# Patient Record
Sex: Female | Born: 1945
Health system: Southern US, Community
[De-identification: ages and names within clinical notes are randomized; demographics above are authoritative.]

## PROBLEM LIST (undated history)

## (undated) DIAGNOSIS — E785 Hyperlipidemia, unspecified: Secondary | ICD-10-CM

## (undated) DIAGNOSIS — J302 Other seasonal allergic rhinitis: Secondary | ICD-10-CM

## (undated) DIAGNOSIS — K219 Gastro-esophageal reflux disease without esophagitis: Secondary | ICD-10-CM

## (undated) DIAGNOSIS — R5383 Other fatigue: Secondary | ICD-10-CM

## (undated) DIAGNOSIS — R519 Headache, unspecified: Secondary | ICD-10-CM

## (undated) DIAGNOSIS — Z87898 Personal history of other specified conditions: Secondary | ICD-10-CM

## (undated) DIAGNOSIS — R51 Headache: Secondary | ICD-10-CM

## (undated) DIAGNOSIS — IMO0001 Reserved for inherently not codable concepts without codable children: Secondary | ICD-10-CM

## (undated) DIAGNOSIS — R197 Diarrhea, unspecified: Secondary | ICD-10-CM

## (undated) DIAGNOSIS — T753XXA Motion sickness, initial encounter: Secondary | ICD-10-CM

## (undated) DIAGNOSIS — I10 Essential (primary) hypertension: Secondary | ICD-10-CM

## (undated) DIAGNOSIS — M1990 Unspecified osteoarthritis, unspecified site: Secondary | ICD-10-CM

## (undated) DIAGNOSIS — C801 Malignant (primary) neoplasm, unspecified: Secondary | ICD-10-CM

## (undated) HISTORY — DX: Personal history of other specified conditions: Z87.898

## (undated) HISTORY — PX: EYE SURGERY: SHX253

## (undated) HISTORY — PX: ABDOMINAL HYSTERECTOMY: SHX81

## (undated) HISTORY — PX: VESICOVAGINAL FISTULA CLOSURE W/ TAH: SUR271

## (undated) HISTORY — PX: BREAST BIOPSY: SHX20

## (undated) HISTORY — PX: CARPAL TUNNEL RELEASE: SHX101

## (undated) HISTORY — DX: Hyperlipidemia, unspecified: E78.5

## (undated) HISTORY — PX: CHOLECYSTECTOMY: SHX55

## (undated) HISTORY — DX: Gastro-esophageal reflux disease without esophagitis: K21.9

## (undated) HISTORY — PX: BREAST EXCISIONAL BIOPSY: SUR124

## (undated) HISTORY — DX: Essential (primary) hypertension: I10

## (undated) HISTORY — PX: SKIN CANCER EXCISION: SHX779

---

## 2004-08-10 ENCOUNTER — Ambulatory Visit: Payer: Self-pay | Admitting: General Surgery

## 2004-09-14 ENCOUNTER — Ambulatory Visit: Payer: Self-pay | Admitting: General Surgery

## 2005-04-01 ENCOUNTER — Ambulatory Visit: Payer: Self-pay | Admitting: General Surgery

## 2006-04-10 ENCOUNTER — Ambulatory Visit: Payer: Self-pay | Admitting: General Surgery

## 2007-04-16 ENCOUNTER — Ambulatory Visit: Payer: Self-pay | Admitting: General Surgery

## 2007-10-12 ENCOUNTER — Ambulatory Visit: Payer: Self-pay | Admitting: General Surgery

## 2008-04-18 ENCOUNTER — Ambulatory Visit: Payer: Self-pay | Admitting: General Surgery

## 2009-04-20 ENCOUNTER — Ambulatory Visit: Payer: Self-pay | Admitting: General Surgery

## 2010-04-23 ENCOUNTER — Ambulatory Visit: Payer: Self-pay | Admitting: General Surgery

## 2010-06-19 ENCOUNTER — Ambulatory Visit: Payer: Self-pay | Admitting: Internal Medicine

## 2010-06-19 ENCOUNTER — Encounter: Payer: Self-pay | Admitting: Cardiovascular Disease

## 2010-08-07 ENCOUNTER — Encounter: Payer: Self-pay | Admitting: Internal Medicine

## 2010-10-02 ENCOUNTER — Encounter: Payer: Self-pay | Admitting: Cardiovascular Disease

## 2010-10-04 ENCOUNTER — Ambulatory Visit: Admission: RE | Admit: 2010-10-04 | Discharge: 2010-10-04 | Payer: Self-pay | Source: Home / Self Care

## 2010-10-04 ENCOUNTER — Ambulatory Visit
Admission: RE | Admit: 2010-10-04 | Discharge: 2010-10-04 | Payer: Self-pay | Source: Home / Self Care | Attending: Cardiovascular Disease | Admitting: Cardiovascular Disease

## 2010-10-04 ENCOUNTER — Encounter: Payer: Self-pay | Admitting: Cardiovascular Disease

## 2010-10-04 DIAGNOSIS — R0600 Dyspnea, unspecified: Secondary | ICD-10-CM | POA: Insufficient documentation

## 2010-10-04 DIAGNOSIS — R42 Dizziness and giddiness: Secondary | ICD-10-CM | POA: Insufficient documentation

## 2010-10-08 ENCOUNTER — Encounter: Payer: Self-pay | Admitting: Cardiovascular Disease

## 2010-10-11 NOTE — Assessment & Plan Note (Signed)
Summary: NP6/AMD   Visit Type:  Initial Consult Primary Provider:  [name].  CC:  c/o seeing spots, shortness of breath, and fluttering in chest and feeling lightheaded.  Denies chest pain.Monica Larson  History of Present Illness: 65 yo WF with history of borderline hyperlipidemia, HTN who is here today for a new patient visit and for planned treadmill stress test. She describes feeling lightheaded. She had two episodes while shopping and walking where she felt dizzy and saw spots. Her BP and blood sugars were ok (friends glucometer). No prior cardiac history. No chest pain or SOB. She denies any orthopnea, PND or lower extremity edema. She tells me that she had a prior echo ten years ago by Dr. Clarene Duke in [place] that was normal. She has had recent normal carotid dopplers on 06/19/10. Labs including CBC, CMET and TSH were recently checked in Dr. Sonny Dandy office.   Preventive Screening-Counseling & Management  Alcohol-Tobacco     Smoking Status: current  Caffeine-Diet-Exercise     Does Patient Exercise: no      Drug Use:  no.    Current Medications (verified): 1)  Detrol La 4 Mg Xr24h-Cap (Tolterodine Tartrate) .... One Tablet Once Daily 2)  Aspir-Low 81 Mg Tbec (Aspirin) .... One Tablet Once Daily 3)  Lisinopril 20 Mg Tabs (Lisinopril) .Monica Larson.. 1 Tablet Once Daily 4)  Venlafaxine Hcl 75 Mg Tabs (Venlafaxine Hcl) .Monica Larson.. 1 Tablet Once Daily 5)  Etodolac 400 Mg Tabs (Etodolac) .Monica Larson.. 1 Tablet Two Times A Day 6)  Nexium 40 Mg Cpdr (Esomeprazole Magnesium) .Monica Larson.. 1 Tablet Once Daily 7)  Benadryl Allergy .Monica Larson.. 1 Tablet Once Daily As Needed  Allergies (verified): 1)  ! Sulfa  Past History:  Family History: Last updated: 10/20/2010 Father: Deceased; DM, CAD/prostate cancer at age 86 Mother: Deceased; ovarian cancer at age 53 Brother : Deceased MI age 77  Social History: Last updated: 10/20/10 Married, 3 children  Part Time-caregiver Tobacco Use - Yes. Occasional (1 cigarette every 3  months) Alcohol Use - yes. No abuse Regular Exercise - no Drug Use - no  Risk Factors: Exercise: no (10/20/10)  Risk Factors: Smoking Status: current (20-Oct-2010)  Past Medical History: Hypertension Asthma Borderlilne hyperlipidemia GERD  Past Surgical History: Eye surgery-muscular as child Cholecystectomy Hysterectomy Carpal tunnel release right wrist  Family History: Father: Deceased; DM, CAD/prostate cancer at age 80 Mother: Deceased; ovarian cancer at age 106 Brother : Deceased MI age 21  Social History: Reviewed history and no changes required. Married, 3 children  Part Time-caregiver Tobacco Use - Yes. Occasional (1 cigarette every 3 months) Alcohol Use - yes. No abuse Regular Exercise - no Drug Use - no Smoking Status:  current Does Patient Exercise:  no Drug Use:  no  Review of Systems       The patient complains of dizziness.  The patient denies fatigue, malaise, fever, weight gain/loss, vision loss, decreased hearing, hoarseness, chest pain, palpitations, shortness of breath, prolonged cough, wheezing, sleep apnea, coughing up blood, abdominal pain, blood in stool, nausea, vomiting, diarrhea, heartburn, incontinence, blood in urine, muscle weakness, joint pain, leg swelling, rash, skin lesions, headache, fainting, depression, anxiety, enlarged lymph nodes, easy bruising or bleeding, and environmental allergies.    Vital Signs:  Patient profile:   65 year old female Height:      67 inches Weight:      183 pounds BMI:     28.77 Pulse rate:   76 / minute BP sitting:   130 / 72  (left arm)  Cuff size:   regular  Vitals Entered By: Bishop Dublin, CMA (October 04, 2010 2:31 PM)  Physical Exam  General:  General: Well developed, well nourished, NAD HEENT: OP clear, mucus membranes moist SKIN: warm, dry Neuro: No focal deficits Musculoskeletal: Muscle strength 5/5 all ext Psychiatric: Mood and affect normal Neck: No JVD, no carotid bruits, no  thyromegaly, no lymphadenopathy. Lungs:Clear bilaterally, no wheezes, rhonci, crackles CV: RRR no murmurs, gallops rubs Abdomen: soft, NT, ND, BS present Extremities: No edema, pulses 2+.    EKG  Procedure date:  10/04/2010  Findings:      NSR, rate 80 bpm.   Exercise Stress Test  Procedure date:  10/04/2010  Findings:      Exercised for 7 minutes achieving 10.1 METS and stopping because of fatigue and dyspnea. No chest pain or ischemic EKG changes. No dizziness.   Impression & Recommendations:  Problem # 1:  DIZZINESS (ICD-780.4) ? etiology. Recent normal carotid doppers. Exercise stress test today without ischemia. Will order echocardiogram to exclude structural heart disease. Will call pt with results. No further ischemic workup at this time. Labs have been recently drawn in Dr. Sonny Dandy office.   Orders: Echocardiogram (Echo)  Patient Instructions: 1)  Your physician has requested that you have an echocardiogram.  Echocardiography is a painless test that uses sound waves to create images of your heart. It provides your doctor with information about the size and shape of your heart and how well your heart's chambers and valves are working.  This procedure takes approximately one hour. There are no restrictions for this procedure. 2)  Your physician recommends that you schedule a follow-up appointment in: as needed 3)  Your physician recommends that you continue on your current medications as directed. Please refer to the Current Medication list given to you today.

## 2010-10-17 NOTE — Letter (Signed)
Summary: [facility]   Imported By: Harlon Flor 10/08/2010 09:00:47  _____________________________________________________________________  External Attachment:    Type:   Image     Comment:   External Document

## 2010-10-18 ENCOUNTER — Other Ambulatory Visit: Payer: Self-pay

## 2011-04-08 ENCOUNTER — Encounter: Payer: Self-pay | Admitting: Cardiovascular Disease

## 2011-04-25 ENCOUNTER — Ambulatory Visit: Payer: Self-pay | Admitting: General Surgery

## 2011-05-14 ENCOUNTER — Encounter: Payer: Self-pay | Admitting: Internal Medicine

## 2011-05-14 ENCOUNTER — Ambulatory Visit (INDEPENDENT_AMBULATORY_CARE_PROVIDER_SITE_OTHER): Payer: Medicare Other | Admitting: Internal Medicine

## 2011-05-14 VITALS — BP 152/94 | HR 75 | Temp 98.5°F | Resp 16 | Ht 67.0 in | Wt 190.0 lb

## 2011-05-14 DIAGNOSIS — G4700 Insomnia, unspecified: Secondary | ICD-10-CM

## 2011-05-14 DIAGNOSIS — I10 Essential (primary) hypertension: Secondary | ICD-10-CM

## 2011-05-14 DIAGNOSIS — R110 Nausea: Secondary | ICD-10-CM

## 2011-05-14 DIAGNOSIS — F32A Depression, unspecified: Secondary | ICD-10-CM

## 2011-05-14 DIAGNOSIS — K219 Gastro-esophageal reflux disease without esophagitis: Secondary | ICD-10-CM

## 2011-05-14 DIAGNOSIS — F329 Major depressive disorder, single episode, unspecified: Secondary | ICD-10-CM

## 2011-05-14 MED ORDER — CITALOPRAM HYDROBROMIDE 20 MG PO TABS: 20.0000 mg | ORAL_TABLET | Freq: Every day | ORAL | Status: DC

## 2011-05-14 MED ORDER — ESOMEPRAZOLE MAGNESIUM 40 MG PO CPDR: 40.0000 mg | DELAYED_RELEASE_CAPSULE | Freq: Every day | ORAL | Status: DC

## 2011-05-14 MED ORDER — ONDANSETRON HCL 4 MG PO TABS: 4.0000 mg | ORAL_TABLET | Freq: Every day | ORAL | Status: AC | PRN

## 2011-05-14 MED ORDER — TRAZODONE HCL 50 MG PO TABS: 50.0000 mg | ORAL_TABLET | Freq: Every day | ORAL | Status: DC

## 2011-05-14 NOTE — Patient Instructions (Signed)
Stop Effexor. Start Celexa 20mg  daily. Use zofran as needed for nausea. Use Trazodone for sleep. RTC 2 weeks. Call sooner if symptoms not improving.

## 2011-05-14 NOTE — Progress Notes (Signed)
Subjective:    Patient ID: Monica Larson, female    DOB: [birth year], 65 y.o.   MRN: [id number]  HPI Ms. Smedley is a 65 year old female with a history of hypertension and depression who presents for an acute visit complaining of intermittent nausea. She reports that the last several months have been extremely stressful for her. Her brother-in-law was killed suddenly in an accident. Since that time she has developed some intermittent episodes of nausea and insomnia. These episodes do not occur with exertion. She denies other symptoms associated with the episodes such as shortness of breath, chest pain, palpitations, diarrhea. She does not associate these episodes with any specific food. She tried stopping her Effexor because she thought this medication might be contributing. She reports no improvement with stopping this medication. She has not had any fever, chills, weight loss. She has no known sick exposures. Recent laboratory reports from July of 2012 show normal blood counts normal kidney and liver function and normal thyroid function.  Outpatient Encounter Prescriptions as of 05/14/2011  Medication Sig Dispense Refill  . aspirin (ASPIR-LOW) 81 MG EC tablet Take 81 mg by mouth daily.        . Calcium Carbonate-Vitamin D (CALCIUM + D PO) Take 1 tablet by mouth daily.        . DiphenhydrAMINE HCl (BENADRYL ALLERGY PO) Take 1 tablet by mouth daily as needed.        Marland Kitchen esomeprazole (NEXIUM) 40 MG capsule Take 1 capsule (40 mg total) by mouth daily.  90 capsule  4  . etodolac (LODINE) 400 MG tablet Take 400 mg by mouth 2 (two) times daily.        Marland Kitchen lisinopril (PRINIVIL,ZESTRIL) 20 MG tablet Take 20 mg by mouth daily.        . VESICARE 5 MG tablet       . citalopram (CELEXA) 20 MG tablet Take 1 tablet (20 mg total) by mouth daily.  30 tablet  2  . ondansetron (ZOFRAN) 4 MG tablet Take 1 tablet (4 mg total) by mouth daily as needed for nausea.  30 tablet  1  . traZODone (DESYREL) 50 MG tablet Take 1 tablet (50  mg total) by mouth at bedtime.  30 tablet  11    Review of Systems  Constitutional: Positive for fatigue. Negative for fever, chills, appetite change and unexpected weight change.  HENT: Negative for ear pain, congestion, sore throat, trouble swallowing, neck pain, voice change and sinus pressure.   Eyes: Negative for visual disturbance.  Respiratory: Negative for cough, shortness of breath, wheezing and stridor.   Cardiovascular: Negative for chest pain, palpitations and leg swelling.  Gastrointestinal: Positive for nausea. Negative for vomiting, abdominal pain, diarrhea, constipation, blood in stool, abdominal distention and anal bleeding.  Genitourinary: Negative for dysuria and flank pain.  Musculoskeletal: Negative for myalgias, arthralgias and gait problem.  Skin: Negative for color change and rash.  Neurological: Positive for light-headedness. Negative for dizziness and headaches.  Hematological: Negative for adenopathy. Does not bruise/bleed easily.  Psychiatric/Behavioral: Positive for dysphoric mood. Negative for suicidal ideas and sleep disturbance. The patient is nervous/anxious.    BP 152/94  Pulse 75  Temp(Src) 98.5 F (36.9 C) (Oral)  Resp 16  Ht 5\' 7"  (1.702 m)  Wt 190 lb (86.183 kg)  BMI 29.76 kg/m2  SpO2 96%     Objective:   Physical Exam  Constitutional: She is oriented to person, place, and time. She appears well-developed and well-nourished. No distress.  HENT:  Head: Normocephalic and atraumatic.  Right Ear: External ear normal.  Left Ear: External ear normal.  Nose: Nose normal.  Mouth/Throat: Oropharynx is clear and moist. No oropharyngeal exudate.  Eyes: Conjunctivae are normal. Pupils are equal, round, and reactive to light. Right eye exhibits no discharge. Left eye exhibits no discharge. No scleral icterus.  Neck: Normal range of motion. Neck supple. No tracheal deviation present. No thyromegaly present.  Cardiovascular: Normal rate, regular rhythm,  normal heart sounds and intact distal pulses.  Exam reveals no gallop and no friction rub.   No murmur heard. Pulmonary/Chest: Effort normal and breath sounds normal. No respiratory distress. She has no wheezes. She has no rales. She exhibits no tenderness.  Abdominal: Soft. Bowel sounds are normal. She exhibits no distension and no mass. There is no tenderness. There is no rebound and no guarding.  Musculoskeletal: Normal range of motion. She exhibits no edema and no tenderness.  Lymphadenopathy:    She has no cervical adenopathy.  Neurological: She is alert and oriented to person, place, and time. No cranial nerve deficit. She exhibits normal muscle tone. Coordination normal.  Skin: Skin is warm and dry. No rash noted. She is not diaphoretic. No erythema. No pallor.  Psychiatric: Her speech is normal and behavior is normal. Judgment and thought content normal. Cognition and memory are normal. She exhibits a depressed mood.          Assessment & Plan:  1. Nausea - suspect nausea is related to increased anxiety. As below, we will try changing her Effexor to citalopram. We discussed other potential etiologies including cardiac ischemia. This seems unlikely given the absence of other symptoms and the absence of symptoms on exertion. However, I recommended a baseline EKG. I also recommended getting repeat lab work including CBC, CMP, TSH, and screen for H. pylori. She would prefer to defer her lab work for one week. Should her symptoms not improve with change in medication she will return to clinic and have EKG and labs performed. She will also stop her etodolac for the next week to see if there is any improvement in her symptoms.  2. Depression - patient with depression and anxiety which is worsened recently with the sudden death of her brother-in-law. We will try changing her Effexor to Celexa. She will call if any problems with this medication. Otherwise she will return to clinic in one  month.  3. Insomnia - Patient reports recent insomnia after sudden death of her brother-in-law. She has used Ambien in the past and had nightmares with this. We'll try using trazodone 50 mg at bedtime. She will call if any problems with his medication. Otherwise, she'll return to clinic in one month.  4. Hypertension - Patient with hypertension currently on lisinopril. Blood pressure is elevated today, however patient tearful on examination. We will repeat evaluation blood pressure in one month. She will call if BP consistently >140/90 at home.

## 2011-05-22 ENCOUNTER — Encounter: Payer: Self-pay | Admitting: Internal Medicine

## 2011-05-28 ENCOUNTER — Ambulatory Visit: Payer: Medicare Other | Admitting: Internal Medicine

## 2011-06-03 ENCOUNTER — Ambulatory Visit (INDEPENDENT_AMBULATORY_CARE_PROVIDER_SITE_OTHER): Payer: Medicare Other | Admitting: Internal Medicine

## 2011-06-03 ENCOUNTER — Encounter: Payer: Self-pay | Admitting: Internal Medicine

## 2011-06-03 VITALS — BP 133/77 | HR 69 | Temp 97.3°F | Resp 16 | Ht 67.0 in | Wt 190.0 lb

## 2011-06-03 DIAGNOSIS — R110 Nausea: Secondary | ICD-10-CM

## 2011-06-03 DIAGNOSIS — Z23 Encounter for immunization: Secondary | ICD-10-CM

## 2011-06-03 DIAGNOSIS — K219 Gastro-esophageal reflux disease without esophagitis: Secondary | ICD-10-CM

## 2011-06-03 DIAGNOSIS — F329 Major depressive disorder, single episode, unspecified: Secondary | ICD-10-CM

## 2011-06-03 MED ORDER — CITALOPRAM HYDROBROMIDE 20 MG PO TABS: 20.0000 mg | ORAL_TABLET | Freq: Every day | ORAL | Status: DC

## 2011-06-03 MED ORDER — ESOMEPRAZOLE MAGNESIUM 40 MG PO CPDR: 40.0000 mg | DELAYED_RELEASE_CAPSULE | Freq: Every day | ORAL | Status: DC

## 2011-06-03 NOTE — Progress Notes (Signed)
Subjective:    Patient ID: Monica Larson, female    DOB: 11-29-1945, 65 y.o.   MRN: [id number]  HPI Ms. Espiritu is a 65 year old female with a history of depression and acid reflux who follows up after a recent episode of intermittent nausea. At her last visit we changed her Effexor to Celexa to see if there would be any improvement in both her depression symptoms and nausea. We also started Nexium to help with acid reflux. She reports significant improvement in her symptoms and reports no further symptoms of abdominal pain or nausea. She reports that her depressive symptoms have been well-controlled. She denies any complaints today. She reports a normal activity level and by mouth intake. She denies any fever or chills. She denies any chest pain.  At her last visit she also complained of insomnia. We started trazodone and she reports significant improvement on this medication. She continues to take only 50 mg nightly. She denies any morning drowsiness. She denies any side effects from the medication.  meds as of 06/03/2011  Medication Sig Dispense Refill  . aspirin (ASPIR-LOW) 81 MG EC tablet Take 81 mg by mouth daily.        . Calcium Carbonate-Vitamin D (CALCIUM + D PO) Take 1 tablet by mouth daily.        . citalopram (CELEXA) 20 MG tablet Take 1 tablet (20 mg total) by mouth daily.  90 tablet  4  . DiphenhydrAMINE HCl (BENADRYL ALLERGY PO) Take 1 tablet by mouth daily as needed.        Marland Kitchen esomeprazole (NEXIUM) 40 MG capsule Take 1 capsule (40 mg total) by mouth daily.  90 capsule  4  . etodolac (LODINE) 400 MG tablet Take 400 mg by mouth 2 (two) times daily.        Marland Kitchen lisinopril (PRINIVIL,ZESTRIL) 20 MG tablet Take 20 mg by mouth daily.        . ondansetron (ZOFRAN) 4 MG tablet Take 1 tablet (4 mg total) by mouth daily as needed for nausea.  30 tablet  1  . traZODone (DESYREL) 50 MG tablet Take 1 tablet (50 mg total) by mouth at bedtime.  30 tablet  11  . VESICARE 5 MG tablet         Review of  Systems  Constitutional: Negative for fever, chills, appetite change, fatigue and unexpected weight change.  HENT: Negative for ear pain, congestion, sore throat, trouble swallowing, neck pain, voice change and sinus pressure.   Eyes: Negative for visual disturbance.  Respiratory: Negative for cough, shortness of breath, wheezing and stridor.   Cardiovascular: Negative for chest pain, palpitations and leg swelling.  Gastrointestinal: Negative for nausea, vomiting, abdominal pain, diarrhea, constipation, blood in stool, abdominal distention and anal bleeding.  Genitourinary: Negative for dysuria and flank pain.  Musculoskeletal: Negative for myalgias, arthralgias and gait problem.  Skin: Negative for color change and rash.  Neurological: Negative for dizziness and headaches.  Hematological: Negative for adenopathy. Does not bruise/bleed easily.  Psychiatric/Behavioral: Negative for suicidal ideas, sleep disturbance and dysphoric mood. The patient is not nervous/anxious.    BP 133/77  Pulse 69  Temp(Src) 97.3 F (36.3 C) (Oral)  Resp 16  Ht 5\' 7"  (1.702 m)  Wt 190 lb (86.183 kg)  BMI 29.76 kg/m2  SpO2 98%     Objective:   Physical Exam  Constitutional: She is oriented to person, place, and time. She appears well-developed and well-nourished. No distress.  HENT:  Head: Normocephalic and  atraumatic.  Right Ear: External ear normal.  Left Ear: External ear normal.  Nose: Nose normal.  Mouth/Throat: Oropharynx is clear and moist. No oropharyngeal exudate.  Eyes: Conjunctivae are normal. Pupils are equal, round, and reactive to light. Right eye exhibits no discharge. Left eye exhibits no discharge. No scleral icterus.  Neck: Normal range of motion. Neck supple. No tracheal deviation present. No thyromegaly present.  Cardiovascular: Normal rate, regular rhythm, normal heart sounds and intact distal pulses.  Exam reveals no gallop and no friction rub.   No murmur heard. Pulmonary/Chest:  Effort normal and breath sounds normal. No respiratory distress. She has no wheezes. She has no rales. She exhibits no tenderness.  Abdominal: Soft. She exhibits no distension and no mass. There is no tenderness. There is no guarding.  Musculoskeletal: Normal range of motion. She exhibits no edema and no tenderness.  Lymphadenopathy:    She has no cervical adenopathy.  Neurological: She is alert and oriented to person, place, and time. No cranial nerve deficit. She exhibits normal muscle tone. Coordination normal.  Skin: Skin is warm and dry. No rash noted. She is not diaphoretic. No erythema. No pallor.  Psychiatric: She has a normal mood and affect. Her behavior is normal. Judgment and thought content normal.          Assessment & Plan:  1. Nausea - pt with a recent episode of intermittent nausea which is now resolved. We'll continue Nexium as needed. She will followup in 6 months.  2. Depression - patient with depression. Was recently changed from Effexor to Celexa. Symptoms are well controlled without side effects at this time. She will continue this medication and followup in 6 months or earlier if needed.  3. Insomnia - patient reports that insomnia is well controlled on trazodone. We'll plan to continue this medication. We again discussed that the dose may be increased should her symptoms recur. She will followup in 6 months or as needed.

## 2011-08-15 ENCOUNTER — Ambulatory Visit (INDEPENDENT_AMBULATORY_CARE_PROVIDER_SITE_OTHER): Payer: Medicare Other | Admitting: Internal Medicine

## 2011-08-15 ENCOUNTER — Encounter: Payer: Self-pay | Admitting: Internal Medicine

## 2011-08-15 VITALS — BP 169/75 | HR 71 | Temp 97.7°F | Ht 67.5 in | Wt 197.0 lb

## 2011-08-15 DIAGNOSIS — R32 Unspecified urinary incontinence: Secondary | ICD-10-CM

## 2011-08-15 DIAGNOSIS — Z0000 Encounter for general adult medical examination without abnormal findings: Secondary | ICD-10-CM

## 2011-08-15 DIAGNOSIS — J40 Bronchitis, not specified as acute or chronic: Secondary | ICD-10-CM

## 2011-08-15 MED ORDER — PREDNISONE (PAK) 10 MG PO TABS: ORAL_TABLET | ORAL | Status: AC

## 2011-08-15 MED ORDER — DOXYCYCLINE HYCLATE 100 MG PO CAPS: 100.0000 mg | ORAL_CAPSULE | Freq: Two times a day (BID) | ORAL | Status: AC

## 2011-08-15 MED ORDER — GUAIFENESIN-CODEINE 100-10 MG/5ML PO SYRP: 5.0000 mL | ORAL_SOLUTION | Freq: Two times a day (BID) | ORAL | Status: DC | PRN

## 2011-08-15 MED ORDER — SOLIFENACIN SUCCINATE 5 MG PO TABS: 10.0000 mg | ORAL_TABLET | Freq: Every day | ORAL | Status: DC

## 2011-08-15 NOTE — Progress Notes (Signed)
Subjective:    Patient ID: Monica Larson, female    DOB: 1946/08/26, 65 y.o.   MRN: [id number]  HPI 65 year old female with a history of hypertension and urinary incontinence presents for an acute visit. She has several concerns today. First, she reports a two-week history of cough which is productive of thick green sputum. She also describes some chest tightness with her cough. She reports some shortness of breath particularly at night. She also notes some nasal congestion and bilateral ear pain. She denies any recent fever or chills. She notes that her husband was sick with similar symptoms. She's been using over-the-counter cough and cold medicines with no improvement.  She is also concerned today about her potential exposure to sexual transmitted diseases. She notes that she recently separated from her husband. She is interested in testing for STDs. She denies any vaginal discharge or pain. She reports that her husband was her own sexual partner.  She is also concerned today about persistent urinary incontinence. This has been made worse by recent coughing. She has been taking VESIcare with no improvement. She describes the incontinence as urge incontinence. She reports that she has difficulty making it to the bathroom when she has the urge to urinate. She denies any fever, chills, flank pain, dysuria.  Outpatient Encounter Prescriptions as of 08/15/2011  Medication Sig Dispense Refill  . aspirin (ASPIR-LOW) 81 MG EC tablet Take 81 mg by mouth daily.        . Calcium Carbonate-Vitamin D (CALCIUM + D PO) Take 1 tablet by mouth daily.        . citalopram (CELEXA) 20 MG tablet Take 1 tablet (20 mg total) by mouth daily.  90 tablet  4  . DiphenhydrAMINE HCl (BENADRYL ALLERGY PO) Take 1 tablet by mouth daily as needed.        Marland Kitchen esomeprazole (NEXIUM) 40 MG capsule Take 1 capsule (40 mg total) by mouth daily.  90 capsule  4  . etodolac (LODINE) 400 MG tablet Take 400 mg by mouth 2 (two) times daily.          Marland Kitchen lisinopril (PRINIVIL,ZESTRIL) 20 MG tablet Take 20 mg by mouth daily.        . ondansetron (ZOFRAN) 4 MG tablet Take 1 tablet (4 mg total) by mouth daily as needed for nausea.  30 tablet  1    Review of Systems  Constitutional: Negative for fever, chills and unexpected weight change.  HENT: Positive for ear pain, congestion, sore throat, rhinorrhea, postnasal drip and sinus pressure. Negative for hearing loss, nosebleeds, facial swelling, sneezing, mouth sores, trouble swallowing, neck pain, neck stiffness, voice change, tinnitus and ear discharge.   Eyes: Negative for pain, discharge, redness and visual disturbance.  Respiratory: Positive for cough and shortness of breath. Negative for chest tightness, wheezing and stridor.   Cardiovascular: Negative for chest pain, palpitations and leg swelling.  Gastrointestinal: Negative for abdominal pain.  Genitourinary: Negative for vaginal bleeding, vaginal discharge and vaginal pain.  Musculoskeletal: Negative for myalgias and arthralgias.  Skin: Negative for color change and rash.  Neurological: Positive for headaches. Negative for dizziness, weakness and light-headedness.  Hematological: Negative for adenopathy.   BP 169/75  Pulse 71  Temp 97.7 F (36.5 C)  Ht 5' 7.5" (1.715 m)  Wt 197 lb (89.359 kg)  BMI 30.40 kg/m2  SpO2 97%    Objective:   Physical Exam  Constitutional: She is oriented to person, place, and time. She appears well-developed and well-nourished. No distress.  HENT:  Head: Normocephalic and atraumatic.  Right Ear: External ear normal. A middle ear effusion is present.  Left Ear: External ear normal. A middle ear effusion is present.  Nose: Nose normal.  Mouth/Throat: Oropharynx is clear and moist. No oropharyngeal exudate.  Eyes: Conjunctivae are normal. Pupils are equal, round, and reactive to light. Right eye exhibits no discharge. Left eye exhibits no discharge. No scleral icterus.  Neck: Normal range of motion.  Neck supple. No tracheal deviation present. No thyromegaly present.  Cardiovascular: Normal rate, regular rhythm, normal heart sounds and intact distal pulses.  Exam reveals no gallop and no friction rub.   No murmur heard. Pulmonary/Chest: Effort normal. No accessory muscle usage. Not tachypneic. No respiratory distress. She has decreased breath sounds (prolonged expiratory phase). She has no wheezes. She has rhonchi in the right lower field. She has no rales. She exhibits no tenderness.  Musculoskeletal: Normal range of motion. She exhibits no edema and no tenderness.  Lymphadenopathy:    She has no cervical adenopathy.  Neurological: She is alert and oriented to person, place, and time. No cranial nerve deficit. She exhibits normal muscle tone. Coordination normal.  Skin: Skin is warm and dry. No rash noted. She is not diaphoretic. No erythema. No pallor.  Psychiatric: She has a normal mood and affect. Her behavior is normal. Judgment and thought content normal.          Assessment & Plan:  1. Bronchitis -symptoms and exam are consistent with bronchitis. Will treat with doxycycline and a prednisone taper pack. Will use guaifenesin and codeine for cough. She will call or return to clinic if symptoms are not improving.  2. Sexually transmitted disease testing - will send blood work for HIV, syphilis, and hepatitis C today. Will send urine for GC and Chlamydia.  3. Urinary incontinence - will increase dose of VESIcare to 10 mg daily. Patient will return to clinic for followup in one month.

## 2011-08-15 NOTE — Patient Instructions (Signed)
Please check blood pressure 1-2 times per week. Call if consistently >140/90.

## 2011-08-16 LAB — RPR

## 2011-08-16 LAB — HIV ANTIBODY (ROUTINE TESTING W REFLEX): HIV: NONREACTIVE

## 2011-08-16 LAB — HEPATITIS C ANTIBODY: HCV Ab: NEGATIVE

## 2011-08-16 NOTE — Progress Notes (Signed)
Addended by: Jobie Quaker on: 08/16/2011 08:24 AM   Modules accepted: Orders

## 2011-09-17 ENCOUNTER — Telehealth: Payer: Self-pay | Admitting: Internal Medicine

## 2011-09-17 NOTE — Telephone Encounter (Signed)
patient needs her medication called into her new pharmacy which is [facility], [phone] is the phone number, and their fax number is [phone].  She also had a question about her Vesicare she thought Dr. Dan Humphreys changed the dosage.  She doesn't need all of her medications at this time she only needs the vesicare and please put hold on the other medication

## 2011-09-18 MED ORDER — SOLIFENACIN SUCCINATE 10 MG PO TABS: 10.0000 mg | ORAL_TABLET | Freq: Every day | ORAL | Status: DC

## 2011-09-18 NOTE — Telephone Encounter (Signed)
Spoke with patient, she must use [facility] for RX's. Vesicare was increased at last OV to 10 mg, RF sent in

## 2011-11-13 ENCOUNTER — Ambulatory Visit: Payer: Medicare Other | Admitting: Internal Medicine

## 2011-11-13 DIAGNOSIS — Z0289 Encounter for other administrative examinations: Secondary | ICD-10-CM

## 2011-11-15 ENCOUNTER — Telehealth: Payer: Self-pay | Admitting: *Deleted

## 2011-11-15 DIAGNOSIS — K219 Gastro-esophageal reflux disease without esophagitis: Secondary | ICD-10-CM

## 2011-11-15 DIAGNOSIS — F329 Major depressive disorder, single episode, unspecified: Secondary | ICD-10-CM

## 2011-11-15 MED ORDER — LISINOPRIL 20 MG PO TABS: 20.0000 mg | ORAL_TABLET | Freq: Every day | ORAL | Status: DC

## 2011-11-15 MED ORDER — ESOMEPRAZOLE MAGNESIUM 40 MG PO CPDR: 40.0000 mg | DELAYED_RELEASE_CAPSULE | Freq: Every day | ORAL | Status: DC

## 2011-11-15 MED ORDER — SOLIFENACIN SUCCINATE 10 MG PO TABS: 10.0000 mg | ORAL_TABLET | Freq: Every day | ORAL | Status: DC

## 2011-11-15 MED ORDER — CITALOPRAM HYDROBROMIDE 20 MG PO TABS: 20.0000 mg | ORAL_TABLET | Freq: Every day | ORAL | Status: DC

## 2011-11-15 MED ORDER — ETODOLAC 400 MG PO TABS: 400.0000 mg | ORAL_TABLET | Freq: Two times a day (BID) | ORAL | Status: DC

## 2011-11-15 NOTE — Telephone Encounter (Signed)
Pt left VM - Mail order company did not get RFs per pt, copied and pasted from phone note (original request) has fax info. Will need to print RX's, have MD sign and fax into pharm. Pt needs a call Monday to confirm this was done in January when she first called AND that we did it again.   Lamar Sprinkles, CMA 09/18/2011 11:50 AM Signed  Spoke with patient, she must use [facility] for [facility]'s. Vesicare was increased at last OV to 10 mg, RF sent in Lysbeth Galas 09/17/2011 1:30 PM Signed  patient needs her medication called into her new pharmacy which is [facility], [phone] is the phone number, and their fax number is [phone]. She also had a question about her Vesicare she thought Dr. Dan Humphreys changed the dosage. She doesn't need all of her medications at this time she only needs the vesicare and please put hold on the other medication

## 2011-11-18 ENCOUNTER — Encounter: Payer: Self-pay | Admitting: Internal Medicine

## 2011-11-18 ENCOUNTER — Ambulatory Visit (INDEPENDENT_AMBULATORY_CARE_PROVIDER_SITE_OTHER): Payer: Self-pay | Admitting: Internal Medicine

## 2011-11-18 ENCOUNTER — Telehealth: Payer: Self-pay | Admitting: *Deleted

## 2011-11-18 VITALS — BP 118/58 | HR 87 | Temp 97.5°F | Ht 67.5 in | Wt 193.0 lb

## 2011-11-18 DIAGNOSIS — N390 Urinary tract infection, site not specified: Secondary | ICD-10-CM | POA: Insufficient documentation

## 2011-11-18 LAB — POCT URINALYSIS DIPSTICK
Glucose, UA: NEGATIVE
Spec Grav, UA: <=1.005
Urobilinogen, UA: 0.2

## 2011-11-18 MED ORDER — CIPROFLOXACIN HCL 500 MG PO TABS: 500.0000 mg | ORAL_TABLET | Freq: Two times a day (BID) | ORAL | Status: AC

## 2011-11-18 NOTE — Progress Notes (Signed)
  Subjective:    Patient ID: Monica Larson, female    DOB: [birth year], 66 y.o.   MRN: [id number]  HPI 66 year old female presents for acute visit complaining of several days of increased urinary frequency. Patient denies fever, chills, flank pain.  Outpatient Encounter Prescriptions as of 11/18/2011  Medication Sig Dispense Refill  . aspirin (ASPIR-LOW) 81 MG EC tablet Take 81 mg by mouth daily.        . Calcium Carbonate-Vitamin D (CALCIUM + D PO) Take 1 tablet by mouth daily.        . citalopram (CELEXA) 20 MG tablet Take 1 tablet (20 mg total) by mouth daily.  90 tablet  3  . DiphenhydrAMINE HCl (BENADRYL ALLERGY PO) Take 1 tablet by mouth daily as needed.        Marland Kitchen esomeprazole (NEXIUM) 40 MG capsule Take 1 capsule (40 mg total) by mouth daily.  90 capsule  3  . etodolac (LODINE) 400 MG tablet Take 1 tablet (400 mg total) by mouth 2 (two) times daily.  180 tablet  1  . guaiFENesin-codeine (ROBITUSSIN AC) 100-10 MG/5ML syrup Take 5 mLs by mouth 2 (two) times daily as needed for cough.  240 mL  0  . lisinopril (PRINIVIL,ZESTRIL) 20 MG tablet Take 1 tablet (20 mg total) by mouth daily.  90 tablet  2  . ondansetron (ZOFRAN) 4 MG tablet Take 1 tablet (4 mg total) by mouth daily as needed for nausea.  30 tablet  1  . solifenacin (VESICARE) 10 MG tablet Take 1 tablet (10 mg total) by mouth daily.  90 tablet  3  . traZODone (DESYREL) 50 MG tablet       . ciprofloxacin (CIPRO) 500 MG tablet Take 1 tablet (500 mg total) by mouth 2 (two) times daily.  14 tablet  0    Review of Systems  Constitutional: Negative for fever and chills.  Genitourinary: Positive for dysuria, urgency and frequency. Negative for flank pain.   BP 118/58  Pulse 87  Temp(Src) 97.5 F (36.4 C) (Oral)  Ht 5' 7.5" (1.715 m)  Wt 193 lb (87.544 kg)  BMI 29.78 kg/m2  SpO2 94%     Objective:   Physical Exam  Constitutional: She is oriented to person, place, and time. She appears well-developed and well-nourished. No distress.    HENT:  Head: Normocephalic and atraumatic.  Eyes: EOM are normal. Pupils are equal, round, and reactive to light.  Neck: Normal range of motion. Neck supple.  Pulmonary/Chest: Effort normal.  Neurological: She is alert and oriented to person, place, and time. She has normal reflexes. No cranial nerve deficit.  Skin: Skin is warm and dry. She is not diaphoretic.  Psychiatric: She has a normal mood and affect. Her behavior is normal. Judgment normal.          Assessment & Plan:

## 2011-11-18 NOTE — Assessment & Plan Note (Signed)
Will send urine for culture. Will treat with Cipro twice daily x7 days. Followup if symptoms are not improving.

## 2011-11-18 NOTE — Telephone Encounter (Signed)
[phone] To: [facility] (Daytime Triage) Fax: [phone] From: [facility] Date/ Time: 11/18/2011 1:02 PM Taken By: Neita Goodnight, RN Caller: Clotilde Dieter Facility: Not Collected Patient: Monica Larson DOB: 12-Mar-1946 Phone: [phone] Reason for Call: Caller was unable to be reached on callback - Left Message Regarding Appointment: No Appt Date: Appt Time: Unknown Provider: Reason: Details: Outcome:

## 2011-11-18 NOTE — Progress Notes (Signed)
Addended by: Vernie Murders on: 11/18/2011 02:59 PM   Modules accepted: Orders

## 2011-11-20 LAB — CULTURE, URINE COMPREHENSIVE: Colony Count: >=100000

## 2011-11-21 NOTE — Telephone Encounter (Signed)
Spoke w/pt, RFs were faxed in, she is aware

## 2012-06-02 ENCOUNTER — Other Ambulatory Visit: Payer: Self-pay | Admitting: *Deleted

## 2012-06-02 MED ORDER — SCOPOLAMINE 1 MG/3DAYS TD PT72: 1.0000 | MEDICATED_PATCH | TRANSDERMAL | Status: DC

## 2012-06-02 NOTE — Telephone Encounter (Signed)
Patient is requesting a Rx for the patches that go behind the ear for motion sickness, she will be leaving for vacation soon.  Uses [facility].

## 2012-06-02 NOTE — Telephone Encounter (Signed)
There are over the counter patches or we can call in Scopolamine 1.5mg  patch applied behind the ear and changed every 72hours. Disp 10 no refill

## 2012-06-22 ENCOUNTER — Encounter: Payer: Self-pay | Admitting: Internal Medicine

## 2012-06-22 ENCOUNTER — Ambulatory Visit (INDEPENDENT_AMBULATORY_CARE_PROVIDER_SITE_OTHER): Payer: Medicare Other | Admitting: Internal Medicine

## 2012-06-22 VITALS — BP 130/80 | HR 76 | Temp 97.5°F | Ht 67.5 in | Wt 195.2 lb

## 2012-06-22 DIAGNOSIS — Z1239 Encounter for other screening for malignant neoplasm of breast: Secondary | ICD-10-CM | POA: Insufficient documentation

## 2012-06-22 DIAGNOSIS — G4700 Insomnia, unspecified: Secondary | ICD-10-CM

## 2012-06-22 DIAGNOSIS — H6690 Otitis media, unspecified, unspecified ear: Secondary | ICD-10-CM

## 2012-06-22 DIAGNOSIS — I10 Essential (primary) hypertension: Secondary | ICD-10-CM

## 2012-06-22 DIAGNOSIS — H6691 Otitis media, unspecified, right ear: Secondary | ICD-10-CM

## 2012-06-22 DIAGNOSIS — J40 Bronchitis, not specified as acute or chronic: Secondary | ICD-10-CM | POA: Insufficient documentation

## 2012-06-22 MED ORDER — TRAZODONE HCL 50 MG PO TABS: 50.0000 mg | ORAL_TABLET | Freq: Every day | ORAL | Status: DC

## 2012-06-22 MED ORDER — LEVOFLOXACIN 500 MG PO TABS: 500.0000 mg | ORAL_TABLET | Freq: Every day | ORAL | Status: DC

## 2012-06-22 MED ORDER — CYANOCOBALAMIN 1000 MCG/ML IJ SOLN: 1000.0000 ug | INTRAMUSCULAR | Status: DC

## 2012-06-22 MED ORDER — HYDROCOD POLST-CHLORPHEN POLST 10-8 MG/5ML PO LQCR: 5.0000 mL | Freq: Two times a day (BID) | ORAL | Status: DC | PRN

## 2012-06-22 MED ORDER — PREDNISONE (PAK) 10 MG PO TABS: ORAL_TABLET | ORAL | Status: DC

## 2012-06-22 NOTE — Assessment & Plan Note (Addendum)
Symptoms are consistent with bronchitis. Will treat with Levaquin and prednisone taper. Patient will call if symptoms are not improving over the next 48 hours. If no improvement, would favor getting chest x-ray for further evaluation. Encourage smoking cessation. Followup in 4 weeks.

## 2012-06-22 NOTE — Progress Notes (Signed)
Subjective:    Patient ID: Monica Larson, female    DOB: 05/23/1946, 66 y.o.   MRN: [id number]  HPI 66 year old female with history of tobacco abuse presents for acute visit complaining of 2 week history of sinus congestion, nonproductive cough, and shortness of breath. She denies any fever or chills. She denies chest pain. She has not been taking any medication for symptoms.  Outpatient Encounter Prescriptions as of 06/22/2012  Medication Sig Dispense Refill  . aspirin (ASPIR-LOW) 81 MG EC tablet Take 81 mg by mouth daily.        . citalopram (CELEXA) 20 MG tablet Take 1 tablet (20 mg total) by mouth daily.  90 tablet  3  . cyanocobalamin (,VITAMIN B-12,) 1000 MCG/ML injection Inject 1 mL (1,000 mcg total) into the muscle every 30 (thirty) days.  10 mL  6  . DiphenhydrAMINE HCl (BENADRYL ALLERGY PO) Take 1 tablet by mouth daily as needed.        . etodolac (LODINE) 400 MG tablet Take 1 tablet (400 mg total) by mouth 2 (two) times daily.  180 tablet  1  . lisinopril (PRINIVIL,ZESTRIL) 20 MG tablet Take 1 tablet (20 mg total) by mouth daily.  90 tablet  2  . solifenacin (VESICARE) 10 MG tablet Take 1 tablet (10 mg total) by mouth daily.  90 tablet  3  . traZODone (DESYREL) 50 MG tablet Take 1 tablet (50 mg total) by mouth at bedtime.  30 tablet  6  . Calcium Carbonate-Vitamin D (CALCIUM + D PO) Take 1 tablet by mouth daily.        . chlorpheniramine-HYDROcodone (TUSSIONEX) 10-8 MG/5ML LQCR Take 5 mLs by mouth every 12 (twelve) hours as needed.  140 mL  0  . esomeprazole (NEXIUM) 40 MG capsule Take 1 capsule (40 mg total) by mouth daily.  90 capsule  3   BP 130/80  Pulse 76  Temp 97.5 F (36.4 C) (Oral)  Ht 5' 7.5" (1.715 m)  Wt 195 lb 4 oz (88.565 kg)  BMI 30.13 kg/m2  SpO2 98%  Review of Systems  Constitutional: Positive for fatigue. Negative for fever, chills and unexpected weight change.  HENT: Positive for ear pain, congestion and sinus pressure. Negative for hearing loss, nosebleeds,  sore throat, facial swelling, rhinorrhea, sneezing, mouth sores, trouble swallowing, neck pain, neck stiffness, voice change, postnasal drip, tinnitus and ear discharge.   Eyes: Negative for pain, discharge, redness and visual disturbance.  Respiratory: Positive for cough and shortness of breath. Negative for chest tightness, wheezing and stridor.   Cardiovascular: Negative for chest pain, palpitations and leg swelling.  Musculoskeletal: Negative for myalgias and arthralgias.  Skin: Negative for color change and rash.  Neurological: Negative for dizziness, weakness, light-headedness and headaches.  Hematological: Negative for adenopathy.       Objective:   Physical Exam  Constitutional: She is oriented to person, place, and time. She appears well-developed and well-nourished. No distress.  HENT:  Head: Normocephalic and atraumatic.  Right Ear: External ear normal. Tympanic membrane is erythematous and bulging. A middle ear effusion is present.  Left Ear: External ear normal. Tympanic membrane is not erythematous.  No middle ear effusion.  Nose: Nose normal.  Mouth/Throat: Oropharynx is clear and moist. No oropharyngeal exudate.  Eyes: Conjunctivae normal are normal. Pupils are equal, round, and reactive to light. Right eye exhibits no discharge. Left eye exhibits no discharge. No scleral icterus.  Neck: Normal range of motion. Neck supple. No tracheal deviation present. No thyromegaly  present.  Cardiovascular: Normal rate, regular rhythm, normal heart sounds and intact distal pulses.  Exam reveals no gallop and no friction rub.   No murmur heard. Pulmonary/Chest: Effort normal. No accessory muscle usage. Not tachypneic. No respiratory distress. She has decreased breath sounds. She has wheezes. She has no rhonchi. She has no rales. She exhibits no tenderness.  Musculoskeletal: Normal range of motion. She exhibits no edema and no tenderness.  Lymphadenopathy:    She has no cervical  adenopathy.  Neurological: She is alert and oriented to person, place, and time. No cranial nerve deficit. She exhibits normal muscle tone. Coordination normal.  Skin: Skin is warm and dry. No rash noted. She is not diaphoretic. No erythema. No pallor.  Psychiatric: She has a normal mood and affect. Her behavior is normal. Judgment and thought content normal.          Assessment & Plan:

## 2012-06-22 NOTE — Assessment & Plan Note (Signed)
Exam is also remarkable for a right otitis media. As above, will treat with Levaquin and prednisone taper. Patient will call if symptoms are not improving.

## 2012-06-22 NOTE — Assessment & Plan Note (Signed)
BP slightly elevated today. Will continue to monitor. Pt will follow up in 4 weeks for recheck.

## 2012-06-22 NOTE — Assessment & Plan Note (Signed)
Screening mammogram to be scheduled today.

## 2012-06-23 ENCOUNTER — Ambulatory Visit: Payer: Self-pay | Admitting: Internal Medicine

## 2012-06-24 ENCOUNTER — Telehealth: Payer: Self-pay | Admitting: *Deleted

## 2012-06-24 MED ORDER — HYDROCODONE-ACETAMINOPHEN 5-500 MG PO TABS: 1.0000 | ORAL_TABLET | Freq: Two times a day (BID) | ORAL | Status: DC | PRN

## 2012-06-24 NOTE — Telephone Encounter (Signed)
We can call in Vicodin 5-500mg  1 tab po bid prn coughing, disp #30. No refill.

## 2012-06-24 NOTE — Telephone Encounter (Signed)
Rx phoned into [facility] for Vicodin.

## 2012-06-24 NOTE — Telephone Encounter (Signed)
Received a call from pharmacy stating that Tussionex is to expensive and the patient is requesting something that wouldn't cost as much.  Please advise.

## 2012-06-30 ENCOUNTER — Encounter: Payer: Self-pay | Admitting: Internal Medicine

## 2012-06-30 ENCOUNTER — Ambulatory Visit (INDEPENDENT_AMBULATORY_CARE_PROVIDER_SITE_OTHER): Payer: Medicare Other | Admitting: Internal Medicine

## 2012-06-30 ENCOUNTER — Telehealth: Payer: Self-pay | Admitting: Internal Medicine

## 2012-06-30 VITALS — BP 140/80 | HR 72 | Temp 98.1°F | Ht 67.5 in | Wt 195.2 lb

## 2012-06-30 DIAGNOSIS — H6690 Otitis media, unspecified, unspecified ear: Secondary | ICD-10-CM

## 2012-06-30 DIAGNOSIS — H6691 Otitis media, unspecified, right ear: Secondary | ICD-10-CM

## 2012-06-30 DIAGNOSIS — J40 Bronchitis, not specified as acute or chronic: Secondary | ICD-10-CM

## 2012-06-30 MED ORDER — LEVOFLOXACIN 500 MG PO TABS: 500.0000 mg | ORAL_TABLET | Freq: Every day | ORAL | Status: DC

## 2012-06-30 NOTE — Patient Instructions (Signed)
Please call if symptoms not improving over the next week. If no improvement, I would favor setting up evaluation with ENT.

## 2012-06-30 NOTE — Progress Notes (Signed)
Subjective:    Patient ID: Monica Larson, female    DOB: 22-Jun-1946, 66 y.o.   MRN: [id number]  HPI 66 year old female presents for acute visit complaining of persistent nasal congestion, sinus pressure, postnasal drip, and right ear pain after recent treatment for right otitis media. She completed course of prednisone and Levaquin and reports that symptoms have improved but not completely resolved. She denies any shortness of breath, cough, fever, chills. She is traveling out of town tomorrow and was concerned about persistent symptoms.  Outpatient Encounter Prescriptions as of 06/30/2012  Medication Sig Dispense Refill  . aspirin (ASPIR-LOW) 81 MG EC tablet Take 81 mg by mouth daily.        . Calcium Carbonate-Vitamin D (CALCIUM + D PO) Take 1 tablet by mouth daily.        . citalopram (CELEXA) 20 MG tablet Take 1 tablet (20 mg total) by mouth daily.  90 tablet  3  . cyanocobalamin (,VITAMIN B-12,) 1000 MCG/ML injection Inject 1 mL (1,000 mcg total) into the muscle every 30 (thirty) days.  10 mL  6  . DiphenhydrAMINE HCl (BENADRYL ALLERGY PO) Take 1 tablet by mouth daily as needed.        Marland Kitchen esomeprazole (NEXIUM) 40 MG capsule Take 1 capsule (40 mg total) by mouth daily.  90 capsule  3  . etodolac (LODINE) 400 MG tablet Take 1 tablet (400 mg total) by mouth 2 (two) times daily.  180 tablet  1  . HYDROcodone-acetaminophen (VICODIN) 5-500 MG per tablet Take 1 tablet by mouth 2 (two) times daily as needed (for coughing).  30 tablet  0  . lisinopril (PRINIVIL,ZESTRIL) 20 MG tablet Take 1 tablet (20 mg total) by mouth daily.  90 tablet  2  . scopolamine (TRANSDERM-SCOP) 1.5 MG Place 1 patch (1.5 mg total) onto the skin every 3 (three) days.  10 patch  0  . solifenacin (VESICARE) 10 MG tablet Take 1 tablet (10 mg total) by mouth daily.  90 tablet  3  . traZODone (DESYREL) 50 MG tablet Take 1 tablet (50 mg total) by mouth at bedtime.  30 tablet  6  . levofloxacin (LEVAQUIN) 500 MG tablet Take 1 tablet  (500 mg total) by mouth daily.  7 tablet  0   BP 140/80  Pulse 72  Temp 98.1 F (36.7 C) (Oral)  Ht 5' 7.5" (1.715 m)  Wt 195 lb 4 oz (88.565 kg)  BMI 30.13 kg/m2  SpO2 98%  Review of Systems  Constitutional: Negative for fever, chills and unexpected weight change.  HENT: Positive for hearing loss, ear pain, congestion, rhinorrhea and postnasal drip. Negative for nosebleeds, sore throat, facial swelling, sneezing, mouth sores, trouble swallowing, neck pain, neck stiffness, voice change, sinus pressure, tinnitus and ear discharge.   Eyes: Negative for pain, discharge, redness and visual disturbance.  Respiratory: Negative for cough, chest tightness, shortness of breath, wheezing and stridor.   Cardiovascular: Negative for chest pain, palpitations and leg swelling.  Musculoskeletal: Negative for myalgias and arthralgias.  Skin: Negative for color change and rash.  Neurological: Negative for dizziness, weakness, light-headedness and headaches.  Hematological: Negative for adenopathy.       Objective:   Physical Exam  Constitutional: She is oriented to person, place, and time. She appears well-developed and well-nourished. No distress.  HENT:  Head: Normocephalic and atraumatic.  Right Ear: External ear normal. Tympanic membrane is bulging. Tympanic membrane is not erythematous. A middle ear effusion is present.  Left Ear: Tympanic membrane  and external ear normal.  Nose: Nose normal.  Mouth/Throat: Oropharynx is clear and moist. No oropharyngeal exudate.  Eyes: Conjunctivae normal are normal. Pupils are equal, round, and reactive to light. Right eye exhibits no discharge. Left eye exhibits no discharge. No scleral icterus.  Neck: Normal range of motion. Neck supple. No tracheal deviation present. No thyromegaly present.  Cardiovascular: Normal rate, regular rhythm, normal heart sounds and intact distal pulses.  Exam reveals no gallop and no friction rub.   No murmur  heard. Pulmonary/Chest: Effort normal and breath sounds normal. No accessory muscle usage. Not tachypneic. No respiratory distress. She has no decreased breath sounds. She has no wheezes. She has no rhonchi. She has no rales. She exhibits no tenderness.  Musculoskeletal: Normal range of motion. She exhibits no edema and no tenderness.  Lymphadenopathy:    She has no cervical adenopathy.  Neurological: She is alert and oriented to person, place, and time. No cranial nerve deficit. She exhibits normal muscle tone. Coordination normal.  Skin: Skin is warm and dry. No rash noted. She is not diaphoretic. No erythema. No pallor.  Psychiatric: She has a normal mood and affect. Her behavior is normal. Judgment and thought content normal.          Assessment & Plan:

## 2012-06-30 NOTE — Assessment & Plan Note (Signed)
Right otitis media appears to be improved on exam today. Erythema has resolved however there continues to be purulent middle ear effusion. Will continue course of Levaquin for another 7 days. Patient will call or e-mail with update. If symptoms are not improving over the next 7 days, would favor referral to ENT for further evaluation.

## 2012-06-30 NOTE — Telephone Encounter (Signed)
Caller: [name]/Patient; Patient Name: Monica Larson; PCP: Ronna Polio (Adults only); Best Callback Phone Number: [phone]. Patient was seen in office and diagnosed with sinus infection on 06/22/12. Has completed the antibiotics and Prednisone that were prescribed. Reports that she does have some improvement, but there is still some congestion and drainage at night. She will be going out of town on 07/01/12 in the evening. Also reports that the right ear is still painful. Afebrile. Reports that there are still tender/swollen lymph nodes behind her ear. Triaged per Upper Respiratory Infection guideline, disposition: see provider within 72 hours for "Localized tender, swollen glands that persist or are unimproved after 14 days." Appointment scheduled 06/30/12 at 11:30am with Dr. Dan Humphreys. Advised patient to call back before appointment if any symptoms worsen.

## 2012-07-20 LAB — HM MAMMOGRAPHY

## 2012-07-22 ENCOUNTER — Ambulatory Visit: Payer: Medicare Other | Admitting: Internal Medicine

## 2012-08-26 ENCOUNTER — Encounter: Payer: Self-pay | Admitting: Internal Medicine

## 2012-08-26 ENCOUNTER — Ambulatory Visit (INDEPENDENT_AMBULATORY_CARE_PROVIDER_SITE_OTHER): Payer: Medicare Other | Admitting: Internal Medicine

## 2012-08-26 VITALS — BP 122/80 | HR 77 | Temp 98.0°F | Resp 16 | Wt 193.0 lb

## 2012-08-26 DIAGNOSIS — R300 Dysuria: Secondary | ICD-10-CM

## 2012-08-26 DIAGNOSIS — N390 Urinary tract infection, site not specified: Secondary | ICD-10-CM | POA: Insufficient documentation

## 2012-08-26 LAB — POCT URINALYSIS DIPSTICK
Glucose, UA: NEGATIVE
Nitrite, UA: POSITIVE
Protein, UA: NEGATIVE
Spec Grav, UA: <=1.005
Urobilinogen, UA: 0.2

## 2012-08-26 MED ORDER — CIPROFLOXACIN HCL 500 MG PO TABS: 500.0000 mg | ORAL_TABLET | Freq: Two times a day (BID) | ORAL | Status: DC

## 2012-08-26 NOTE — Progress Notes (Signed)
Subjective:    Patient ID: Monica Larson, female    DOB: [birth year], 66 y.o.   MRN: [id number]  HPI 66 year old female with history of hypertension presents for acute visit complaining of two-day history of increased urinary frequency, burning, and urgency. She also reports some chills. She denies any fever or flank pain. She took over-the-counter azo with some improvement in her symptoms however then symptoms recurred. She has a history of urinary tract infections in the past and symptoms are similar to prior occasions.  Outpatient Encounter Prescriptions as of 08/26/2012  Medication Sig Dispense Refill  . aspirin (ASPIR-LOW) 81 MG EC tablet Take 81 mg by mouth daily.        . Calcium Carbonate-Vitamin D (CALCIUM + D PO) Take 1 tablet by mouth daily.        . citalopram (CELEXA) 20 MG tablet Take 1 tablet (20 mg total) by mouth daily.  90 tablet  3  . cyanocobalamin (,VITAMIN B-12,) 1000 MCG/ML injection Inject 1 mL (1,000 mcg total) into the muscle every 30 (thirty) days.  10 mL  6  . DiphenhydrAMINE HCl (BENADRYL ALLERGY PO) Take 1 tablet by mouth daily as needed.        Marland Kitchen esomeprazole (NEXIUM) 40 MG capsule Take 1 capsule (40 mg total) by mouth daily.  90 capsule  3  . etodolac (LODINE) 400 MG tablet Take 1 tablet (400 mg total) by mouth 2 (two) times daily.  180 tablet  1  . HYDROcodone-acetaminophen (VICODIN) 5-500 MG per tablet Take 1 tablet by mouth 2 (two) times daily as needed (for coughing).  30 tablet  0  . levofloxacin (LEVAQUIN) 500 MG tablet Take 1 tablet (500 mg total) by mouth daily.  7 tablet  0  . lisinopril (PRINIVIL,ZESTRIL) 20 MG tablet Take 1 tablet (20 mg total) by mouth daily.  90 tablet  2  . scopolamine (TRANSDERM-SCOP) 1.5 MG Place 1 patch (1.5 mg total) onto the skin every 3 (three) days.  10 patch  0  . solifenacin (VESICARE) 10 MG tablet Take 1 tablet (10 mg total) by mouth daily.  90 tablet  3  . traZODone (DESYREL) 50 MG tablet Take 1 tablet (50 mg total) by mouth at  bedtime.  30 tablet  6  . ciprofloxacin (CIPRO) 500 MG tablet Take 1 tablet (500 mg total) by mouth 2 (two) times daily.  14 tablet  0   BP 122/80  Pulse 77  Temp 98 F (36.7 C) (Oral)  Resp 16  Wt 193 lb (87.544 kg)  Review of Systems  Constitutional: Positive for chills. Negative for fever and fatigue.  Gastrointestinal: Negative for nausea, vomiting, abdominal pain, diarrhea, constipation and rectal pain.  Genitourinary: Positive for dysuria and urgency. Negative for hematuria, flank pain, decreased urine volume, vaginal bleeding, vaginal discharge, difficulty urinating, vaginal pain and pelvic pain.       Objective:   Physical Exam  Constitutional: She is oriented to person, place, and time. She appears well-developed and well-nourished. No distress.  HENT:  Head: Normocephalic and atraumatic.  Right Ear: External ear normal.  Left Ear: External ear normal.  Nose: Nose normal.  Mouth/Throat: Oropharynx is clear and moist. No oropharyngeal exudate.  Eyes: Conjunctivae normal are normal. Pupils are equal, round, and reactive to light. Right eye exhibits no discharge. Left eye exhibits no discharge. No scleral icterus.  Neck: Normal range of motion. Neck supple. No tracheal deviation present. No thyromegaly present.  Cardiovascular: Normal rate, regular rhythm, normal heart  sounds and intact distal pulses.  Exam reveals no gallop and no friction rub.   No murmur heard. Pulmonary/Chest: Effort normal and breath sounds normal. No respiratory distress. She has no wheezes. She has no rales. She exhibits no tenderness.  Abdominal: There is no tenderness (no CVA tenderness).  Musculoskeletal: Normal range of motion. She exhibits no edema and no tenderness.  Lymphadenopathy:    She has no cervical adenopathy.  Neurological: She is alert and oriented to person, place, and time. No cranial nerve deficit. She exhibits normal muscle tone. Coordination normal.  Skin: Skin is warm and dry. No  rash noted. She is not diaphoretic. No erythema. No pallor.  Psychiatric: She has a normal mood and affect. Her behavior is normal. Judgment and thought content normal.          Assessment & Plan:

## 2012-08-26 NOTE — Assessment & Plan Note (Signed)
Symptoms and urinalysis consistent with UTI. Will send urine for culture. Will treat with Cipro. Pt will call if symptoms are not improving.

## 2012-08-27 ENCOUNTER — Ambulatory Visit: Payer: Medicare Other | Admitting: Internal Medicine

## 2012-08-29 LAB — URINE CULTURE: Colony Count: 25000

## 2012-09-23 ENCOUNTER — Other Ambulatory Visit: Payer: Self-pay | Admitting: Internal Medicine

## 2012-09-23 MED ORDER — LISINOPRIL 20 MG PO TABS: 20.0000 mg | ORAL_TABLET | Freq: Every day | ORAL | Status: DC

## 2012-09-23 NOTE — Telephone Encounter (Signed)
lisinopril (PRINIVIL,ZESTRIL) 20 MG tablet  #90

## 2012-10-24 ENCOUNTER — Other Ambulatory Visit: Payer: Self-pay

## 2013-01-13 ENCOUNTER — Telehealth: Payer: Self-pay | Admitting: *Deleted

## 2013-01-13 DIAGNOSIS — F329 Major depressive disorder, single episode, unspecified: Secondary | ICD-10-CM

## 2013-01-13 NOTE — Telephone Encounter (Signed)
Patient left message stating she need refills but did not leave name of medications she need to have refilled. Spoke with patient she will call back tomorrow with medication names.

## 2013-01-14 MED ORDER — LISINOPRIL 20 MG PO TABS: 20.0000 mg | ORAL_TABLET | Freq: Every day | ORAL | Status: DC

## 2013-01-14 MED ORDER — CITALOPRAM HYDROBROMIDE 20 MG PO TABS: 20.0000 mg | ORAL_TABLET | Freq: Every day | ORAL | Status: DC

## 2013-01-14 MED ORDER — SOLIFENACIN SUCCINATE 10 MG PO TABS: 10.0000 mg | ORAL_TABLET | Freq: Every day | ORAL | Status: DC

## 2013-01-14 MED ORDER — ETODOLAC 400 MG PO TABS: 400.0000 mg | ORAL_TABLET | Freq: Two times a day (BID) | ORAL | Status: DC

## 2013-01-14 NOTE — Telephone Encounter (Signed)
Rx faxed to [facility]

## 2013-03-30 ENCOUNTER — Encounter: Payer: Self-pay | Admitting: Internal Medicine

## 2013-03-30 ENCOUNTER — Ambulatory Visit (INDEPENDENT_AMBULATORY_CARE_PROVIDER_SITE_OTHER): Payer: Medicare Other | Admitting: Internal Medicine

## 2013-03-30 VITALS — BP 160/86 | HR 69 | Temp 98.3°F | Wt 189.0 lb

## 2013-03-30 DIAGNOSIS — I10 Essential (primary) hypertension: Secondary | ICD-10-CM

## 2013-03-30 DIAGNOSIS — J209 Acute bronchitis, unspecified: Secondary | ICD-10-CM

## 2013-03-30 MED ORDER — HYDROCOD POLST-CHLORPHEN POLST 10-8 MG/5ML PO LQCR: 5.0000 mL | Freq: Two times a day (BID) | ORAL | Status: DC | PRN

## 2013-03-30 MED ORDER — AMOXICILLIN-POT CLAVULANATE 875-125 MG PO TABS: 1.0000 | ORAL_TABLET | Freq: Two times a day (BID) | ORAL | Status: DC

## 2013-03-30 NOTE — Progress Notes (Signed)
Subjective:    Patient ID: Monica Larson, female    DOB: 11-Dec-1945, 67 y.o.   MRN: [id number]  HPI 67 year old female with history of tobacco abuse presents for acute visit complaining of one-week history of cough productive of purulent sputum and shortness of breath. When she initially developed symptoms, she was away on vacation. She was seen at urgent care and treated with prednisone and Cipro. She reports minimal improvement with this. She denies any recent wheezing. She denies chest pain. She denies fever or chills. She has been using over-the-counter cough medication with minimal improvement.  Outpatient Encounter Prescriptions as of 03/30/2013  Medication Sig Dispense Refill  . aspirin (ASPIR-LOW) 81 MG EC tablet Take 81 mg by mouth daily.        . Calcium Carbonate-Vitamin D (CALCIUM + D PO) Take 1 tablet by mouth daily.        . ciprofloxacin (CIPRO) 500 MG tablet Take 500 mg by mouth 2 (two) times daily.      . citalopram (CELEXA) 20 MG tablet Take 1 tablet (20 mg total) by mouth daily.  90 tablet  0  . cyanocobalamin (,VITAMIN B-12,) 1000 MCG/ML injection Inject 1 mL (1,000 mcg total) into the muscle every 30 (thirty) days.  10 mL  6  . DiphenhydrAMINE HCl (BENADRYL ALLERGY PO) Take 1 tablet by mouth daily as needed.        Marland Kitchen esomeprazole (NEXIUM) 40 MG capsule Take 1 capsule (40 mg total) by mouth daily.  90 capsule  3  . etodolac (LODINE) 400 MG tablet Take 1 tablet (400 mg total) by mouth 2 (two) times daily.  180 tablet  0  . lisinopril (PRINIVIL,ZESTRIL) 20 MG tablet Take 1 tablet (20 mg total) by mouth daily.  90 tablet  0  . solifenacin (VESICARE) 10 MG tablet Take 1 tablet (10 mg total) by mouth daily.  90 tablet  0  . traZODone (DESYREL) 50 MG tablet Take 1 tablet (50 mg total) by mouth at bedtime.  30 tablet  6  . [DISCONTINUED] ciprofloxacin (CIPRO) 500 MG tablet Take 1 tablet (500 mg total) by mouth 2 (two) times daily.  14 tablet  0  . [DISCONTINUED] levofloxacin (LEVAQUIN)  500 MG tablet Take 1 tablet (500 mg total) by mouth daily.  7 tablet  0  . amoxicillin-clavulanate (AUGMENTIN) 875-125 MG per tablet Take 1 tablet by mouth 2 (two) times daily.  20 tablet  0  . chlorpheniramine-HYDROcodone (TUSSIONEX) 10-8 MG/5ML LQCR Take 5 mLs by mouth every 12 (twelve) hours as needed.  140 mL  0  . HYDROcodone-acetaminophen (VICODIN) 5-500 MG per tablet Take 1 tablet by mouth 2 (two) times daily as needed (for coughing).  30 tablet  0  . scopolamine (TRANSDERM-SCOP) 1.5 MG Place 1 patch (1.5 mg total) onto the skin every 3 (three) days.  10 patch  0   No facility-administered encounter medications on file as of 03/30/2013.   BP 160/86  Pulse 69  Temp(Src) 98.3 F (36.8 C) (Oral)  Wt 189 lb (85.73 kg)  BMI 29.15 kg/m2  SpO2 97%  Review of Systems  Constitutional: Negative for fever, chills, appetite change, fatigue and unexpected weight change.  HENT: Negative for ear pain, congestion, sore throat, trouble swallowing, neck pain, voice change and sinus pressure.   Eyes: Negative for visual disturbance.  Respiratory: Positive for cough, shortness of breath and wheezing. Negative for stridor.   Cardiovascular: Negative for chest pain, palpitations and leg swelling.  Gastrointestinal: Negative for  nausea, vomiting, abdominal pain, diarrhea, constipation, blood in stool, abdominal distention and anal bleeding.  Genitourinary: Negative for dysuria and flank pain.  Musculoskeletal: Negative for myalgias, arthralgias and gait problem.  Skin: Negative for color change and rash.  Neurological: Negative for dizziness and headaches.  Hematological: Negative for adenopathy. Does not bruise/bleed easily.  Psychiatric/Behavioral: Negative for suicidal ideas, sleep disturbance and dysphoric mood. The patient is not nervous/anxious.        Objective:   Physical Exam  Constitutional: She is oriented to person, place, and time. She appears well-developed and well-nourished. No  distress.  HENT:  Head: Normocephalic and atraumatic.  Right Ear: External ear normal.  Left Ear: External ear normal.  Nose: Nose normal.  Mouth/Throat: Oropharynx is clear and moist. No oropharyngeal exudate.  Eyes: Conjunctivae are normal. Pupils are equal, round, and reactive to light. Right eye exhibits no discharge. Left eye exhibits no discharge. No scleral icterus.  Neck: Normal range of motion. Neck supple. No tracheal deviation present. No thyromegaly present.  Cardiovascular: Normal rate, regular rhythm, normal heart sounds and intact distal pulses.  Exam reveals no gallop and no friction rub.   No murmur heard. Pulmonary/Chest: Effort normal. No accessory muscle usage. Not tachypneic. No respiratory distress. She has no decreased breath sounds. She has no wheezes. She has rhonchi in the right upper field and the right middle field. She has no rales. She exhibits no tenderness.  Musculoskeletal: Normal range of motion. She exhibits no edema and no tenderness.  Lymphadenopathy:    She has no cervical adenopathy.  Neurological: She is alert and oriented to person, place, and time. No cranial nerve deficit. She exhibits normal muscle tone. Coordination normal.  Skin: Skin is warm and dry. No rash noted. She is not diaphoretic. No erythema. No pallor.  Psychiatric: She has a normal mood and affect. Her behavior is normal. Judgment and thought content normal.          Assessment & Plan:

## 2013-03-30 NOTE — Assessment & Plan Note (Signed)
Symptoms and exam are consistent with bronchitis. Some improvement with recent course of prednisone and cipro. Will change antibiotics to Augmentin. Will start Tussionex for cough. Encourage fluids, rest. If symptoms not improving by Friday, then will plan to get CXR for further evaluation.

## 2013-03-30 NOTE — Patient Instructions (Addendum)
Please email or call with update on Friday.   Bronchitis Bronchitis is the body's way of reacting to injury and/or infection (inflammation) of the bronchi. Bronchi are the air tubes that extend from the windpipe into the lungs. If the inflammation becomes severe, it may cause shortness of breath. CAUSES  Inflammation may be caused by:  A virus.  Germs (bacteria).  Dust.  Allergens.  Pollutants and many other irritants. The cells lining the bronchial tree are covered with tiny hairs (cilia). These constantly beat upward, away from the lungs, toward the mouth. This keeps the lungs free of pollutants. When these cells become too irritated and are unable to do their job, mucus begins to develop. This causes the characteristic cough of bronchitis. The cough clears the lungs when the cilia are unable to do their job. Without either of these protective mechanisms, the mucus would settle in the lungs. Then you would develop pneumonia. Smoking is a common cause of bronchitis and can contribute to pneumonia. Stopping this habit is the single most important thing you can do to help yourself. TREATMENT   Your caregiver may prescribe an antibiotic if the cough is caused by bacteria. Also, medicines that open up your airways make it easier to breathe. Your caregiver may also recommend or prescribe an expectorant. It will loosen the mucus to be coughed up. Only take over-the-counter or prescription medicines for pain, discomfort, or fever as directed by your caregiver.  Removing whatever causes the problem (smoking, for example) is critical to preventing the problem from getting worse.  Cough suppressants may be prescribed for relief of cough symptoms.  Inhaled medicines may be prescribed to help with symptoms now and to help prevent problems from returning.  For those with recurrent (chronic) bronchitis, there may be a need for steroid medicines. SEEK IMMEDIATE MEDICAL CARE IF:   During treatment,  you develop more pus-like mucus (purulent sputum).  You have a fever.  Your baby is older than 3 months with a rectal temperature of 102 F (38.9 C) or higher.  Your baby is 2 months old or younger with a rectal temperature of 100.4 F (38 C) or higher.  You become progressively more ill.  You have increased difficulty breathing, wheezing, or shortness of breath. It is necessary to seek immediate medical care if you are elderly or sick from any other disease. MAKE SURE YOU:   Understand these instructions.  Will watch your condition.  Will get help right away if you are not doing well or get worse. Document Released: 08/26/2005 Document Revised: 11/18/2011 Document Reviewed: 07/05/2008 [facility] Patient Information 2014 [place], [place].

## 2013-03-30 NOTE — Assessment & Plan Note (Signed)
BP Readings from Last 3 Encounters:  03/30/13 160/86  08/26/12 122/80  06/30/12 140/80   BP elevated today, however pt has been ill. Will plan to monitor BP at home. If consistently >140/90, will have her call back and consider adding medication.

## 2013-04-02 ENCOUNTER — Ambulatory Visit (INDEPENDENT_AMBULATORY_CARE_PROVIDER_SITE_OTHER)
Admission: RE | Admit: 2013-04-02 | Discharge: 2013-04-02 | Disposition: A | Discharge status: Home / Self Care | Payer: Medicare Other | Source: Ambulatory Visit | Attending: Internal Medicine | Admitting: Internal Medicine

## 2013-04-02 ENCOUNTER — Telehealth: Payer: Self-pay | Admitting: Internal Medicine

## 2013-04-02 DIAGNOSIS — R05 Cough: Secondary | ICD-10-CM

## 2013-04-02 NOTE — Telephone Encounter (Signed)
Pt asking for chest x-ray, states she was told at appt last week she could just be sent for a chest x-ray if she was not feeling better.  States she still has hacking cough.  Pt does not want to make an appt here.  Pt asking to have this set up at hospital.

## 2013-04-02 NOTE — Telephone Encounter (Signed)
She was in the office last week with congestion and all stopped up. She has been on abx still no relief and she was told if not better by Friday then call so she could be sent for a chest xray. Informed patient I would pass this by Dr. Dan Humphreys and we usually send patients to [facility] office, she was fine with that.

## 2013-04-02 NOTE — Telephone Encounter (Signed)
Yes please send patient to [facility] for chest x-ray.

## 2013-04-02 NOTE — Telephone Encounter (Signed)
Patient informed and will go for CXR today

## 2013-04-05 ENCOUNTER — Telehealth: Payer: Self-pay | Admitting: Internal Medicine

## 2013-04-05 NOTE — Telephone Encounter (Signed)
Have you received her results yet?

## 2013-04-05 NOTE — Telephone Encounter (Signed)
I sent the results to her on Friday via MyChart. CXR was normal.

## 2013-04-05 NOTE — Telephone Encounter (Signed)
Patient calling for her chest X-ray results.

## 2013-04-05 NOTE — Telephone Encounter (Signed)
Patient informed and verbalized understanding

## 2013-04-08 ENCOUNTER — Ambulatory Visit (INDEPENDENT_AMBULATORY_CARE_PROVIDER_SITE_OTHER): Payer: Medicare Other | Admitting: Internal Medicine

## 2013-04-08 ENCOUNTER — Encounter: Payer: Self-pay | Admitting: Internal Medicine

## 2013-04-08 ENCOUNTER — Telehealth: Payer: Self-pay | Admitting: Internal Medicine

## 2013-04-08 VITALS — BP 150/80 | HR 89 | Temp 98.3°F | Wt 187.0 lb

## 2013-04-08 DIAGNOSIS — J209 Acute bronchitis, unspecified: Secondary | ICD-10-CM

## 2013-04-08 MED ORDER — PREDNISONE (PAK) 10 MG PO TABS: ORAL_TABLET | ORAL | Status: DC

## 2013-04-08 MED ORDER — DOXYCYCLINE HYCLATE 100 MG PO TABS: 100.0000 mg | ORAL_TABLET | Freq: Two times a day (BID) | ORAL | Status: DC

## 2013-04-08 MED ORDER — ALBUTEROL SULFATE HFA 108 (90 BASE) MCG/ACT IN AERS: 2.0000 | INHALATION_SPRAY | Freq: Four times a day (QID) | RESPIRATORY_TRACT | Status: DC | PRN

## 2013-04-08 NOTE — Telephone Encounter (Signed)
Fwd to Dr. Walker 

## 2013-04-08 NOTE — Assessment & Plan Note (Signed)
Symptoms consistent with acute bronchitis. Will change antibiotics to Doxycycline and will add prednisone taper. Will start inhaled albuterol to see if this helps with cough. Continue Tussionex for cough. Reviewed recent CXR which was normal. Follow up 1 week and prn.

## 2013-04-08 NOTE — Telephone Encounter (Signed)
She needs to be seen if symptoms are persistent.

## 2013-04-08 NOTE — Progress Notes (Signed)
Subjective:    Patient ID: Monica Larson, female    DOB: 24-Nov-1945, 67 y.o.   MRN: [id number]  HPI 67 year old female with history of tobacco abuse and recent episode of acute bronchitis presents for followup. She was seen on July 28 after developing a one-week history of cough productive of purulent sputum and shortness of breath. She started treatment with Augmentin. She reports some improvement in symptoms of productive cough and shortness of breath, however she continues to have mostly dry cough which is keeping her up at night and making it difficult for her to function. No fever, chills, chest pain. She is not currently smoking.  Outpatient Encounter Prescriptions as of 04/08/2013  Medication Sig Dispense Refill  . aspirin (ASPIR-LOW) 81 MG EC tablet Take 81 mg by mouth daily.        . Calcium Carbonate-Vitamin D (CALCIUM + D PO) Take 1 tablet by mouth daily.        . chlorpheniramine-HYDROcodone (TUSSIONEX) 10-8 MG/5ML LQCR Take 5 mLs by mouth every 12 (twelve) hours as needed.  140 mL  0  . citalopram (CELEXA) 20 MG tablet Take 1 tablet (20 mg total) by mouth daily.  90 tablet  0  . cyanocobalamin (,VITAMIN B-12,) 1000 MCG/ML injection Inject 1 mL (1,000 mcg total) into the muscle every 30 (thirty) days.  10 mL  6  . DiphenhydrAMINE HCl (BENADRYL ALLERGY PO) Take 1 tablet by mouth daily as needed.        Marland Kitchen esomeprazole (NEXIUM) 40 MG capsule Take 1 capsule (40 mg total) by mouth daily.  90 capsule  3  . etodolac (LODINE) 400 MG tablet Take 1 tablet (400 mg total) by mouth 2 (two) times daily.  180 tablet  0  . HYDROcodone-acetaminophen (VICODIN) 5-500 MG per tablet Take 1 tablet by mouth 2 (two) times daily as needed (for coughing).  30 tablet  0  . lisinopril (PRINIVIL,ZESTRIL) 20 MG tablet Take 1 tablet (20 mg total) by mouth daily.  90 tablet  0  . solifenacin (VESICARE) 10 MG tablet Take 1 tablet (10 mg total) by mouth daily.  90 tablet  0  . traZODone (DESYREL) 50 MG tablet Take 1 tablet  (50 mg total) by mouth at bedtime.  30 tablet  6  . [DISCONTINUED] amoxicillin-clavulanate (AUGMENTIN) 875-125 MG per tablet Take 1 tablet by mouth 2 (two) times daily.  20 tablet  0  . albuterol (PROVENTIL HFA;VENTOLIN HFA) 108 (90 BASE) MCG/ACT inhaler Inhale 2 puffs into the lungs every 6 (six) hours as needed for wheezing.  1 Inhaler  0  . doxycycline (VIBRA-TABS) 100 MG tablet Take 1 tablet (100 mg total) by mouth 2 (two) times daily.  20 tablet  0  . predniSONE (STERAPRED UNI-PAK) 10 MG tablet Take 60mg  day 1 then taper by 10mg  daily  21 tablet  0  . scopolamine (TRANSDERM-SCOP) 1.5 MG Place 1 patch (1.5 mg total) onto the skin every 3 (three) days.  10 patch  0   No facility-administered encounter medications on file as of 04/08/2013.   BP 150/80  Pulse 89  Temp(Src) 98.3 F (36.8 C) (Oral)  Wt 187 lb (84.823 kg)  BMI 28.84 kg/m2  SpO2 94%  Review of Systems  Constitutional: Positive for diaphoresis. Negative for fever, chills and unexpected weight change.  HENT: Negative for hearing loss, ear pain, nosebleeds, congestion, sore throat, facial swelling, rhinorrhea, sneezing, mouth sores, trouble swallowing, neck pain, neck stiffness, voice change, postnasal drip, sinus pressure, tinnitus and  ear discharge.   Eyes: Negative for pain, discharge, redness and visual disturbance.  Respiratory: Positive for cough. Negative for chest tightness, shortness of breath, wheezing and stridor.   Cardiovascular: Negative for chest pain, palpitations and leg swelling.  Musculoskeletal: Negative for myalgias and arthralgias.  Skin: Negative for color change and rash.  Neurological: Negative for dizziness, weakness, light-headedness and headaches.  Hematological: Negative for adenopathy.       Objective:   Physical Exam  Constitutional: She is oriented to person, place, and time. She appears well-developed and well-nourished. No distress.  HENT:  Head: Normocephalic and atraumatic.  Right Ear:  External ear normal.  Left Ear: External ear normal.  Nose: Nose normal.  Mouth/Throat: Oropharynx is clear and moist. No oropharyngeal exudate.  Eyes: Conjunctivae are normal. Pupils are equal, round, and reactive to light. Right eye exhibits no discharge. Left eye exhibits no discharge. No scleral icterus.  Neck: Normal range of motion. Neck supple. No tracheal deviation present. No thyromegaly present.  Cardiovascular: Normal rate, regular rhythm, normal heart sounds and intact distal pulses.  Exam reveals no gallop and no friction rub.   No murmur heard. Pulmonary/Chest: Effort normal. No accessory muscle usage. Not tachypneic. No respiratory distress. She has no decreased breath sounds. She has no wheezes. She has rhonchi (scattered). She has no rales. She exhibits no tenderness.  Musculoskeletal: Normal range of motion. She exhibits no edema and no tenderness.  Lymphadenopathy:    She has no cervical adenopathy.  Neurological: She is alert and oriented to person, place, and time. No cranial nerve deficit. She exhibits normal muscle tone. Coordination normal.  Skin: Skin is warm and dry. No rash noted. She is not diaphoretic. No erythema. No pallor.  Psychiatric: She has a normal mood and affect. Her behavior is normal. Judgment and thought content normal.          Assessment & Plan:

## 2013-04-08 NOTE — Telephone Encounter (Signed)
Patient informed and stated she could be here, appointment given for 1115 today

## 2013-04-08 NOTE — Telephone Encounter (Signed)
Pt was in last week and she was given antibiotic and cough syrup. She is not any better she is down to 2 pills. She was wondering if she should get something else called in or what she should do ???

## 2013-04-14 ENCOUNTER — Other Ambulatory Visit: Payer: Self-pay

## 2013-04-20 ENCOUNTER — Telehealth: Payer: Self-pay | Admitting: Internal Medicine

## 2013-04-20 NOTE — Telephone Encounter (Signed)
Pt states she has an appointment with Dr. Chestine Spore, ENT, and would like her records sent over.  Advised pt of medical release authorization.  Pt stated her daughter works at Dr[employer] office and is going to call asking [place] to send the records.  Advised pt her daughter is not listed in her chart for [place] to be able to discuss PHI, and that we would still need the medical release form signed; also advised of DPR form that she can fill out to give [place] permission.

## 2013-04-21 MED ORDER — LISINOPRIL 20 MG PO TABS: 20.0000 mg | ORAL_TABLET | Freq: Every day | ORAL | Status: DC

## 2013-04-21 MED ORDER — SOLIFENACIN SUCCINATE 10 MG PO TABS: 10.0000 mg | ORAL_TABLET | Freq: Every day | ORAL | Status: DC

## 2013-04-21 NOTE — Telephone Encounter (Signed)
Noted and prescription sent to pharmacy

## 2013-05-04 ENCOUNTER — Telehealth: Payer: Self-pay | Admitting: Internal Medicine

## 2013-05-04 MED ORDER — SOLIFENACIN SUCCINATE 10 MG PO TABS: 10.0000 mg | ORAL_TABLET | Freq: Every day | ORAL | Status: DC

## 2013-05-04 NOTE — Telephone Encounter (Signed)
Rx sent to the pharmacy, patient stated the pharmacy did not have this prescription.

## 2013-05-04 NOTE — Telephone Encounter (Signed)
° °  solifenacin (VESICARE) 10 MG tablet

## 2013-06-08 ENCOUNTER — Telehealth: Payer: Self-pay | Admitting: Internal Medicine

## 2013-06-08 DIAGNOSIS — Z1239 Encounter for other screening for malignant neoplasm of breast: Secondary | ICD-10-CM

## 2013-06-08 NOTE — Telephone Encounter (Signed)
States she needs annual diagnostic mammogram scheduled.  Also received letter for immunizations.  Pt states she has received some vaccines elsewhere.  Pneumonia shot has been received about 3-4 years ago.  Has had shingles vaccine.  Had colonoscopy about 5 years ago.  Influenza has been received at [facility].  F/u appt scheduled for 11/11.

## 2013-06-17 NOTE — Telephone Encounter (Signed)
Left message to call back  

## 2013-06-18 NOTE — Telephone Encounter (Signed)
Patient returned call, left a message on voicemail. I returned patient call, went to voicemail again. Left message to call back

## 2013-06-21 NOTE — Telephone Encounter (Signed)
Need diagnostic mammogram scheduled, could you please put in the order?

## 2013-07-15 ENCOUNTER — Other Ambulatory Visit: Payer: Self-pay

## 2013-07-20 ENCOUNTER — Encounter: Payer: Self-pay | Admitting: Internal Medicine

## 2013-07-20 ENCOUNTER — Ambulatory Visit (INDEPENDENT_AMBULATORY_CARE_PROVIDER_SITE_OTHER): Payer: Medicare Other | Admitting: Internal Medicine

## 2013-07-20 VITALS — BP 144/70 | HR 68 | Temp 98.0°F | Wt 189.0 lb

## 2013-07-20 DIAGNOSIS — Z1239 Encounter for other screening for malignant neoplasm of breast: Secondary | ICD-10-CM

## 2013-07-20 DIAGNOSIS — M2550 Pain in unspecified joint: Secondary | ICD-10-CM

## 2013-07-20 DIAGNOSIS — K219 Gastro-esophageal reflux disease without esophagitis: Secondary | ICD-10-CM

## 2013-07-20 DIAGNOSIS — G4700 Insomnia, unspecified: Secondary | ICD-10-CM

## 2013-07-20 DIAGNOSIS — Z6825 Body mass index (BMI) 25.0-25.9, adult: Secondary | ICD-10-CM

## 2013-07-20 DIAGNOSIS — E663 Overweight: Secondary | ICD-10-CM

## 2013-07-20 DIAGNOSIS — E669 Obesity, unspecified: Secondary | ICD-10-CM | POA: Insufficient documentation

## 2013-07-20 DIAGNOSIS — I10 Essential (primary) hypertension: Secondary | ICD-10-CM

## 2013-07-20 LAB — MICROALBUMIN / CREATININE URINE RATIO
Microalb Creat Ratio: 0.3 mg/g (ref 0.0–30.0)
Microalb, Ur: 0.8 mg/dL (ref 0.0–1.9)

## 2013-07-20 LAB — LIPID PANEL: Total CHOL/HDL Ratio: 4

## 2013-07-20 LAB — COMPREHENSIVE METABOLIC PANEL
ALT: 21 U/L (ref 0–35)
AST: 19 U/L (ref 0–37)
Albumin: 3.9 g/dL (ref 3.5–5.2)
Alkaline Phosphatase: 61 U/L (ref 39–117)
Potassium: 4.4 mEq/L (ref 3.5–5.1)
Sodium: 140 mEq/L (ref 135–145)
Total Protein: 7 g/dL (ref 6.0–8.3)

## 2013-07-20 MED ORDER — ETODOLAC 400 MG PO TABS: 400.0000 mg | ORAL_TABLET | Freq: Two times a day (BID) | ORAL | Status: DC

## 2013-07-20 MED ORDER — LISINOPRIL 20 MG PO TABS: 20.0000 mg | ORAL_TABLET | Freq: Every day | ORAL | Status: DC

## 2013-07-20 MED ORDER — TRAZODONE HCL 100 MG PO TABS: 50.0000 mg | ORAL_TABLET | Freq: Every day | ORAL | Status: DC

## 2013-07-20 NOTE — Patient Instructions (Addendum)

## 2013-07-20 NOTE — Assessment & Plan Note (Signed)
BP Readings from Last 3 Encounters:  07/20/13 144/70  04/08/13 150/80  03/30/13 160/86   BP generally well controlled on lisinopril, better controlled at home than in office. Will continue lisinopril. Will check renal function with labs today.

## 2013-07-20 NOTE — Assessment & Plan Note (Signed)
Encouraged healthy diet and regular exercise. Recommended referral to nutritionist for detailed meal planning. Pt declines for now.

## 2013-07-20 NOTE — Progress Notes (Signed)
Subjective:    Patient ID: Monica Larson, female    DOB: April 07, 1946, 67 y.o.   MRN: [id number]  HPI 67YO female with h/o hypertension presents for follow up. Generally feeling well. Compliant with medications. Reports BP well controlled. Only concern today is persistent insomnia despite using Trazodone 50mg  nightly. Notes that she wakes up several times per night even when taking this medication. She is trying to stay active during the day, exercising. She is trying to follow a healthier diet.  Outpatient Encounter Prescriptions as of 07/20/2013  Medication Sig  . albuterol (PROVENTIL HFA;VENTOLIN HFA) 108 (90 BASE) MCG/ACT inhaler Inhale 2 puffs into the lungs every 6 (six) hours as needed for wheezing.  Marland Kitchen aspirin (ASPIR-LOW) 81 MG EC tablet Take 81 mg by mouth daily.    . cyanocobalamin (,VITAMIN B-12,) 1000 MCG/ML injection Inject 1 mL (1,000 mcg total) into the muscle every 30 (thirty) days.  . DiphenhydrAMINE HCl (BENADRYL ALLERGY PO) Take 1 tablet by mouth daily as needed.    . etodolac (LODINE) 400 MG tablet Take 1 tablet (400 mg total) by mouth 2 (two) times daily.  Marland Kitchen HYDROcodone-acetaminophen (VICODIN) 5-500 MG per tablet Take 1 tablet by mouth 2 (two) times daily as needed (for coughing).  Marland Kitchen lisinopril (PRINIVIL,ZESTRIL) 20 MG tablet Take 1 tablet (20 mg total) by mouth daily.  . solifenacin (VESICARE) 10 MG tablet Take 1 tablet (10 mg total) by mouth daily.  . traZODone (DESYREL) 100 MG tablet Take 0.5 tablets (50 mg total) by mouth at bedtime.   BP 144/70  Pulse 68  Temp(Src) 98 F (36.7 C) (Oral)  Wt 189 lb (85.73 kg)  SpO2 97%  Review of Systems  Constitutional: Negative for fever, chills, appetite change, fatigue and unexpected weight change.  HENT: Negative for congestion, ear pain, sinus pressure, sore throat, trouble swallowing and voice change.   Eyes: Negative for visual disturbance.  Respiratory: Negative for cough, shortness of breath, wheezing and stridor.    Cardiovascular: Negative for chest pain, palpitations and leg swelling.  Gastrointestinal: Negative for nausea, vomiting, abdominal pain, diarrhea, constipation, blood in stool, abdominal distention and anal bleeding.  Genitourinary: Negative for dysuria and flank pain.  Musculoskeletal: Negative for arthralgias, gait problem, myalgias and neck pain.  Skin: Negative for color change and rash.  Neurological: Negative for dizziness and headaches.  Hematological: Negative for adenopathy. Does not bruise/bleed easily.  Psychiatric/Behavioral: Positive for sleep disturbance. Negative for suicidal ideas and dysphoric mood. The patient is not nervous/anxious.        Objective:   Physical Exam  Constitutional: She is oriented to person, place, and time. She appears well-developed and well-nourished. No distress.  HENT:  Head: Normocephalic and atraumatic.  Right Ear: External ear normal.  Left Ear: External ear normal.  Nose: Nose normal.  Mouth/Throat: Oropharynx is clear and moist. No oropharyngeal exudate.  Eyes: Conjunctivae are normal. Pupils are equal, round, and reactive to light. Right eye exhibits no discharge. Left eye exhibits no discharge. No scleral icterus.  Neck: Normal range of motion. Neck supple. No tracheal deviation present. No thyromegaly present.  Cardiovascular: Normal rate, regular rhythm, normal heart sounds and intact distal pulses.  Exam reveals no gallop and no friction rub.   No murmur heard. Pulmonary/Chest: Effort normal and breath sounds normal. No accessory muscle usage. Not tachypneic. No respiratory distress. She has no decreased breath sounds. She has no wheezes. She has no rhonchi. She has no rales. She exhibits no tenderness.  Musculoskeletal: Normal range of  motion. She exhibits no edema and no tenderness.  Lymphadenopathy:    She has no cervical adenopathy.  Neurological: She is alert and oriented to person, place, and time. No cranial nerve deficit. She  exhibits normal muscle tone. Coordination normal.  Skin: Skin is warm and dry. No rash noted. She is not diaphoretic. No erythema. No pallor.  Psychiatric: She has a normal mood and affect. Her behavior is normal. Judgment and thought content normal.          Assessment & Plan:

## 2013-07-20 NOTE — Assessment & Plan Note (Signed)
Persistent symptoms of insomnia despite using Trazodone 50mg  nightly. Will increase to 100mg  po qhs.

## 2013-07-20 NOTE — Progress Notes (Signed)
Pre-visit discussion using our clinic review tool. No additional management support is needed unless otherwise documented below in the visit note.  

## 2013-07-28 ENCOUNTER — Telehealth: Payer: Self-pay | Admitting: Internal Medicine

## 2013-07-28 NOTE — Telephone Encounter (Signed)
Pt was seen 11/11.  Says she has UTI and would like to come in and leave a specimen today.

## 2013-07-28 NOTE — Telephone Encounter (Signed)
appt scheduled w/ R. Rey 11/20 @ 1:30 p.m. For urinary symptoms.

## 2013-07-29 ENCOUNTER — Encounter: Payer: Self-pay | Admitting: Adult Health

## 2013-07-29 ENCOUNTER — Ambulatory Visit (INDEPENDENT_AMBULATORY_CARE_PROVIDER_SITE_OTHER): Payer: Medicare Other | Admitting: Adult Health

## 2013-07-29 VITALS — BP 122/66 | HR 66 | Temp 97.7°F | Resp 12 | Wt 187.0 lb

## 2013-07-29 DIAGNOSIS — R3915 Urgency of urination: Secondary | ICD-10-CM

## 2013-07-29 LAB — POCT URINALYSIS DIPSTICK
Blood, UA: NEGATIVE
Glucose, UA: 100
Ketones, UA: 15
pH, UA: 5

## 2013-07-29 MED ORDER — CIPROFLOXACIN HCL 500 MG PO TABS: 500.0000 mg | ORAL_TABLET | Freq: Two times a day (BID) | ORAL | Status: DC

## 2013-07-29 NOTE — Patient Instructions (Signed)
Urinary Tract Infection  Urinary tract infections (UTIs) can develop anywhere along your urinary tract. Your urinary tract is your body's drainage system for removing wastes and extra water. Your urinary tract includes two kidneys, two ureters, a bladder, and a urethra. Your kidneys are a pair of bean-shaped organs. Each kidney is about the size of your fist. They are located below your ribs, one on each side of your spine.  CAUSES  Infections are caused by microbes, which are microscopic organisms, including fungi, viruses, and bacteria. These organisms are so small that they can only be seen through a microscope. Bacteria are the microbes that most commonly cause UTIs.  SYMPTOMS   Symptoms of UTIs may vary by age and gender of the patient and by the location of the infection. Symptoms in young women typically include a frequent and intense urge to urinate and a painful, burning feeling in the bladder or urethra during urination. Older women and men are more likely to be tired, shaky, and weak and have muscle aches and abdominal pain. A fever may mean the infection is in your kidneys. Other symptoms of a kidney infection include pain in your back or sides below the ribs, nausea, and vomiting.  DIAGNOSIS  To diagnose a UTI, your caregiver will ask you about your symptoms. Your caregiver also will ask to provide a urine sample. The urine sample will be tested for bacteria and white blood cells. White blood cells are made by your body to help fight infection.  TREATMENT   Typically, UTIs can be treated with medication. Because most UTIs are caused by a bacterial infection, they usually can be treated with the use of antibiotics. The choice of antibiotic and length of treatment depend on your symptoms and the type of bacteria causing your infection.  HOME CARE INSTRUCTIONS   If you were prescribed antibiotics, take them exactly as your caregiver instructs you. Finish the medication even if you feel better after you  have only taken some of the medication.   Drink enough water and fluids to keep your urine clear or pale yellow.   Avoid caffeine, tea, and carbonated beverages. They tend to irritate your bladder.   Empty your bladder often. Avoid holding urine for long periods of time.   Empty your bladder before and after sexual intercourse.   After a bowel movement, women should cleanse from front to back. Use each tissue only once.  SEEK MEDICAL CARE IF:    You have back pain.   You develop a fever.   Your symptoms do not begin to resolve within 3 days.  SEEK IMMEDIATE MEDICAL CARE IF:    You have severe back pain or lower abdominal pain.   You develop chills.   You have nausea or vomiting.   You have continued burning or discomfort with urination.  MAKE SURE YOU:    Understand these instructions.   Will watch your condition.   Will get help right away if you are not doing well or get worse.  Document Released: 06/05/2005 Document Revised: 02/25/2012 Document Reviewed: 10/04/2011  Exit[employer] Patient Information 2014 [employer].

## 2013-07-29 NOTE — Telephone Encounter (Signed)
Noted  

## 2013-07-29 NOTE — Assessment & Plan Note (Signed)
UA dipstick shows pos nitrite. Send for culture. Start Cipro 500 mg bid x 7 days

## 2013-07-29 NOTE — Progress Notes (Signed)
  Subjective:    Patient ID: Monica Larson, female    DOB: 1946-01-15, 67 y.o.   MRN: [id number]  HPI  Patient presents with urinary urgency and pressure which started 2 days ago. She has taken pyridium with some relief. No hematuria or fever.   Review of Systems  Constitutional: Negative for fever and chills.  Genitourinary: Positive for urgency and frequency. Negative for dysuria, hematuria, flank pain and pelvic pain.       Objective:   Physical Exam  Constitutional: She is oriented to person, place, and time. No distress.  Cardiovascular: Normal rate and regular rhythm.   Pulmonary/Chest: Effort normal. No respiratory distress.  Neurological: She is alert and oriented to person, place, and time.  Skin: Skin is warm and dry.  Psychiatric: She has a normal mood and affect. Her behavior is normal. Judgment and thought content normal.          Assessment & Plan:

## 2013-07-29 NOTE — Progress Notes (Signed)
Pre visit review using our clinic review tool, if applicable. No additional management support is needed unless otherwise documented below in the visit note. 

## 2013-07-30 ENCOUNTER — Encounter: Payer: Self-pay | Admitting: Adult Health

## 2013-07-30 LAB — URINE CULTURE: Organism ID, Bacteria: NO GROWTH

## 2013-08-03 NOTE — Telephone Encounter (Signed)
Mailed unread message to pt  

## 2013-08-04 ENCOUNTER — Ambulatory Visit: Payer: Self-pay | Admitting: Internal Medicine

## 2013-08-10 ENCOUNTER — Telehealth: Payer: Self-pay | Admitting: Internal Medicine

## 2013-08-10 MED ORDER — SOLIFENACIN SUCCINATE 5 MG PO TABS: 5.0000 mg | ORAL_TABLET | Freq: Every day | ORAL | Status: DC

## 2013-08-10 NOTE — Telephone Encounter (Signed)
Vesicare rx needed.  States pharmacy told her to contact us.  [facility].

## 2013-08-10 NOTE — Telephone Encounter (Signed)
Rx sent 

## 2013-08-20 ENCOUNTER — Encounter: Payer: Self-pay | Admitting: Internal Medicine

## 2013-10-11 ENCOUNTER — Telehealth: Payer: Self-pay | Admitting: Emergency Medicine

## 2013-10-11 NOTE — Telephone Encounter (Signed)
Referral to [facility] underway for Dr. Renaldo Fiddler office.

## 2013-10-18 NOTE — Telephone Encounter (Signed)
Per fax rec'd back from [place], pt is not in the [facility] network

## 2013-10-25 ENCOUNTER — Telehealth: Payer: Self-pay | Admitting: Internal Medicine

## 2013-10-25 ENCOUNTER — Ambulatory Visit (INDEPENDENT_AMBULATORY_CARE_PROVIDER_SITE_OTHER): Payer: Medicare HMO | Admitting: Internal Medicine

## 2013-10-25 ENCOUNTER — Encounter: Payer: Self-pay | Admitting: Internal Medicine

## 2013-10-25 VITALS — BP 150/80 | HR 87 | Temp 97.9°F | Wt 187.0 lb

## 2013-10-25 DIAGNOSIS — I10 Essential (primary) hypertension: Secondary | ICD-10-CM

## 2013-10-25 DIAGNOSIS — H539 Unspecified visual disturbance: Secondary | ICD-10-CM

## 2013-10-25 DIAGNOSIS — N3941 Urge incontinence: Secondary | ICD-10-CM | POA: Insufficient documentation

## 2013-10-25 DIAGNOSIS — R32 Unspecified urinary incontinence: Secondary | ICD-10-CM

## 2013-10-25 LAB — POCT URINALYSIS DIPSTICK
GLUCOSE UA: NEGATIVE
Ketones, UA: NEGATIVE
Nitrite, UA: NEGATIVE
PH UA: 5.5
Protein, UA: NEGATIVE
RBC UA: NEGATIVE
UROBILINOGEN UA: 0.2

## 2013-10-25 MED ORDER — LISINOPRIL 20 MG PO TABS: 20.0000 mg | ORAL_TABLET | Freq: Every day | ORAL | Status: DC

## 2013-10-25 MED ORDER — CYANOCOBALAMIN 1000 MCG/ML IJ SOLN: 1000.0000 ug | INTRAMUSCULAR | Status: DC

## 2013-10-25 NOTE — Assessment & Plan Note (Signed)
Chronic urinary urgency with incontinence. No improvement with Oxybutinin, Toviaz, Vesicare. Urinalysis normal today. Will set up evaluation with urogynecology.

## 2013-10-25 NOTE — Assessment & Plan Note (Signed)
Referral placed for ophthalmologist for vision check.

## 2013-10-25 NOTE — Progress Notes (Signed)
Pre-visit discussion using our clinic review tool. No additional management support is needed unless otherwise documented below in the visit note.  

## 2013-10-25 NOTE — Progress Notes (Signed)
Subjective:    Patient ID: Monica Larson, female    DOB: 10-01-1945, 68 y.o.   MRN: [id number]  HPI 68YO female presents for follow up. Concerned about persistent urinary incontinence. Taking Vesicare $RemoveBeforeD'10mg'[id number]$  daily. Continues to have urinary urgency and cannot make it to the bathroom in time to urinate. Denies dysuria, frequency, flank pain, fever, chills. Has previously tried oxybutinin and Toviaz with no improvement.  Notes recent decrease in far vision after cataract surgery. Per her insurance policy, needs referral back to her ophthalmologist.   Review of Systems  Constitutional: Negative for fever, chills, appetite change, fatigue and unexpected weight change.  HENT: Negative for congestion, ear pain, sinus pressure, sore throat, trouble swallowing and voice change.   Eyes: Visual disturbance: decreased far vision.  Respiratory: Negative for cough, shortness of breath, wheezing and stridor.   Cardiovascular: Negative for chest pain, palpitations and leg swelling.  Gastrointestinal: Negative for nausea, vomiting, abdominal pain, diarrhea, constipation, blood in stool, abdominal distention and anal bleeding.  Genitourinary: Positive for urgency. Negative for dysuria, frequency, hematuria, flank pain, decreased urine volume and difficulty urinating.  Musculoskeletal: Negative for arthralgias, gait problem, myalgias and neck pain.  Skin: Negative for color change and rash.  Neurological: Negative for dizziness and headaches.  Hematological: Negative for adenopathy. Does not bruise/bleed easily.  Psychiatric/Behavioral: Negative for suicidal ideas, sleep disturbance and dysphoric mood. The patient is not nervous/anxious.        Objective:    BP 150/80  Pulse 87  Temp(Src) 97.9 F (36.6 C) (Oral)  Wt 187 lb (84.823 kg)  SpO2 97% Physical Exam  Constitutional: She is oriented to person, place, and time. She appears well-developed and well-nourished. No distress.  HENT:  Head:  Normocephalic and atraumatic.  Right Ear: External ear normal.  Left Ear: External ear normal.  Nose: Nose normal.  Mouth/Throat: Oropharynx is clear and moist. No oropharyngeal exudate.  Eyes: Conjunctivae are normal. Pupils are equal, round, and reactive to light. Right eye exhibits no discharge. Left eye exhibits no discharge. No scleral icterus.  Neck: Normal range of motion. Neck supple. No tracheal deviation present. No thyromegaly present.  Cardiovascular: Normal rate, regular rhythm, normal heart sounds and intact distal pulses.  Exam reveals no gallop and no friction rub.   No murmur heard. Pulmonary/Chest: Effort normal and breath sounds normal. No accessory muscle usage. Not tachypneic. No respiratory distress. She has no decreased breath sounds. She has no wheezes. She has no rhonchi. She has no rales. She exhibits no tenderness.  Musculoskeletal: Normal range of motion. She exhibits no edema and no tenderness.  Lymphadenopathy:    She has no cervical adenopathy.  Neurological: She is alert and oriented to person, place, and time. No cranial nerve deficit. She exhibits normal muscle tone. Coordination normal.  Skin: Skin is warm and dry. No rash noted. She is not diaphoretic. No erythema. No pallor.  Psychiatric: She has a normal mood and affect. Her behavior is normal. Judgment and thought content normal.          Assessment & Plan:   Problem List Items Addressed This Visit   Hypertension      BP Readings from Last 3 Encounters:  10/25/13 150/80  07/29/13 122/66  07/20/13 144/70   BP elevated today, however generally has been well controlled. Will continue Lisionpril. Will check renal function with labs today. Monitor BP at home, and call if BP  >140/90. Consider increase lisinopril dose if persistent BP elevation.    Relevant  Medications      lisinopril (PRINIVIL,ZESTRIL) tablet   Other Relevant Orders      Comp Met (CMET)   Urinary incontinence - Primary      Chronic urinary urgency with incontinence. No improvement with Oxybutinin, Toviaz, Vesicare. Urinalysis normal today. Will set up evaluation with urogynecology.    Relevant Orders      Ambulatory referral to Urogynecology      POCT Urinalysis Dipstick (Completed)      Urine culture   Visual disturbance     Referral placed for ophthalmologist for vision check.    Relevant Orders      Ambulatory referral to Ophthalmology       Return in about 3 months (around 01/22/2014) for Follow up urinary issues, Wellness Visit.

## 2013-10-25 NOTE — Assessment & Plan Note (Signed)
BP Readings from Last 3 Encounters:  10/25/13 150/80  07/29/13 122/66  07/20/13 144/70   BP elevated today, however generally has been well controlled. Will continue Lisionpril. Will check renal function with labs today. Monitor BP at home, and call if BP  >140/90. Consider increase lisinopril dose if persistent BP elevation.

## 2013-10-27 ENCOUNTER — Telehealth: Payer: Self-pay | Admitting: Internal Medicine

## 2013-10-27 LAB — URINE CULTURE
Colony Count: NO GROWTH
ORGANISM ID, BACTERIA: NO GROWTH

## 2013-10-27 NOTE — Telephone Encounter (Signed)
Relevant patient education assigned to patient using Emmi. ° °

## 2013-11-01 ENCOUNTER — Other Ambulatory Visit: Payer: Self-pay | Admitting: *Deleted

## 2013-11-01 DIAGNOSIS — M2550 Pain in unspecified joint: Secondary | ICD-10-CM

## 2013-11-01 MED ORDER — ETODOLAC 400 MG PO TABS: 400.0000 mg | ORAL_TABLET | Freq: Two times a day (BID) | ORAL | Status: DC

## 2014-01-10 ENCOUNTER — Encounter: Payer: Medicare HMO | Admitting: Internal Medicine

## 2014-01-14 ENCOUNTER — Telehealth: Payer: Self-pay | Admitting: Internal Medicine

## 2014-01-14 NOTE — Telephone Encounter (Signed)
Pt has appt 5/29 to follow up recent testing.  States she only has #3 pills left of Toviaz samples that were given previously.  Asking for a script, at least enough to get her to appt 5/29.  Pt states she was unaware of appt scheduled 5/4 and thought she was to come back after she had completed testing to discuss results.  States she took [employer] before [place] and cannot tell a difference between them as far as one working better than the other, so will continue Toviaz.

## 2014-01-14 NOTE — Telephone Encounter (Signed)
OK. Fine to call in 30 days supply of her Rx for Toviaz.

## 2014-01-17 MED ORDER — FESOTERODINE FUMARATE ER 8 MG PO TB24: 8.0000 mg | ORAL_TABLET | Freq: Every day | ORAL | Status: DC

## 2014-01-17 NOTE — Telephone Encounter (Signed)
Verified with pt she has been taking Toviaz 8 mg. Would like Rx sent to [facility]. Rx sent to pharmacy by escript, pt aware.

## 2014-02-04 ENCOUNTER — Encounter: Payer: Self-pay | Admitting: Internal Medicine

## 2014-02-04 ENCOUNTER — Ambulatory Visit (INDEPENDENT_AMBULATORY_CARE_PROVIDER_SITE_OTHER): Payer: Medicare HMO | Admitting: Internal Medicine

## 2014-02-04 VITALS — BP 136/76 | HR 74 | Temp 98.6°F | Ht 67.5 in | Wt 181.0 lb

## 2014-02-04 DIAGNOSIS — R5381 Other malaise: Secondary | ICD-10-CM

## 2014-02-04 DIAGNOSIS — E663 Overweight: Secondary | ICD-10-CM

## 2014-02-04 DIAGNOSIS — R32 Unspecified urinary incontinence: Secondary | ICD-10-CM

## 2014-02-04 DIAGNOSIS — R5383 Other fatigue: Secondary | ICD-10-CM

## 2014-02-04 DIAGNOSIS — I10 Essential (primary) hypertension: Secondary | ICD-10-CM

## 2014-02-04 LAB — TSH: TSH: 0.38 u[IU]/mL (ref 0.35–4.50)

## 2014-02-04 LAB — CBC WITH DIFFERENTIAL/PLATELET
Basophils Absolute: 0 10*3/uL (ref 0.0–0.1)
Basophils Relative: 0.5 % (ref 0.0–3.0)
EOS PCT: 1.7 % (ref 0.0–5.0)
Eosinophils Absolute: 0.1 10*3/uL (ref 0.0–0.7)
HEMATOCRIT: 44.6 % (ref 36.0–46.0)
HEMOGLOBIN: 15.1 g/dL — AB (ref 12.0–15.0)
LYMPHS ABS: 2.8 10*3/uL (ref 0.7–4.0)
Lymphocytes Relative: 39.3 % (ref 12.0–46.0)
MCHC: 33.9 g/dL (ref 30.0–36.0)
MCV: 92.7 fl (ref 78.0–100.0)
MONOS PCT: 8.6 % (ref 3.0–12.0)
Monocytes Absolute: 0.6 10*3/uL (ref 0.1–1.0)
NEUTROS ABS: 3.5 10*3/uL (ref 1.4–7.7)
Neutrophils Relative %: 49.9 % (ref 43.0–77.0)
Platelets: 241 10*3/uL (ref 150.0–400.0)
RBC: 4.81 Mil/uL (ref 3.87–5.11)
RDW: 13.1 % (ref 11.5–15.5)
WBC: 7 10*3/uL (ref 4.0–10.5)

## 2014-02-04 LAB — COMPREHENSIVE METABOLIC PANEL
ALT: 22 U/L (ref 0–35)
AST: 18 U/L (ref 0–37)
Albumin: 4.4 g/dL (ref 3.5–5.2)
Alkaline Phosphatase: 65 U/L (ref 39–117)
BILIRUBIN TOTAL: 0.8 mg/dL (ref 0.2–1.2)
BUN: 18 mg/dL (ref 6–23)
CO2: 29 meq/L (ref 19–32)
CREATININE: 1 mg/dL (ref 0.4–1.2)
Calcium: 9.9 mg/dL (ref 8.4–10.5)
Chloride: 103 mEq/L (ref 96–112)
GFR: 59.13 mL/min — AB (ref >60.00)
GLUCOSE: 97 mg/dL (ref 70–99)
Potassium: 4.5 mEq/L (ref 3.5–5.1)
Sodium: 139 mEq/L (ref 135–145)
Total Protein: 7.4 g/dL (ref 6.0–8.3)

## 2014-02-04 LAB — VITAMIN B12: Vitamin B-12: 746 pg/mL (ref 211–911)

## 2014-02-04 MED ORDER — BUPROPION HCL ER (XL) 150 MG PO TB24: 150.0000 mg | ORAL_TABLET | Freq: Every day | ORAL | Status: DC

## 2014-02-04 MED ORDER — LISINOPRIL 20 MG PO TABS: 20.0000 mg | ORAL_TABLET | Freq: Every day | ORAL | Status: DC

## 2014-02-04 NOTE — Assessment & Plan Note (Signed)
Persistent symptoms of fatigue. Will recheck B12 and TSH with labs today. Encouraged healthy diet and exercise. Consider sleep study if symptoms persistent

## 2014-02-04 NOTE — Assessment & Plan Note (Signed)
Reviewed recent notes from [facility]. Will complete pessary fitting. Continue Toviaz. Follow up in 2 months and prn.

## 2014-02-04 NOTE — Progress Notes (Signed)
Subjective:    Patient ID: Monica Larson, female    DOB: [birth year], 68 y.o.   MRN: [id number]  HPI 68YO female with urinary incontinence presents for follow up.  Recently evaluated by urogynecology. Decided to try pessary. Scheduled to have one fit. Concerned about pain with pessary.  HTN - BP recently elevated. 170s SBP. Compliant with meds. No chest pain headache or other symptoms.  Concerned about weight. Trying to limit caloric intake. Not currently exercising.  Also concerned about persistent fatigue. Questions if B12 injections have really helped.   Review of Systems  Constitutional: Negative for fever, chills, appetite change, fatigue and unexpected weight change.  HENT: Negative for congestion, ear pain, sinus pressure, sore throat, trouble swallowing and voice change.   Eyes: Negative for visual disturbance.  Respiratory: Negative for cough, shortness of breath, wheezing and stridor.   Cardiovascular: Negative for chest pain, palpitations and leg swelling.  Gastrointestinal: Negative for nausea, vomiting, abdominal pain, diarrhea, constipation, blood in stool, abdominal distention and anal bleeding.  Genitourinary: Negative for dysuria, urgency, frequency, flank pain, decreased urine volume and pelvic pain.  Musculoskeletal: Negative for arthralgias, gait problem, myalgias and neck pain.  Skin: Negative for color change and rash.  Neurological: Negative for dizziness and headaches.  Hematological: Negative for adenopathy. Does not bruise/bleed easily.  Psychiatric/Behavioral: Negative for suicidal ideas, sleep disturbance and dysphoric mood. The patient is not nervous/anxious.        Objective:    BP 136/76  Pulse 74  Temp(Src) 98.6 F (37 C) (Oral)  Ht 5' 7.5" (1.715 m)  Wt 181 lb (82.101 kg)  BMI 27.91 kg/m2  SpO2 96% Physical Exam  Constitutional: She is oriented to person, place, and time. She appears well-developed and well-nourished. No distress.  HENT:    Head: Normocephalic and atraumatic.  Right Ear: External ear normal.  Left Ear: External ear normal.  Nose: Nose normal.  Mouth/Throat: Oropharynx is clear and moist. No oropharyngeal exudate.  Eyes: Conjunctivae are normal. Pupils are equal, round, and reactive to light. Right eye exhibits no discharge. Left eye exhibits no discharge. No scleral icterus.  Neck: Normal range of motion. Neck supple. No tracheal deviation present. No thyromegaly present.  Cardiovascular: Normal rate, regular rhythm, normal heart sounds and intact distal pulses.  Exam reveals no gallop and no friction rub.   No murmur heard. Pulmonary/Chest: Effort normal and breath sounds normal. No accessory muscle usage. Not tachypneic. No respiratory distress. She has no decreased breath sounds. She has no wheezes. She has no rhonchi. She has no rales. She exhibits no tenderness.  Musculoskeletal: Normal range of motion. She exhibits no edema and no tenderness.  Lymphadenopathy:    She has no cervical adenopathy.  Neurological: She is alert and oriented to person, place, and time. No cranial nerve deficit. She exhibits normal muscle tone. Coordination normal.  Skin: Skin is warm and dry. No rash noted. She is not diaphoretic. No erythema. No pallor.  Psychiatric: She has a normal mood and affect. Her behavior is normal. Judgment and thought content normal.          Assessment & Plan:   Problem List Items Addressed This Visit     Unprioritized   Hypertension - Primary      BP Readings from Last 3 Encounters:  02/04/14 136/76  10/25/13 150/80  07/29/13 122/66   BP well controlled today, but has been elevated at home. Encouraged healthy diet and exercise. Will continue current medications. Recheck BP at visit  in 2 months.    Relevant Medications      lisinopril (PRINIVIL,ZESTRIL) tablet   Other Relevant Orders      Comprehensive metabolic panel   Other malaise and fatigue     Persistent symptoms of fatigue.  Will recheck B12 and TSH with labs today. Encouraged healthy diet and exercise. Consider sleep study if symptoms persistent    Relevant Orders      CBC with Differential      TSH      B12   Overweight (BMI 25.0-29.9)      Wt Readings from Last 3 Encounters:  02/04/14 181 lb (82.101 kg)  10/25/13 187 lb (84.823 kg)  07/29/13 187 lb (84.823 kg)   Encouraged healthy diet and exercise. Discussed medications to help with appetite suppression. Not a good candidate for Phentermine because of BP issues. Other medications not affordable. Will try Wellbutrin. Follow up 2 months.    Relevant Medications      buPROPion (WELLBUTRIN XL) 24 hr tablet   Urinary incontinence     Reviewed recent notes from [facility]. Will complete pessary fitting. Continue Toviaz. Follow up in 2 months and prn.        Return in about 2 months (around 04/06/2014) for Wellness Visit.

## 2014-02-04 NOTE — Patient Instructions (Signed)
Start Wellbutrin 150mg  every morning to help with appetite.  Follow up in 03/2014 for Wellness Visit.

## 2014-02-04 NOTE — Assessment & Plan Note (Signed)
Wt Readings from Last 3 Encounters:  02/04/14 181 lb (82.101 kg)  10/25/13 187 lb (84.823 kg)  07/29/13 187 lb (84.823 kg)   Encouraged healthy diet and exercise. Discussed medications to help with appetite suppression. Not a good candidate for Phentermine because of BP issues. Other medications not affordable. Will try Wellbutrin. Follow up 2 months.

## 2014-02-04 NOTE — Progress Notes (Signed)
Pre visit review using our clinic review tool, if applicable. No additional management support is needed unless otherwise documented below in the visit note. 

## 2014-02-04 NOTE — Assessment & Plan Note (Signed)
BP Readings from Last 3 Encounters:  02/04/14 136/76  10/25/13 150/80  07/29/13 122/66   BP well controlled today, but has been elevated at home. Encouraged healthy diet and exercise. Will continue current medications. Recheck BP at visit in 2 months.

## 2014-02-10 ENCOUNTER — Telehealth: Payer: Self-pay | Admitting: *Deleted

## 2014-02-10 MED ORDER — FESOTERODINE FUMARATE ER 8 MG PO TB24: 8.0000 mg | ORAL_TABLET | Freq: Every day | ORAL | Status: DC

## 2014-02-10 NOTE — Telephone Encounter (Signed)
Refill Request  Lisbeth Ply

## 2014-02-10 NOTE — Telephone Encounter (Signed)
Rx sent to pharmacy by escript  

## 2014-02-10 NOTE — Telephone Encounter (Signed)
Ok to fill 

## 2014-02-10 NOTE — Telephone Encounter (Signed)
Fine to fill. 

## 2014-02-11 ENCOUNTER — Other Ambulatory Visit: Payer: Self-pay | Admitting: Internal Medicine

## 2014-02-11 ENCOUNTER — Telehealth: Payer: Self-pay | Admitting: Internal Medicine

## 2014-02-11 NOTE — Telephone Encounter (Signed)
Left vm advising pt that the Pharmacy already sent [place] a request and it was filled yesterday.

## 2014-02-11 NOTE — Telephone Encounter (Signed)
Patient needs refill on toviaz. Patient states that she only has 3 pills left. Please call when rx is ready/msn

## 2014-04-11 ENCOUNTER — Ambulatory Visit: Payer: Medicare HMO | Admitting: Internal Medicine

## 2014-04-22 ENCOUNTER — Ambulatory Visit: Payer: Medicare HMO | Admitting: Internal Medicine

## 2014-04-26 ENCOUNTER — Ambulatory Visit (INDEPENDENT_AMBULATORY_CARE_PROVIDER_SITE_OTHER): Payer: Medicare HMO | Admitting: Internal Medicine

## 2014-04-26 ENCOUNTER — Encounter: Payer: Self-pay | Admitting: Internal Medicine

## 2014-04-26 VITALS — BP 130/62 | HR 77 | Temp 98.2°F | Ht 67.5 in | Wt 185.5 lb

## 2014-04-26 DIAGNOSIS — N390 Urinary tract infection, site not specified: Secondary | ICD-10-CM

## 2014-04-26 LAB — POCT URINALYSIS DIPSTICK
GLUCOSE UA: NEGATIVE
Ketones, UA: NEGATIVE
Nitrite, UA: POSITIVE
SPEC GRAV UA: 1.02
UROBILINOGEN UA: 0.2
pH, UA: 5.5

## 2014-04-26 MED ORDER — CIPROFLOXACIN HCL 500 MG PO TABS: 500.0000 mg | ORAL_TABLET | Freq: Two times a day (BID) | ORAL | Status: DC

## 2014-04-26 NOTE — Progress Notes (Signed)
Pre visit review using our clinic review tool, if applicable. No additional management support is needed unless otherwise documented below in the visit note. 

## 2014-04-26 NOTE — Assessment & Plan Note (Signed)
Symptoms and UA c/w UTI. Start cipro. Urine sent for culture. Encouraged fluid intake. Continue Azo. Follow up prn if symptoms are not improving.

## 2014-04-26 NOTE — Progress Notes (Signed)
   Subjective:    Patient ID: Monica Larson, female    DOB: 05-Jul-1946, 68 y.o.   MRN: [id number]  HPI 68YO female presents for acute visit.  Started having urinary urgency and frequency this weekend. Up all night last night with pain. No fever, but having chills. No gross hematuria. Tried Azo with no improvement.  Review of Systems  Constitutional: Positive for chills. Negative for fever and fatigue.  Gastrointestinal: Negative for nausea, vomiting, abdominal pain, diarrhea, constipation and rectal pain.  Genitourinary: Positive for dysuria, urgency and frequency. Negative for hematuria, flank pain, decreased urine volume, vaginal bleeding, vaginal discharge, difficulty urinating, vaginal pain and pelvic pain.       Objective:    BP 130/62  Pulse 77  Temp(Src) 98.2 F (36.8 C) (Oral)  Ht 5' 7.5" (1.715 m)  Wt 185 lb 8 oz (84.142 kg)  BMI 28.61 kg/m2  SpO2 98% Physical Exam  Constitutional: She is oriented to person, place, and time. She appears well-developed and well-nourished. No distress.  HENT:  Head: Normocephalic and atraumatic.  Right Ear: External ear normal.  Left Ear: External ear normal.  Nose: Nose normal.  Mouth/Throat: Oropharynx is clear and moist.  Eyes: Conjunctivae are normal. Pupils are equal, round, and reactive to light. Right eye exhibits no discharge. Left eye exhibits no discharge. No scleral icterus.  Neck: Normal range of motion. Neck supple. No tracheal deviation present. No thyromegaly present.  Cardiovascular: Normal rate, regular rhythm, normal heart sounds and intact distal pulses.  Exam reveals no gallop and no friction rub.   No murmur heard. Pulmonary/Chest: Effort normal and breath sounds normal. No accessory muscle usage. Not tachypneic. No respiratory distress. She has no decreased breath sounds. She has no wheezes. She has no rhonchi. She has no rales. She exhibits no tenderness.  Abdominal: There is no tenderness (no CVA tenderness).    Musculoskeletal: Normal range of motion. She exhibits no edema and no tenderness.  Lymphadenopathy:    She has no cervical adenopathy.  Neurological: She is alert and oriented to person, place, and time. No cranial nerve deficit. She exhibits normal muscle tone. Coordination normal.  Skin: Skin is warm and dry. No rash noted. She is not diaphoretic. No erythema. No pallor.  Psychiatric: She has a normal mood and affect. Her behavior is normal. Judgment and thought content normal.          Assessment & Plan:   Problem List Items Addressed This Visit     Unprioritized   Urinary tract infection, site not specified - Primary     Symptoms and UA c/w UTI. Start cipro. Urine sent for culture. Encouraged fluid intake. Continue Azo. Follow up prn if symptoms are not improving.    Relevant Medications      ciprofloxacin (CIPRO) tablet   Other Relevant Orders      Urine culture      POCT urinalysis dipstick (Completed)       Return if symptoms worsen or fail to improve.

## 2014-04-26 NOTE — Patient Instructions (Signed)

## 2014-04-29 LAB — URINE CULTURE

## 2014-05-03 ENCOUNTER — Telehealth: Payer: Self-pay | Admitting: *Deleted

## 2014-05-03 NOTE — Telephone Encounter (Signed)
Let's have her return to clinic and recheck a UA to see if persistent signs of infection. The urine culture was initially sensitive to Cipro, but we may need to extend her course of antibiotics.

## 2014-05-03 NOTE — Telephone Encounter (Signed)
Pt called states she continues to have UTI symptoms and she has completed her antibiotic, she is requesting another antibiotic be called in.  Please advise

## 2014-05-04 ENCOUNTER — Other Ambulatory Visit (INDEPENDENT_AMBULATORY_CARE_PROVIDER_SITE_OTHER): Payer: Medicare HMO

## 2014-05-04 ENCOUNTER — Other Ambulatory Visit: Payer: Self-pay | Admitting: *Deleted

## 2014-05-04 DIAGNOSIS — N390 Urinary tract infection, site not specified: Secondary | ICD-10-CM

## 2014-05-04 LAB — URINALYSIS, ROUTINE W REFLEX MICROSCOPIC
Leukocytes, UA: NEGATIVE
Nitrite: NEGATIVE
Specific Gravity, Urine: >=1.03 — AB (ref 1.000–1.030)
URINE GLUCOSE: NEGATIVE
Urobilinogen, UA: 0.2 (ref 0.0–1.0)
pH: 5 (ref 5.0–8.0)

## 2014-05-04 MED ORDER — CIPROFLOXACIN HCL 500 MG PO TABS: 500.0000 mg | ORAL_TABLET | Freq: Two times a day (BID) | ORAL | Status: DC

## 2014-05-04 NOTE — Telephone Encounter (Signed)
Spoke with pt, she starts she has a sterile urine cup and will bring in specimen.

## 2014-05-04 NOTE — Addendum Note (Signed)
Addended by: Karlene Einstein D on: 05/04/2014 02:21 PM   Modules accepted: Orders

## 2014-05-06 ENCOUNTER — Telehealth: Payer: Self-pay | Admitting: Internal Medicine

## 2014-05-06 LAB — CULTURE, URINE COMPREHENSIVE
Colony Count: NO GROWTH
Organism ID, Bacteria: NO GROWTH

## 2014-05-06 NOTE — Telephone Encounter (Signed)
Urine culture negative.

## 2014-05-19 ENCOUNTER — Encounter: Payer: Self-pay | Admitting: Adult Health

## 2014-05-19 ENCOUNTER — Ambulatory Visit (INDEPENDENT_AMBULATORY_CARE_PROVIDER_SITE_OTHER): Payer: Medicare HMO | Admitting: Adult Health

## 2014-05-19 VITALS — BP 128/68 | HR 75 | Temp 97.9°F | Resp 14 | Wt 187.0 lb

## 2014-05-19 DIAGNOSIS — M25539 Pain in unspecified wrist: Secondary | ICD-10-CM

## 2014-05-19 DIAGNOSIS — M25531 Pain in right wrist: Secondary | ICD-10-CM

## 2014-05-19 NOTE — Progress Notes (Signed)
Patient ID: Monica Larson, female   DOB: July 05, 1946, 68 y.o.   MRN: [id number]   Subjective:    Patient ID: Monica Larson, female    DOB: 04/27/1946, 68 y.o.   MRN: [id number]  HPI  Pt presents with right wrist pain that started 2 weeks ago. She has hx of right carpel tunnel surgery. She works as a Building control surveyor which involves pushing, pulling etc. She is right handed. Lifts bags with right hand often. Ice made better. Aches at night "like a tooth ache". Pulling and lifting make it worse. Takes lodeine in the morning. Forgets evening dose. Has been taking Aleve which help for a while.   Past Medical History  Diagnosis Date  . HTN (hypertension)   . Asthma   . HLD (hyperlipidemia)     borderline  . GERD (gastroesophageal reflux disease)     Current Outpatient Prescriptions on File Prior to Visit  Medication Sig Dispense Refill  . aspirin (ASPIR-LOW) 81 MG EC tablet Take 81 mg by mouth daily.        . cyanocobalamin (,VITAMIN B-12,) 1000 MCG/ML injection Inject 1 mL (1,000 mcg total) into the muscle every 30 (thirty) days.  10 mL  6  . DiphenhydrAMINE HCl (BENADRYL ALLERGY PO) Take 1 tablet by mouth daily as needed.        . etodolac (LODINE) 400 MG tablet Take 1 tablet (400 mg total) by mouth 2 (two) times daily.  180 tablet  2  . fesoterodine (TOVIAZ) 8 MG TB24 tablet Take 1 tablet (8 mg total) by mouth daily.  90 tablet  1  . lisinopril (PRINIVIL,ZESTRIL) 20 MG tablet Take 1 tablet (20 mg total) by mouth daily.  90 tablet  3  . TOVIAZ 8 MG TB24 tablet TAKE 1 TABLET (8 MG TOTAL) BY MOUTH DAILY.  30 tablet  5  . traZODone (DESYREL) 100 MG tablet Take 0.5 tablets (50 mg total) by mouth at bedtime.  30 tablet  6  . buPROPion (WELLBUTRIN XL) 150 MG 24 hr tablet Take 1 tablet (150 mg total) by mouth daily.  30 tablet  3   No current facility-administered medications on file prior to visit.     Review of Systems  Constitutional: Negative.   Eyes: Negative.   Cardiovascular: Negative.     Gastrointestinal: Negative.   Genitourinary: Negative.   Musculoskeletal:       Right wrist pain  Skin: Negative.   Neurological: Negative for numbness.  Hematological: Negative.   Psychiatric/Behavioral: Negative.   All other systems reviewed and are negative.      Objective:  BP 128/68  Pulse 75  Temp(Src) 97.9 F (36.6 C) (Oral)  Wt 187 lb (84.823 kg)  SpO2 96%   Physical Exam  Constitutional: She is oriented to person, place, and time. No distress.  Cardiovascular: Normal rate and regular rhythm.   Pulmonary/Chest: Effort normal. No respiratory distress.  Musculoskeletal: She exhibits tenderness.  Pain with lifting, pulling. Pain with palpation of radial side of wrist.  Neurological: She is alert and oriented to person, place, and time.  Skin: Skin is warm and dry.  Psychiatric: She has a normal mood and affect. Her behavior is normal. Judgment and thought content normal.      Assessment & Plan:   1. Right wrist pain Suspect DeQuervain's tendonitis. Ice 3-4 times a day. Medication to reduce inflammation such as aleve or ibuprofen. Avoid lifting, pulling with your right hand.  I am referring you to Dr. [name]  for evaluation and possible injection.  - Ambulatory referral to Orthopedic Surgery

## 2014-05-19 NOTE — Progress Notes (Signed)
Pre visit review using our clinic review tool, if applicable. No additional management support is needed unless otherwise documented below in the visit note. 

## 2014-05-19 NOTE — Patient Instructions (Signed)
  De Quervain's Disease De Quervain's disease is a condition often seen in racquet sports where there is a soreness (inflammation) in the cord like structures (tendons) which attach muscle to bone on the thumb side of the wrist. There may be a tightening of the tissuesaround the tendons. This condition is often helped by giving up or modifying the activity which caused it. When conservative treatment does not help, surgery may be required. Conservative treatment could include changes in the activity which brought about the problem or made it worse. Anti-inflammatory medications and injections may be used to help decrease the inflammation and help with pain control. Your caregiver will help you determine which is best for you. DIAGNOSIS  Often the diagnosis (learning what is wrong) can be made by examination. Sometimes x-rays are required. HOME CARE INSTRUCTIONS   Apply ice to the sore area for 15-20 minutes, 03-04 times per day while awake. Put the ice in a plastic bag and place a towel between the bag of ice and your skin. This is especially helpful if it can be done after all activities involving the sore wrist.  Temporary splinting may help.  Only take over-the-counter or prescription medicines for pain, discomfort or fever as directed by your caregiver. SEEK MEDICAL CARE IF:   Pain relief is not obtained with medications, or if you have increasing pain and seem to be getting worse rather than better. MAKE SURE YOU:   Understand these instructions.  Will watch your condition.  Will get help right away if you are not doing well or get worse. Document Released: 05/21/2001 Document Revised: 11/18/2011 Document Reviewed: 12/29/2013 ExitCare Patient Information 2015 [employer]. This information is not intended to replace advice given to you by your health care provider. Make sure you discuss any questions you have with your health care provider.  

## 2014-05-25 ENCOUNTER — Encounter: Payer: Self-pay | Admitting: Family Medicine

## 2014-05-25 ENCOUNTER — Ambulatory Visit (INDEPENDENT_AMBULATORY_CARE_PROVIDER_SITE_OTHER): Payer: Medicare HMO | Admitting: Family Medicine

## 2014-05-25 VITALS — BP 120/60 | HR 62 | Temp 98.1°F | Ht 67.5 in | Wt 185.5 lb

## 2014-05-25 DIAGNOSIS — M19049 Primary osteoarthritis, unspecified hand: Secondary | ICD-10-CM

## 2014-05-25 DIAGNOSIS — M1811 Unilateral primary osteoarthritis of first carpometacarpal joint, right hand: Secondary | ICD-10-CM

## 2014-05-25 DIAGNOSIS — M25539 Pain in unspecified wrist: Secondary | ICD-10-CM

## 2014-05-25 DIAGNOSIS — M25531 Pain in right wrist: Secondary | ICD-10-CM

## 2014-05-25 MED ORDER — METHYLPREDNISOLONE ACETATE 40 MG/ML IJ SUSP
20.0000 mg | Freq: Once | INTRAMUSCULAR | Status: AC
  Administered 2014-05-25: 20 mg via INTRA_ARTICULAR

## 2014-05-25 NOTE — Progress Notes (Signed)
Dr. Frederico Hamman T. Kelleen Stolze, MD, [facility] [place], [address] Phone: [phone] Fax: [phone]  05/25/2014  Patient: Monica Larson, MRN: [id number], DOB: 09/04/1946, 68 y.o.  Primary Physician:  Rica Mast, MD  Chief Complaint: Wrist Pain  Subjective:   Monica Larson is a 68 y.o. very pleasant female patient who presents with the following:  Requesting Provider: Saunders Glance, NP  Pleasant right-hand dominant female who presents with a three-week history of right-sided hand pain this is concentrated around the thumb region and more at the base of the thumb. She has no known trauma or accident she can think of. 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 She has been using her hand a lot with work and life over the last 71 years. She does have some baseline arthritis throughout, and she has been taking some Tylenol, Lodine, and she also has a thumb spica splint. She has been doing all this for the last 3 weeks or so.  Past Medical History, Surgical History, Social History, Family History, Problem List, Medications, and Allergies have been reviewed and updated if relevant.  GEN: No fevers, chills. Nontoxic. Primarily MSK c/o today. MSK: Detailed in the HPI GI: tolerating PO intake without difficulty Neuro: No numbness, parasthesias, or tingling associated. Otherwise the pertinent positives of the ROS are noted above.   Objective:   BP 120/60  Pulse 62  Temp(Src) 98.1 F (36.7 C) (Oral)  Ht 5' 7.5" (1.715 m)  Wt 185 lb 8 oz (84.142 kg)  BMI 28.61 kg/m2   GEN: WDWN, NAD, Non-toxic, Alert & Oriented x 3 HEENT: Atraumatic, Normocephalic.  Ears and Nose: No external deformity. EXTR: No clubbing/cyanosis/edema NEURO: Normal gait.  PSYCH: Normally interactive. Conversant. Not depressed or anxious appearing.  Calm demeanor.   Hand:  R Ecchymosis or edema: neg ROM wrist/hand/digits/elbow: Approximate 20% loss of flexion and extension in the affected hand at the wrist. Carpals, MCP's, digits: diffuse heberdens nodes Distal Ulna and Radius: NT Ecchymosis or edema: neg CMC joint tender volarly and dorsally on the R with minimal wasting Cysts/nodules: neg Finkelstein's test: neg Snuffbox tenderness: neg Scaphoid tubercle: NT Hook of Hamate: NT Full composite fist Grip, all digits: 5/5 str No tenosynovitis Axial load test: neg Atrophy: neg  Hand sensation: intact   Radiology: No results found.  Assessment and Plan:   Primary osteoarthritis of first carpometacarpal joint of right hand  Wrist pain, right - Plan: methylPREDNISolone acetate (DEPO-MEDROL) injection 20 mg  Mostly basal joint OA of the R hand and to a lesser degree 1st dorsal compartmental tenosynovitis and mild flexor tendon sheath tenosynovitis on the r 1st.  Continue NSAIDS, thumb spica  CMC joint injection, RIGHT Verbal consent was obtained. Risks (including rare infection, pain), benefits, and alternatives were discussed. Prepped with Chloraprep and Ethyl Chloride used for anesthesia. Under sterile conditions, patient injected into 1st CMC joint at joint line in apex of snuffbox inserting perpendicularly with traction placed on thumb. Aspiration yields no blood. Decreased pain post injection. No complications. Needle size: 25 gauge Injection: 1/2 cc of Lidocaine 1% and Depo-Medrol 20 mg   Follow-up: prn  Signed,  Maurissa Ambrose T. Yussuf Sawyers, MD   Patient's Medications  New Prescriptions   No medications on file  Previous Medications   ASPIRIN (ASPIR-LOW) 81 MG EC TABLET    Take 81 mg by mouth daily.  DIPHENHYDRAMINE HCL (BENADRYL ALLERGY PO)    Take 1 tablet by mouth daily as needed.     ETODOLAC (LODINE) 400 MG TABLET    Take 1 tablet (400 mg total) by mouth 2 (two) times daily.   FESOTERODINE (TOVIAZ) 8 MG TB24 TABLET    Take 1 tablet (8  mg total) by mouth daily.   LISINOPRIL (PRINIVIL,ZESTRIL) 20 MG TABLET    Take 1 tablet (20 mg total) by mouth daily.   MULTIPLE VITAMINS-MINERALS (MULTIVITAMIN WITH MINERALS) TABLET    Take 1 tablet by mouth daily.   OMEGA-3 FATTY ACIDS (FISH OIL) 500 MG CAPS    Take 1,000 mg by mouth daily.   VITAMIN B-12 (CYANOCOBALAMIN) 1000 MCG TABLET    Take 1,000 mcg by mouth daily.  Modified Medications   Modified Medication Previous Medication   TRAZODONE (DESYREL) 100 MG TABLET traZODone (DESYREL) 100 MG tablet      Take 50 mg by mouth at bedtime as needed.    Take 0.5 tablets (50 mg total) by mouth at bedtime.  Discontinued Medications   CYANOCOBALAMIN (,VITAMIN B-12,) 1000 MCG/ML INJECTION    Inject 1 mL (1,000 mcg total) into the muscle every 30 (thirty) days.

## 2014-05-25 NOTE — Progress Notes (Signed)
Pre visit review using our clinic review tool, if applicable. No additional management support is needed unless otherwise documented below in the visit note. 

## 2014-07-04 ENCOUNTER — Telehealth: Payer: Self-pay | Admitting: Internal Medicine

## 2014-07-04 NOTE — Telephone Encounter (Signed)
Pt needs referral for [place] with Dr. Louisa Second. Pt has appt. On 08/09/14. Please advise pt/msn

## 2014-07-05 NOTE — Telephone Encounter (Signed)
Monica Larson, Has this been approved through [facility]?

## 2014-07-05 NOTE — Telephone Encounter (Signed)
The patient called back asking for a status update on the referral.

## 2014-07-08 ENCOUNTER — Telehealth: Payer: Self-pay | Admitting: Internal Medicine

## 2014-07-08 NOTE — Telephone Encounter (Signed)
Ms. Phariss called saying she needs a referral. She said she's having surgery on December 15th at [facility]. The doctor is: Olean Ree. The codes she gave are: 572.88, 572.40, 520.00. She said she's going to [facility] and their ph# is: [phone]. She has [employer]. Please call the patient if needed. Pt ph# [phone] (cell)  /  [phone] (home) Thank you.

## 2014-07-11 NOTE — Telephone Encounter (Signed)
Done ,     [employer] Auth   Called pt

## 2014-07-21 ENCOUNTER — Telehealth: Payer: Self-pay | Admitting: Internal Medicine

## 2014-07-21 NOTE — Telephone Encounter (Signed)
The patient has been scheduled and is aware of her appointment at [facility] on 12.9.15.@1 :40.

## 2014-07-28 ENCOUNTER — Encounter: Payer: Self-pay | Admitting: Nurse Practitioner

## 2014-07-28 ENCOUNTER — Ambulatory Visit (INDEPENDENT_AMBULATORY_CARE_PROVIDER_SITE_OTHER): Payer: Medicare HMO | Admitting: Nurse Practitioner

## 2014-07-28 VITALS — BP 120/60 | HR 78 | Temp 98.0°F | Resp 14 | Ht 67.5 in | Wt 183.8 lb

## 2014-07-28 DIAGNOSIS — R350 Frequency of micturition: Secondary | ICD-10-CM

## 2014-07-28 DIAGNOSIS — C4491 Basal cell carcinoma of skin, unspecified: Secondary | ICD-10-CM | POA: Insufficient documentation

## 2014-07-28 DIAGNOSIS — N390 Urinary tract infection, site not specified: Secondary | ICD-10-CM

## 2014-07-28 DIAGNOSIS — R300 Dysuria: Secondary | ICD-10-CM | POA: Insufficient documentation

## 2014-07-28 LAB — POCT URINALYSIS DIPSTICK
GLUCOSE UA: NEGATIVE
Nitrite, UA: NEGATIVE
PH UA: 5.5
Protein, UA: 30
Urobilinogen, UA: 0.2

## 2014-07-28 MED ORDER — CIPROFLOXACIN HCL 500 MG PO TABS: 500.0000 mg | ORAL_TABLET | Freq: Two times a day (BID) | ORAL | Status: DC

## 2014-07-28 NOTE — Assessment & Plan Note (Signed)
Recurring issue. She was seen and treated by Dr. Gilford Rile 04/26/14. Today has same symptoms and UA c/w UTI. Urine was sent for culture. 1 more round of Cipro. She will be having bladder surgery 12/14 at [facility]. Lots of fluids, continue Azo, told to call if still having problems.

## 2014-07-28 NOTE — Progress Notes (Signed)
Pre visit review using our clinic review tool, if applicable. No additional management support is needed unless otherwise documented below in the visit note. 

## 2014-07-28 NOTE — Progress Notes (Signed)
Subjective:    Patient ID: Monica Larson, female    DOB: November 17, 1945, 68 y.o.   MRN: [id number]  HPI  Monica Larson is a  68 yo female with dysuria. This began 3 days ago. Symptoms include flank pain, frequency with little urine produced, chills yesterday, and she is leaving out of town tomorrow. She finds that water and pyridium help somewhat.   She sees Dr. Caprice Renshaw at [facility] for bladder surgery on Dec. 14th 2015. She will also be seeing her Dermatologist for a second removal of Basal Cell CA on her anterior right thigh, Dec. 1st.     BP 120/60 mmHg  Pulse 78  Temp(Src) 98 F (36.7 C) (Oral)  Resp 14  Ht 5' 7.5" (1.715 m)  Wt 183 lb 12 oz (83.348 kg)  BMI 28.34 kg/m2  SpO2 97%     Review of Systems  Constitutional: Positive for chills. Negative for fever, diaphoresis, fatigue and unexpected weight change.  Gastrointestinal: Negative for nausea, abdominal pain, diarrhea, blood in stool and abdominal distention.  Genitourinary: Positive for dysuria, urgency, frequency and flank pain. Negative for hematuria and enuresis.   Past Medical History  Diagnosis Date  . HTN (hypertension)   . Asthma   . HLD (hyperlipidemia)     borderline  . GERD (gastroesophageal reflux disease)     History   Social History  . Marital Status: Married    Spouse Name: N/A    Number of Children: N/A  . Years of Education: N/A   Occupational History  . Not on file.   Social History Main Topics  . Smoking status: Never Smoker   . Smokeless tobacco: Never Used     Comment: 1 cig every 3 months   . Alcohol Use: Yes     Comment: no abuse  . Drug Use: No  . Sexual Activity: Not on file   Other Topics Concern  . Not on file   Social History Narrative   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     Past Surgical History  Procedure Laterality Date  . Eye surgery      muscular as child  . Cholecystectomy    . Vesicovaginal fistula closure w/ tah    . Carpal tunnel release      right  wrist  . Skin cancer excision Right Wrist and thigh (x2)    Unsure of dates and Dermatologist    Family History  Problem Relation Age of Onset  . Ovarian cancer Mother   . Prostate cancer Father   . Coronary artery disease Father   . Diabetes Father   . Heart attack Brother     Allergies  Allergen Reactions  . Sulfa Antibiotics     hives  . Sulfonamide Derivatives     Current Outpatient Prescriptions on File Prior to Visit  Medication Sig Dispense Refill  . aspirin (ASPIR-LOW) 81 MG EC tablet Take 81 mg by mouth daily.      . DiphenhydrAMINE HCl (BENADRYL ALLERGY PO) Take 1 tablet by mouth daily as needed.      . etodolac (LODINE) 400 MG tablet Take 1 tablet (400 mg total) by mouth 2 (two) times daily. 180 tablet 2  . fesoterodine (TOVIAZ) 8 MG TB24 tablet Take 1 tablet (8 mg total) by mouth daily. 90 tablet 1  . lisinopril (PRINIVIL,ZESTRIL) 20 MG tablet Take 1 tablet (20 mg total) by mouth daily. 90 tablet 3  . Multiple Vitamins-Minerals (MULTIVITAMIN WITH MINERALS) tablet Take 1  tablet by mouth daily.    . Omega-3 Fatty Acids (FISH OIL) 500 MG CAPS Take 1,000 mg by mouth daily.    . traZODone (DESYREL) 100 MG tablet Take 50 mg by mouth at bedtime as needed.    . vitamin B-12 (CYANOCOBALAMIN) 1000 MCG tablet Take 1,000 mcg by mouth daily.     No current facility-administered medications on file prior to visit.       Objective:   Physical Exam  Constitutional: She is oriented to person, place, and time. She appears well-developed and well-nourished. No distress.  Cardiovascular: Normal rate, regular rhythm and normal heart sounds.   Pulmonary/Chest: Effort normal and breath sounds normal. She has no wheezes.  Abdominal: Soft. She exhibits no distension and no mass. There is no tenderness. There is no guarding and no CVA tenderness.  Neurological: She is alert and oriented to person, place, and time.  Skin: Skin is warm and dry. She is not diaphoretic.  Psychiatric: She  has a normal mood and affect. Her behavior is normal. Judgment and thought content normal.  Vitals reviewed.      Assessment & Plan:

## 2014-07-28 NOTE — Assessment & Plan Note (Signed)
Scheduled for surgery for bladder "tack up" 08/22/14 at [facility] with Dr. Caprice Renshaw.

## 2014-07-28 NOTE — Patient Instructions (Signed)
Nice to meet you!  Cipro 500 mg x 7 days take until completed Lots of water and Cranberry juice can be helpful  Call us if you develop fever (>101) or worsening symptoms

## 2014-07-30 LAB — URINE CULTURE
Colony Count: NO GROWTH
Organism ID, Bacteria: NO GROWTH

## 2014-08-17 ENCOUNTER — Ambulatory Visit: Payer: Self-pay | Admitting: Internal Medicine

## 2014-08-17 LAB — HM MAMMOGRAPHY

## 2014-08-18 ENCOUNTER — Encounter: Payer: Self-pay | Admitting: *Deleted

## 2014-09-01 ENCOUNTER — Encounter: Payer: Self-pay | Admitting: Internal Medicine

## 2014-09-02 ENCOUNTER — Other Ambulatory Visit: Payer: Self-pay | Admitting: Internal Medicine

## 2014-09-08 ENCOUNTER — Encounter: Payer: Self-pay | Admitting: *Deleted

## 2014-09-16 DIAGNOSIS — N390 Urinary tract infection, site not specified: Secondary | ICD-10-CM | POA: Diagnosis not present

## 2014-09-27 ENCOUNTER — Other Ambulatory Visit: Payer: Self-pay | Admitting: *Deleted

## 2014-09-27 DIAGNOSIS — Z1283 Encounter for screening for malignant neoplasm of skin: Secondary | ICD-10-CM

## 2014-10-14 DIAGNOSIS — L821 Other seborrheic keratosis: Secondary | ICD-10-CM | POA: Diagnosis not present

## 2014-10-14 DIAGNOSIS — D485 Neoplasm of uncertain behavior of skin: Secondary | ICD-10-CM | POA: Diagnosis not present

## 2014-10-14 DIAGNOSIS — L859 Epidermal thickening, unspecified: Secondary | ICD-10-CM | POA: Diagnosis not present

## 2014-10-28 DIAGNOSIS — N393 Stress incontinence (female) (male): Secondary | ICD-10-CM | POA: Diagnosis not present

## 2014-11-07 ENCOUNTER — Encounter: Payer: Self-pay | Admitting: Internal Medicine

## 2014-11-07 ENCOUNTER — Ambulatory Visit (INDEPENDENT_AMBULATORY_CARE_PROVIDER_SITE_OTHER): Payer: Commercial Managed Care - HMO | Admitting: Internal Medicine

## 2014-11-07 VITALS — BP 153/73 | HR 68 | Temp 97.5°F | Ht 66.25 in | Wt 185.0 lb

## 2014-11-07 DIAGNOSIS — Z0000 Encounter for general adult medical examination without abnormal findings: Secondary | ICD-10-CM | POA: Diagnosis not present

## 2014-11-07 DIAGNOSIS — I10 Essential (primary) hypertension: Secondary | ICD-10-CM | POA: Diagnosis not present

## 2014-11-07 DIAGNOSIS — Z23 Encounter for immunization: Secondary | ICD-10-CM

## 2014-11-07 DIAGNOSIS — E663 Overweight: Secondary | ICD-10-CM

## 2014-11-07 DIAGNOSIS — Z1231 Encounter for screening mammogram for malignant neoplasm of breast: Secondary | ICD-10-CM | POA: Insufficient documentation

## 2014-11-07 LAB — CBC WITH DIFFERENTIAL/PLATELET
Basophils Absolute: 0 10*3/uL (ref 0.0–0.1)
Basophils Relative: 0.6 % (ref 0.0–3.0)
Eosinophils Absolute: 0.2 10*3/uL (ref 0.0–0.7)
Eosinophils Relative: 3.4 % (ref 0.0–5.0)
HEMATOCRIT: 43.2 % (ref 36.0–46.0)
Hemoglobin: 14.6 g/dL (ref 12.0–15.0)
LYMPHS ABS: 2.1 10*3/uL (ref 0.7–4.0)
Lymphocytes Relative: 35.4 % (ref 12.0–46.0)
MCHC: 33.7 g/dL (ref 30.0–36.0)
MCV: 92.3 fl (ref 78.0–100.0)
MONO ABS: 0.5 10*3/uL (ref 0.1–1.0)
Monocytes Relative: 8.4 % (ref 3.0–12.0)
Neutro Abs: 3.1 10*3/uL (ref 1.4–7.7)
Neutrophils Relative %: 52.2 % (ref 43.0–77.0)
Platelets: 241 10*3/uL (ref 150.0–400.0)
RBC: 4.69 Mil/uL (ref 3.87–5.11)
RDW: 13.3 % (ref 11.5–15.5)
WBC: 5.9 10*3/uL (ref 4.0–10.5)

## 2014-11-07 LAB — LIPID PANEL
Cholesterol: 236 mg/dL — ABNORMAL HIGH (ref 0–200)
HDL: 56.6 mg/dL (ref >39.00)
LDL CALC: 163 mg/dL — AB (ref 0–99)
NONHDL: 179.4
Total CHOL/HDL Ratio: 4
Triglycerides: 81 mg/dL (ref 0.0–149.0)
VLDL: 16.2 mg/dL (ref 0.0–40.0)

## 2014-11-07 LAB — COMPREHENSIVE METABOLIC PANEL
ALK PHOS: 69 U/L (ref 39–117)
ALT: 15 U/L (ref 0–35)
AST: 14 U/L (ref 0–37)
Albumin: 4.1 g/dL (ref 3.5–5.2)
BUN: 18 mg/dL (ref 6–23)
CHLORIDE: 107 meq/L (ref 96–112)
CO2: 28 mEq/L (ref 19–32)
Calcium: 9.5 mg/dL (ref 8.4–10.5)
Creatinine, Ser: 0.94 mg/dL (ref 0.40–1.20)
GFR: 62.64 mL/min (ref >60.00)
Glucose, Bld: 103 mg/dL — ABNORMAL HIGH (ref 70–99)
Potassium: 4.4 mEq/L (ref 3.5–5.1)
SODIUM: 141 meq/L (ref 135–145)
TOTAL PROTEIN: 6.7 g/dL (ref 6.0–8.3)
Total Bilirubin: 0.6 mg/dL (ref 0.2–1.2)

## 2014-11-07 LAB — MICROALBUMIN / CREATININE URINE RATIO
CREATININE, U: 354.3 mg/dL
MICROALB UR: 2.4 mg/dL — AB (ref 0.0–1.9)
MICROALB/CREAT RATIO: 0.7 mg/g (ref 0.0–30.0)

## 2014-11-07 LAB — VITAMIN D 25 HYDROXY (VIT D DEFICIENCY, FRACTURES): VITD: 24.01 ng/mL — ABNORMAL LOW (ref 30.00–100.00)

## 2014-11-07 LAB — TSH: TSH: 1.01 u[IU]/mL (ref 0.35–4.50)

## 2014-11-07 MED ORDER — LISINOPRIL 40 MG PO TABS: 40.0000 mg | ORAL_TABLET | Freq: Every day | ORAL | Status: DC

## 2014-11-07 NOTE — Assessment & Plan Note (Signed)
BP Readings from Last 3 Encounters:  11/07/14 153/73  07/28/14 120/60  05/25/14 120/60   Will increase Lisinopril to 40mg  daily. Recheck K and Cr in 1 week. Recheck BP in 1 week with nurse visit.

## 2014-11-07 NOTE — Assessment & Plan Note (Signed)
General medical exam including breast exam normal today. PAP and pelvic deferred, as last PAP 2012 normal, and pt recently followed by urogynecology. Mammogram UTD and reviewed. Colonoscopy UTD. Encouraged healthy diet and exercise. Prevnar given today.

## 2014-11-07 NOTE — Progress Notes (Signed)
The patient is here for annual Medicare Wellness Examination and management of other chronic and acute problems.   The risk factors are reflected in the history.  The roster of all physicians providing medical care to patient - is listed in the Snapshot section of the chart.  Activities of daily living:   The patient is 100% independent in all ADLs: dressing, toileting, feeding as well as independent mobility. Patient lives with husband in a home, 1 story with hardwood, tile and carpeted floors.  Home safety :  The patient has smoke detectors in the home.  They wear seatbelts in their car. There are no firearms at home.  There is no violence in the home. They feel safe where they live.  Infectious Risks: There is no risks for hepatitis, STDs or HIV.  There is no  history of blood transfusion.  They have no travel history to infectious disease endemic areas of the world.  Additional Health Care Providers: The patient has seen their dentist in the last six months. Dentist - Dr. Caryn Bee They have seen their eye doctor in the last year. Opthalmologist - [facility] They deny hearing issues. They have deferred audiologic testing in the last year.   They do not  have excessive sun exposure. Discussed the need for sun protection: hats,long sleeves and use of sunscreen if there is significant sun exposure. Followed closely for BCC. Dermatologist - Dr.Jolly  Diet: the importance of a healthy diet is discussed. They do have a healthy diet.  The benefits of regular aerobic exercise were discussed. Not recently exercising.  Depression screen: there are no signs or vegative symptoms of depression- irritability, change in appetite, anhedonia, sadness/tearfullness.  Cognitive assessment: the patient manages all their financial and personal affairs and is actively engaged. They could relate day,date,year and events.  HCPOA - Husband, Francee Piccolo  The following portions of the patient's history  were reviewed and updated as appropriate: allergies, current medications, past family history, past medical history,  past surgical history, past social history and problem list.  Visual acuity was not assessed per patient preference as they have regular follow up with their ophthalmologist. Hearing and body mass index were assessed and reviewed.   During the course of the visit the patient was educated and counseled about appropriate screening and preventive services including : fall prevention , diabetes screening, nutrition counseling, colorectal cancer screening, and recommended immunizations.    ACUTE ISSUE: HTN - BP has been elevated when checked, typically 329-518A systolic. No chest pain, headache, palpitations. Compliant with medication.   BP Readings from Last 3 Encounters:  11/07/14 153/73  07/28/14 120/60  05/25/14 120/60    Review of Systems  Constitutional: Negative for fever, chills, appetite change, fatigue and unexpected weight change.  Eyes: Negative for visual disturbance.  Respiratory: Negative for shortness of breath.   Cardiovascular: Negative for chest pain and leg swelling.  Gastrointestinal: Negative for nausea, vomiting, abdominal pain, diarrhea and constipation.  Genitourinary: Negative for dysuria, urgency, frequency, hematuria, flank pain and difficulty urinating.  Musculoskeletal: Negative for myalgias and arthralgias.  Skin: Negative for color change and rash.  Hematological: Negative for adenopathy. Does not bruise/bleed easily.  Psychiatric/Behavioral: Negative for sleep disturbance and dysphoric mood. The patient is not nervous/anxious.    Wt Readings from Last 3 Encounters:  11/07/14 185 lb (83.915 kg)  07/28/14 183 lb 12 oz (83.348 kg)  05/25/14 185 lb 8 oz (84.142 kg)       Objective:    BP  153/73 mmHg  Pulse 68  Temp(Src) 97.5 F (36.4 C) (Oral)  Ht 5' 6.25" (1.683 m)  Wt 185 lb (83.915 kg)  BMI 29.63 kg/m2  SpO2 96% Physical Exam   Constitutional: She is oriented to person, place, and time. She appears well-developed and well-nourished. No distress.  HENT:  Head: Normocephalic and atraumatic.  Right Ear: External ear normal.  Left Ear: External ear normal.  Nose: Nose normal.  Mouth/Throat: Oropharynx is clear and moist. No oropharyngeal exudate.  Eyes: Conjunctivae are normal. Pupils are equal, round, and reactive to light. Right eye exhibits no discharge. Left eye exhibits no discharge. No scleral icterus.  Neck: Normal range of motion. Neck supple. No tracheal deviation present. No thyromegaly present.  Cardiovascular: Normal rate, regular rhythm, normal heart sounds and intact distal pulses.  Exam reveals no gallop and no friction rub.   No murmur heard. Pulmonary/Chest: Effort normal and breath sounds normal. No accessory muscle usage. No tachypnea. No respiratory distress. She has no decreased breath sounds. She has no wheezes. She has no rales. She exhibits no tenderness. Right breast exhibits no inverted nipple, no mass, no nipple discharge, no skin change and no tenderness. Left breast exhibits no inverted nipple, no mass, no nipple discharge, no skin change and no tenderness. Breasts are symmetrical.  Abdominal: Soft. Bowel sounds are normal. She exhibits no distension and no mass. There is no tenderness. There is no rebound and no guarding.  Musculoskeletal: Normal range of motion. She exhibits no edema or tenderness.  Lymphadenopathy:    She has no cervical adenopathy.  Neurological: She is alert and oriented to person, place, and time. No cranial nerve deficit. She exhibits normal muscle tone. Coordination normal.  Skin: Skin is warm and dry. No rash noted. She is not diaphoretic. No erythema. No pallor.  Psychiatric: She has a normal mood and affect. Her behavior is normal. Judgment and thought content normal.          Assessment & Plan:   Problem List Items Addressed This Visit      Unprioritized    Hypertension    BP Readings from Last 3 Encounters:  11/07/14 153/73  07/28/14 120/60  05/25/14 120/60   Will increase Lisinopril to 40mg  daily. Recheck K and Cr in 1 week. Recheck BP in 1 week with nurse visit.      Relevant Medications   lisinopril (PRINIVIL,ZESTRIL) tablet   Other Relevant Orders   Microalbumin / creatinine urine ratio   Medicare annual wellness visit, subsequent - Primary    General medical exam including breast exam normal today. PAP and pelvic deferred, as last PAP 2012 normal, and pt recently followed by urogynecology. Mammogram UTD and reviewed. Colonoscopy UTD. Encouraged healthy diet and exercise. Prevnar given today.      Relevant Orders   CBC with Differential/Platelet   Comprehensive metabolic panel   Lipid panel   Vit D  25 hydroxy (rtn osteoporosis monitoring)   TSH   Overweight (BMI 25.0-29.9)    Wt Readings from Last 3 Encounters:  11/07/14 185 lb (83.915 kg)  07/28/14 183 lb 12 oz (83.348 kg)  05/25/14 185 lb 8 oz (84.142 kg)   Body mass index is 29.63 kg/(m^2). The patient is asked to make an attempt to improve diet and exercise patterns to aid in medical management of this problem.           Return in about 6 months (around 05/08/2015) for Recheck.

## 2014-11-07 NOTE — Patient Instructions (Signed)
Increase Lisinopril to $RemoveBefor'40mg'nHgWnaEMdlsv$  daily.  Return to clinic to recheck labs in 1 week.  Health Maintenance Adopting a healthy lifestyle and getting preventive care can go a long way to promote health and wellness. Talk with your health care provider about what schedule of regular examinations is right for you. This is a good chance for you to check in with your provider about disease prevention and staying healthy. In between checkups, there are plenty of things you can do on your own. Experts have done a lot of research about which lifestyle changes and preventive measures are most likely to keep you healthy. Ask your health care provider for more information. WEIGHT AND DIET  Eat a healthy diet  Be sure to include plenty of vegetables, fruits, low-fat dairy products, and lean protein.  Do not eat a lot of foods high in solid fats, added sugars, or salt.  Get regular exercise. This is one of the most important things you can do for your health.  Most adults should exercise for at least 150 minutes each week. The exercise should increase your heart rate and make you sweat (moderate-intensity exercise).  Most adults should also do strengthening exercises at least twice a week. This is in addition to the moderate-intensity exercise.  Maintain a healthy weight  Body mass index (BMI) is a measurement that can be used to identify possible weight problems. It estimates body fat based on height and weight. Your health care provider can help determine your BMI and help you achieve or maintain a healthy weight.  For females 79 years of age and older:   A BMI below 18.5 is considered underweight.  A BMI of 18.5 to 24.9 is normal.  A BMI of 25 to 29.9 is considered overweight.  A BMI of 30 and above is considered obese.  Watch levels of cholesterol and blood lipids  You should start having your blood tested for lipids and cholesterol at 70 years of age, then have this test every 5 years.  You  may need to have your cholesterol levels checked more often if:  Your lipid or cholesterol levels are high.  You are older than 70 years of age.  You are at high risk for heart disease.  CANCER SCREENING   Lung Cancer  Lung cancer screening is recommended for adults 70-94 years old who are at high risk for lung cancer because of a history of smoking.  A yearly low-dose CT scan of the lungs is recommended for people who:  Currently smoke.  Have quit within the past 15 years.  Have at least a 30-pack-year history of smoking. A pack year is smoking an average of one pack of cigarettes a day for 1 year.  Yearly screening should continue until it has been 15 years since you quit.  Yearly screening should stop if you develop a health problem that would prevent you from having lung cancer treatment.  Breast Cancer  Practice breast self-awareness. This means understanding how your breasts normally appear and feel.  It also means doing regular breast self-exams. Let your health care provider know about any changes, no matter how small.  If you are in your 20s or 30s, you should have a clinical breast exam (CBE) by a health care provider every 1-3 years as part of a regular health exam.  If you are 30 or older, have a CBE every year. Also consider having a breast X-ray (mammogram) every year.  If you have a family history  of breast cancer, talk to your health care provider about genetic screening.  If you are at high risk for breast cancer, talk to your health care provider about having an MRI and a mammogram every year.  Breast cancer gene (BRCA) assessment is recommended for women who have family members with BRCA-related cancers. BRCA-related cancers include:  Breast.  Ovarian.  Tubal.  Peritoneal cancers.  Results of the assessment will determine the need for genetic counseling and BRCA1 and BRCA2 testing. Cervical Cancer Routine pelvic examinations to screen for  cervical cancer are no longer recommended for nonpregnant women who are considered low risk for cancer of the pelvic organs (ovaries, uterus, and vagina) and who do not have symptoms. A pelvic examination may be necessary if you have symptoms including those associated with pelvic infections. Ask your health care provider if a screening pelvic exam is right for you.   The Pap test is the screening test for cervical cancer for women who are considered at risk.  If you had a hysterectomy for a problem that was not cancer or a condition that could lead to cancer, then you no longer need Pap tests.  If you are older than 65 years, and you have had normal Pap tests for the past 10 years, you no longer need to have Pap tests.  If you have had past treatment for cervical cancer or a condition that could lead to cancer, you need Pap tests and screening for cancer for at least 20 years after your treatment.  If you no longer get a Pap test, assess your risk factors if they change (such as having a new sexual partner). This can affect whether you should start being screened again.  Some women have medical problems that increase their chance of getting cervical cancer. If this is the case for you, your health care provider may recommend more frequent screening and Pap tests.  The human papillomavirus (HPV) test is another test that may be used for cervical cancer screening. The HPV test looks for the virus that can cause cell changes in the cervix. The cells collected during the Pap test can be tested for HPV.  The HPV test can be used to screen women 10 years of age and older. Getting tested for HPV can extend the interval between normal Pap tests from three to five years.  An HPV test also should be used to screen women of any age who have unclear Pap test results.  After 70 years of age, women should have HPV testing as often as Pap tests.  Colorectal Cancer  This type of cancer can be detected and  often prevented.  Routine colorectal cancer screening usually begins at 70 years of age and continues through 70 years of age.  Your health care provider may recommend screening at an earlier age if you have risk factors for colon cancer.  Your health care provider may also recommend using home test kits to check for hidden blood in the stool.  A small camera at the end of a tube can be used to examine your colon directly (sigmoidoscopy or colonoscopy). This is done to check for the earliest forms of colorectal cancer.  Routine screening usually begins at age 20.  Direct examination of the colon should be repeated every 5-10 years through 70 years of age. However, you may need to be screened more often if early forms of precancerous polyps or small growths are found. Skin Cancer  Check your skin from head  to toe regularly.  Tell your health care provider about any new moles or changes in moles, especially if there is a change in a mole's shape or color.  Also tell your health care provider if you have a mole that is larger than the size of a pencil eraser.  Always use sunscreen. Apply sunscreen liberally and repeatedly throughout the day.  Protect yourself by wearing long sleeves, pants, a wide-brimmed hat, and sunglasses whenever you are outside. HEART DISEASE, DIABETES, AND HIGH BLOOD PRESSURE   Have your blood pressure checked at least every 1-2 years. High blood pressure causes heart disease and increases the risk of stroke.  If you are between 36 years and 70 years old, ask your health care provider if you should take aspirin to prevent strokes.  Have regular diabetes screenings. This involves taking a blood sample to check your fasting blood sugar level.  If you are at a normal weight and have a low risk for diabetes, have this test once every three years after 70 years of age.  If you are overweight and have a high risk for diabetes, consider being tested at a younger age or  more often. PREVENTING INFECTION  Hepatitis B  If you have a higher risk for hepatitis B, you should be screened for this virus. You are considered at high risk for hepatitis B if:  You were born in a country where hepatitis B is common. Ask your health care provider which countries are considered high risk.  Your parents were born in a high-risk country, and you have not been immunized against hepatitis B (hepatitis B vaccine).  You have HIV or AIDS.  You use needles to inject street drugs.  You live with someone who has hepatitis B.  You have had sex with someone who has hepatitis B.  You get hemodialysis treatment.  You take certain medicines for conditions, including cancer, organ transplantation, and autoimmune conditions. Hepatitis C  Blood testing is recommended for:  Everyone born from [birth year] through 1966.  Anyone with known risk factors for hepatitis C. Sexually transmitted infections (STIs)  You should be screened for sexually transmitted infections (STIs) including gonorrhea and chlamydia if:  You are sexually active and are younger than 70 years of age.  You are older than 70 years of age and your health care provider tells you that you are at risk for this type of infection.  Your sexual activity has changed since you were last screened and you are at an increased risk for chlamydia or gonorrhea. Ask your health care provider if you are at risk.  If you do not have HIV, but are at risk, it may be recommended that you take a prescription medicine daily to prevent HIV infection. This is called pre-exposure prophylaxis (PrEP). You are considered at risk if:  You are sexually active and do not regularly use condoms or know the HIV status of your partner(s).  You take drugs by injection.  You are sexually active with a partner who has HIV. Talk with your health care provider about whether you are at high risk of being infected with HIV. If you choose to begin PrEP,  you should first be tested for HIV. You should then be tested every 3 months for as long as you are taking PrEP.  PREGNANCY   If you are premenopausal and you may become pregnant, ask your health care provider about preconception counseling.  If you may become pregnant, take 400 to 800 micrograms (  mcg) of folic acid every day.  If you want to prevent pregnancy, talk to your health care provider about birth control (contraception). OSTEOPOROSIS AND MENOPAUSE   Osteoporosis is a disease in which the bones lose minerals and strength with aging. This can result in serious bone fractures. Your risk for osteoporosis can be identified using a bone density scan.  If you are 37 years of age or older, or if you are at risk for osteoporosis and fractures, ask your health care provider if you should be screened.  Ask your health care provider whether you should take a calcium or vitamin D supplement to lower your risk for osteoporosis.  Menopause may have certain physical symptoms and risks.  Hormone replacement therapy may reduce some of these symptoms and risks. Talk to your health care provider about whether hormone replacement therapy is right for you.  HOME CARE INSTRUCTIONS   Schedule regular health, dental, and eye exams.  Stay current with your immunizations.   Do not use any tobacco products including cigarettes, chewing tobacco, or electronic cigarettes.  If you are pregnant, do not drink alcohol.  If you are breastfeeding, limit how much and how often you drink alcohol.  Limit alcohol intake to no more than 1 drink per day for nonpregnant women. One drink equals 12 ounces of beer, 5 ounces of wine, or 1 ounces of hard liquor.  Do not use street drugs.  Do not share needles.  Ask your health care provider for help if you need support or information about quitting drugs.  Tell your health care provider if you often feel depressed.  Tell your health care provider if you have  ever been abused or do not feel safe at home. Document Released: 03/11/2011 Document Revised: 01/10/2014 Document Reviewed: 07/28/2013 [facility] Patient Information 2015 [place], [place]. This information is not intended to replace advice given to you by your health care provider. Make sure you discuss any questions you have with your health care provider.

## 2014-11-07 NOTE — Progress Notes (Signed)
Pre visit review using our clinic review tool, if applicable. No additional management support is needed unless otherwise documented below in the visit note. 

## 2014-11-07 NOTE — Addendum Note (Signed)
Addended by: Vernetta Honey on: 11/07/2014 10:38 AM   Modules accepted: Orders

## 2014-11-07 NOTE — Assessment & Plan Note (Signed)
Wt Readings from Last 3 Encounters:  11/07/14 185 lb (83.915 kg)  07/28/14 183 lb 12 oz (83.348 kg)  05/25/14 185 lb 8 oz (84.142 kg)   Body mass index is 29.63 kg/(m^2). The patient is asked to make an attempt to improve diet and exercise patterns to aid in medical management of this problem.

## 2014-11-09 DIAGNOSIS — H524 Presbyopia: Secondary | ICD-10-CM | POA: Diagnosis not present

## 2014-11-09 DIAGNOSIS — H5210 Myopia, unspecified eye: Secondary | ICD-10-CM | POA: Diagnosis not present

## 2014-12-19 DIAGNOSIS — N3941 Urge incontinence: Secondary | ICD-10-CM | POA: Diagnosis not present

## 2014-12-19 DIAGNOSIS — N393 Stress incontinence (female) (male): Secondary | ICD-10-CM | POA: Diagnosis not present

## 2014-12-30 DIAGNOSIS — N393 Stress incontinence (female) (male): Secondary | ICD-10-CM | POA: Diagnosis not present

## 2014-12-30 DIAGNOSIS — N952 Postmenopausal atrophic vaginitis: Secondary | ICD-10-CM | POA: Diagnosis not present

## 2015-01-30 DIAGNOSIS — Z85828 Personal history of other malignant neoplasm of skin: Secondary | ICD-10-CM | POA: Diagnosis not present

## 2015-01-30 DIAGNOSIS — L538 Other specified erythematous conditions: Secondary | ICD-10-CM | POA: Diagnosis not present

## 2015-01-30 DIAGNOSIS — L820 Inflamed seborrheic keratosis: Secondary | ICD-10-CM | POA: Diagnosis not present

## 2015-03-23 ENCOUNTER — Ambulatory Visit (INDEPENDENT_AMBULATORY_CARE_PROVIDER_SITE_OTHER): Payer: Medicare HMO | Admitting: Internal Medicine

## 2015-03-23 ENCOUNTER — Encounter: Payer: Self-pay | Admitting: Internal Medicine

## 2015-03-23 VITALS — BP 112/70 | HR 78 | Temp 97.5°F | Wt 179.0 lb

## 2015-03-23 DIAGNOSIS — R252 Cramp and spasm: Secondary | ICD-10-CM | POA: Insufficient documentation

## 2015-03-23 DIAGNOSIS — M2550 Pain in unspecified joint: Secondary | ICD-10-CM | POA: Diagnosis not present

## 2015-03-23 DIAGNOSIS — F39 Unspecified mood [affective] disorder: Secondary | ICD-10-CM | POA: Insufficient documentation

## 2015-03-23 DIAGNOSIS — F4323 Adjustment disorder with mixed anxiety and depressed mood: Secondary | ICD-10-CM

## 2015-03-23 DIAGNOSIS — I10 Essential (primary) hypertension: Secondary | ICD-10-CM

## 2015-03-23 DIAGNOSIS — G4700 Insomnia, unspecified: Secondary | ICD-10-CM

## 2015-03-23 LAB — CBC WITH DIFFERENTIAL/PLATELET
Basophils Absolute: 0 10*3/uL (ref 0.0–0.1)
Basophils Relative: 0.6 % (ref 0.0–3.0)
Eosinophils Absolute: 0.2 10*3/uL (ref 0.0–0.7)
Eosinophils Relative: 2.8 % (ref 0.0–5.0)
HCT: 45.1 % (ref 36.0–46.0)
Hemoglobin: 15.1 g/dL — ABNORMAL HIGH (ref 12.0–15.0)
LYMPHS ABS: 2.1 10*3/uL (ref 0.7–4.0)
Lymphocytes Relative: 35.8 % (ref 12.0–46.0)
MCHC: 33.3 g/dL (ref 30.0–36.0)
MCV: 93.1 fl (ref 78.0–100.0)
MONO ABS: 0.5 10*3/uL (ref 0.1–1.0)
Monocytes Relative: 8.1 % (ref 3.0–12.0)
Neutro Abs: 3.1 10*3/uL (ref 1.4–7.7)
Neutrophils Relative %: 52.7 % (ref 43.0–77.0)
PLATELETS: 249 10*3/uL (ref 150.0–400.0)
RBC: 4.84 Mil/uL (ref 3.87–5.11)
RDW: 13.1 % (ref 11.5–15.5)
WBC: 5.9 10*3/uL (ref 4.0–10.5)

## 2015-03-23 LAB — HEMOGLOBIN A1C: Hgb A1c MFr Bld: 5.5 % (ref 4.6–6.5)

## 2015-03-23 LAB — COMPREHENSIVE METABOLIC PANEL
ALK PHOS: 71 U/L (ref 39–117)
ALT: 19 U/L (ref 0–35)
AST: 19 U/L (ref 0–37)
Albumin: 4.1 g/dL (ref 3.5–5.2)
BILIRUBIN TOTAL: 0.7 mg/dL (ref 0.2–1.2)
BUN: 16 mg/dL (ref 6–23)
CALCIUM: 9.6 mg/dL (ref 8.4–10.5)
CO2: 28 mEq/L (ref 19–32)
Chloride: 105 mEq/L (ref 96–112)
Creatinine, Ser: 1.08 mg/dL (ref 0.40–1.20)
GFR: 53.31 mL/min — ABNORMAL LOW (ref >60.00)
Glucose, Bld: 102 mg/dL — ABNORMAL HIGH (ref 70–99)
POTASSIUM: 4.1 meq/L (ref 3.5–5.1)
Sodium: 141 mEq/L (ref 135–145)
Total Protein: 7.2 g/dL (ref 6.0–8.3)

## 2015-03-23 LAB — TSH: TSH: 1.37 u[IU]/mL (ref 0.35–4.50)

## 2015-03-23 LAB — VITAMIN B12: VITAMIN B 12: 787 pg/mL (ref 211–911)

## 2015-03-23 MED ORDER — LISINOPRIL 30 MG PO TABS: 30.0000 mg | ORAL_TABLET | Freq: Every day | ORAL | Status: DC

## 2015-03-23 MED ORDER — CLONAZEPAM 0.5 MG PO TABS: 0.5000 mg | ORAL_TABLET | Freq: Every day | ORAL | Status: DC

## 2015-03-23 MED ORDER — ETODOLAC 400 MG PO TABS: 400.0000 mg | ORAL_TABLET | Freq: Two times a day (BID) | ORAL | Status: DC

## 2015-03-23 NOTE — Assessment & Plan Note (Signed)
Several recent stressors. Offered support today. Will start Clonazepam at bedtime to help with anxiety and insomnia.

## 2015-03-23 NOTE — Assessment & Plan Note (Signed)
BP Readings from Last 3 Encounters:  03/23/15 112/70  11/07/14 153/73  07/28/14 120/60   BP has been on low side after increase in Lisinopril. Will decrease back to 30mg  daily. Follow up in 4 weeks.

## 2015-03-23 NOTE — Progress Notes (Signed)
Pre visit review using our clinic review tool, if applicable. No additional management support is needed unless otherwise documented below in the visit note. 

## 2015-03-23 NOTE — Patient Instructions (Addendum)
Start Clonazepam 0.5 to 1mg  at bedtime to help with anxiety and leg cramps.  This medication will make you drowsy.  Taper dose of Lisinopril to 30mg  daily.  Labs today.  Follow up in 4 weeks.

## 2015-03-23 NOTE — Progress Notes (Signed)
Subjective:    Patient ID: Monica Larson, female    DOB: 10/10/1945, 69 y.o.   MRN: [id number]  HPI  69YO female presents for acute visit.  Leg cramps - Having leg cramps all day. Worse over last 2-3 days. Tried mustard, soda water, pickle juice with minimal improvement. No changes to medications. No recent illnessness. Can palpate spasm. Unable to walk during spasm.  Notes some increased anxiety. Having trouble sleeping. Best friend is dying.  BP Readings from Last 3 Encounters:  03/23/15 112/70  11/07/14 153/73  07/28/14 120/60     Past medical, surgical, family and social history per today's encounter.  Review of Systems  Constitutional: Positive for fatigue. Negative for fever, chills, appetite change and unexpected weight change.  Eyes: Negative for visual disturbance.  Respiratory: Negative for shortness of breath.   Cardiovascular: Negative for chest pain and leg swelling.  Gastrointestinal: Negative for abdominal pain.  Musculoskeletal: Positive for myalgias. Negative for arthralgias.  Skin: Negative for color change and rash.  Hematological: Negative for adenopathy. Does not bruise/bleed easily.  Psychiatric/Behavioral: Positive for sleep disturbance. Negative for dysphoric mood. The patient is nervous/anxious.        Objective:    BP 112/70 mmHg  Pulse 78  Temp(Src) 97.5 F (36.4 C) (Oral)  Wt 179 lb (81.194 kg)  SpO2 98% Physical Exam  Constitutional: She is oriented to person, place, and time. She appears well-developed and well-nourished. No distress.  HENT:  Head: Normocephalic and atraumatic.  Right Ear: External ear normal.  Left Ear: External ear normal.  Nose: Nose normal.  Mouth/Throat: Oropharynx is clear and moist. No oropharyngeal exudate.  Eyes: Conjunctivae are normal. Pupils are equal, round, and reactive to light. Right eye exhibits no discharge. Left eye exhibits no discharge. No scleral icterus.  Neck: Normal range of motion. Neck supple.  No tracheal deviation present. No thyromegaly present.  Cardiovascular: Normal rate, regular rhythm, normal heart sounds and intact distal pulses.  Exam reveals no gallop and no friction rub.   No murmur heard. Pulmonary/Chest: Effort normal and breath sounds normal. No respiratory distress. She has no wheezes. She has no rales. She exhibits no tenderness.  Musculoskeletal: Normal range of motion. She exhibits no edema or tenderness.  Lymphadenopathy:    She has no cervical adenopathy.  Neurological: She is alert and oriented to person, place, and time. No cranial nerve deficit. She exhibits normal muscle tone. Coordination normal.  Skin: Skin is warm and dry. No rash noted. She is not diaphoretic. No erythema. No pallor.  Psychiatric: Her speech is normal and behavior is normal. Judgment and thought content normal. Her mood appears anxious. Cognition and memory are normal.          Assessment & Plan:   Problem List Items Addressed This Visit      Unprioritized   Adjustment disorder with mixed anxiety and depressed mood    Several recent stressors. Offered support today. Will start Clonazepam at bedtime to help with anxiety and insomnia.      Arthralgia   Relevant Medications   etodolac (LODINE) 400 MG tablet   Hypertension    BP Readings from Last 3 Encounters:  03/23/15 112/70  11/07/14 153/73  07/28/14 120/60   BP has been on low side after increase in Lisinopril. Will decrease back to 30mg  daily. Follow up in 4 weeks.      Relevant Medications   NIACIN, ANTIHYPERLIPIDEMIC, PO   lisinopril (PRINIVIL,ZESTRIL) 30 MG tablet   Insomnia  Recent insomnia related to leg cramps. Will add Clonazepam prn at bedtime. Follow up in 4 weeks and prn      Leg cramps - Primary    Recent worsening of leg cramps. Will check CMP, CBC, TSH, B12, urine heavy metals. Exam normal. Will add prn Clonazepam at bedtime. Follow up in 4 weeks.      Relevant Medications   clonazePAM (KLONOPIN)  0.5 MG tablet   Other Relevant Orders   Comprehensive metabolic panel   Hemoglobin A1c   CBC with Differential/Platelet   TSH   Heavy metals screen, urine   B12       Return in about 4 weeks (around 04/20/2015) for Recheck.

## 2015-03-23 NOTE — Assessment & Plan Note (Signed)
Recent insomnia related to leg cramps. Will add Clonazepam prn at bedtime. Follow up in 4 weeks and prn

## 2015-03-23 NOTE — Assessment & Plan Note (Signed)
>>  ASSESSMENT AND PLAN FOR ADJUSTMENT DISORDER WITH MIXED ANXIETY AND DEPRESSED MOOD WRITTEN ON 03/23/2015  9:48 AM BY Otho Blitz, JENNIFER A, MD  Several recent stressors. Offered support today. Will start Clonazepam  at bedtime to help with anxiety and insomnia.

## 2015-03-23 NOTE — Assessment & Plan Note (Signed)
Recent worsening of leg cramps. Will check CMP, CBC, TSH, B12, urine heavy metals. Exam normal. Will add prn Clonazepam at bedtime. Follow up in 4 weeks.

## 2015-03-28 ENCOUNTER — Other Ambulatory Visit: Payer: Self-pay | Admitting: Internal Medicine

## 2015-03-29 ENCOUNTER — Telehealth: Payer: Self-pay | Admitting: *Deleted

## 2015-03-29 DIAGNOSIS — R252 Cramp and spasm: Secondary | ICD-10-CM | POA: Diagnosis not present

## 2015-03-29 NOTE — Telephone Encounter (Signed)
Pt came in dropped off 24 hour urine, pt asked about 03-23-2015 lab results pt was notified

## 2015-04-03 LAB — HEAVY METALS SCREEN, URINE
Arsenic, 24H Ur: 4 mcg/L (ref <81)
Lead, Urine (24 Hr): <1 mcg/L (ref <80)
Mercury 24 Hr Urine: <2 mcg/L (ref <21)

## 2015-04-10 ENCOUNTER — Encounter: Payer: Self-pay | Admitting: Internal Medicine

## 2015-04-10 ENCOUNTER — Ambulatory Visit (INDEPENDENT_AMBULATORY_CARE_PROVIDER_SITE_OTHER): Payer: Medicare HMO | Admitting: Internal Medicine

## 2015-04-10 VITALS — BP 120/52 | HR 60 | Temp 98.2°F | Wt 182.8 lb

## 2015-04-10 DIAGNOSIS — R399 Unspecified symptoms and signs involving the genitourinary system: Secondary | ICD-10-CM | POA: Diagnosis not present

## 2015-04-10 DIAGNOSIS — Z780 Asymptomatic menopausal state: Secondary | ICD-10-CM | POA: Diagnosis not present

## 2015-04-10 DIAGNOSIS — I10 Essential (primary) hypertension: Secondary | ICD-10-CM | POA: Diagnosis not present

## 2015-04-10 DIAGNOSIS — R3989 Other symptoms and signs involving the genitourinary system: Secondary | ICD-10-CM | POA: Diagnosis not present

## 2015-04-10 DIAGNOSIS — N3000 Acute cystitis without hematuria: Secondary | ICD-10-CM | POA: Diagnosis not present

## 2015-04-10 DIAGNOSIS — N390 Urinary tract infection, site not specified: Secondary | ICD-10-CM | POA: Insufficient documentation

## 2015-04-10 LAB — POCT URINALYSIS DIPSTICK
Bilirubin, UA: NEGATIVE
Glucose, UA: NEGATIVE
Ketones, UA: NEGATIVE
Nitrite, UA: POSITIVE
PH UA: 5.5
Protein, UA: NEGATIVE
Spec Grav, UA: 1.025
Urobilinogen, UA: 1

## 2015-04-10 MED ORDER — CIPROFLOXACIN HCL 500 MG PO TABS: 500.0000 mg | ORAL_TABLET | Freq: Two times a day (BID) | ORAL | Status: DC

## 2015-04-10 NOTE — Patient Instructions (Addendum)
Start Cipro twice daily for urinary tract infection.  Increase fluid intake and use Azo as needed for pain.  Follow up if symptoms are not improving.  We will send your urine for culture.

## 2015-04-10 NOTE — Assessment & Plan Note (Signed)
Encouraged her to use Premarin as encouraged by GYN. Will schedule bone density testing.

## 2015-04-10 NOTE — Assessment & Plan Note (Signed)
Symptoms and UA c/w UTI. Will start Cipro. Send urine for culture. Continue increased fluid intake and prn Azo. Follow up prn.

## 2015-04-10 NOTE — Assessment & Plan Note (Signed)
BP Readings from Last 3 Encounters:  04/10/15 120/52  03/23/15 112/70  11/07/14 153/73   BP well controlled. Continue Lisinopril.

## 2015-04-10 NOTE — Progress Notes (Signed)
   Subjective:    Patient ID: Monica Larson, female    DOB: 1945/10/30, 69 y.o.   MRN: [id number]  HPI  69YO female presents for acute visit.   Feeling more tired and occasionally some dizziness recently. Attributed this to Clonazepam, so stopped medication. Symptoms improved.  HTN - BP has been well controlled [id number] systolic. Compliant with medications.  UTI - Having some lower pelvic pain, burning with urination. Less urine flow, despite urgency. Started this weekend. Took an antibiotic she had a home, Cipro she thinks. Started Azo and cranberry with some improvement. No fever, chills. No hematuria noted.  Past medical, surgical, family and social history per today's encounter.  Review of Systems  Constitutional: Negative for fever and chills.  Gastrointestinal: Negative for nausea, vomiting, abdominal pain, diarrhea, constipation and rectal pain.  Genitourinary: Positive for dysuria, urgency, frequency and difficulty urinating. Negative for hematuria, flank pain, decreased urine volume, vaginal bleeding, vaginal discharge, vaginal pain and pelvic pain.  Neurological: Positive for dizziness. Negative for tremors, seizures, weakness, light-headedness, numbness and headaches.       Objective:    BP 120/52 mmHg  Pulse 60  Temp(Src) 98.2 F (36.8 C) (Oral)  Wt 182 lb 12.8 oz (82.918 kg)  SpO2 98% Physical Exam  Constitutional: She is oriented to person, place, and time. She appears well-developed and well-nourished. No distress.  HENT:  Head: Normocephalic and atraumatic.  Right Ear: External ear normal.  Left Ear: External ear normal.  Nose: Nose normal.  Mouth/Throat: Oropharynx is clear and moist. No oropharyngeal exudate.  Eyes: Conjunctivae are normal. Pupils are equal, round, and reactive to light. Right eye exhibits no discharge. Left eye exhibits no discharge. No scleral icterus.  Neck: Normal range of motion. Neck supple. No tracheal deviation present. No thyromegaly  present.  Cardiovascular: Normal rate, regular rhythm, normal heart sounds and intact distal pulses.  Exam reveals no gallop and no friction rub.   No murmur heard. Pulmonary/Chest: Effort normal and breath sounds normal. No respiratory distress. She has no wheezes. She has no rales. She exhibits no tenderness.  Abdominal: There is no tenderness (no CVA tenderness).  Musculoskeletal: Normal range of motion. She exhibits no edema or tenderness.  Lymphadenopathy:    She has no cervical adenopathy.  Neurological: She is alert and oriented to person, place, and time. No cranial nerve deficit. She exhibits normal muscle tone. Coordination normal.  Skin: Skin is warm and dry. No rash noted. She is not diaphoretic. No erythema. No pallor.  Psychiatric: She has a normal mood and affect. Her behavior is normal. Judgment and thought content normal.          Assessment & Plan:   Problem List Items Addressed This Visit      Unprioritized   Hypertension    BP Readings from Last 3 Encounters:  04/10/15 120/52  03/23/15 112/70  11/07/14 153/73   BP well controlled. Continue Lisinopril.      Postmenopausal estrogen deficiency    Encouraged her to use Premarin as encouraged by GYN. Will schedule bone density testing.      Relevant Orders   DG Bone Density   Urinary tract infection - Primary    Symptoms and UA c/w UTI. Will start Cipro. Send urine for culture. Continue increased fluid intake and prn Azo. Follow up prn.          Return if symptoms worsen or fail to improve.

## 2015-04-10 NOTE — Progress Notes (Signed)
Pre visit review using our clinic review tool, if applicable. No additional management support is needed unless otherwise documented below in the visit note. 

## 2015-04-12 LAB — URINE CULTURE

## 2015-04-21 ENCOUNTER — Ambulatory Visit: Payer: Medicare HMO | Admitting: Internal Medicine

## 2015-04-26 ENCOUNTER — Ambulatory Visit: Payer: Medicare HMO | Admitting: Internal Medicine

## 2015-04-27 ENCOUNTER — Ambulatory Visit: Payer: Commercial Managed Care - HMO | Attending: Internal Medicine

## 2015-05-01 ENCOUNTER — Other Ambulatory Visit: Payer: Self-pay | Admitting: Internal Medicine

## 2015-05-08 DIAGNOSIS — L298 Other pruritus: Secondary | ICD-10-CM | POA: Diagnosis not present

## 2015-05-08 DIAGNOSIS — D2239 Melanocytic nevi of other parts of face: Secondary | ICD-10-CM | POA: Diagnosis not present

## 2015-05-08 DIAGNOSIS — Z85828 Personal history of other malignant neoplasm of skin: Secondary | ICD-10-CM | POA: Diagnosis not present

## 2015-05-08 DIAGNOSIS — L538 Other specified erythematous conditions: Secondary | ICD-10-CM | POA: Diagnosis not present

## 2015-05-08 DIAGNOSIS — L820 Inflamed seborrheic keratosis: Secondary | ICD-10-CM | POA: Diagnosis not present

## 2015-05-08 DIAGNOSIS — D2262 Melanocytic nevi of left upper limb, including shoulder: Secondary | ICD-10-CM | POA: Diagnosis not present

## 2015-05-08 DIAGNOSIS — D225 Melanocytic nevi of trunk: Secondary | ICD-10-CM | POA: Diagnosis not present

## 2015-05-11 ENCOUNTER — Ambulatory Visit: Payer: Medicare HMO | Admitting: Internal Medicine

## 2015-05-18 ENCOUNTER — Ambulatory Visit
Admission: RE | Admit: 2015-05-18 | Discharge: 2015-05-18 | Disposition: A | Discharge status: Home / Self Care | Payer: Commercial Managed Care - HMO | Source: Ambulatory Visit | Attending: Internal Medicine | Admitting: Internal Medicine

## 2015-05-18 DIAGNOSIS — Z780 Asymptomatic menopausal state: Secondary | ICD-10-CM | POA: Insufficient documentation

## 2015-05-18 DIAGNOSIS — M810 Age-related osteoporosis without current pathological fracture: Secondary | ICD-10-CM | POA: Diagnosis not present

## 2015-07-10 ENCOUNTER — Ambulatory Visit (INDEPENDENT_AMBULATORY_CARE_PROVIDER_SITE_OTHER): Payer: Commercial Managed Care - HMO | Admitting: Nurse Practitioner

## 2015-07-10 ENCOUNTER — Other Ambulatory Visit: Payer: Self-pay | Admitting: Internal Medicine

## 2015-07-10 ENCOUNTER — Encounter: Payer: Self-pay | Admitting: Nurse Practitioner

## 2015-07-10 VITALS — BP 118/58 | HR 73 | Temp 98.6°F | Resp 12 | Ht 66.25 in | Wt 182.8 lb

## 2015-07-10 DIAGNOSIS — N3000 Acute cystitis without hematuria: Secondary | ICD-10-CM

## 2015-07-10 DIAGNOSIS — R300 Dysuria: Secondary | ICD-10-CM

## 2015-07-10 LAB — POCT URINALYSIS DIPSTICK
GLUCOSE UA: 250
Ketones, UA: 15
Nitrite, UA: POSITIVE
PROTEIN UA: 100
SPEC GRAV UA: 1.015
UROBILINOGEN UA: 4
pH, UA: 5

## 2015-07-10 MED ORDER — CIPROFLOXACIN HCL 500 MG PO TABS: 500.0000 mg | ORAL_TABLET | Freq: Two times a day (BID) | ORAL | Status: DC

## 2015-07-10 NOTE — Progress Notes (Signed)
Patient ID: Monica Larson, female    DOB: 1945-09-18  Age: 69 y.o. MRN: [id number]  CC: Urinary Tract Infection   HPI LASHIA NIESE presents for UTI symptoms x 7-8 months.   1) UTI symptoms of dysuria, frequency, urgency, pressure. Patient reports this has been ongoing her last treatment was in August. She reports she has a bladder sling that needs revising. Patient is taking [place] has a past medical history of HTN (hypertension); Asthma; HLD (hyperlipidemia); and GERD (gastroesophageal reflux disease).   She has past surgical history that includes Eye surgery; Cholecystectomy; Vesicovaginal fistula closure w/ TAH; Carpal tunnel release; and Skin cancer excision (Right, Wrist and thigh (x2)).   Her family history includes Coronary artery disease in her father; Diabetes in her father; Heart attack in her brother; Ovarian cancer in her mother; Prostate cancer in her father.She reports that she has never smoked. She has never used smokeless tobacco. She reports that she drinks alcohol. She reports that she does not use illicit drugs.  Outpatient Prescriptions Prior to Visit  Medication Sig Dispense Refill  . aspirin (ASPIR-LOW) 81 MG EC tablet Take 81 mg by mouth daily.      . DiphenhydrAMINE HCl (BENADRYL ALLERGY PO) Take 1 tablet by mouth daily as needed.      Marland Kitchen lisinopril (PRINIVIL,ZESTRIL) 30 MG tablet Take 1 tablet (30 mg total) by mouth daily. 90 tablet 3  . Multiple Vitamins-Minerals (MULTIVITAMIN WITH MINERALS) tablet Take 1 tablet by mouth daily.    Marland Kitchen NIACIN, ANTIHYPERLIPIDEMIC, PO Take by mouth.    . Omega-3 Fatty Acids (FISH OIL) 500 MG CAPS Take 1,000 mg by mouth daily.    . TOVIAZ 8 MG TB24 tablet TAKE 1 TABLET (8 MG TOTAL) BY MOUTH DAILY. 30 tablet 5  . traZODone (DESYREL) 100 MG tablet Take 50 mg by mouth at bedtime as needed.    . vitamin B-12 (CYANOCOBALAMIN) 1000 MCG tablet Take 1,000 mcg by mouth daily.    . ciprofloxacin (CIPRO) 500 MG tablet Take 1 tablet (500 mg  total) by mouth 2 (two) times daily. 14 tablet 0  . TOVIAZ 8 MG TB24 tablet TAKE 1 TABLET (8 MG TOTAL) BY MOUTH DAILY. 30 tablet 5   No facility-administered medications prior to visit.    ROS Review of Systems  Constitutional: Negative for fever, chills, diaphoresis and fatigue.  Gastrointestinal: Negative for abdominal pain and abdominal distention.  Genitourinary: Positive for dysuria, urgency and frequency. Negative for hematuria, flank pain, decreased urine volume, vaginal discharge, difficulty urinating, vaginal pain and pelvic pain.  Skin: Negative for rash.    Objective:  BP 118/58 mmHg  Pulse 73  Temp(Src) 98.6 F (37 C)  Resp 12  Ht 5' 6.25" (1.683 m)  Wt 182 lb 12.8 oz (82.918 kg)  BMI 29.27 kg/m2  SpO2 95%  Physical Exam  Constitutional: She is oriented to person, place, and time. She appears well-developed and well-nourished. No distress.  HENT:  Head: Normocephalic and atraumatic.  Right Ear: External ear normal.  Left Ear: External ear normal.  Abdominal: There is no CVA tenderness.  Neurological: She is alert and oriented to person, place, and time.  Skin: Skin is warm and dry. No rash noted. She is not diaphoretic.  Psychiatric: She has a normal mood and affect. Her behavior is normal. Judgment and thought content normal.   Assessment & Plan:   Diella was seen today for urinary tract infection.  Diagnoses and all orders for this visit:  Dysuria -     POCT Urinalysis Dipstick -     Urine culture  Acute cystitis without hematuria  Other orders -     ciprofloxacin (CIPRO) 500 MG tablet; Take 1 tablet (500 mg total) by mouth 2 (two) times daily.   I am having Ms. Muzquiz maintain her aspirin, DiphenhydrAMINE HCl (BENADRYL ALLERGY PO), vitamin B-12, traZODone, Fish Oil, multivitamin with minerals, (NIACIN, ANTIHYPERLIPIDEMIC, PO), lisinopril, TOVIAZ, and ciprofloxacin.  Meds ordered this encounter  Medications  . ciprofloxacin (CIPRO) 500 MG tablet     Sig: Take 1 tablet (500 mg total) by mouth 2 (two) times daily.    Dispense:  14 tablet    Refill:  0    Order Specific Question:  Supervising Provider    Answer:  Crecencio Mc [[id number]]     Follow-up: Return if symptoms worsen or fail to improve.

## 2015-07-10 NOTE — Patient Instructions (Signed)
Please take a probiotic ( Align, [place] or Culturelle) while you are on the antibiotic to prevent a serious antibiotic associated diarrhea  Called clostirudium dificile colitis and a vaginal yeast infection.   Cipro as directed. We will send culture results via MyChart.   Lots of water!

## 2015-07-10 NOTE — Assessment & Plan Note (Signed)
POCT urine probable for infection. We will get urine culture today. Start Cipro 500 mg twice a day x 7 days. Encouraged probiotics and water.

## 2015-07-10 NOTE — Progress Notes (Signed)
Pre visit review using our clinic review tool, if applicable. No additional management support is needed unless otherwise documented below in the visit note. 

## 2015-07-12 LAB — URINE CULTURE: Colony Count: >=100000

## 2015-07-26 ENCOUNTER — Ambulatory Visit: Payer: Commercial Managed Care - HMO | Admitting: *Deleted

## 2015-07-27 ENCOUNTER — Ambulatory Visit: Payer: Commercial Managed Care - HMO

## 2015-08-31 ENCOUNTER — Ambulatory Visit (INDEPENDENT_AMBULATORY_CARE_PROVIDER_SITE_OTHER): Payer: Commercial Managed Care - HMO | Admitting: Internal Medicine

## 2015-08-31 ENCOUNTER — Encounter: Payer: Self-pay | Admitting: Internal Medicine

## 2015-08-31 VITALS — BP 120/73 | HR 72 | Temp 98.0°F | Ht 66.25 in | Wt 184.0 lb

## 2015-08-31 DIAGNOSIS — I10 Essential (primary) hypertension: Secondary | ICD-10-CM

## 2015-08-31 DIAGNOSIS — R32 Unspecified urinary incontinence: Secondary | ICD-10-CM

## 2015-08-31 DIAGNOSIS — F4323 Adjustment disorder with mixed anxiety and depressed mood: Secondary | ICD-10-CM | POA: Diagnosis not present

## 2015-08-31 DIAGNOSIS — G4700 Insomnia, unspecified: Secondary | ICD-10-CM

## 2015-08-31 LAB — COMPREHENSIVE METABOLIC PANEL
ALT: 15 U/L (ref 0–35)
AST: 14 U/L (ref 0–37)
Albumin: 3.9 g/dL (ref 3.5–5.2)
Alkaline Phosphatase: 60 U/L (ref 39–117)
BUN: 20 mg/dL (ref 6–23)
CHLORIDE: 108 meq/L (ref 96–112)
CO2: 27 mEq/L (ref 19–32)
Calcium: 9.2 mg/dL (ref 8.4–10.5)
Creatinine, Ser: 0.83 mg/dL (ref 0.40–1.20)
GFR: 72.14 mL/min (ref >60.00)
GLUCOSE: 134 mg/dL — AB (ref 70–99)
POTASSIUM: 4.3 meq/L (ref 3.5–5.1)
SODIUM: 141 meq/L (ref 135–145)
Total Bilirubin: 0.4 mg/dL (ref 0.2–1.2)
Total Protein: 6.8 g/dL (ref 6.0–8.3)

## 2015-08-31 LAB — LIPID PANEL
Cholesterol: 233 mg/dL — ABNORMAL HIGH (ref 0–200)
HDL: 51.9 mg/dL (ref >39.00)
LDL CALC: 161 mg/dL — AB (ref 0–99)
NONHDL: 181.33
Total CHOL/HDL Ratio: 4
Triglycerides: 102 mg/dL (ref 0.0–149.0)
VLDL: 20.4 mg/dL (ref 0.0–40.0)

## 2015-08-31 MED ORDER — OXYBUTYNIN CHLORIDE ER 10 MG PO TB24: 10.0000 mg | ORAL_TABLET | Freq: Every day | ORAL | Status: DC

## 2015-08-31 MED ORDER — TRAZODONE HCL 150 MG PO TABS
150.0000 mg | ORAL_TABLET | Freq: Every day | ORAL | Status: DC
Start: 2015-08-31 — End: 2017-10-03

## 2015-08-31 NOTE — Assessment & Plan Note (Signed)
Symptoms poorly controlled on current dose of Trazodone. Will increase to 150mg  daily. Follow up prn if symptoms not improving.

## 2015-08-31 NOTE — Progress Notes (Signed)
Subjective:    Patient ID: Monica Larson, female    DOB: 22-Jun-1946, 69 y.o.   MRN: [id number]  HPI  69YO female presents for follow up.  HTN - BP well controlled. Compliant with medication.  Insomnia - Having trouble sleeping. Taking Trazodone 100mg  daily with minimal improvement.  Urinary incontinence - Unable to afford Toviaz. Would like to try back on Ditropan as this worked in the past. Has been followed by urogynecology. They have recommended repeat surgery.  Difficult time for her. Her grandson is mentally ill. He is living on the street. She has tried to help him, to no avail.   Wt Readings from Last 3 Encounters:  08/31/15 184 lb (83.462 kg)  07/10/15 182 lb 12.8 oz (82.918 kg)  04/10/15 182 lb 12.8 oz (82.918 kg)   BP Readings from Last 3 Encounters:  08/31/15 120/73  07/10/15 118/58  04/10/15 120/52    Past Medical History  Diagnosis Date  . HTN (hypertension)   . Asthma   . HLD (hyperlipidemia)     borderline  . GERD (gastroesophageal reflux disease)    Family History  Problem Relation Age of Onset  . Ovarian cancer Mother   . Prostate cancer Father   . Coronary artery disease Father   . Diabetes Father   . Heart attack Brother    Past Surgical History  Procedure Laterality Date  . Eye surgery      muscular as child  . Cholecystectomy    . Vesicovaginal fistula closure w/ tah    . Carpal tunnel release      right wrist  . Skin cancer excision Right Wrist and thigh (x2)    Unsure of dates and Dermatologist   Social History   Social History  . Marital Status: Married    Spouse Name: N/A  . Number of Children: N/A  . Years of Education: N/A   Social History Main Topics  . Smoking status: Never Smoker   . Smokeless tobacco: Never Used     Comment: 1 cig every 3 months   . Alcohol Use: Yes     Comment: no abuse  . Drug Use: No  . Sexual Activity: Not Asked   Other Topics Concern  . 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     Review of Systems  Constitutional: Negative for fever, chills, appetite change, fatigue and unexpected weight change.  Eyes: Negative for visual disturbance.  Respiratory: Negative for shortness of breath.   Cardiovascular: Negative for chest pain and leg swelling.  Gastrointestinal: Negative for nausea, vomiting, abdominal pain, diarrhea and constipation.  Genitourinary: Positive for urgency. Negative for dysuria, frequency, flank pain, difficulty urinating and dyspareunia.  Musculoskeletal: Negative for myalgias and arthralgias.  Skin: Negative for color change and rash.  Neurological: Negative for weakness.  Hematological: Negative for adenopathy. Does not bruise/bleed easily.  Psychiatric/Behavioral: Positive for dysphoric mood. Negative for sleep disturbance. The patient is nervous/anxious.        Objective:    BP 120/73 mmHg  Pulse 72  Temp(Src) 98 F (36.7 C) (Oral)  Ht 5' 6.25" (1.683 m)  Wt 184 lb (83.462 kg)  BMI 29.47 kg/m2  SpO2 97% Physical Exam  Constitutional: She is oriented to person, place, and time. She appears well-developed and well-nourished. No distress.  HENT:  Head: Normocephalic and atraumatic.  Right Ear: External ear normal.  Left Ear: External ear normal.  Nose: Nose normal.  Mouth/Throat:  Oropharynx is clear and moist. No oropharyngeal exudate.  Eyes: Conjunctivae are normal. Pupils are equal, round, and reactive to light. Right eye exhibits no discharge. Left eye exhibits no discharge. No scleral icterus.  Neck: Normal range of motion. Neck supple. No tracheal deviation present. No thyromegaly present.  Cardiovascular: Normal rate, regular rhythm, normal heart sounds and intact distal pulses.  Exam reveals no gallop and no friction rub.   No murmur heard. Pulmonary/Chest: Effort normal and breath sounds normal. No respiratory distress. She has no wheezes. She has no rales. She exhibits no  tenderness.  Musculoskeletal: Normal range of motion. She exhibits no edema or tenderness.  Lymphadenopathy:    She has no cervical adenopathy.  Neurological: She is alert and oriented to person, place, and time. No cranial nerve deficit. She exhibits normal muscle tone. Coordination normal.  Skin: Skin is warm and dry. No rash noted. She is not diaphoretic. No erythema. No pallor.  Psychiatric: Her speech is normal and behavior is normal. Judgment and thought content normal. Her mood appears anxious. Cognition and memory are normal. She exhibits a depressed mood. She expresses no suicidal ideation.          Assessment & Plan:   Problem List Items Addressed This Visit      Unprioritized   Adjustment disorder with mixed anxiety and depressed mood    Symptoms of anxiety and depression related to issues with her grandson. Offered support today. Encouraged her to consider counseling. Follow up prn.      Hypertension - Primary    BP Readings from Last 3 Encounters:  08/31/15 120/73  07/10/15 118/58  04/10/15 120/52   BP well controlled. Renal function with labs today. Continue Lisinopril.      Relevant Orders   Comprehensive metabolic panel   Lipid panel   Insomnia    Symptoms poorly controlled on current dose of Trazodone. Will increase to 150mg  daily. Follow up prn if symptoms not improving.      Urinary incontinence    Doing well on Toviaz, however not able to afford this. Will change to Ditropan 10 mg daily. Follow up prn.      Relevant Medications   oxybutynin (DITROPAN-XL) 10 MG 24 hr tablet       Return in about 6 months (around 02/29/2016) for Recheck.

## 2015-08-31 NOTE — Assessment & Plan Note (Signed)
Symptoms of anxiety and depression related to issues with her grandson. Offered support today. Encouraged her to consider counseling. Follow up prn.

## 2015-08-31 NOTE — Progress Notes (Signed)
Pre visit review using our clinic review tool, if applicable. No additional management support is needed unless otherwise documented below in the visit note. 

## 2015-08-31 NOTE — Assessment & Plan Note (Signed)
>>  ASSESSMENT AND PLAN FOR ADJUSTMENT DISORDER WITH MIXED ANXIETY AND DEPRESSED MOOD WRITTEN ON 08/31/2015  7:51 AM BY WALKER, JENNIFER A, MD  Symptoms of anxiety and depression related to issues with her grandson. Offered support today. Encouraged her to consider counseling. Follow up prn.

## 2015-08-31 NOTE — Patient Instructions (Addendum)
Increase Trazodone to 150mg  daily.  Stop Toviaz.  Start Ditropan 10mg  daily to help with urinary incontinence.   Labs today.

## 2015-08-31 NOTE — Assessment & Plan Note (Signed)
BP Readings from Last 3 Encounters:  08/31/15 120/73  07/10/15 118/58  04/10/15 120/52   BP well controlled. Renal function with labs today. Continue Lisinopril.

## 2015-08-31 NOTE — Assessment & Plan Note (Signed)
Doing well on Toviaz, however not able to afford this. Will change to Ditropan 10 mg daily. Follow up prn.

## 2015-09-25 ENCOUNTER — Other Ambulatory Visit: Payer: Self-pay

## 2015-09-25 MED ORDER — OXYBUTYNIN CHLORIDE ER 10 MG PO TB24: 10.0000 mg | ORAL_TABLET | Freq: Every day | ORAL | Status: DC

## 2015-09-26 ENCOUNTER — Telehealth: Payer: Self-pay

## 2015-09-26 ENCOUNTER — Other Ambulatory Visit: Payer: Self-pay | Admitting: *Deleted

## 2015-09-26 MED ORDER — OXYBUTYNIN CHLORIDE 5 MG PO TABS: 10.0000 mg | ORAL_TABLET | Freq: Every day | ORAL | Status: DC

## 2015-09-26 NOTE — Telephone Encounter (Signed)
[employer] called about pt.s medication Oxybutynin (Ditropan-XL) 10mg  24hr tablet. The pharmacy is requesting the RX to be changed to and immediate release. Please advise/tvw

## 2015-09-26 NOTE — Telephone Encounter (Signed)
That is fine 

## 2015-09-26 NOTE — Telephone Encounter (Signed)
Rx sent to pharmacy   

## 2015-09-28 ENCOUNTER — Other Ambulatory Visit: Payer: Self-pay

## 2015-09-28 MED ORDER — OXYBUTYNIN CHLORIDE 5 MG PO TABS: 10.0000 mg | ORAL_TABLET | Freq: Every day | ORAL | Status: DC

## 2015-10-15 DIAGNOSIS — N390 Urinary tract infection, site not specified: Secondary | ICD-10-CM | POA: Diagnosis not present

## 2015-11-15 DIAGNOSIS — D225 Melanocytic nevi of trunk: Secondary | ICD-10-CM | POA: Diagnosis not present

## 2015-11-15 DIAGNOSIS — D2262 Melanocytic nevi of left upper limb, including shoulder: Secondary | ICD-10-CM | POA: Diagnosis not present

## 2015-11-15 DIAGNOSIS — Z85828 Personal history of other malignant neoplasm of skin: Secondary | ICD-10-CM | POA: Diagnosis not present

## 2015-11-15 DIAGNOSIS — D2272 Melanocytic nevi of left lower limb, including hip: Secondary | ICD-10-CM | POA: Diagnosis not present

## 2015-11-20 DIAGNOSIS — H1859 Other hereditary corneal dystrophies: Secondary | ICD-10-CM | POA: Diagnosis not present

## 2016-01-10 ENCOUNTER — Ambulatory Visit (INDEPENDENT_AMBULATORY_CARE_PROVIDER_SITE_OTHER): Payer: Commercial Managed Care - HMO | Admitting: Internal Medicine

## 2016-01-10 ENCOUNTER — Encounter: Payer: Self-pay | Admitting: Internal Medicine

## 2016-01-10 VITALS — BP 130/74 | HR 60 | Ht 66.25 in | Wt 184.0 lb

## 2016-01-10 DIAGNOSIS — R197 Diarrhea, unspecified: Secondary | ICD-10-CM

## 2016-01-10 DIAGNOSIS — Z1159 Encounter for screening for other viral diseases: Secondary | ICD-10-CM | POA: Diagnosis not present

## 2016-01-10 LAB — COMPREHENSIVE METABOLIC PANEL
ALBUMIN: 4 g/dL (ref 3.5–5.2)
ALT: 14 U/L (ref 0–35)
AST: 17 U/L (ref 0–37)
Alkaline Phosphatase: 57 U/L (ref 39–117)
BUN: 11 mg/dL (ref 6–23)
CALCIUM: 9.3 mg/dL (ref 8.4–10.5)
CHLORIDE: 108 meq/L (ref 96–112)
CO2: 29 mEq/L (ref 19–32)
Creatinine, Ser: 0.85 mg/dL (ref 0.40–1.20)
GFR: 70.11 mL/min (ref >60.00)
Glucose, Bld: 122 mg/dL — ABNORMAL HIGH (ref 70–99)
POTASSIUM: 4.4 meq/L (ref 3.5–5.1)
Sodium: 141 mEq/L (ref 135–145)
Total Bilirubin: 0.5 mg/dL (ref 0.2–1.2)
Total Protein: 6.6 g/dL (ref 6.0–8.3)

## 2016-01-10 LAB — CBC WITH DIFFERENTIAL/PLATELET
Basophils Absolute: 0 10*3/uL (ref 0.0–0.1)
Basophils Relative: 0.8 % (ref 0.0–3.0)
EOS PCT: 5 % (ref 0.0–5.0)
Eosinophils Absolute: 0.2 10*3/uL (ref 0.0–0.7)
HCT: 40.9 % (ref 36.0–46.0)
HEMOGLOBIN: 13.8 g/dL (ref 12.0–15.0)
Lymphocytes Relative: 37.3 % (ref 12.0–46.0)
Lymphs Abs: 1.7 10*3/uL (ref 0.7–4.0)
MCHC: 33.6 g/dL (ref 30.0–36.0)
MCV: 92.7 fl (ref 78.0–100.0)
MONO ABS: 0.5 10*3/uL (ref 0.1–1.0)
MONOS PCT: 10 % (ref 3.0–12.0)
Neutro Abs: 2.2 10*3/uL (ref 1.4–7.7)
Neutrophils Relative %: 46.9 % (ref 43.0–77.0)
Platelets: 240 10*3/uL (ref 150.0–400.0)
RBC: 4.42 Mil/uL (ref 3.87–5.11)
RDW: 13.2 % (ref 11.5–15.5)
WBC: 4.6 10*3/uL (ref 4.0–10.5)

## 2016-01-10 LAB — LIPASE: LIPASE: 4 U/L — AB (ref 11.0–59.0)

## 2016-01-10 NOTE — Assessment & Plan Note (Addendum)
Acute on chronic diarrhea. Recent use of antibiotics for UTI. Will send stool testing for CDiff and culture. CBC, CMP normal today.  Lipase normal. Exam normal. We also discussed some other potential causes of diarrhea including microscopic colitis. Will set up GI evaluation for possible colonoscopy. Consider adding Lomotil if testing for CDiff negative. Discussed FODMAP diet and gave handout on this.

## 2016-01-10 NOTE — Progress Notes (Signed)
Subjective:    Patient ID: Monica Larson, female    DOB: 06-08-1946, 70 y.o.   MRN: [id number]  HPI  69YO female presents for acute visit.  Diarrhea - Watery diarrhea almost immediately after eating. No clear trigger. Minimal cramping. Some stool incontinence. Ongoing for last few months. Symptoms also worsened with increased stress. Tried imodium with minimal improvement. Some irritable bowel symptoms in the past, with cramping and loose stools at times, however this is much worse. Last colonoscopy 2015. Has been on antibiotics for UTI recently. No travel. No fever, chills.  Wt Readings from Last 3 Encounters:  01/10/16 184 lb (83.462 kg)  08/31/15 184 lb (83.462 kg)  07/10/15 182 lb 12.8 oz (82.918 kg)   BP Readings from Last 3 Encounters:  01/10/16 130/74  08/31/15 120/73  07/10/15 118/58    Past Medical History  Diagnosis Date  . HTN (hypertension)   . Asthma   . HLD (hyperlipidemia)     borderline  . GERD (gastroesophageal reflux disease)    Family History  Problem Relation Age of Onset  . Ovarian cancer Mother   . Prostate cancer Father   . Coronary artery disease Father   . Diabetes Father   . Heart attack Brother    Past Surgical History  Procedure Laterality Date  . Eye surgery      muscular as child  . Cholecystectomy    . Vesicovaginal fistula closure w/ tah    . Carpal tunnel release      right wrist  . Skin cancer excision Right Wrist and thigh (x2)    Unsure of dates and Dermatologist   Social History   Social History  . Marital Status: Married    Spouse Name: N/A  . Number of Children: N/A  . Years of Education: N/A   Social History Main Topics  . Smoking status: Never Smoker   . Smokeless tobacco: Never Used     Comment: 1 cig every 3 months   . Alcohol Use: Yes     Comment: no abuse  . Drug Use: No  . Sexual Activity: Not Asked   Other Topics Concern  . 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     Review of Systems  Constitutional: Negative for fever, chills, appetite change, fatigue and unexpected weight change.  Eyes: Negative for visual disturbance.  Respiratory: Negative for shortness of breath.   Cardiovascular: Negative for chest pain and leg swelling.  Gastrointestinal: Positive for diarrhea. Negative for nausea, vomiting, abdominal pain and constipation.  Skin: Negative for color change and rash.  Hematological: Negative for adenopathy. Does not bruise/bleed easily.  Psychiatric/Behavioral: Negative for sleep disturbance and dysphoric mood. The patient is not nervous/anxious.        Objective:    BP 130/74 mmHg  Pulse 60  Ht 5' 6.25" (1.683 m)  Wt 184 lb (83.462 kg)  BMI 29.47 kg/m2  SpO2 98% Physical Exam  Constitutional: She is oriented to person, place, and time. She appears well-developed and well-nourished. No distress.  HENT:  Head: Normocephalic and atraumatic.  Right Ear: External ear normal.  Left Ear: External ear normal.  Nose: Nose normal.  Mouth/Throat: Oropharynx is clear and moist. No oropharyngeal exudate.  Eyes: Conjunctivae and EOM are normal. Pupils are equal, round, and reactive to light. Right eye exhibits no discharge. Left eye exhibits no discharge. No scleral icterus.  Neck: Normal range of motion. Neck supple. No tracheal  deviation present. No thyromegaly present.  Cardiovascular: Normal rate, regular rhythm, normal heart sounds and intact distal pulses.  Exam reveals no gallop and no friction rub.   No murmur heard. Pulmonary/Chest: Effort normal and breath sounds normal. No respiratory distress. She has no wheezes. She has no rales. She exhibits no tenderness.  Abdominal: Soft. Bowel sounds are normal. She exhibits no distension and no mass. There is no tenderness. There is no rebound and no guarding.  Musculoskeletal: Normal range of motion. She exhibits no edema or tenderness.  Lymphadenopathy:    She has no  cervical adenopathy.  Neurological: She is alert and oriented to person, place, and time. No cranial nerve deficit. She exhibits normal muscle tone. Coordination normal.  Skin: Skin is warm and dry. No rash noted. She is not diaphoretic. No erythema. No pallor.  Psychiatric: She has a normal mood and affect. Her behavior is normal. Judgment and thought content normal.          Assessment & Plan:   Problem List Items Addressed This Visit      Unprioritized   Diarrhea - Primary    Acute on chronic diarrhea. Recent use of antibiotics for UTI. Will send stool testing for CDiff and culture. CBC, CMP normal today.  Lipase normal. Exam normal. We also discussed some other potential causes of diarrhea including microscopic colitis. Will set up GI evaluation for possible colonoscopy. Consider adding Lomotil if testing for CDiff negative.      Relevant Orders   Stool Culture   Stool C-Diff Toxin Assay   Ambulatory referral to Gastroenterology   Comprehensive metabolic panel (Completed)   Lipase (Completed)   CBC w/Diff (Completed)    Other Visit Diagnoses    Need for hepatitis C screening test        Relevant Orders    Hepatitis C antibody        Return in about 4 weeks (around 02/07/2016) for Recheck.  Ronette Deter, MD Internal Medicine [facility]

## 2016-01-10 NOTE — Patient Instructions (Addendum)
Labs today.  We will set up an evaluation with GI.  Follow up in 4 weeks.

## 2016-01-11 LAB — HEPATITIS C ANTIBODY: HCV AB: NEGATIVE

## 2016-01-12 DIAGNOSIS — R197 Diarrhea, unspecified: Secondary | ICD-10-CM | POA: Diagnosis not present

## 2016-01-18 DIAGNOSIS — K529 Noninfective gastroenteritis and colitis, unspecified: Secondary | ICD-10-CM | POA: Diagnosis not present

## 2016-01-30 NOTE — Discharge Instructions (Signed)

## 2016-01-31 ENCOUNTER — Ambulatory Visit
Admission: RE | Admit: 2016-01-31 | Discharge: 2016-01-31 | Disposition: A | Discharge status: Home / Self Care | Payer: Commercial Managed Care - HMO | Source: Ambulatory Visit | Attending: Gastroenterology | Admitting: Gastroenterology

## 2016-01-31 ENCOUNTER — Encounter
Admission: RE | Disposition: A | Discharge status: Home / Self Care | Payer: Self-pay | Source: Ambulatory Visit | Attending: Gastroenterology

## 2016-01-31 ENCOUNTER — Ambulatory Visit: Payer: Commercial Managed Care - HMO | Admitting: Anesthesiology

## 2016-01-31 DIAGNOSIS — M1990 Unspecified osteoarthritis, unspecified site: Secondary | ICD-10-CM | POA: Insufficient documentation

## 2016-01-31 DIAGNOSIS — K529 Noninfective gastroenteritis and colitis, unspecified: Secondary | ICD-10-CM | POA: Insufficient documentation

## 2016-01-31 DIAGNOSIS — K219 Gastro-esophageal reflux disease without esophagitis: Secondary | ICD-10-CM | POA: Insufficient documentation

## 2016-01-31 DIAGNOSIS — I10 Essential (primary) hypertension: Secondary | ICD-10-CM | POA: Diagnosis not present

## 2016-01-31 DIAGNOSIS — J45909 Unspecified asthma, uncomplicated: Secondary | ICD-10-CM | POA: Insufficient documentation

## 2016-01-31 DIAGNOSIS — R197 Diarrhea, unspecified: Secondary | ICD-10-CM | POA: Diagnosis not present

## 2016-01-31 HISTORY — DX: Headache, unspecified: R51.9

## 2016-01-31 HISTORY — DX: Motion sickness, initial encounter: T75.3XXA

## 2016-01-31 HISTORY — DX: Other fatigue: R53.83

## 2016-01-31 HISTORY — DX: Headache: R51

## 2016-01-31 HISTORY — DX: Reserved for inherently not codable concepts without codable children: IMO0001

## 2016-01-31 HISTORY — DX: Other seasonal allergic rhinitis: J30.2

## 2016-01-31 HISTORY — DX: Unspecified osteoarthritis, unspecified site: M19.90

## 2016-01-31 HISTORY — PX: COLONOSCOPY: SHX5424

## 2016-01-31 HISTORY — DX: Diarrhea, unspecified: R19.7

## 2016-01-31 LAB — HM COLONOSCOPY

## 2016-01-31 SURGERY — COLONOSCOPY
Anesthesia: Monitor Anesthesia Care | Wound class: Clean Contaminated

## 2016-01-31 MED ORDER — LACTATED RINGERS IV SOLN
INTRAVENOUS | Status: DC
  Administered 2016-01-31 (×2): via INTRAVENOUS

## 2016-01-31 MED ORDER — STERILE WATER FOR IRRIGATION IR SOLN
Status: DC | PRN
  Administered 2016-01-31: 300 mL

## 2016-01-31 MED ORDER — PROPOFOL 10 MG/ML IV BOLUS
INTRAVENOUS | Status: DC | PRN
  Administered 2016-01-31: 100 mg via INTRAVENOUS
  Administered 2016-01-31 (×2): 40 mg via INTRAVENOUS

## 2016-01-31 MED ORDER — LIDOCAINE HCL (CARDIAC) 20 MG/ML IV SOLN
INTRAVENOUS | Status: DC | PRN
  Administered 2016-01-31: 40 mg via INTRAVENOUS

## 2016-01-31 MED ORDER — SODIUM CHLORIDE 0.9 % IV SOLN: INTRAVENOUS | Status: DC

## 2016-01-31 SURGICAL SUPPLY — 30 items
CANISTER SUCT 1200ML W/VALVE (MISCELLANEOUS) ×3 IMPLANT
FCP ESCP3.2XJMB 240X2.8X (MISCELLANEOUS) ×1
FORCEPS BIOP RAD 4 LRG CAP 4 (CUTTING FORCEPS) IMPLANT
FORCEPS BIOP RJ4 240 W/NDL (MISCELLANEOUS) ×2
FORCEPS ESCP3.2XJMB 240X2.8X (MISCELLANEOUS) ×1 IMPLANT
GOWN CVR UNV OPN BCK APRN NK (MISCELLANEOUS) ×1 IMPLANT
GOWN ISOL THUMB LOOP REG UNIV (MISCELLANEOUS) ×2
GOWN STRL REUS W/ TWL LRG LVL3 (GOWN DISPOSABLE) ×1 IMPLANT
GOWN STRL REUS W/TWL LRG LVL3 (GOWN DISPOSABLE) ×2
HEMOCLIP INSTINCT (CLIP) IMPLANT
INJECTOR VARIJECT VIN23 (MISCELLANEOUS) IMPLANT
KIT CO2 TUBING (TUBING) IMPLANT
KIT DEFENDO VALVE AND CONN (KITS) IMPLANT
KIT ENDO PROCEDURE OLY (KITS) ×3 IMPLANT
LIGATOR MULTIBAND 6SHOOTER MBL (MISCELLANEOUS) IMPLANT
MARKER SPOT ENDO TATTOO 5ML (MISCELLANEOUS) IMPLANT
PAD GROUND ADULT SPLIT (MISCELLANEOUS) IMPLANT
SNARE SHORT THROW 13M SML OVAL (MISCELLANEOUS) IMPLANT
SNARE SHORT THROW 30M LRG OVAL (MISCELLANEOUS) IMPLANT
SPOT EX ENDOSCOPIC TATTOO (MISCELLANEOUS)
SUCTION POLY TRAP 4CHAMBER (MISCELLANEOUS) IMPLANT
TRAP SUCTION POLY (MISCELLANEOUS) IMPLANT
TUBING CONN 6MMX3.1M (TUBING)
TUBING SUCTION CONN 0.25 STRL (TUBING) IMPLANT
UNDERPAD 30X60 958B10 (PK) (MISCELLANEOUS) IMPLANT
VALVE BIOPSY ENDO (VALVE) IMPLANT
VARIJECT INJECTOR VIN23 (MISCELLANEOUS)
WATER AUXILLARY (MISCELLANEOUS) IMPLANT
WATER STERILE IRR 250ML POUR (IV SOLUTION) IMPLANT
WATER STERILE IRR 500ML POUR (IV SOLUTION) IMPLANT

## 2016-01-31 NOTE — Transfer of Care (Signed)
Immediate Anesthesia Transfer of Care Note  Patient: Monica Larson  Procedure(s) Performed: Procedure(s): COLONOSCOPY (N/A)  Patient Location: PACU  Anesthesia Type: MAC  Level of Consciousness: awake, alert  and patient cooperative  Airway and Oxygen Therapy: Patient Spontanous Breathing and Patient connected to supplemental oxygen  Post-op Assessment: Post-op Vital signs reviewed, Patient's Cardiovascular Status Stable, Respiratory Function Stable, Patent Airway and No signs of Nausea or vomiting  Post-op Vital Signs: Reviewed and stable  Complications: No apparent anesthesia complications

## 2016-01-31 NOTE — H&P (Signed)
  Date of Initial H&P:01/18/2016  History reviewed, patient examined, no change in status, stable for surgery.

## 2016-01-31 NOTE — Anesthesia Preprocedure Evaluation (Signed)
Anesthesia Evaluation  Patient identified by MRN, date of birth, ID band Patient awake    Reviewed: Allergy & Precautions, NPO status , Patient's Chart, lab work & pertinent test results  Airway Mallampati: II  TM Distance: >3 FB Neck ROM: Full    Dental no notable dental hx.    Pulmonary shortness of breath, asthma ,    Pulmonary exam normal breath sounds clear to auscultation       Cardiovascular hypertension, Normal cardiovascular exam Rhythm:Regular Rate:Normal     Neuro/Psych negative neurological ROS  negative psych ROS   GI/Hepatic Neg liver ROS, GERD  ,Chronic diarrhea   Endo/Other  negative endocrine ROS  Renal/GU negative Renal ROS  negative genitourinary   Musculoskeletal  (+) Arthritis ,   Abdominal   Peds negative pediatric ROS (+)  Hematology negative hematology ROS (+)   Anesthesia Other Findings   Reproductive/Obstetrics negative OB ROS                             Anesthesia Physical Anesthesia Plan  ASA: II  Anesthesia Plan: MAC   Post-op Pain Management:    Induction: Intravenous  Airway Management Planned:   Additional Equipment:   Intra-op Plan:   Post-operative Plan: Extubation in OR  Informed Consent: I have reviewed the patients History and Physical, chart, labs and discussed the procedure including the risks, benefits and alternatives for the proposed anesthesia with the patient or authorized representative who has indicated his/her understanding and acceptance.   Dental advisory given  Plan Discussed with: CRNA  Anesthesia Plan Comments:         Anesthesia Quick Evaluation

## 2016-01-31 NOTE — Anesthesia Procedure Notes (Signed)
Procedure Name: MAC Performed by: Lauran Romanski Pre-anesthesia Checklist: Patient identified, Emergency Drugs available, Suction available, Patient being monitored and Timeout performed Patient Re-evaluated:Patient Re-evaluated prior to inductionOxygen Delivery Method: Nasal cannula       

## 2016-01-31 NOTE — Op Note (Signed)
[facility] Gastroenterology Patient Name: Monica Larson Procedure Date: 01/31/2016 10:47 AM MRN: [id number] Account #: [id number] Date of Birth: 31-Mar-1946 Admit Type: Outpatient Age: 70 Room: [facility] OR ROOM 01 Gender: Female Note Status: Finalized Procedure:            Colonoscopy Indications:          Chronic diarrhea Providers:            Lupita Dawn. Candace Cruise, MD Referring MD:         Eduard Clos. Gilford Rile, MD (Referring MD) Medicines:            Monitored Anesthesia Care Complications:        No immediate complications. Procedure:            Pre-Anesthesia Assessment:                       - Prior to the procedure, a History and Physical was                        performed, and patient medications, allergies and                        sensitivities were reviewed. The patient's tolerance of                        previous anesthesia was reviewed.                       - The risks and benefits of the procedure and the                        sedation options and risks were discussed with the                        patient. All questions were answered and informed                        consent was obtained.                       - The risks and benefits of the procedure and the                        sedation options and risks were discussed with the                        patient. All questions were answered and informed                        consent was obtained.                       - After reviewing the risks and benefits, the patient                        was deemed in satisfactory condition to undergo the                        procedure.                       After  obtaining informed consent, the colonoscope was                        passed under direct vision. Throughout the procedure,                        the patient's blood pressure, pulse, and oxygen                        saturations were monitored continuously. The Olympus CF                        H180AL  colonoscope (S#: [id number]) was introduced through                        the anus and advanced to the the terminal ileum, with                        identification of the appendiceal orifice and IC valve.                        The colonoscopy was performed without difficulty. The                        patient tolerated the procedure well. The quality of                        the bowel preparation was good. Findings:      The terminal ileum appeared normal.      The colon (entire examined portion) appeared normal. Biopsies for       histology were taken with a cold forceps from the entire colon for       evaluation of microscopic colitis. Impression:           - The examined portion of the ileum was normal.                       - The entire examined colon is normal. Biopsied. Recommendation:       - Discharge patient to home.                       - Await pathology results.                       - The findings and recommendations were discussed with                        the patient's family. Procedure Code(s):    --- Professional ---                       [phone], Colonoscopy, flexible; with biopsy, single or                        multiple Diagnosis Code(s):    --- Professional ---                       K52.9, Noninfective gastroenteritis and colitis,                        unspecified CPT copyright 2016  American Medical Association. All rights reserved. The codes documented in this report are preliminary and upon coder review may  be revised to meet current compliance requirements. Hulen Luster, MD 01/31/2016 11:06:25 AM This report has been signed electronically. Number of Addenda: 0 Note Initiated On: 01/31/2016 10:47 AM Scope Withdrawal Time: 0 hours 3 minutes 56 seconds  Total Procedure Duration: 0 hours 5 minutes 33 seconds       [facility]

## 2016-01-31 NOTE — Anesthesia Postprocedure Evaluation (Signed)
Anesthesia Post Note  Patient: Monica Larson  Procedure(s) Performed: Procedure(s) (LRB): COLONOSCOPY (N/A)  Patient location during evaluation: PACU Anesthesia Type: MAC Level of consciousness: awake and alert Pain management: pain level controlled Vital Signs Assessment: post-procedure vital signs reviewed and stable Respiratory status: spontaneous breathing, nonlabored ventilation, respiratory function stable and patient connected to nasal cannula oxygen Cardiovascular status: stable and blood pressure returned to baseline Anesthetic complications: no    Britny Riel C

## 2016-02-01 ENCOUNTER — Encounter: Payer: Self-pay | Admitting: Gastroenterology

## 2016-02-02 LAB — SURGICAL PATHOLOGY

## 2016-02-12 ENCOUNTER — Encounter: Payer: Self-pay | Admitting: Internal Medicine

## 2016-02-12 ENCOUNTER — Ambulatory Visit (INDEPENDENT_AMBULATORY_CARE_PROVIDER_SITE_OTHER): Payer: Commercial Managed Care - HMO | Admitting: Internal Medicine

## 2016-02-12 VITALS — BP 164/88 | HR 69 | Ht 67.0 in | Wt 182.4 lb

## 2016-02-12 DIAGNOSIS — R197 Diarrhea, unspecified: Secondary | ICD-10-CM | POA: Diagnosis not present

## 2016-02-12 MED ORDER — OXYBUTYNIN CHLORIDE 5 MG PO TABS: 10.0000 mg | ORAL_TABLET | Freq: Every day | ORAL | Status: DC

## 2016-02-12 MED ORDER — LISINOPRIL 30 MG PO TABS: 30.0000 mg | ORAL_TABLET | Freq: Every day | ORAL | Status: DC

## 2016-02-12 NOTE — Patient Instructions (Signed)
Consider following a FODMAP diet.  Try adding Bean-O before a meal.  Follow up in 3 months and as needed.

## 2016-02-12 NOTE — Progress Notes (Signed)
Subjective:    Patient ID: Monica Larson, female    DOB: March 12, 1946, 70 y.o.   MRN: [id number]  HPI  69YO female presents for follow up.  Recently seen for diarrhea 01/10/2016. Colonoscopy was performed 5/24. Biopsy was negative for microscopic colitis. Stool cultures are still pending. Continues to have some diarrhea. Instructed by GI to follow a FODMAP diet.     Wt Readings from Last 3 Encounters:  02/12/16 182 lb 6.4 oz (82.736 kg)  01/31/16 180 lb (81.647 kg)  01/10/16 184 lb (83.462 kg)   BP Readings from Last 3 Encounters:  02/12/16 164/88  01/31/16 144/73  01/10/16 130/74    Past Medical History  Diagnosis Date  . HLD (hyperlipidemia)     borderline  . GERD (gastroesophageal reflux disease)   . Diarrhea     INTERMITTENT CHRONIC  . Seasonal allergies   . Fatigue   . Shortness of breath dyspnea     ON EXERTION  . HTN (hypertension)     CONTROLLED ON MEDS  . Asthma     SEASONAL  . Headache     SINUS/ ALLERGIES  . Arthritis     HANDS/NECK/SHOULDERS  . Motion sickness    Family History  Problem Relation Age of Onset  . Ovarian cancer Mother   . Prostate cancer Father   . Coronary artery disease Father   . Diabetes Father   . Heart attack Brother    Past Surgical History  Procedure Laterality Date  . Cholecystectomy    . Vesicovaginal fistula closure w/ tah    . Carpal tunnel release      right wrist  . Skin cancer excision Right Wrist and thigh (x2)    Unsure of dates and Dermatologist  . Eye surgery      muscular as child/ CATARACT BILATERAL  . Colonoscopy N/A 01/31/2016    Procedure: COLONOSCOPY;  Surgeon: Hulen Luster, MD;  Location: [place];  Service: Gastroenterology;  Laterality: N/A;   Social History   Social History  . Marital Status: Married    Spouse Name: N/A  . Number of Children: N/A  . Years of Education: N/A   Social History Main Topics  . Smoking status: Never Smoker   . Smokeless tobacco: Never Used     Comment:  1 cig every 3 months   . Alcohol Use: Yes     Comment: 1 MIXED DRINK/WEEK  . Drug Use: No  . Sexual Activity: Not Asked   Other Topics Concern  . 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     Review of Systems  Constitutional: Negative for fever, chills, appetite change, fatigue and unexpected weight change.  Eyes: Negative for visual disturbance.  Respiratory: Negative for shortness of breath.   Cardiovascular: Negative for chest pain and leg swelling.  Gastrointestinal: Positive for diarrhea. Negative for nausea, vomiting, abdominal pain and constipation.  Musculoskeletal: Negative for myalgias and arthralgias.  Skin: Negative for color change and rash.  Hematological: Negative for adenopathy. Does not bruise/bleed easily.  Psychiatric/Behavioral: Negative for sleep disturbance and dysphoric mood. The patient is not nervous/anxious.        Objective:    BP 164/88 mmHg  Pulse 69  Ht 5\' 7"  (1.702 m)  Wt 182 lb 6.4 oz (82.736 kg)  BMI 28.56 kg/m2  SpO2 97% Physical Exam  Constitutional: She is oriented to person, place, and time. She appears well-developed and well-nourished. No distress.  HENT:  Head: Normocephalic and atraumatic.  Right Ear: External ear normal.  Left Ear: External ear normal.  Nose: Nose normal.  Mouth/Throat: Oropharynx is clear and moist. No oropharyngeal exudate.  Eyes: Conjunctivae are normal. Pupils are equal, round, and reactive to light. Right eye exhibits no discharge. Left eye exhibits no discharge. No scleral icterus.  Neck: Normal range of motion. Neck supple. No tracheal deviation present. No thyromegaly present.  Cardiovascular: Normal rate, regular rhythm, normal heart sounds and intact distal pulses.  Exam reveals no gallop and no friction rub.   No murmur heard. Pulmonary/Chest: Effort normal and breath sounds normal. No respiratory distress. She has no wheezes. She has no rales. She exhibits no  tenderness.  Musculoskeletal: Normal range of motion. She exhibits no edema or tenderness.  Lymphadenopathy:    She has no cervical adenopathy.  Neurological: She is alert and oriented to person, place, and time. No cranial nerve deficit. She exhibits normal muscle tone. Coordination normal.  Skin: Skin is warm and dry. No rash noted. She is not diaphoretic. No erythema. No pallor.  Psychiatric: She has a normal mood and affect. Her behavior is normal. Judgment and thought content normal.          Assessment & Plan:   Problem List Items Addressed This Visit      Unprioritized   Diarrhea - Primary    Persistent symptoms of diarrhea. Mostly occurs after meals. Reviewed FODMAP diet and will add Bean-O before meals. Reviewed colonoscopy with pt and surgical path which was negative for microscopic colitis. Follow up in 3 months and prn.          Return in about 3 months (around 05/14/2016) for Recheck.  Ronette Deter, MD Internal Medicine [facility]

## 2016-02-12 NOTE — Assessment & Plan Note (Signed)
Persistent symptoms of diarrhea. Mostly occurs after meals. Reviewed FODMAP diet and will add Bean-O before meals. Reviewed colonoscopy with pt and surgical path which was negative for microscopic colitis. Follow up in 3 months and prn.

## 2016-02-27 ENCOUNTER — Telehealth: Payer: Self-pay | Admitting: Internal Medicine

## 2016-02-27 DIAGNOSIS — K529 Noninfective gastroenteritis and colitis, unspecified: Secondary | ICD-10-CM | POA: Diagnosis not present

## 2016-02-27 DIAGNOSIS — K219 Gastro-esophageal reflux disease without esophagitis: Secondary | ICD-10-CM | POA: Diagnosis not present

## 2016-02-27 DIAGNOSIS — R0789 Other chest pain: Secondary | ICD-10-CM | POA: Diagnosis not present

## 2016-02-27 NOTE — Telephone Encounter (Signed)
Patient was informed that she needed to be seen.

## 2016-02-27 NOTE — Telephone Encounter (Signed)
No. Needs to be seen. 

## 2016-02-27 NOTE — Telephone Encounter (Signed)
Pt just saw Dr. Hassell Done, she wants pt to have a EKG and an x-ray done at ED. Pt does not want to go to ED. She was wondering if orders could be put  Into system so she does not have to sit at ED. Please call her on cell phone.

## 2016-02-27 NOTE — Telephone Encounter (Signed)
Please advise 

## 2016-05-10 ENCOUNTER — Encounter: Payer: Self-pay | Admitting: Emergency Medicine

## 2016-05-10 ENCOUNTER — Emergency Department: Payer: Commercial Managed Care - HMO

## 2016-05-10 ENCOUNTER — Emergency Department
Admission: EM | Admit: 2016-05-10 | Discharge: 2016-05-10 | Disposition: A | Discharge status: Home / Self Care | Payer: Commercial Managed Care - HMO | Attending: Emergency Medicine | Admitting: Emergency Medicine

## 2016-05-10 DIAGNOSIS — R1011 Right upper quadrant pain: Secondary | ICD-10-CM | POA: Insufficient documentation

## 2016-05-10 DIAGNOSIS — F17210 Nicotine dependence, cigarettes, uncomplicated: Secondary | ICD-10-CM | POA: Diagnosis not present

## 2016-05-10 DIAGNOSIS — Z79899 Other long term (current) drug therapy: Secondary | ICD-10-CM | POA: Insufficient documentation

## 2016-05-10 DIAGNOSIS — J45909 Unspecified asthma, uncomplicated: Secondary | ICD-10-CM | POA: Insufficient documentation

## 2016-05-10 DIAGNOSIS — Z7982 Long term (current) use of aspirin: Secondary | ICD-10-CM | POA: Diagnosis not present

## 2016-05-10 DIAGNOSIS — I10 Essential (primary) hypertension: Secondary | ICD-10-CM | POA: Diagnosis not present

## 2016-05-10 DIAGNOSIS — M549 Dorsalgia, unspecified: Secondary | ICD-10-CM | POA: Diagnosis not present

## 2016-05-10 LAB — COMPREHENSIVE METABOLIC PANEL
ALBUMIN: 4 g/dL (ref 3.5–5.0)
ALK PHOS: 62 U/L (ref 38–126)
ALT: 17 U/L (ref 14–54)
ANION GAP: 6 (ref 5–15)
AST: 21 U/L (ref 15–41)
BILIRUBIN TOTAL: 0.5 mg/dL (ref 0.3–1.2)
BUN: 12 mg/dL (ref 6–20)
CALCIUM: 9.2 mg/dL (ref 8.9–10.3)
CO2: 27 mmol/L (ref 22–32)
CREATININE: 0.9 mg/dL (ref 0.44–1.00)
Chloride: 106 mmol/L (ref 101–111)
GFR calc non Af Amer: >60 mL/min (ref >60)
GLUCOSE: 121 mg/dL — AB (ref 65–99)
Potassium: 3.8 mmol/L (ref 3.5–5.1)
Sodium: 139 mmol/L (ref 135–145)
TOTAL PROTEIN: 7 g/dL (ref 6.5–8.1)

## 2016-05-10 LAB — CBC WITH DIFFERENTIAL/PLATELET
Basophils Absolute: 0 10*3/uL (ref 0–0.1)
Basophils Relative: 1 %
Eosinophils Absolute: 0.2 10*3/uL (ref 0–0.7)
Eosinophils Relative: 4 %
HEMATOCRIT: 43.4 % (ref 35.0–47.0)
HEMOGLOBIN: 15 g/dL (ref 12.0–16.0)
LYMPHS ABS: 2.1 10*3/uL (ref 1.0–3.6)
Lymphocytes Relative: 38 %
MCH: 31.6 pg (ref 26.0–34.0)
MCHC: 34.7 g/dL (ref 32.0–36.0)
MCV: 91.3 fL (ref 80.0–100.0)
MONOS PCT: 8 %
Monocytes Absolute: 0.5 10*3/uL (ref 0.2–0.9)
NEUTROS ABS: 2.8 10*3/uL (ref 1.4–6.5)
NEUTROS PCT: 49 %
Platelets: 204 10*3/uL (ref 150–440)
RBC: 4.75 MIL/uL (ref 3.80–5.20)
RDW: 13.1 % (ref 11.5–14.5)
WBC: 5.6 10*3/uL (ref 3.6–11.0)

## 2016-05-10 LAB — LIPASE, BLOOD: Lipase: 26 U/L (ref 11–51)

## 2016-05-10 MED ORDER — MORPHINE SULFATE (PF) 4 MG/ML IV SOLN
4.0000 mg | Freq: Once | INTRAVENOUS | Status: AC
  Administered 2016-05-10: 4 mg via INTRAVENOUS

## 2016-05-10 MED ORDER — CYCLOBENZAPRINE HCL 5 MG PO TABS: 5.0000 mg | ORAL_TABLET | Freq: Three times a day (TID) | ORAL | 0 refills | Status: DC | PRN

## 2016-05-10 MED ORDER — MORPHINE SULFATE (PF) 4 MG/ML IV SOLN
INTRAVENOUS | Status: AC
  Administered 2016-05-10: 4 mg via INTRAVENOUS
  Filled 2016-05-10: qty 1

## 2016-05-10 MED ORDER — ONDANSETRON HCL 4 MG/2ML IJ SOLN
4.0000 mg | Freq: Once | INTRAMUSCULAR | Status: AC
  Administered 2016-05-10: 4 mg via INTRAVENOUS

## 2016-05-10 MED ORDER — ONDANSETRON HCL 4 MG/2ML IJ SOLN
INTRAMUSCULAR | Status: AC
  Administered 2016-05-10: 4 mg via INTRAVENOUS
  Filled 2016-05-10: qty 2

## 2016-05-10 MED ORDER — IOPAMIDOL (ISOVUE-300) INJECTION 61%
100.0000 mL | Freq: Once | INTRAVENOUS | Status: AC | PRN
  Administered 2016-05-10: 100 mL via INTRAVENOUS

## 2016-05-10 MED ORDER — ONDANSETRON 4 MG PO TBDP
4.0000 mg | ORAL_TABLET | Freq: Once | ORAL | Status: AC
  Administered 2016-05-10: 4 mg via ORAL
  Filled 2016-05-10: qty 1

## 2016-05-10 MED ORDER — IOPAMIDOL (ISOVUE-300) INJECTION 61%: 30.0000 mL | INTRAVENOUS | Status: DC

## 2016-05-10 MED ORDER — GI COCKTAIL ~~LOC~~
30.0000 mL | Freq: Once | ORAL | Status: AC
  Administered 2016-05-10: 30 mL via ORAL
  Filled 2016-05-10: qty 30

## 2016-05-10 MED ORDER — KETOROLAC TROMETHAMINE 30 MG/ML IJ SOLN
30.0000 mg | Freq: Once | INTRAMUSCULAR | Status: AC
  Administered 2016-05-10: 30 mg via INTRAVENOUS
  Filled 2016-05-10: qty 1

## 2016-05-10 MED ORDER — IOPAMIDOL (ISOVUE-300) INJECTION 61%
30.0000 mL | Freq: Once | INTRAVENOUS | Status: AC
  Administered 2016-05-10: 30 mL via ORAL

## 2016-05-10 MED ORDER — IOPAMIDOL (ISOVUE-300) INJECTION 61%: 30.0000 mL | Freq: Once | INTRAVENOUS | Status: DC | PRN

## 2016-05-10 MED ORDER — CYCLOBENZAPRINE HCL 10 MG PO TABS
5.0000 mg | ORAL_TABLET | Freq: Once | ORAL | Status: AC
  Administered 2016-05-10: 5 mg via ORAL
  Filled 2016-05-10: qty 1

## 2016-05-10 NOTE — ED Notes (Signed)
Assisted patient to the restroom, and took the patient a warm blanket, water to drink and a pillow.

## 2016-05-10 NOTE — Discharge Instructions (Signed)
Please seek medical attention for any high fevers, chest pain, shortness of breath, change in behavior, persistent vomiting, bloody stool or any other new or concerning symptoms.  

## 2016-05-10 NOTE — ED Triage Notes (Signed)
Ems from home for back spasms that radiate to front of chest 6/10 pain. Pt took 2 325 aspirin prior to ems arrival.

## 2016-05-10 NOTE — ED Provider Notes (Signed)
[facility] Emergency Department Provider Note   ____________________________________________   I have reviewed the triage vital signs and the nursing notes.   HISTORY  Chief Complaint Back Pain   History limited by: Not Limited   HPI Monica Larson is a 70 y.o. female who presents to the emergency department today because of abdominal and back pain.The pain is located in the mid back and right upper quadrant. It is severe. It started this morning. It has been constant. She has had similar pain in the past but it has always gone away on its own. When she moves or rolls over in certain ways it makes the pain worse. She had some associated nausea earlier. Denies any change in defecation or urination. No fevers.   Past Medical History:  Diagnosis Date  . Arthritis    HANDS/NECK/SHOULDERS  . Asthma    SEASONAL  . Diarrhea    INTERMITTENT CHRONIC  . Fatigue   . GERD (gastroesophageal reflux disease)   . Headache    SINUS/ ALLERGIES  . HLD (hyperlipidemia)    borderline  . HTN (hypertension)    CONTROLLED ON MEDS  . Motion sickness   . Seasonal allergies   . Shortness of breath dyspnea    ON EXERTION    Patient Active Problem List   Diagnosis Date Noted  . Diarrhea 01/10/2016  . Postmenopausal estrogen deficiency 04/10/2015  . Leg cramps 03/23/2015  . Arthralgia 03/23/2015  . Adjustment disorder with mixed anxiety and depressed mood 03/23/2015  . Medicare annual wellness visit, subsequent 11/07/2014  . Basal cell carcinoma 07/28/2014  . Urinary incontinence 10/25/2013  . Overweight (BMI 25.0-29.9) 07/20/2013  . Insomnia 07/20/2013  . Screening for breast cancer 06/22/2012  . Hypertension 05/14/2011    Past Surgical History:  Procedure Laterality Date  . CARPAL TUNNEL RELEASE     right wrist  . CHOLECYSTECTOMY    . COLONOSCOPY N/A 01/31/2016   Procedure: COLONOSCOPY;  Surgeon: Hulen Luster, MD;  Location: [place];  Service:  Gastroenterology;  Laterality: N/A;  . EYE SURGERY     muscular as child/ CATARACT BILATERAL  . SKIN CANCER EXCISION Right Wrist and thigh (x2)   Unsure of dates and Dermatologist  . [place] W/ TAH      Prior to Admission medications   Medication Sig Start Date End Date Taking? Authorizing Provider  aspirin (ASPIR-LOW) 81 MG EC tablet Take 81 mg by mouth daily. AM    Historical Provider, MD  DiphenhydrAMINE HCl (BENADRYL ALLERGY PO) Take 25 mg by mouth daily as needed.     Historical Provider, MD  Glucos-Chond-Hyal Ac-Ca Fructo (MOVE FREE JOINT HEALTH ADVANCE PO) Take 1 tablet by mouth 2 (two) times daily. Reported on 02/12/2016    Historical Provider, MD  lisinopril (PRINIVIL,ZESTRIL) 30 MG tablet Take 1 tablet (30 mg total) by mouth daily. 02/12/16   Jackolyn Confer, MD  Multiple Vitamins-Minerals (MULTIVITAMIN WITH MINERALS) tablet Take 1 tablet by mouth daily. AM    Historical Provider, MD  oxybutynin (DITROPAN) 5 MG tablet Take 2 tablets (10 mg total) by mouth daily. 02/12/16   Jackolyn Confer, MD  traZODone (DESYREL) 150 MG tablet Take 1 tablet (150 mg total) by mouth at bedtime. Patient taking differently: Take 150 mg by mouth at bedtime as needed.  08/31/15   Jackolyn Confer, MD  vitamin B-12 (CYANOCOBALAMIN) 1000 MCG tablet Take 1,000 mcg by mouth daily. AM    Historical Provider, MD  Allergies Sulfa antibiotics and Sulfonamide derivatives  Family History  Problem Relation Age of Onset  . Ovarian cancer Mother   . Prostate cancer Father   . Coronary artery disease Father   . Diabetes Father   . Heart attack Brother     Social History Social History  Substance Use Topics  . Smoking status: Never Smoker  . Smokeless tobacco: Never Used     Comment: 1 cig every 3 months   . Alcohol use Yes     Comment: 1 MIXED DRINK/WEEK    Review of Systems  Constitutional: Negative for fever. Cardiovascular: Negative for chest pain. Respiratory: Negative  for shortness of breath. Gastrointestinal: Positive for RUQ pain. Genitourinary: Negative for dysuria. Musculoskeletal:Positive for back pain. Skin: Negative for rash. Neurological: Negative for headaches, focal weakness or numbness.  10-point ROS otherwise negative.  ____________________________________________   PHYSICAL EXAM:  VITAL SIGNS: ED Triage Vitals  Enc Vitals Group     BP 175/68     Pulse 60     Resp 12     Temp 97.8     Temp src      SpO2 100     Weight    Constitutional: Alert and oriented. Appears somewhat uncomfortable. Eyes: Conjunctivae are normal. Normal extraocular movements. ENT   Head: Normocephalic and atraumatic.   Nose: No congestion/rhinnorhea.   Mouth/Throat: Mucous membranes are moist.   Neck: No stridor. Hematological/Lymphatic/Immunilogical: No cervical lymphadenopathy. Cardiovascular: Normal rate, regular rhythm.  No murmurs, rubs, or gallops. Respiratory: Normal respiratory effort without tachypnea nor retractions. Breath sounds are clear and equal bilaterally. No wheezes/rales/rhonchi. Gastrointestinal: Soft and nontender. No distention.  Genitourinary: Deferred Musculoskeletal: Normal range of motion in all extremities. No lower extremity edema. Neurologic:  Normal speech and language. No gross focal neurologic deficits are appreciated.  Skin:  Skin is warm, dry and intact. No rash noted. Psychiatric: Mood and affect are normal. Speech and behavior are normal. Patient exhibits appropriate insight and judgment.  ____________________________________________    LABS (pertinent positives/negatives)  Labs Reviewed  COMPREHENSIVE METABOLIC PANEL - Abnormal; Notable for the following:       Result Value   Glucose, Bld 121 (*)    All other components within normal limits  CBC WITH DIFFERENTIAL/PLATELET  LIPASE, BLOOD     ____________________________________________   EKG  I, Nance Pear, attending physician,  personally viewed and interpreted this EKG  EKG Time: 0910 Rate: 58 Rhythm: sinus bradycardia Axis: normal Intervals: qtc 401 QRS: narrow ST changes: no st elevation Impression: sinus bradycardia otherwise normal ekg  ____________________________________________    RADIOLOGY  CT abd/pel IMPRESSION:  1. No acute findings within the abdomen or pelvis and no explanation  for patient's right upper quadrant pain.  2. Previous cholecystectomy  3. Aortic atherosclerosis.       ____________________________________________   PROCEDURES  Procedures  ____________________________________________   INITIAL IMPRESSION / ASSESSMENT AND PLAN / ED COURSE  Pertinent labs & imaging results that were available during my care of the patient were reviewed by me and considered in my medical decision making (see chart for details).  Patient presented to the emergency department today because of concerns for right upper abdomen/right lower chest. Patient states she has had similar pain on and off for some time. Patient denies any fevers. No shortness breath. Will check blood work.  Clinical Course   Patient any pain even after pain medication. Blood work without any concerning findings. A CT scan was obtained which did not show any  obvious cause of the pain. Patient did state that the Flexeril did somewhat help. Will plan on discharging with. Follow-up with primary care. ____________________________________________   FINAL CLINICAL IMPRESSION(S) / ED DIAGNOSES  Final diagnoses:  Right upper quadrant pain     Note: This dictation was prepared with Dragon dictation. Any transcriptional errors that result from this process are unintentional    Nance Pear, MD 05/10/16 [phone]

## 2016-05-15 ENCOUNTER — Ambulatory Visit: Payer: Commercial Managed Care - HMO | Admitting: Internal Medicine

## 2016-05-31 ENCOUNTER — Ambulatory Visit (INDEPENDENT_AMBULATORY_CARE_PROVIDER_SITE_OTHER): Payer: Commercial Managed Care - HMO | Admitting: Family Medicine

## 2016-05-31 ENCOUNTER — Encounter: Payer: Self-pay | Admitting: Family Medicine

## 2016-05-31 VITALS — BP 118/68 | HR 77 | Temp 98.2°F | Wt 180.4 lb

## 2016-05-31 DIAGNOSIS — R079 Chest pain, unspecified: Secondary | ICD-10-CM | POA: Insufficient documentation

## 2016-05-31 DIAGNOSIS — R252 Cramp and spasm: Secondary | ICD-10-CM | POA: Diagnosis not present

## 2016-05-31 DIAGNOSIS — N649 Disorder of breast, unspecified: Secondary | ICD-10-CM

## 2016-05-31 DIAGNOSIS — I10 Essential (primary) hypertension: Secondary | ICD-10-CM | POA: Diagnosis not present

## 2016-05-31 NOTE — Assessment & Plan Note (Signed)
Patient's chest pain seems consistent with stable angina. Recent EKG report in ED note reviewed. No copy of the EKG available at this time. No current chest pain. Given typical nature we will refer to cardiology for evaluation. She's given return precautions.

## 2016-05-31 NOTE — Progress Notes (Signed)
Pre visit review using our clinic review tool, if applicable. No additional management support is needed unless otherwise documented below in the visit note. 

## 2016-05-31 NOTE — Patient Instructions (Signed)
Nice to meet you. Please continue to stay well hydrated with regards to your cramps. We'll refer you to cardiology for your chest pain. We will obtain a mammogram to evaluate the area in her breast. If you develop persistent chest pain, or develop shortness of breath, movement of your chest pain, sweatiness, or any new or changing symptoms please seek medical attention immediately.

## 2016-05-31 NOTE — Assessment & Plan Note (Signed)
At goal today. Continue lisinopril.

## 2016-05-31 NOTE — Assessment & Plan Note (Signed)
Suspect area was a cyst or small abscess that is draining on its own. Current defect palpated likely scar tissue though given lack of mammogram in the last several years we will order mammogram to evaluate further

## 2016-05-31 NOTE — Progress Notes (Signed)
[name], MD Phone: [phone]  Monica Larson is a 70 y.o. female who presents today for follow-up.  HYPERTENSION  Disease Monitoring  Home BP Monitoring not checking Chest pain- yes, see below    Dyspnea- no Medications  Compliance-  taking lisinopril. Lightheadedness-  no  Edema- no  Chest pain: Patient notes over the last 6 or so months she gets chest pain about once a week when she is physically active. Notes it is a centralized pressure sensation. She is sweaty with it. It does not radiate. No shortness of breath. It resolves with rest. She has no known cardiac history. Father had an MI and brother had an MI as well. Recently seen in the ED and had an EKG per review of the ED physician's note that per the impression was sinus bradycardia with no other changes. She has not seen a cardiologist in many years.  Patient was seen in the ED for right sided cramps. Started in her right back and came around to her right upper quadrant. She was sore after this. She states it was so bad that she couldn't move with this. Went to the ED and had a workup that was unrevealing of cause. They treated her with Flexeril for muscle spasms. She has a long history of spasms and leg cramps. Has had workup in the past with no cause noted. Drinks plenty of fluid. Cramps usually occur when she is more active. She's even tried quinine for this.  Patient additionally notes one week ago there was a spot in between her breasts that popped and oozed. This oozed for 2-3 days and stopped. There has been a knot in this area since then. She's not had any fevers. She's not had a mammogram in several years.   PMH: nonsmoker.   ROS see history of present illness  Objective  Physical Exam Vitals:   05/31/16 0806  BP: 118/68  Pulse: 77  Temp: 98.2 F (36.8 C)    BP Readings from Last 3 Encounters:  05/31/16 118/68  05/10/16 (!) 180/97  02/12/16 (!) 164/88   Wt Readings from Last 3 Encounters:  05/31/16  180 lb 6.4 oz (81.8 kg)  05/10/16 191 lb 12.8 oz (87 kg)  02/12/16 182 lb 6.4 oz (82.7 kg)    Physical Exam  Constitutional: No distress.  HENT:  Head: Normocephalic and atraumatic.  Cardiovascular: Normal rate, regular rhythm and normal heart sounds.   Pulmonary/Chest: Effort normal and breath sounds normal.    Abdominal: Soft. Bowel sounds are normal. She exhibits no distension. There is no tenderness. There is no rebound and no guarding.  Musculoskeletal: She exhibits no edema.  No right flank or muscular back tenderness  Neurological: She is alert. Gait normal.  Skin: Skin is warm and dry. She is not diaphoretic.     Assessment/Plan: Please see individual problem list.  Pain in the chest Patient's chest pain seems consistent with stable angina. Recent EKG report in ED note reviewed. No copy of the EKG available at this time. No current chest pain. Given typical nature we will refer to cardiology for evaluation. She's given return precautions.  Breast lesion Suspect area was a cyst or small abscess that is draining on its own. Current defect palpated likely scar tissue though given lack of mammogram in the last several years we will order mammogram to evaluate further  Leg cramps Chronic issue. Suspect spasm and cramp in her right side related to similar issue given negative workup in the  ED. Prior workup apparently unremarkable. Benign exam. Encouraged hydration. She'll continue to monitor.  Hypertension At goal today. Continue lisinopril.   Orders Placed This Encounter  Procedures  . MM Digital Diagnostic Bilat    Standing Status:   Future    Standing Expiration Date:   07/31/2017    Order Specific Question:   Reason for Exam (SYMPTOM  OR DIAGNOSIS REQUIRED)    Answer:   small palpable lesion lef mid breast at 9 oclock    Order Specific Question:   Preferred imaging location?    Answer:    [facility]  . Ambulatory referral to Cardiology    Referral Priority:    Routine    Referral Type:   Consultation    Referral Reason:   Specialty Services Required    Requested Specialty:   Cardiology    Number of Visits Requested:   [place], MD [place]

## 2016-05-31 NOTE — Assessment & Plan Note (Addendum)
Chronic issue. Suspect spasm and cramp in her right side related to similar issue given negative workup in the ED. Prior workup apparently unremarkable. Benign exam. Encouraged hydration. She'll continue to monitor.

## 2016-06-11 ENCOUNTER — Ambulatory Visit (INDEPENDENT_AMBULATORY_CARE_PROVIDER_SITE_OTHER): Payer: Commercial Managed Care - HMO

## 2016-06-11 ENCOUNTER — Ambulatory Visit: Payer: Commercial Managed Care - HMO | Admitting: Cardiology

## 2016-06-11 DIAGNOSIS — Z23 Encounter for immunization: Secondary | ICD-10-CM

## 2016-06-11 NOTE — Progress Notes (Signed)
Patient received flu injection

## 2016-06-17 ENCOUNTER — Ambulatory Visit (INDEPENDENT_AMBULATORY_CARE_PROVIDER_SITE_OTHER): Payer: Commercial Managed Care - HMO | Admitting: Family Medicine

## 2016-06-17 ENCOUNTER — Encounter: Payer: Self-pay | Admitting: Family Medicine

## 2016-06-17 ENCOUNTER — Other Ambulatory Visit: Payer: Self-pay | Admitting: Surgical

## 2016-06-17 VITALS — BP 130/70 | HR 65 | Temp 97.8°F | Wt 182.0 lb

## 2016-06-17 DIAGNOSIS — Z87898 Personal history of other specified conditions: Secondary | ICD-10-CM

## 2016-06-17 DIAGNOSIS — N3001 Acute cystitis with hematuria: Secondary | ICD-10-CM

## 2016-06-17 DIAGNOSIS — N390 Urinary tract infection, site not specified: Secondary | ICD-10-CM | POA: Insufficient documentation

## 2016-06-17 DIAGNOSIS — N649 Disorder of breast, unspecified: Secondary | ICD-10-CM

## 2016-06-17 DIAGNOSIS — R300 Dysuria: Secondary | ICD-10-CM

## 2016-06-17 HISTORY — DX: Personal history of other specified conditions: Z87.898

## 2016-06-17 LAB — POCT URINALYSIS DIP (MANUAL ENTRY)
BILIRUBIN UA: NEGATIVE
Glucose, UA: NEGATIVE
Ketones, POC UA: NEGATIVE
NITRITE UA: NEGATIVE
PH UA: 5.5
Spec Grav, UA: 1.025
Urobilinogen, UA: 0.2

## 2016-06-17 LAB — URINALYSIS, MICROSCOPIC ONLY

## 2016-06-17 MED ORDER — CEPHALEXIN 500 MG PO CAPS: 500.0000 mg | ORAL_CAPSULE | Freq: Two times a day (BID) | ORAL | 0 refills | Status: DC

## 2016-06-17 MED ORDER — SCOPOLAMINE 1 MG/3DAYS TD PT72: 1.0000 | MEDICATED_PATCH | TRANSDERMAL | 0 refills | Status: DC

## 2016-06-17 NOTE — Progress Notes (Signed)
  [name], MD Phone: [phone]  Monica Larson is a 70 y.o. female who presents today for a same day visit.  UTI: Patient notes 2-3 days of urinary frequency, urgency, and dysuria. Some minor chills. No fevers. Minimal suprapubic discomfort. No vaginal discharge. Notes she took a bubble bath prior to the onset of her symptoms. Has a history of UTIs treated with Cipro. Has a history of hysterectomy.  Patient reports a history of motion sickness with plane flights and boat rides. She is going to [place] in several weeks and notes the last time she was there she had significant motion sickness that responded to scopolamine patches.  PMH: nonsmoker.   ROS see history of present illness  Objective  Physical Exam Vitals:   06/17/16 1026  BP: 130/70  Pulse: 65  Temp: 97.8 F (36.6 C)    BP Readings from Last 3 Encounters:  06/17/16 130/70  05/31/16 118/68  05/10/16 (!) 180/97   Wt Readings from Last 3 Encounters:  06/17/16 182 lb (82.6 kg)  05/31/16 180 lb 6.4 oz (81.8 kg)  05/10/16 191 lb 12.8 oz (87 kg)    Physical Exam  Constitutional: She is well-developed, well-nourished, and in no distress.  Cardiovascular: Normal rate, regular rhythm and normal heart sounds.   Pulmonary/Chest: Effort normal and breath sounds normal.  Abdominal: Soft. Bowel sounds are normal. She exhibits no distension. There is tenderness (Minimal suprapubic tenderness that the patient describes as touchy). There is no rebound and no guarding.  No CVA tenderness  Neurological: She is alert. Gait normal.  Skin: Skin is warm and dry.     Assessment/Plan: Please see individual problem list.  UTI (urinary tract infection) Patient's symptoms and UA most consistent with UTI. Minimal suprapubic tenderness though no other abnormal abdominal exam findings. Stable vital signs. We'll treat with Keflex. We'll send urine for culture and microscopy. She is given return precautions.  H/O motion  sickness Patient with a history of motion sickness on planes and boat's. Getting ready to travel to [place]. We'll provide with scopolamine patches. Discussed potential side effects.   Orders Placed This Encounter  Procedures  . Urine Culture  . Urine Microscopic Only  . POCT urinalysis dipstick    Meds ordered this encounter  Medications  . DISCONTD: cephALEXin (KEFLEX) 500 MG capsule    Sig: Take 1 capsule (500 mg total) by mouth 2 (two) times daily.    Dispense:  14 capsule    Refill:  0  . DISCONTD: scopolamine (TRANSDERM-SCOP) 1 MG/3DAYS    Sig: Place 1 patch (1.5 mg total) onto the skin every 3 (three) days.    Dispense:  10 patch    Refill:  0  . cephALEXin (KEFLEX) 500 MG capsule    Sig: Take 1 capsule (500 mg total) by mouth 2 (two) times daily.    Dispense:  14 capsule    Refill:  0  . scopolamine (TRANSDERM-SCOP) 1 MG/3DAYS    Sig: Place 1 patch (1.5 mg total) onto the skin every 3 (three) days.    Dispense:  10 patch    Refill:  0   [name], MD [place]

## 2016-06-17 NOTE — Assessment & Plan Note (Signed)
Patient's symptoms and UA most consistent with UTI. Minimal suprapubic tenderness though no other abnormal abdominal exam findings. Stable vital signs. We'll treat with Keflex. We'll send urine for culture and microscopy. She is given return precautions.

## 2016-06-17 NOTE — Patient Instructions (Signed)
Nice to see you. You have a we will treat this with Keflex. We'll send your urine for culture and microscopy. I provided you with a scopolamine patch for motion sickness. If this makes you drowsy please let us know. If you develop abdominal pain, fevers, sweats, or any new or changing symptoms please seek medical attention.

## 2016-06-17 NOTE — Assessment & Plan Note (Signed)
Patient with a history of motion sickness on planes and boat's. Getting ready to travel to [place]. We'll provide with scopolamine patches. Discussed potential side effects.

## 2016-06-19 LAB — URINE CULTURE

## 2016-06-25 ENCOUNTER — Ambulatory Visit: Payer: Commercial Managed Care - HMO | Admitting: Cardiology

## 2016-06-26 ENCOUNTER — Encounter: Payer: Self-pay | Admitting: Cardiology

## 2016-06-26 ENCOUNTER — Ambulatory Visit (INDEPENDENT_AMBULATORY_CARE_PROVIDER_SITE_OTHER): Payer: Commercial Managed Care - HMO | Admitting: Cardiology

## 2016-06-26 VITALS — BP 140/70 | HR 62 | Ht 67.5 in | Wt 181.4 lb

## 2016-06-26 DIAGNOSIS — R079 Chest pain, unspecified: Secondary | ICD-10-CM | POA: Diagnosis not present

## 2016-06-26 DIAGNOSIS — I10 Essential (primary) hypertension: Secondary | ICD-10-CM | POA: Diagnosis not present

## 2016-06-26 DIAGNOSIS — Z7689 Persons encountering health services in other specified circumstances: Secondary | ICD-10-CM

## 2016-06-26 DIAGNOSIS — R0602 Shortness of breath: Secondary | ICD-10-CM | POA: Diagnosis not present

## 2016-06-26 NOTE — Progress Notes (Signed)
Cardiology Office Note   Date:  06/26/2016   ID:  Monica, Larson 03-Feb-1946, MRN [id number]  Referring Doctor:  [name], MD   Cardiologist:   Wende Bushy, MD   Reason for consultation:  Chief Complaint  Patient presents with  . Establish Care   Chest pain shortness of breath   History of Present Illness: Monica Larson is a 70 y.o. female who presents for episodes of chest pain and shortness of breath.  Chest pain has been going on for several months now. She describes it as a tightness and heaviness in the chest. Not always exertional in nature, some episodes do occur with exertion. Mild to moderate in intensity, lasting a few minutes at a time, resolving spontaneously.  Patient also has shortness of breath for probably a year or so. She has noticed progression in terms of decreasing distance before shortness of breath is noted. Moderate in intensity.  Patient denies loss of consciousness, PND, orthopnea, edema. No palpitations. Patient did have some episodes of possible abdominal pain with cramping sensation for which she presented to the ER.   ROS:  Please see the history of present illness. Aside from mentioned under HPI, all other systems are reviewed and negative.     Past Medical History:  Diagnosis Date  . Arthritis    HANDS/NECK/SHOULDERS  . Asthma    SEASONAL  . Diarrhea    INTERMITTENT CHRONIC  . Fatigue   . GERD (gastroesophageal reflux disease)   . Headache    SINUS/ ALLERGIES  . HLD (hyperlipidemia)    borderline  . HTN (hypertension)    CONTROLLED ON MEDS  . Motion sickness   . Seasonal allergies   . Shortness of breath dyspnea    ON EXERTION    Past Surgical History:  Procedure Laterality Date  . CARPAL TUNNEL RELEASE     right wrist  . CHOLECYSTECTOMY    . COLONOSCOPY N/A 01/31/2016   Procedure: COLONOSCOPY;  Surgeon: Hulen Luster, MD;  Location: [facility];  Service: Gastroenterology;  Laterality: N/A;  . EYE SURGERY     muscular as child/ CATARACT BILATERAL  . SKIN CANCER EXCISION Right Wrist and thigh (x2)   Unsure of dates and Dermatologist  . Alpha W/ TAH       reports that she has never smoked. She has never used smokeless tobacco. She reports that she drinks alcohol. She reports that she does not use drugs.   family history includes Coronary artery disease in her father; Diabetes in her father; Heart attack in her brother; Ovarian cancer in her mother; Prostate cancer in her father.   Outpatient Medications Prior to Visit  Medication Sig Dispense Refill  . aspirin (ASPIR-LOW) 81 MG EC tablet Take 81 mg by mouth daily.     . cyclobenzaprine (FLEXERIL) 5 MG tablet Take 1 tablet (5 mg total) by mouth 3 (three) times daily as needed for muscle spasms. 20 tablet 0  . diphenhydrAMINE (BENADRYL) 25 MG tablet Take 25 mg by mouth daily as needed for itching, allergies or sleep.    . Glucos-Chond-Hyal Ac-Ca Fructo (MOVE FREE JOINT HEALTH ADVANCE PO) Take 1 tablet by mouth 2 (two) times daily. Reported on 02/12/2016    . lisinopril (PRINIVIL,ZESTRIL) 30 MG tablet Take 1 tablet (30 mg total) by mouth daily. 90 tablet 3  . Multiple Vitamins-Minerals (MULTIVITAMIN WITH MINERALS) tablet Take 1 tablet by mouth daily.     Marland Kitchen omeprazole (PRILOSEC) 20  MG capsule Take 20 mg by mouth daily as needed. For acid reflux    . oxybutynin (DITROPAN) 5 MG tablet Take 2 tablets (10 mg total) by mouth daily. 180 tablet 3  . traZODone (DESYREL) 150 MG tablet Take 1 tablet (150 mg total) by mouth at bedtime. 90 tablet 3  . vitamin B-12 (CYANOCOBALAMIN) 1000 MCG tablet Take 1,000 mcg by mouth daily.     . cephALEXin (KEFLEX) 500 MG capsule Take 1 capsule (500 mg total) by mouth 2 (two) times daily. 14 capsule 0  . scopolamine (TRANSDERM-SCOP) 1 MG/3DAYS Place 1 patch (1.5 mg total) onto the skin every 3 (three) days. 10 patch 0   No facility-administered medications prior to visit.      Allergies: Sulfa  antibiotics and Sulfonamide derivatives    PHYSICAL EXAM: VS:  BP 140/70 (BP Location: Right Arm, Patient Position: Sitting, Cuff Size: Normal)   Pulse 62   Ht 5' 7.5" (1.715 m)   Wt 181 lb 6.4 oz (82.3 kg)   SpO2 95%   BMI 27.99 kg/m  , Body mass index is 27.99 kg/m. Wt Readings from Last 3 Encounters:  06/26/16 181 lb 6.4 oz (82.3 kg)  06/17/16 182 lb (82.6 kg)  05/31/16 180 lb 6.4 oz (81.8 kg)    GENERAL:  well developed, well nourished, not in acute distress HEENT: normocephalic, pink conjunctivae, anicteric sclerae, no xanthelasma, normal dentition, oropharynx clear NECK:  no neck vein engorgement, JVP normal, no hepatojugular reflux, carotid upstroke brisk and symmetric, no bruit, no thyromegaly, no lymphadenopathy LUNGS:  good respiratory effort, clear to auscultation bilaterally CV:  PMI not displaced, no thrills, no lifts, S1 and S2 within normal limits, no palpable S3 or S4, no murmurs, no rubs, no gallops ABD:  Soft, nontender, nondistended, normoactive bowel sounds, no abdominal aortic bruit, no hepatomegaly, no splenomegaly MS: nontender back, no kyphosis, no scoliosis, no joint deformities EXT:  2+ DP/PT pulses, no edema, no varicosities, no cyanosis, no clubbing SKIN: warm, nondiaphoretic, normal turgor, no ulcers NEUROPSYCH: alert, oriented to person, place, and time, sensory/motor grossly intact, normal mood, appropriate affect  Recent Labs: 05/10/2016: ALT 17; BUN 12; Creatinine, Ser 0.90; Hemoglobin 15.0; Platelets 204; Potassium 3.8; Sodium 139   Lipid Panel    Component Value Date/Time   CHOL 233 (H) 08/31/2015 0759   TRIG 102.0 08/31/2015 0759   HDL 51.90 08/31/2015 0759   CHOLHDL 4 08/31/2015 0759   VLDL 20.4 08/31/2015 0759   LDLCALC 161 (H) 08/31/2015 0759   LDLDIRECT 162.1 07/20/2013 0941     Other studies Reviewed:  EKG:  The ekg from 06/26/2016 was personally reviewed by me and it revealed sinus rhythm, 56 BPM. Low-voltage QRS.  Additional  studies/ records that were reviewed personally reviewed by me today include: None available   ASSESSMENT AND PLAN:  Chest pain Shortness breath Risk factors for CAD include age, hypertension, family history of premature CAD. Patient unable to walk on the treadmill. Recommend pharmacologic nuclear stress testing and echocardiogram for further evaluation.  Hypertension BP is well controlled. Continue monitoring BP. Continue current medical therapy and lifestyle changes.  Current medicines are reviewed at length with the patient today.  The patient does not have concerns regarding medicines.  Labs/ tests ordered today include:  Orders Placed This Encounter  Procedures  . NM Myocar Multi W/Spect W/Wall Motion / EF  . EKG 12-Lead  . ECHOCARDIOGRAM COMPLETE    I had a lengthy and detailed discussion with the patient regarding diagnoses,  prognosis, diagnostic options, treatment options , and side effects of medications.   I counseled the patient on importance of lifestyle modification including heart healthy diet, regular physical activity Once cardiac workup is complete , and smoking cessation.   Disposition:   FU with undersigned after tests    Signed, Wende Bushy, MD  06/26/2016 5:02 PM    [place]  This note was generated in part with voice recognition software and I apologize for any typographical errors that were not detected and corrected.

## 2016-06-26 NOTE — Patient Instructions (Addendum)
Testing/Procedures: Your physician has requested that you have an echocardiogram. Echocardiography is a painless test that uses sound waves to create images of your heart. It provides your doctor with information about the size and shape of your heart and how well your heart's chambers and valves are working. This procedure takes approximately one hour. There are no restrictions for this procedure.  [place]  Your caregiver has ordered a Stress Test with nuclear imaging. The purpose of this test is to evaluate the blood supply to your heart muscle. This procedure is referred to as a "Non-Invasive Stress Test." This is because other than having an IV started in your vein, nothing is inserted or "invades" your body. Cardiac stress tests are done to find areas of poor blood flow to the heart by determining the extent of coronary artery disease (CAD). Some patients exercise on a treadmill, which naturally increases the blood flow to your heart, while others who are  unable to walk on a treadmill due to physical limitations have a pharmacologic/chemical stress agent called Lexiscan . This medicine will mimic walking on a treadmill by temporarily increasing your coronary blood flow.   Please note: these test may take anywhere between 2-4 hours to complete  PLEASE REPORT TO [place] AT THE FIRST DESK WILL DIRECT YOU WHERE TO GO  Date of Procedure:_Tuesday July 02, 2016 at 08:00AM__  Arrival Time for Procedure:_Please arrive at 07:45AM to register_    PLEASE NOTIFY THE OFFICE AT LEAST 24 HOURS IN ADVANCE IF YOU ARE UNABLE TO [place].  [phone] AND  PLEASE NOTIFY NUCLEAR MEDICINE AT [facility] AT LEAST 24 HOURS IN ADVANCE IF YOU ARE UNABLE TO KEEP YOUR APPOINTMENT. [phone]  How to prepare for your Myoview test:  1. Do not eat or drink after midnight 2. No caffeine for 24 hours prior to test 3. No smoking 24 hours prior to test. 4. Your  medication may be taken with water.  If your doctor stopped a medication because of this test, do not take that medication. 5. Ladies, please do not wear dresses.  Skirts or pants are appropriate. Please wear a short sleeve shirt. 6. No perfume, cologne or lotion. 7. Wear comfortable walking shoes. No heels!   Follow-Up: Your physician recommends that you schedule a follow-up appointment as needed with Dr. Yvone Neu. We will call you with results   It was a pleasure seeing you today here in the office. Please do not hesitate to give [place] a call back if you have any further questions. [place], BSN      Echocardiogram An echocardiogram, or echocardiography, uses sound waves (ultrasound) to produce an image of your heart. The echocardiogram is simple, painless, obtained within a short period of time, and offers valuable information to your health care provider. The images from an echocardiogram can provide information such as:  Evidence of coronary artery disease (CAD).  Heart size.  Heart muscle function.  Heart valve function.  Aneurysm detection.  Evidence of a past heart attack.  Fluid buildup around the heart.  Heart muscle thickening.  Assess heart valve function. LET [facility] CARE PROVIDER KNOW ABOUT:  Any allergies you have.  All medicines you are taking, including vitamins, herbs, eye drops, creams, and over-the-counter medicines.  Previous problems you or members of your family have had with the use of anesthetics.  Any blood disorders you have.  Previous surgeries you have had.  Medical conditions you  have.  Possibility of pregnancy, if this applies. BEFORE THE PROCEDURE  No special preparation is needed. Eat and drink normally.  PROCEDURE   In order to produce an image of your heart, gel will be applied to your chest and a wand-like tool (transducer) will be moved over your chest. The gel will help transmit the sound waves from the  transducer. The sound waves will harmlessly bounce off your heart to allow the heart images to be captured in real-time motion. These images will then be recorded.  You may need an IV to receive a medicine that improves the quality of the pictures. AFTER THE PROCEDURE You may return to your normal schedule including diet, activities, and medicines, unless your health care provider tells you otherwise.   This information is not intended to replace advice given to you by your health care provider. Make sure you discuss any questions you have with your health care provider.   Document Released: 08/23/2000 Document Revised: 09/16/2014 Document Reviewed: 05/03/2013 Elsevier Interactive Patient Education 2016 [place]. Pharmacologic Stress Electrocardiogram A pharmacologic stress electrocardiogram is a heart (cardiac) test that uses nuclear imaging to evaluate the blood supply to your heart. This test may also be called a pharmacologic stress electrocardiography. Pharmacologic means that a medicine is used to increase your heart rate and blood pressure.  This stress test is done to find areas of poor blood flow to the heart by determining the extent of coronary artery disease (CAD). Some people exercise on a treadmill, which naturally increases the blood flow to the heart. For those people unable to exercise on a treadmill, a medicine is used. This medicine stimulates your heart and will cause your heart to beat harder and more quickly, as if you were exercising.  Pharmacologic stress tests can help determine:  The adequacy of blood flow to your heart during increased levels of activity in order to clear you for discharge home.  The extent of coronary artery blockage caused by CAD.  Your prognosis if you have suffered a heart attack.  The effectiveness of cardiac procedures done, such as an angioplasty, which can increase the circulation in your coronary arteries.  Causes of chest pain or  pressure. LET [facility] CARE PROVIDER KNOW ABOUT:  Any allergies you have.  All medicines you are taking, including vitamins, herbs, eye drops, creams, and over-the-counter medicines.  Previous problems you or members of your family have had with the use of anesthetics.  Any blood disorders you have.  Previous surgeries you have had.  Medical conditions you have.  Possibility of pregnancy, if this applies.  If you are currently breastfeeding. RISKS AND COMPLICATIONS Generally, this is a safe procedure. However, as with any procedure, complications can occur. Possible complications include:  You develop pain or pressure in the following areas:  Chest.  Jaw or neck.  Between your shoulder blades.  Radiating down your left arm.  Headache.  Dizziness or light-headedness.  Shortness of breath.  Increased or irregular heartbeat.  Low blood pressure.  Nausea or vomiting.  Flushing.  Redness going up the arm and slight pain during injection of medicine.  Heart attack (rare). BEFORE THE PROCEDURE   Avoid all forms of caffeine for 24 hours before your test or as directed by your health care provider. This includes coffee, tea (even decaffeinated tea), caffeinated sodas, chocolate, cocoa, and certain pain medicines.  Follow your health care provider's instructions regarding eating and drinking before the test.  Take your medicines as directed  at regular times with water unless instructed otherwise. Exceptions may include:  If you have diabetes, ask how you are to take your insulin or pills. It is common to adjust insulin dosing the morning of the test.  If you are taking beta-blocker medicines, it is important to talk to your health care provider about these medicines well before the date of your test. Taking beta-blocker medicines may interfere with the test. In some cases, these medicines need to be changed or stopped 24 hours or more before the test.  If you wear  a nitroglycerin patch, it may need to be removed prior to the test. Ask your health care provider if the patch should be removed before the test.  If you use an inhaler for any breathing condition, bring it with you to the test.  If you are an outpatient, bring a snack so you can eat right after the stress phase of the test.  Do not smoke for 4 hours prior to the test or as directed by your health care provider.  Do not apply lotions, powders, creams, or oils on your chest prior to the test.  Wear comfortable shoes and clothing. Let your health care provider know if you were unable to complete or follow the preparations for your test. PROCEDURE   Multiple patches (electrodes) will be put on your chest. If needed, small areas of your chest may be shaved to get better contact with the electrodes. Once the electrodes are attached to your body, multiple wires will be attached to the electrodes, and your heart rate will be monitored.  An IV access will be started. A nuclear trace (isotope) is given. The isotope may be given intravenously, or it may be swallowed. Nuclear refers to several types of radioactive isotopes, and the nuclear isotope lights up the arteries so that the nuclear images are clear. The isotope is absorbed by your body. This results in low radiation exposure.  A resting nuclear image is taken to show how your heart functions at rest.  A medicine is given through the IV access.  A second scan is done about 1 hour after the medicine injection and determines how your heart functions under stress.  During this stress phase, you will be connected to an electrocardiogram machine. Your blood pressure and oxygen levels will be monitored. AFTER THE PROCEDURE   Your heart rate and blood pressure will be monitored after the test.  You may return to your normal schedule, including diet,activities, and medicines, unless your health care provider tells you otherwise.   This  information is not intended to replace advice given to you by your health care provider. Make sure you discuss any questions you have with your health care provider.   Document Released: 01/12/2009 Document Revised: 08/31/2013 Document Reviewed: 05/03/2013 Elsevier Interactive Patient Education [employer].

## 2016-06-27 ENCOUNTER — Ambulatory Visit
Admission: RE | Admit: 2016-06-27 | Discharge: 2016-06-27 | Disposition: A | Discharge status: Home / Self Care | Payer: Commercial Managed Care - HMO | Source: Ambulatory Visit | Attending: Family Medicine | Admitting: Family Medicine

## 2016-06-27 ENCOUNTER — Other Ambulatory Visit: Payer: Self-pay | Admitting: Family Medicine

## 2016-06-27 DIAGNOSIS — N649 Disorder of breast, unspecified: Secondary | ICD-10-CM

## 2016-06-27 DIAGNOSIS — N6320 Unspecified lump in the left breast, unspecified quadrant: Secondary | ICD-10-CM | POA: Diagnosis not present

## 2016-07-01 ENCOUNTER — Telehealth: Payer: Self-pay

## 2016-07-01 NOTE — Telephone Encounter (Signed)
Spoke w/ pt.  Reviewed ETT Myoview instructions.  She verbalizes understanding and will arrive @ the [facility] tomorrow @ 7:45 am.

## 2016-07-02 ENCOUNTER — Encounter
Admission: RE | Admit: 2016-07-02 | Discharge: 2016-07-02 | Disposition: A | Discharge status: Home / Self Care | Payer: Commercial Managed Care - HMO | Source: Ambulatory Visit | Attending: Cardiology | Admitting: Cardiology

## 2016-07-02 DIAGNOSIS — R0602 Shortness of breath: Secondary | ICD-10-CM

## 2016-07-02 DIAGNOSIS — R079 Chest pain, unspecified: Secondary | ICD-10-CM

## 2016-07-02 LAB — NM MYOCAR MULTI W/SPECT W/WALL MOTION / EF
CHL CUP NUCLEAR SDS: 0
CHL CUP NUCLEAR SSS: 0
LV dias vol: 65 mL (ref 46–106)
LV sys vol: 19 mL
NUC STRESS TID: 0.98
Peak HR: 98 {beats}/min
Percent HR: 65 %
Rest HR: 57 {beats}/min
SRS: 3

## 2016-07-02 MED ORDER — REGADENOSON 0.4 MG/5ML IV SOLN
0.4000 mg | Freq: Once | INTRAVENOUS | Status: AC
  Administered 2016-07-02: 0.4 mg via INTRAVENOUS

## 2016-07-02 MED ORDER — TECHNETIUM TC 99M TETROFOSMIN IV KIT
13.0000 | PACK | Freq: Once | INTRAVENOUS | Status: AC | PRN
  Administered 2016-07-02: 12.65 via INTRAVENOUS

## 2016-07-02 MED ORDER — TECHNETIUM TC 99M TETROFOSMIN IV KIT
31.9300 | PACK | Freq: Once | INTRAVENOUS | Status: AC | PRN
  Administered 2016-07-02: 31.93 via INTRAVENOUS

## 2016-07-15 ENCOUNTER — Ambulatory Visit (INDEPENDENT_AMBULATORY_CARE_PROVIDER_SITE_OTHER): Payer: Commercial Managed Care - HMO

## 2016-07-15 ENCOUNTER — Other Ambulatory Visit: Payer: Self-pay

## 2016-07-15 DIAGNOSIS — R0602 Shortness of breath: Secondary | ICD-10-CM

## 2016-07-15 DIAGNOSIS — R079 Chest pain, unspecified: Secondary | ICD-10-CM | POA: Diagnosis not present

## 2016-07-15 LAB — ECHOCARDIOGRAM COMPLETE
CHL CUP DOP CALC LVOT VTI: 24.1 cm
CHL CUP STROKE VOLUME: 37 mL
E/e' ratio: 12.37
EWDT: 166 ms
FS: 50 % — AB (ref 28–44)
IVS/LV PW RATIO, ED: 0.89
LA diam end sys: 32 mm
LA diam index: 1.64 cm/m2
LA vol: 56.6 mL
LASIZE: 32 mm
LAVOLA4C: 50.6 mL
LAVOLIN: 29 mL/m2
LDCA: 2.54 cm2
LV E/e' medial: 12.37
LV E/e'average: 12.37
LV PW d: 8.89 mm — AB (ref 0.6–1.1)
LV SIMPSON'S DISK: 65
LV dias vol: 56 mL (ref 46–106)
LV e' LATERAL: 8.81 cm/s
LVDIAVOLIN: 29 mL/m2
LVOT SV: 61 mL
LVOTD: 18 mm
LVOTPV: 106 cm/s
LVSYSVOL: 19 mL (ref 14–42)
LVSYSVOLIN: 10 mL/m2
MV Dec: 166
MV Peak grad: 5 mmHg
MV pk A vel: 88.6 m/s
MV pk E vel: 109 m/s
PV Reg vel dias: 77.4 cm/s
RV TAPSE: 22.6 mm
TDI e' lateral: 8.81
TDI e' medial: 9.9

## 2016-08-23 ENCOUNTER — Telehealth: Payer: Self-pay | Admitting: Family Medicine

## 2016-08-23 NOTE — Telephone Encounter (Signed)
Pt called and stated that she has picked up the stomach bug that has been going around. Pt has been vomiting since yesterday afternoon. Offered pt appt for this afternoon and stated that she cannot even make it out of the house. Pt would like something called in. Please advise, thank you!  Call pt @ [phone]  Pharmacy - [id number] - [place], [address]

## 2016-08-23 NOTE — Telephone Encounter (Signed)
Patient states she thinks she has picked up stomach bug, vomiting,chills , stomach pains, nausea since yesterday.   She has tried OTC Dramamine .  Would like something called in for nausea.   No appointments available.  Please advise.

## 2016-08-23 NOTE — Telephone Encounter (Signed)
Patient advised and verbalized an understanding  

## 2016-08-23 NOTE — Telephone Encounter (Signed)
Unfortunately with the complaint of stomach pain she will need to be evaluated to receive medications for this. I would suggest the walk-in clinic at [facility] or an urgent care today. Thanks.

## 2016-08-29 ENCOUNTER — Other Ambulatory Visit: Payer: Self-pay | Admitting: Family Medicine

## 2016-08-29 ENCOUNTER — Ambulatory Visit: Payer: Commercial Managed Care - HMO | Admitting: Family Medicine

## 2016-08-29 ENCOUNTER — Other Ambulatory Visit (INDEPENDENT_AMBULATORY_CARE_PROVIDER_SITE_OTHER): Payer: Commercial Managed Care - HMO

## 2016-08-29 DIAGNOSIS — R350 Frequency of micturition: Secondary | ICD-10-CM | POA: Diagnosis not present

## 2016-08-29 LAB — POCT URINALYSIS DIPSTICK
BILIRUBIN UA: NEGATIVE
GLUCOSE UA: NEGATIVE
KETONES UA: NEGATIVE
NITRITE UA: NEGATIVE
PH UA: 6
Spec Grav, UA: >=1.03
Urobilinogen, UA: NEGATIVE

## 2016-08-29 MED ORDER — CEPHALEXIN 500 MG PO CAPS: 500.0000 mg | ORAL_CAPSULE | Freq: Two times a day (BID) | ORAL | 0 refills | Status: DC

## 2016-08-30 ENCOUNTER — Ambulatory Visit: Payer: Commercial Managed Care - HMO | Admitting: Family Medicine

## 2016-08-31 LAB — URINE CULTURE

## 2016-09-04 ENCOUNTER — Other Ambulatory Visit: Payer: Self-pay | Admitting: Family Medicine

## 2016-09-04 MED ORDER — AMOXICILLIN-POT CLAVULANATE 500-125 MG PO TABS: 1.0000 | ORAL_TABLET | Freq: Two times a day (BID) | ORAL | 0 refills | Status: DC

## 2016-10-10 DIAGNOSIS — K006 Disturbances in tooth eruption: Secondary | ICD-10-CM | POA: Diagnosis not present

## 2016-10-17 ENCOUNTER — Ambulatory Visit (INDEPENDENT_AMBULATORY_CARE_PROVIDER_SITE_OTHER): Payer: Medicare HMO

## 2016-10-17 VITALS — BP 140/70 | HR 69 | Resp 12 | Ht 67.5 in | Wt 184.0 lb

## 2016-10-17 DIAGNOSIS — Z0000 Encounter for general adult medical examination without abnormal findings: Secondary | ICD-10-CM | POA: Diagnosis not present

## 2016-10-17 NOTE — Patient Instructions (Addendum)
[name] , Thank you for taking time to come for your Medicare Wellness Visit. I appreciate your ongoing commitment to your health goals. Please review the following plan we discussed and let me know if I can assist you in the future.    Follow up with Dr. Caryl Bis as needed.  These are the goals we discussed: Goals    . Healthy Lifestyle          Stay active and continue exercises Stay hydrated  Low carb foods.  Educational material provided       This is a list of the screening recommended for you and due dates:  Health Maintenance  Topic Date Due  . Tetanus Vaccine  03/23/1964  . Mammogram  06/27/2018  . Colon Cancer Screening  01/30/2026  . Flu Shot  Completed  . DEXA scan (bone density measurement)  Completed  . Shingles Vaccine  Completed  .  Hepatitis C: One time screening is recommended by Center for Disease Control  (CDC) for  adults born from [birth year] through 1966.   Completed  . Pneumonia vaccines  Completed      Fall Prevention in the Home Introduction Falls can cause injuries. They can happen to people of all ages. There are many things you can do to make your home safe and to help prevent falls. What can I do on the outside of my home?  Regularly fix the edges of walkways and driveways and fix any cracks.  Remove anything that might make you trip as you walk through a door, such as a raised step or threshold.  Trim any bushes or trees on the path to your home.  Use bright outdoor lighting.  Clear any walking paths of anything that might make someone trip, such as rocks or tools.  Regularly check to see if handrails are loose or broken. Make sure that both sides of any steps have handrails.  Any raised decks and porches should have guardrails on the edges.  Have any leaves, snow, or ice cleared regularly.  Use sand or salt on walking paths during winter.  Clean up any spills in your garage right away. This includes oil or grease spills. What can I do  in the bathroom?  Use night lights.  Install grab bars by the toilet and in the tub and shower. Do not use towel bars as grab bars.  Use non-skid mats or decals in the tub or shower.  If you need to sit down in the shower, use a plastic, non-slip stool.  Keep the floor dry. Clean up any water that spills on the floor as soon as it happens.  Remove soap buildup in the tub or shower regularly.  Attach bath mats securely with double-sided non-slip rug tape.  Do not have throw rugs and other things on the floor that can make you trip. What can I do in the bedroom?  Use night lights.  Make sure that you have a light by your bed that is easy to reach.  Do not use any sheets or blankets that are too big for your bed. They should not hang down onto the floor.  Have a firm chair that has side arms. You can use this for support while you get dressed.  Do not have throw rugs and other things on the floor that can make you trip. What can I do in the kitchen?  Clean up any spills right away.  Avoid walking on wet floors.  Keep items  that you use a lot in easy-to-reach places.  If you need to reach something above you, use a strong step stool that has a grab bar.  Keep electrical cords out of the way.  Do not use floor polish or wax that makes floors slippery. If you must use wax, use non-skid floor wax.  Do not have throw rugs and other things on the floor that can make you trip. What can I do with my stairs?  Do not leave any items on the stairs.  Make sure that there are handrails on both sides of the stairs and use them. Fix handrails that are broken or loose. Make sure that handrails are as long as the stairways.  Check any carpeting to make sure that it is firmly attached to the stairs. Fix any carpet that is loose or worn.  Avoid having throw rugs at the top or bottom of the stairs. If you do have throw rugs, attach them to the floor with carpet tape.  Make sure that you  have a light switch at the top of the stairs and the bottom of the stairs. If you do not have them, ask someone to add them for you. What else can I do to help prevent falls?  Wear shoes that:  Do not have high heels.  Have rubber bottoms.  Are comfortable and fit you well.  Are closed at the toe. Do not wear sandals.  If you use a stepladder:  Make sure that it is fully opened. Do not climb a closed stepladder.  Make sure that both sides of the stepladder are locked into place.  Ask someone to hold it for you, if possible.  Clearly mark and make sure that you can see:  Any grab bars or handrails.  First and last steps.  Where the edge of each step is.  Use tools that help you move around (mobility aids) if they are needed. These include:  Canes.  Walkers.  Scooters.  Crutches.  Turn on the lights when you go into a dark area. Replace any light bulbs as soon as they burn out.  Set up your furniture so you have a clear path. Avoid moving your furniture around.  If any of your floors are uneven, fix them.  If there are any pets around you, be aware of where they are.  Review your medicines with your doctor. Some medicines can make you feel dizzy. This can increase your chance of falling. Ask your doctor what other things that you can do to help prevent falls. This information is not intended to replace advice given to you by your health care provider. Make sure you discuss any questions you have with your health care provider. Document Released: 06/22/2009 Document Revised: 02/01/2016 Document Reviewed: 09/30/2014  2017 Elsevier

## 2016-10-17 NOTE — Progress Notes (Signed)
Subjective:   Monica Larson is a 71 y.o. female who presents for Medicare Annual (Subsequent) preventive examination.  Review of Systems:  No ROS.  Medicare Wellness Visit.  Cardiac Risk Factors include: advanced age (>2men, >5 women);hypertension     Objective:     Vitals: BP 140/70 (BP Location: Left Arm, Patient Position: Sitting, Cuff Size: Normal)   Pulse 69   Resp 12   Ht 5' 7.5" (1.715 m)   Wt 184 lb (83.5 kg)   SpO2 96%   BMI 28.39 kg/m   Body mass index is 28.39 kg/m.   Tobacco History  Smoking Status  . Never Smoker  Smokeless Tobacco  . Never Used    Comment: 1 cig every 3 months      Counseling given: Not Answered   Past Medical History:  Diagnosis Date  . Arthritis    HANDS/NECK/SHOULDERS  . Asthma    SEASONAL  . Diarrhea    INTERMITTENT CHRONIC  . Fatigue   . GERD (gastroesophageal reflux disease)   . Headache    SINUS/ ALLERGIES  . HLD (hyperlipidemia)    borderline  . HTN (hypertension)    CONTROLLED ON MEDS  . Motion sickness   . Seasonal allergies   . Shortness of breath dyspnea    ON EXERTION   Past Surgical History:  Procedure Laterality Date  . BREAST BIOPSY Bilateral    NEG  . BREAST EXCISIONAL BIOPSY Bilateral    NEG  . CARPAL TUNNEL RELEASE     right wrist  . CHOLECYSTECTOMY    . COLONOSCOPY N/A 01/31/2016   Procedure: COLONOSCOPY;  Surgeon: Hulen Luster, MD;  Location: [facility];  Service: Gastroenterology;  Laterality: N/A;  . EYE SURGERY     muscular as child/ CATARACT BILATERAL  . SKIN CANCER EXCISION Right Wrist and thigh (x2)   Unsure of dates and Dermatologist  . VESICOVAGINAL FISTULA CLOSURE W/ TAH     Family History  Problem Relation Age of Onset  . Ovarian cancer Mother   . Prostate cancer Father   . Coronary artery disease Father   . Diabetes Father   . Heart attack Brother   . Breast cancer Neg Hx    History  Sexual Activity  . Sexual activity: Not on file    Outpatient Encounter  Prescriptions as of 10/17/2016  Medication Sig  . aspirin (ASPIR-LOW) 81 MG EC tablet Take 81 mg by mouth daily.   . diphenhydrAMINE (BENADRYL) 25 MG tablet Take 25 mg by mouth daily as needed for itching, allergies or sleep.  . Glucos-Chond-Hyal Ac-Ca Fructo (MOVE FREE JOINT HEALTH ADVANCE PO) Take 1 tablet by mouth 2 (two) times daily. Reported on 02/12/2016  . lisinopril (PRINIVIL,ZESTRIL) 30 MG tablet Take 1 tablet (30 mg total) by mouth daily.  . Multiple Vitamins-Minerals (MULTIVITAMIN WITH MINERALS) tablet Take 1 tablet by mouth daily.   Marland Kitchen omeprazole (PRILOSEC) 20 MG capsule Take 20 mg by mouth daily as needed. For acid reflux  . oxybutynin (DITROPAN) 5 MG tablet Take 2 tablets (10 mg total) by mouth daily.  . traZODone (DESYREL) 150 MG tablet Take 1 tablet (150 mg total) by mouth at bedtime.  . vitamin B-12 (CYANOCOBALAMIN) 1000 MCG tablet Take 1,000 mcg by mouth daily.   . cyclobenzaprine (FLEXERIL) 5 MG tablet Take 1 tablet (5 mg total) by mouth 3 (three) times daily as needed for muscle spasms. (Patient not taking: Reported on 10/17/2016)  . [DISCONTINUED] amoxicillin-clavulanate (AUGMENTIN) 500-125 MG  tablet Take 1 tablet (500 mg total) by mouth 2 (two) times daily.   No facility-administered encounter medications on file as of 10/17/2016.     Activities of Daily Living In your present state of health, do you have any difficulty performing the following activities: 10/17/2016 01/31/2016  Hearing? N N  Vision? N N  Difficulty concentrating or making decisions? Y N  Walking or climbing stairs? N N  Dressing or bathing? N N  Doing errands, shopping? N -  Preparing Food and eating ? N -  Using the Toilet? N -  In the past six months, have you accidently leaked urine? Y -  Do you have problems with loss of bowel control? Y -  Managing your Medications? N -  Managing your Finances? N -  Housekeeping or managing your Housekeeping? N -  Some recent data might be hidden    Patient Care  Team: Leone Haven, MD as PCP - General (Family Medicine)    Assessment:    This is a routine wellness examination for Golden. The goal of the wellness visit is to assist the patient how to close the gaps in care and create a preventative care plan for the patient.   Osteoporosis risk reviewed.  Medications reviewed; taking without issues or barriers.  Safety issues reviewed; smoke detectors in the home. Firearms locked in a safe within the home.  Wears seatbelts when driving or riding with others. No violence in the home.  No identified risk were noted; The patient was oriented x 3; appropriate in dress and manner and no objective failures at ADL's or IADL's.   BMI; discussed the importance of a healthy diet, water intake and exercise. Educational material provided.  HTN; followed by PCP.  TDAP vaccine deferred for follow up with insurance.  Educational material provided.  Patient Concerns: None at this time. Follow up with PCP as needed.  Exercise Activities and Dietary recommendations Current Exercise Habits: Structured exercise class, Type of exercise: treadmill;calisthenics;strength training/weights, Time (Minutes): 60, Frequency (Times/Week): 2, Weekly Exercise (Minutes/Week): 120, Intensity: Moderate  Goals    . Healthy Lifestyle          Stay active and continue exercises Stay hydrated  Low carb foods.  Educational material provided      Fall Risk Fall Risk  10/17/2016 02/12/2016 11/07/2014  Falls in the past year? Yes Yes No  Number falls in past yr: 1 2 or more -  Injury with Fall? No No -  Risk Factor Category  - High Fall Risk -  Risk for fall due to : History of fall(s) - -  Follow up Falls prevention discussed - -   Depression Screen PHQ 2/9 Scores 10/17/2016 02/12/2016 11/07/2014  PHQ - 2 Score 0 0 0     Cognitive Function     6CIT Screen 10/17/2016  What Year? 0 points  What month? 0 points  What time? 0 points  Count back from 20 0 points    Months in reverse 0 points  Repeat phrase 0 points  Total Score 0    Immunization History  Administered Date(s) Administered  . Influenza Split 06/03/2011  . Influenza, High Dose Seasonal PF 06/11/2016  . Influenza-Unspecified 06/19/2013, 07/06/2014  . Pneumococcal Conjugate-13 11/07/2014  . Pneumococcal Polysaccharide-23 07/20/2012  . Zoster 04/09/2013   Screening Tests Health Maintenance  Topic Date Due  . TETANUS/TDAP  03/23/1964  . MAMMOGRAM  06/27/2018  . COLONOSCOPY  01/30/2026  . INFLUENZA VACCINE  Completed  .  DEXA SCAN  Completed  . ZOSTAVAX  Completed  . Hepatitis C Screening  Completed  . PNA vac Low Risk Adult  Completed      Plan:    End of life planning; Advance aging; Advanced directives discussed. Copy of current HCPOA/Living Will requested.  Medicare Attestation I have personally reviewed: The patient's medical and social history Their use of alcohol, tobacco or illicit drugs Their current medications and supplements The patient's functional ability including ADLs,fall risks, home safety risks, cognitive, and hearing and visual impairment Diet and physical activities Evidence for depression   The patient's weight, height, BMI, and visual acuity have been recorded in the chart.  I have made referrals and provided education to the patient based on review of the above and I have provided the patient with a written personalized care plan for preventive services.    During the course of the visit the patient was educated and counseled about the following appropriate screening and preventive services:   Vaccines to include Pneumoccal, Influenza, Hepatitis B, Td, Zostavax, HCV  Electrocardiogram  Cardiovascular Disease  Colorectal cancer screening  Bone density screening  Diabetes screening  Glaucoma screening  Mammography/PAP  Nutrition counseling   Patient Instructions (the written plan) was given to the patient.   Varney Biles, LPN  [phone]

## 2016-10-18 NOTE — Progress Notes (Signed)
Care was provided under my supervision. I agree with the management as indicated in the note.  Tamie Minteer DO  

## 2016-11-15 DIAGNOSIS — C44519 Basal cell carcinoma of skin of other part of trunk: Secondary | ICD-10-CM | POA: Diagnosis not present

## 2016-11-15 DIAGNOSIS — L239 Allergic contact dermatitis, unspecified cause: Secondary | ICD-10-CM | POA: Diagnosis not present

## 2016-11-15 DIAGNOSIS — D225 Melanocytic nevi of trunk: Secondary | ICD-10-CM | POA: Diagnosis not present

## 2016-11-15 DIAGNOSIS — D485 Neoplasm of uncertain behavior of skin: Secondary | ICD-10-CM | POA: Diagnosis not present

## 2016-11-15 DIAGNOSIS — D2261 Melanocytic nevi of right upper limb, including shoulder: Secondary | ICD-10-CM | POA: Diagnosis not present

## 2016-11-15 DIAGNOSIS — Z85828 Personal history of other malignant neoplasm of skin: Secondary | ICD-10-CM | POA: Diagnosis not present

## 2016-11-19 DIAGNOSIS — N393 Stress incontinence (female) (male): Secondary | ICD-10-CM | POA: Diagnosis not present

## 2016-11-19 DIAGNOSIS — R339 Retention of urine, unspecified: Secondary | ICD-10-CM | POA: Diagnosis not present

## 2016-11-19 DIAGNOSIS — R102 Pelvic and perineal pain: Secondary | ICD-10-CM | POA: Diagnosis not present

## 2016-11-19 DIAGNOSIS — N3941 Urge incontinence: Secondary | ICD-10-CM | POA: Diagnosis not present

## 2016-12-19 DIAGNOSIS — N393 Stress incontinence (female) (male): Secondary | ICD-10-CM | POA: Diagnosis not present

## 2016-12-19 DIAGNOSIS — R102 Pelvic and perineal pain: Secondary | ICD-10-CM | POA: Diagnosis not present

## 2016-12-19 DIAGNOSIS — N358 Other urethral stricture: Secondary | ICD-10-CM | POA: Diagnosis not present

## 2016-12-31 DIAGNOSIS — C44519 Basal cell carcinoma of skin of other part of trunk: Secondary | ICD-10-CM | POA: Diagnosis not present

## 2017-01-10 ENCOUNTER — Telehealth: Payer: Self-pay | Admitting: Radiology

## 2017-01-10 ENCOUNTER — Encounter: Payer: Self-pay | Admitting: Family Medicine

## 2017-01-10 ENCOUNTER — Ambulatory Visit (INDEPENDENT_AMBULATORY_CARE_PROVIDER_SITE_OTHER): Payer: Commercial Managed Care - HMO

## 2017-01-10 ENCOUNTER — Ambulatory Visit (INDEPENDENT_AMBULATORY_CARE_PROVIDER_SITE_OTHER): Payer: Medicare HMO | Admitting: Family Medicine

## 2017-01-10 VITALS — BP 158/80 | HR 66 | Temp 98.2°F | Wt 183.2 lb

## 2017-01-10 DIAGNOSIS — G8929 Other chronic pain: Secondary | ICD-10-CM

## 2017-01-10 DIAGNOSIS — R252 Cramp and spasm: Secondary | ICD-10-CM

## 2017-01-10 DIAGNOSIS — G5701 Lesion of sciatic nerve, right lower limb: Secondary | ICD-10-CM | POA: Diagnosis not present

## 2017-01-10 DIAGNOSIS — I10 Essential (primary) hypertension: Secondary | ICD-10-CM

## 2017-01-10 DIAGNOSIS — R32 Unspecified urinary incontinence: Secondary | ICD-10-CM

## 2017-01-10 DIAGNOSIS — M25561 Pain in right knee: Secondary | ICD-10-CM | POA: Diagnosis not present

## 2017-01-10 DIAGNOSIS — M1711 Unilateral primary osteoarthritis, right knee: Secondary | ICD-10-CM | POA: Diagnosis not present

## 2017-01-10 LAB — BASIC METABOLIC PANEL
BUN: 17 mg/dL (ref 6–23)
CHLORIDE: 104 meq/L (ref 96–112)
CO2: 28 meq/L (ref 19–32)
Calcium: 9.9 mg/dL (ref 8.4–10.5)
Creatinine, Ser: 0.87 mg/dL (ref 0.40–1.20)
GFR: 68.06 mL/min (ref >60.00)
GLUCOSE: 107 mg/dL — AB (ref 70–99)
POTASSIUM: 4.2 meq/L (ref 3.5–5.1)
SODIUM: 140 meq/L (ref 135–145)

## 2017-01-10 NOTE — Patient Instructions (Addendum)
Nice to see you. Please keep your follow-up with urology. We'll x-ray your right knee. Please do the exercises as outlined below.  We'll obtain lab work and contact you with the results. If you develop numbness, weakness, change in bowel or bladder function, numbness between her legs, or any new or changing symptoms please seek medical attention medially.   Piriformis Syndrome Rehab Ask your health care provider which exercises are safe for you. Do exercises exactly as told by your health care provider and adjust them as directed. It is normal to feel mild stretching, pulling, tightness, or discomfort as you do these exercises, but you should stop right away if you feel sudden pain or your pain gets worse.Do not begin these exercises until told by your health care provider. Stretching and range of motion exercises These exercises warm up your muscles and joints and improve the movement and flexibility of your hip and pelvis. These exercises also help to relieve pain, numbness, and tingling. Exercise A: Hip rotators   1. Lie on your back on a firm surface. 2. Pull your left / right knee toward your same shoulder with your left / right hand until your knee is pointing toward the ceiling. Hold your left / right ankle with your other hand. 3. Keeping your knee steady, gently pull your left / right ankle toward your other shoulder until you feel a stretch in your buttocks. 4. Hold this position for __________ seconds. Repeat __________ times. Complete this stretch __________ times a day. Exercise B: Hip extensors  1. Lie on your back on a firm surface. Both of your legs should be straight. 2. Pull your left / right knee to your chest. Hold your leg in this position by holding onto the back of your thigh or the front of your knee. 3. Hold this position for __________ seconds. 4. Slowly return to the starting position. Repeat __________ times. Complete this stretch __________ times a  day. Strengthening exercises These exercises build strength and endurance in your hip and thigh muscles. Endurance is the ability to use your muscles for a long time, even after they get tired. Exercise C: Straight leg raises (  hip abductors) 1. Lie on your side with your left / right leg in the top position. Lie so your head, shoulder, knee, and hip line up. Bend your bottom knee to help you balance. 2. Lift your top leg up 4-6 inches (10-15 cm), keeping your toes pointed straight ahead. 3. Hold this position for __________ seconds. 4. Slowly lower your leg to the starting position. Let your muscles relax completely. Repeat __________ times. Complete this exercise__________ times a day. Exercise D: Hip abductors and rotators, quadruped  1. Get on your hands and knees on a firm, lightly padded surface. Your hands should be directly below your shoulders, and your knees should be directly below your hips. 2. Lift your left / right knee out to the side. Keep your knee bent. Do not twist your body. 3. Hold this position for __________ seconds. 4. Slowly lower your leg. Repeat __________ times. Complete this exercise__________ times a day. Exercise E: Straight leg raises ( hip extensors) 1. Lie on your abdomen on a bed or a firm surface with a pillow under your hips. 2. Squeeze your buttock muscles and lift your left / right thigh off the bed. Do not let your back arch. 3. Hold this position for __________ seconds. 4. Slowly return to the starting position. Let your muscles relax completely before  doing another repetition. Repeat __________ times. Complete this exercise__________ times a day. This information is not intended to replace advice given to you by your health care provider. Make sure you discuss any questions you have with your health care provider. Document Released: 08/26/2005 Document Revised: 04/30/2016 Document Reviewed: 08/08/2015 Elsevier Interactive Patient Education  2017  [employer].

## 2017-01-10 NOTE — Progress Notes (Signed)
Pre visit review using our clinic review tool, if applicable. No additional management support is needed unless otherwise documented below in the visit note. 

## 2017-01-10 NOTE — Assessment & Plan Note (Signed)
Suspect piriformis syndrome as cause of her discomfort. Discomfort is isolated in her buttocks. No back pain. Tender in the typical area for this issue. We will have her do exercises for this. Offered muscle relaxer though when she took Flexeril previously was not beneficial. She's been taking ibuprofen. She'll monitor and if not improving could consider physical therapy or referral to sports medicine or orthopedics. Given return precautions.

## 2017-01-10 NOTE — Assessment & Plan Note (Signed)
Chronic issue. Somewhat worsened recently. Has given out on her on a couple of occasions. Suspect meniscal issue. We'll obtain an x-ray and then potentially refer to orthopedics.

## 2017-01-10 NOTE — Assessment & Plan Note (Signed)
Has been somewhat elevated recently. She's not checking at home. She'll start checking and follow-up in 2 weeks with me to determine if we need to increase medication.

## 2017-01-10 NOTE — Assessment & Plan Note (Signed)
Chronic issue. Followed by urology. Found to have urethral stricture recently. Has been doing catheterizations to help keep this patient. Doubt this is related to her buttocks discomfort given the chronicity of the urinary issues. She did not have any saddle anesthesia and did have intact sensation on testing this area. She'll continue to follow with urology for this.

## 2017-01-10 NOTE — Assessment & Plan Note (Signed)
Has had chronic issues with this. Continues to have issues with this. We'll check a BMP.

## 2017-01-10 NOTE — Telephone Encounter (Signed)
NA

## 2017-01-10 NOTE — Progress Notes (Signed)
Monica Rumps, MD Phone: [phone]  Monica Larson is a 71 y.o. female who presents today for same-day visit.  Patient notes several issues today. Notes her right knee has been bothering her for some time. Notes no injury. Thinks it may just be overuse. Notes it has given out on her on several occasions. Hurts over the lateral joint line. No radiation down her leg. She additionally notes right buttocks discomfort that feels as though it is a spasm. Comes and goes. It radiates down the back of her right leg. Notes her bilateral toes are numb in the morning though this resolves when she gets up and walks. She notes no weakness. No saddle anesthesia. She notes some chronic stress and urge incontinence for several years which she has seen the urologist for. Notes it has not worsened. She recently had a cystoscopy which revealed a urethral stricture which they tried to dilate and then were able to pass the scope. She does have a history of sling placement. She is intermittently catheterizing herself to help maintain the patency of the stricture. Her urinary symptoms have not changed recently. She's been taking ibuprofen with some benefit for her knee and buttocks.  PMH: nonsmoker.   ROS see history of present illness  Objective  Physical Exam Vitals:   01/10/17 1103 01/10/17 1141  BP: (!) 164/88 (!) 158/80  Pulse: 66   Temp: 98.2 F (36.8 C)     BP Readings from Last 3 Encounters:  01/10/17 (!) 158/80  10/17/16 140/70  06/26/16 140/70   Wt Readings from Last 3 Encounters:  01/10/17 183 lb 3.2 oz (83.1 kg)  10/17/16 184 lb (83.5 kg)  06/26/16 181 lb 6.4 oz (82.3 kg)    Physical Exam  Constitutional: No distress.  Cardiovascular: Normal rate, regular rhythm and normal heart sounds.   Pulmonary/Chest: Effort normal and breath sounds normal.  Musculoskeletal:  No midline spine tenderness, no midline spine step-off, no muscular back tenderness, there is slight tenderness in the  middle of her right buttocks, no overlying skin changes, no tenderness of her left buttocks, there is a bandage in an area that the patient reports she had a basal cell removed, there was no swelling in this area, there is no tenderness, there was no surrounding erythema, right knee with no swelling, warmth, or erythema, there is tenderness over the lateral joint line, no other tenderness, negative McMurray's, no ligamentous laxity, left knee with no swelling, warmth, erythema, or tenderness, no ligamentous laxity, negative McMurray's  Neurological: She is alert.  Patient able to stand up from chair with no issues, able to get on the exam table with no issues, light touch sensation intact lower extremities  Skin: Skin is warm and dry. She is not diaphoretic.     Assessment/Plan: Please see individual problem list.  Right knee pain Chronic issue. Somewhat worsened recently. Has given out on her on a couple of occasions. Suspect meniscal issue. We'll obtain an x-ray and then potentially refer to orthopedics.  Hypertension Has been somewhat elevated recently. She's not checking at home. She'll start checking and follow-up in 2 weeks with me to determine if we need to increase medication.  Leg cramps Has had chronic issues with this. Continues to have issues with this. We'll check a BMP.  Piriformis syndrome of right side Suspect piriformis syndrome as cause of her discomfort. Discomfort is isolated in her buttocks. No back pain. Tender in the typical area for this issue. We will have her do exercises  for this. Offered muscle relaxer though when she took Flexeril previously was not beneficial. She's been taking ibuprofen. She'll monitor and if not improving could consider physical therapy or referral to sports medicine or orthopedics. Given return precautions.  Urinary incontinence Chronic issue. Followed by urology. Found to have urethral stricture recently. Has been doing catheterizations to help  keep this patient. Doubt this is related to her buttocks discomfort given the chronicity of the urinary issues. She did not have any saddle anesthesia and did have intact sensation on testing this area. She'll continue to follow with urology for this.   Orders Placed This Encounter  Procedures  . DG Knee Complete 4 Views Right    Standing Status:   Future    Number of Occurrences:   1    Standing Expiration Date:   03/12/2018    Order Specific Question:   Reason for Exam (SYMPTOM  OR DIAGNOSIS REQUIRED)    Answer:   right knee pain    Order Specific Question:   Preferred imaging location?    Answer:   [facility] Specific Question:   Radiology Contrast Protocol - do NOT remove file path    Answer:   [email or web address]  . Basic metabolic panel   Monica Rumps, MD [place]

## 2017-01-16 ENCOUNTER — Telehealth: Payer: Self-pay | Admitting: Family Medicine

## 2017-01-16 NOTE — Telephone Encounter (Signed)
See BP readings below. I spoke with patient & reviewed her lab results again. She viewed her results on 01/10/17, but I went over them again for her.

## 2017-01-16 NOTE — Telephone Encounter (Signed)
Pt called in with some bp reading that she has been taking. They are as followed:  145/72  146/75  150/79  157/85  127/77  142/71  147/83  151/74 They were taken in the morning. Pt called requesting lab results. Please advise, thank you!  Call pt @ [phone]

## 2017-01-21 NOTE — Telephone Encounter (Signed)
BPs are all above goal. I would suggest addition of another medication for her blood pressure such as amlodipine. I can send this into her pharmacy if she is willing. Thanks.

## 2017-01-22 NOTE — Telephone Encounter (Signed)
Tried calling, no vm 

## 2017-01-24 MED ORDER — AMLODIPINE BESYLATE 5 MG PO TABS: 5.0000 mg | ORAL_TABLET | Freq: Every day | ORAL | 3 refills | Status: DC

## 2017-01-24 NOTE — Telephone Encounter (Signed)
Amlodipine sent to pharmacy. Patient should return in one month for recheck with nursing.

## 2017-01-24 NOTE — Telephone Encounter (Signed)
Patient notified and would like [facility] [place]

## 2017-01-24 NOTE — Telephone Encounter (Signed)
Left message to return call to notify patient to schedule nurse visit bp check

## 2017-02-04 ENCOUNTER — Other Ambulatory Visit: Payer: Self-pay | Admitting: *Deleted

## 2017-02-04 DIAGNOSIS — R339 Retention of urine, unspecified: Secondary | ICD-10-CM | POA: Diagnosis not present

## 2017-02-04 DIAGNOSIS — N3941 Urge incontinence: Secondary | ICD-10-CM | POA: Diagnosis not present

## 2017-02-04 DIAGNOSIS — R102 Pelvic and perineal pain: Secondary | ICD-10-CM | POA: Diagnosis not present

## 2017-02-04 DIAGNOSIS — N358 Other urethral stricture: Secondary | ICD-10-CM | POA: Diagnosis not present

## 2017-02-04 MED ORDER — OMEPRAZOLE 20 MG PO CPDR: 20.0000 mg | DELAYED_RELEASE_CAPSULE | Freq: Every day | ORAL | 1 refills | Status: DC | PRN

## 2017-02-04 NOTE — Telephone Encounter (Signed)
Filed under historical provider

## 2017-02-04 NOTE — Telephone Encounter (Signed)
I do not see where this has been prescribed by our office previously. Please see if she has been taking this. Thanks.

## 2017-02-04 NOTE — Telephone Encounter (Signed)
Patient requested a medication from omeprazole  Pharmacy [place]  Pt contact [phone]

## 2017-02-04 NOTE — Telephone Encounter (Signed)
Patient states she is taking omeprazole

## 2017-02-05 ENCOUNTER — Ambulatory Visit (INDEPENDENT_AMBULATORY_CARE_PROVIDER_SITE_OTHER): Payer: Medicare HMO | Admitting: *Deleted

## 2017-02-05 VITALS — BP 142/74 | HR 69 | Resp 16

## 2017-02-05 DIAGNOSIS — I10 Essential (primary) hypertension: Secondary | ICD-10-CM | POA: Diagnosis not present

## 2017-02-05 NOTE — Progress Notes (Signed)
Patient presented for BP re-check after adding amlodipine on 01/16/17. Left arm 140/78 pulse 67 Right arm 142/78 pulse 69 .

## 2017-02-06 NOTE — Progress Notes (Signed)
Left message to call office

## 2017-02-06 NOTE — Progress Notes (Signed)
BP still elevated. I would advise increasing amlodipine to 10 mg daily. Should have her blood pressure rechecked in a month. Thanks.  [name], M.D.

## 2017-02-12 NOTE — Progress Notes (Signed)
Left message for patient to return call to office. 

## 2017-02-17 NOTE — Progress Notes (Signed)
Patient notified and voiced understanding staed she would call back for nurse visit to re-check BP when she was able to view her schedule.

## 2017-02-25 ENCOUNTER — Telehealth: Payer: Self-pay | Admitting: Family Medicine

## 2017-02-25 NOTE — Telephone Encounter (Signed)
Pt aware that CD will be up front for pick up.

## 2017-02-25 NOTE — Telephone Encounter (Signed)
Pt is requesting a copy of her last xrays on a CD. Please advise

## 2017-02-26 DIAGNOSIS — M2391 Unspecified internal derangement of right knee: Secondary | ICD-10-CM | POA: Diagnosis not present

## 2017-02-26 DIAGNOSIS — M25561 Pain in right knee: Secondary | ICD-10-CM | POA: Diagnosis not present

## 2017-02-26 DIAGNOSIS — M1711 Unilateral primary osteoarthritis, right knee: Secondary | ICD-10-CM | POA: Diagnosis not present

## 2017-03-20 ENCOUNTER — Telehealth: Payer: Self-pay | Admitting: Family Medicine

## 2017-03-20 MED ORDER — AMLODIPINE BESYLATE 10 MG PO TABS: 10.0000 mg | ORAL_TABLET | Freq: Every day | ORAL | 3 refills | Status: DC

## 2017-03-20 NOTE — Telephone Encounter (Signed)
Pt is taking amLODipine (NORVASC) 5 MG tablet. She was told to take 2 pills a day. She is now out of her medication because the original prescription was for 1 pill a day. Pt is requesting a refill before going out of town. Please up to 10 mg so she only has to take 1 pill a day. [facility]acy [facility] [place].   Thanks

## 2017-03-20 NOTE — Telephone Encounter (Signed)
Please advise 

## 2017-03-20 NOTE — Telephone Encounter (Signed)
Sent to pharmacy 

## 2017-03-21 ENCOUNTER — Other Ambulatory Visit: Payer: Self-pay | Admitting: Family Medicine

## 2017-03-21 MED ORDER — LISINOPRIL 30 MG PO TABS: 30.0000 mg | ORAL_TABLET | Freq: Every day | ORAL | 3 refills | Status: DC

## 2017-03-21 NOTE — Telephone Encounter (Signed)
Pt called requesting a refill on her lisinopril (PRINIVIL,ZESTRIL) 30 MG tablet. Please advise, thank you!  [facility] [place], [place] called and left her this fax number [phone]

## 2017-03-21 NOTE — Telephone Encounter (Signed)
Last OV 01/10/17 last filled by Dr.Walker 02/12/16 90 3rf

## 2017-04-15 ENCOUNTER — Telehealth: Payer: Self-pay | Admitting: Family Medicine

## 2017-04-15 NOTE — Telephone Encounter (Signed)
Please have her discontinue the amlodipine and monitor her blood pressure. Please see if you can get her set up for follow-up when she'll be back in town. If her symptoms do not improve with decreasing the dose or if she has had any numbness, weakness, vision changes, slurred speech, or as continued imbalance and lightheadedness I would suggest being evaluated while she is out of town.

## 2017-04-15 NOTE — Telephone Encounter (Signed)
Patient stated that her amlodipine dose was doubled to 10mg . Started new dose and was feeling very uncoordinated, light headed. Described symptoms that she felt like she had too much to drink. Patient cut dose in half to 5 mg and BP readings have been 140/76, 139/77, 129/63, 137/67, 124/72, 121/65 and still having symptoms. She is still taking 30 mg of lisinopril. Patient stated that she is out of town and will be home Thursday morning if she needs to be seen anytime after that is fine.

## 2017-04-15 NOTE — Telephone Encounter (Signed)
Pt called stating that she has been starting the new blood pressure medication she is feeling uncoordinated, and light headed, she feels like she has had one too many. Pt states that her blood pressures have been: 140/76, 139/77, 129/63, 137/67, 124/72, 121/65. Pt has been taking half a tablet since about 7/3. Please advise, thank you!  Call pt @ [phone]

## 2017-04-17 NOTE — Telephone Encounter (Signed)
Patient instructed to stop taking amlodipine and monitor BP and scheduled to come in tomorrow at 1:15pm.

## 2017-04-18 ENCOUNTER — Encounter: Payer: Self-pay | Admitting: Family Medicine

## 2017-04-18 ENCOUNTER — Ambulatory Visit (INDEPENDENT_AMBULATORY_CARE_PROVIDER_SITE_OTHER): Payer: Medicare HMO | Admitting: Family Medicine

## 2017-04-18 VITALS — BP 120/62 | HR 70 | Temp 98.2°F | Wt 183.8 lb

## 2017-04-18 DIAGNOSIS — I10 Essential (primary) hypertension: Secondary | ICD-10-CM

## 2017-04-18 DIAGNOSIS — R42 Dizziness and giddiness: Secondary | ICD-10-CM

## 2017-04-18 LAB — COMPREHENSIVE METABOLIC PANEL
ALT: 17 U/L (ref 0–35)
AST: 17 U/L (ref 0–37)
Albumin: 4.3 g/dL (ref 3.5–5.2)
Alkaline Phosphatase: 64 U/L (ref 39–117)
BUN: 19 mg/dL (ref 6–23)
CALCIUM: 9.4 mg/dL (ref 8.4–10.5)
CHLORIDE: 106 meq/L (ref 96–112)
CO2: 27 meq/L (ref 19–32)
Creatinine, Ser: 0.89 mg/dL (ref 0.40–1.20)
GFR: 66.25 mL/min (ref >60.00)
Glucose, Bld: 113 mg/dL — ABNORMAL HIGH (ref 70–99)
Potassium: 4.1 mEq/L (ref 3.5–5.1)
Sodium: 138 mEq/L (ref 135–145)
Total Bilirubin: 0.5 mg/dL (ref 0.2–1.2)
Total Protein: 7 g/dL (ref 6.0–8.3)

## 2017-04-18 LAB — CBC
HCT: 42.9 % (ref 36.0–46.0)
HEMOGLOBIN: 14 g/dL (ref 12.0–15.0)
MCHC: 32.6 g/dL (ref 30.0–36.0)
MCV: 96.1 fl (ref 78.0–100.0)
PLATELETS: 237 10*3/uL (ref 150.0–400.0)
RBC: 4.46 Mil/uL (ref 3.87–5.11)
RDW: 13.4 % (ref 11.5–15.5)
WBC: 5.5 10*3/uL (ref 4.0–10.5)

## 2017-04-18 LAB — TSH: TSH: 1.04 u[IU]/mL (ref 0.35–4.50)

## 2017-04-18 MED ORDER — LISINOPRIL 20 MG PO TABS: 20.0000 mg | ORAL_TABLET | Freq: Every day | ORAL | 1 refills | Status: DC

## 2017-04-18 NOTE — Patient Instructions (Signed)
Nice to see you. We'll check some lab work today. We will have you discontinue the amlodipine. You will decrease the dose of lisinopril to 20 mg. A new prescription was sent to your pharmacy. We'll recheck you in 1-2 weeks for your blood pressure. If you continue to have symptoms please let us know.

## 2017-04-18 NOTE — Progress Notes (Signed)
  [name], MD Phone: [phone]  Monica Larson is a 71 y.o. female who presents today for f/u.  Patient notes since we increased her dose of amlodipine she has had issues with lightheadedness particularly if she stands up too quickly. She has felt tired and fatigued to some degree as well. Describes it as feeling as if she had one too many drinks. Has felt a little off balance at times. She has no chest pain or shortness of breath or palpitations. She notes no focal weakness or numbness. Does note occasionally her hands bilaterally will be numb when she wakes up in the morning though they resolve after change of position. She was advised to discontinue the amlodipine several days ago and notes she does feel better though still has some mild lightheadedness. No syncope.  ROS see history of present illness  Objective  Physical Exam Vitals:   04/18/17 1316  BP: 120/62  Pulse: 70  Temp: 98.2 F (36.8 C)  SpO2: 99%   Laying blood pressure 138/70 pulse 66 Sitting blood pressure 148/68 pulse 68 Standing blood pressure 128/70 pulse 80  BP Readings from Last 3 Encounters:  04/18/17 120/62  02/05/17 (!) 142/74  01/10/17 (!) 158/80   Wt Readings from Last 3 Encounters:  04/18/17 183 lb 12.8 oz (83.4 kg)  01/10/17 183 lb 3.2 oz (83.1 kg)  10/17/16 184 lb (83.5 kg)    Physical Exam  Constitutional: No distress.  Cardiovascular: Normal rate, regular rhythm and normal heart sounds.   Pulmonary/Chest: Effort normal and breath sounds normal.  Musculoskeletal: She exhibits no edema.  Neurological: She is alert.  CN 2-12 intact, 5/5 strength in bilateral biceps, triceps, grip, quads, hamstrings, plantar and dorsiflexion, sensation to light touch intact in bilateral UE and LE, normal gait, 2+ patellar reflexes, normal finger to nose, normal rapid alternating movements, no pronator drift, negative Romberg  Skin: Skin is warm. She is not diaphoretic.     Assessment/Plan: Please see  individual problem list.  Hypertension Patient with much improved blood pressure and has had some lightheadedness likely related to her medications. Orthostatics positive. The rest of her exam is negative today. Suspect numbness in the morning in her hands is related to position. She'll monitor. We will have her discontinue the amlodipine. We will decrease the dose of lisinopril. We'll check lab work as outlined below. She is given return precautions.   Orders Placed This Encounter  Procedures  . CBC  . Comp Met (CMET)  . TSH    Meds ordered this encounter  Medications  . lisinopril (PRINIVIL,ZESTRIL) 20 MG tablet    Sig: Take 1 tablet (20 mg total) by mouth daily.    Dispense:  90 tablet    Refill:  1   [name], MD [place]

## 2017-04-18 NOTE — Assessment & Plan Note (Addendum)
Patient with much improved blood pressure and has had some lightheadedness likely related to her medications. Orthostatics positive. The rest of her exam is negative today. Suspect numbness in the morning in her hands is related to position. She'll monitor. We will have her discontinue the amlodipine. We will decrease the dose of lisinopril. We'll check lab work as outlined below. She is given return precautions.

## 2017-04-21 ENCOUNTER — Other Ambulatory Visit: Payer: Self-pay | Admitting: Family Medicine

## 2017-04-21 ENCOUNTER — Telehealth: Payer: Self-pay | Admitting: Family Medicine

## 2017-04-21 DIAGNOSIS — R7309 Other abnormal glucose: Secondary | ICD-10-CM

## 2017-04-21 NOTE — Telephone Encounter (Signed)
Pt called back returning your call. Please advise, thank you!  Call pt @ [phone]

## 2017-04-21 NOTE — Telephone Encounter (Signed)
See result note.  

## 2017-04-29 DIAGNOSIS — D485 Neoplasm of uncertain behavior of skin: Secondary | ICD-10-CM | POA: Diagnosis not present

## 2017-04-29 DIAGNOSIS — S80261A Insect bite (nonvenomous), right knee, initial encounter: Secondary | ICD-10-CM | POA: Diagnosis not present

## 2017-04-29 DIAGNOSIS — Z08 Encounter for follow-up examination after completed treatment for malignant neoplasm: Secondary | ICD-10-CM | POA: Diagnosis not present

## 2017-04-29 DIAGNOSIS — C44529 Squamous cell carcinoma of skin of other part of trunk: Secondary | ICD-10-CM | POA: Diagnosis not present

## 2017-04-29 DIAGNOSIS — C44219 Basal cell carcinoma of skin of left ear and external auricular canal: Secondary | ICD-10-CM | POA: Diagnosis not present

## 2017-04-29 DIAGNOSIS — Z85828 Personal history of other malignant neoplasm of skin: Secondary | ICD-10-CM | POA: Diagnosis not present

## 2017-04-30 ENCOUNTER — Other Ambulatory Visit (INDEPENDENT_AMBULATORY_CARE_PROVIDER_SITE_OTHER): Payer: Medicare HMO

## 2017-04-30 ENCOUNTER — Encounter: Payer: Self-pay | Admitting: *Deleted

## 2017-04-30 ENCOUNTER — Ambulatory Visit (INDEPENDENT_AMBULATORY_CARE_PROVIDER_SITE_OTHER): Payer: Medicare HMO | Admitting: *Deleted

## 2017-04-30 VITALS — BP 164/78 | HR 51 | Resp 18

## 2017-04-30 DIAGNOSIS — R7309 Other abnormal glucose: Secondary | ICD-10-CM | POA: Diagnosis not present

## 2017-04-30 DIAGNOSIS — I10 Essential (primary) hypertension: Secondary | ICD-10-CM

## 2017-04-30 DIAGNOSIS — Z23 Encounter for immunization: Secondary | ICD-10-CM

## 2017-04-30 LAB — HEMOGLOBIN A1C: HEMOGLOBIN A1C: 5.6 % (ref 4.6–6.5)

## 2017-04-30 NOTE — Progress Notes (Signed)
Patient presented for BP check after medication change on 04/18/17 visit patient taking Lisinopril 20 mg daily, patient took medication at 6:30 am today, BP left arm 160/76 pulse 51 right arm BP 164/78 pulse 51.

## 2017-05-01 NOTE — Progress Notes (Signed)
Blood pressure is above goal. We discontinued one of her blood pressure medicines recently. Please see if she is checking her blood pressure at home and it's so find out what her readings have been. If not controlled at home we may need to go back up on the lisinopril.  [name], M.D.

## 2017-05-01 NOTE — Progress Notes (Signed)
Tried to reach patient at home no answer unable to leave voicemail

## 2017-05-02 NOTE — Progress Notes (Signed)
Patient stated she has not taken readings at home the last one she took out was high she said but could not give exact reading. Patient wanted to know should she go back to taking 30 mg of lisinopril?

## 2017-05-04 MED ORDER — LISINOPRIL 30 MG PO TABS: 30.0000 mg | ORAL_TABLET | Freq: Every day | ORAL | 1 refills | Status: DC

## 2017-05-04 NOTE — Progress Notes (Signed)
She should go back to lisinopril 30 mg daily and we will need to check a BMET one week after she increases the dose. I have placed an order for this.

## 2017-05-04 NOTE — Addendum Note (Signed)
Addended by: Leone Haven on: 05/04/2017 08:55 AM   Modules accepted: Orders

## 2017-05-05 NOTE — Progress Notes (Signed)
Left message to call office

## 2017-05-05 NOTE — Progress Notes (Signed)
Left message for patient to return call to office,.

## 2017-05-06 NOTE — Progress Notes (Signed)
Left message for to return call to office

## 2017-05-07 ENCOUNTER — Telehealth: Payer: Self-pay | Admitting: Family Medicine

## 2017-05-07 NOTE — Telephone Encounter (Signed)
Pt called returning your call.   Call pt @ [phone]. Thank you!

## 2017-05-08 NOTE — Telephone Encounter (Signed)
See clinical note from nurse visit.

## 2017-05-08 NOTE — Progress Notes (Signed)
Patient notified and she staed she will schedule Lab on return from her trip.

## 2017-05-20 DIAGNOSIS — N358 Other urethral stricture: Secondary | ICD-10-CM | POA: Diagnosis not present

## 2017-05-20 DIAGNOSIS — R102 Pelvic and perineal pain: Secondary | ICD-10-CM | POA: Diagnosis not present

## 2017-05-20 DIAGNOSIS — N952 Postmenopausal atrophic vaginitis: Secondary | ICD-10-CM | POA: Diagnosis not present

## 2017-05-20 DIAGNOSIS — R339 Retention of urine, unspecified: Secondary | ICD-10-CM | POA: Diagnosis not present

## 2017-05-27 DIAGNOSIS — C44529 Squamous cell carcinoma of skin of other part of trunk: Secondary | ICD-10-CM | POA: Diagnosis not present

## 2017-05-28 DIAGNOSIS — Z85828 Personal history of other malignant neoplasm of skin: Secondary | ICD-10-CM | POA: Diagnosis not present

## 2017-05-28 DIAGNOSIS — C44311 Basal cell carcinoma of skin of nose: Secondary | ICD-10-CM | POA: Diagnosis not present

## 2017-06-02 ENCOUNTER — Other Ambulatory Visit: Payer: Self-pay

## 2017-06-02 NOTE — Telephone Encounter (Signed)
Please check with the patient to see if she has continued to take this. Thanks.

## 2017-06-02 NOTE — Telephone Encounter (Signed)
Last OV 04/18/17 last filled 02/12/16 by dr.Walker 180 3rf

## 2017-06-03 NOTE — Telephone Encounter (Signed)
Tried calling, no vm 

## 2017-06-04 NOTE — Telephone Encounter (Signed)
Tried calling no vm 

## 2017-06-09 ENCOUNTER — Other Ambulatory Visit: Payer: Self-pay

## 2017-06-09 DIAGNOSIS — I10 Essential (primary) hypertension: Secondary | ICD-10-CM

## 2017-06-09 NOTE — Telephone Encounter (Signed)
Please contact the patient and see if she has continued the oxybutynin. Please also get her set up for a lab appointment as she needs lab work as we went back up on her lisinopril. Once you get in touch with her we can consider refilling these medications.

## 2017-06-09 NOTE — Telephone Encounter (Signed)
Last OV 04/18/17 last filled  Oxybutynin by Dr.Walker 02/12/16 180 3rf Lisinopril 05/04/17 90 1rf

## 2017-06-12 MED ORDER — OXYBUTYNIN CHLORIDE 5 MG PO TABS: 10.0000 mg | ORAL_TABLET | Freq: Every day | ORAL | 3 refills | Status: DC

## 2017-06-12 MED ORDER — LISINOPRIL 30 MG PO TABS: 30.0000 mg | ORAL_TABLET | Freq: Every day | ORAL | 1 refills | Status: DC

## 2017-06-12 NOTE — Telephone Encounter (Signed)
Patient states she does take the oxybutynin 2 tablets a day, patient is scheduled for lab please place order

## 2017-06-12 NOTE — Telephone Encounter (Signed)
Left message to return call 

## 2017-06-12 NOTE — Telephone Encounter (Signed)
Order placed. Sent to pharmacy. 

## 2017-06-13 ENCOUNTER — Other Ambulatory Visit (INDEPENDENT_AMBULATORY_CARE_PROVIDER_SITE_OTHER): Payer: Medicare HMO

## 2017-06-13 DIAGNOSIS — I10 Essential (primary) hypertension: Secondary | ICD-10-CM | POA: Diagnosis not present

## 2017-06-13 LAB — BASIC METABOLIC PANEL
BUN: 14 mg/dL (ref 6–23)
CALCIUM: 9.5 mg/dL (ref 8.4–10.5)
CO2: 25 mEq/L (ref 19–32)
CREATININE: 1.04 mg/dL (ref 0.40–1.20)
Chloride: 104 mEq/L (ref 96–112)
GFR: 55.33 mL/min — AB (ref >60.00)
Glucose, Bld: 116 mg/dL — ABNORMAL HIGH (ref 70–99)
POTASSIUM: 3.9 meq/L (ref 3.5–5.1)
Sodium: 138 mEq/L (ref 135–145)

## 2017-06-14 ENCOUNTER — Other Ambulatory Visit: Payer: Self-pay | Admitting: Family Medicine

## 2017-06-14 DIAGNOSIS — I10 Essential (primary) hypertension: Secondary | ICD-10-CM

## 2017-06-17 ENCOUNTER — Telehealth: Payer: Self-pay | Admitting: *Deleted

## 2017-06-17 IMAGING — MG MM DIGITAL DIAGNOSTIC BILAT W/ TOMO W/ CAD
8 of 15 series · 8 of 35 positions shown · non-contrast
Comparison: Previous exam(s).

CLINICAL DATA: 70-year-old female with lumpiness and tenderness in
the medial left breast at 9 o'clock.

EXAM:
2D DIGITAL DIAGNOSTIC BILATERAL MAMMOGRAM WITH CAD AND ADJUNCT TOMO
ULTRASOUND LEFT BREAST

[L CC]
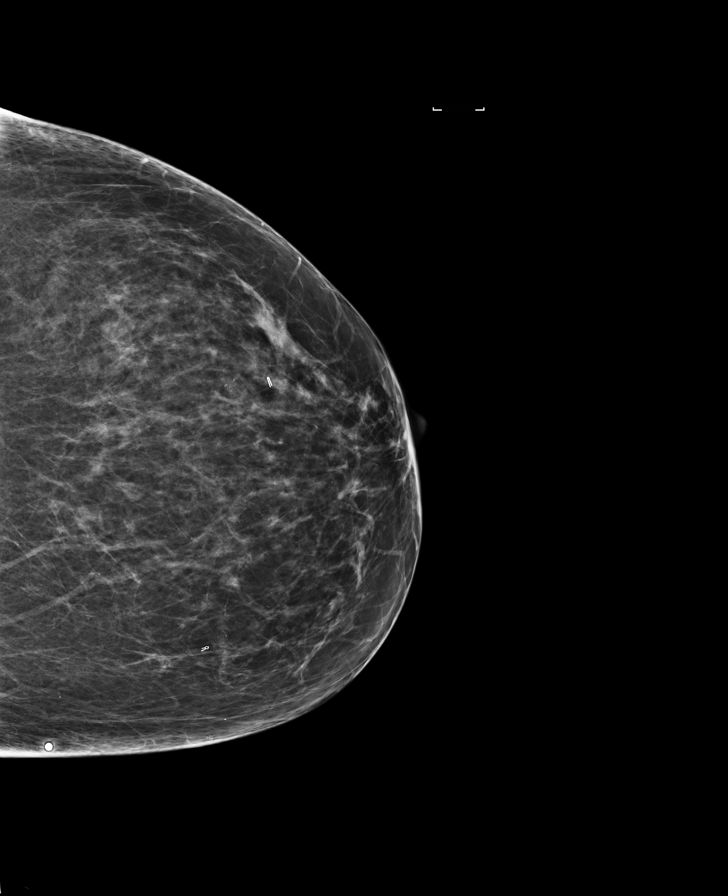

[R MLO]
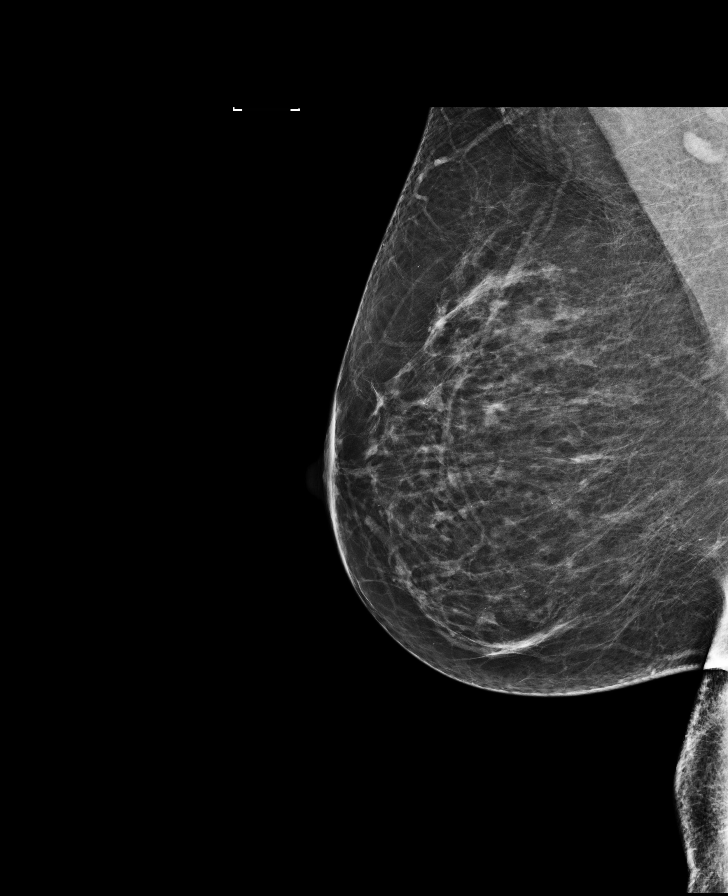

[R CC synth-2D]
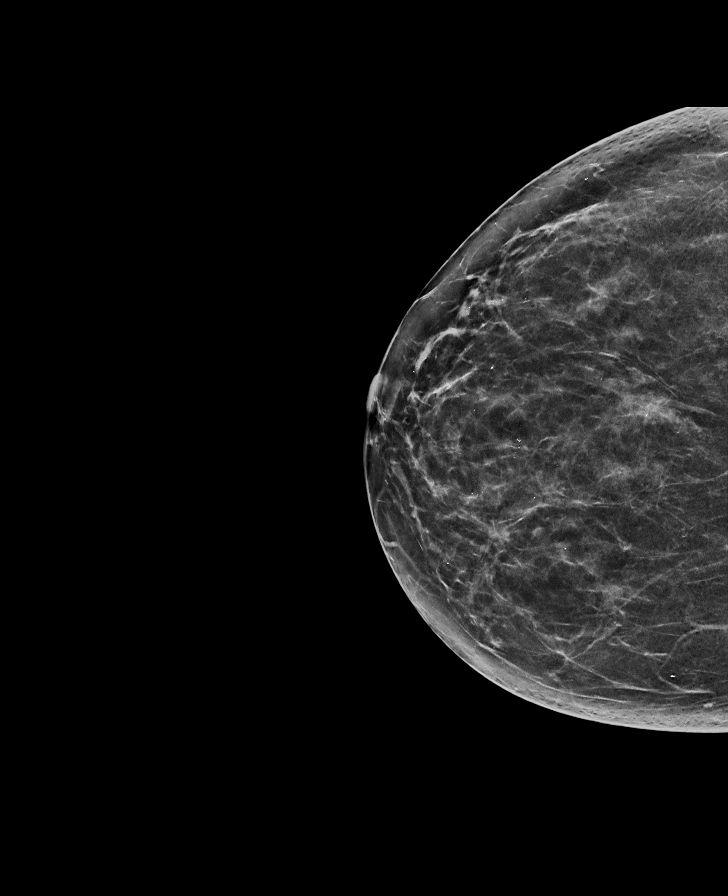

[L CC synth-2D]
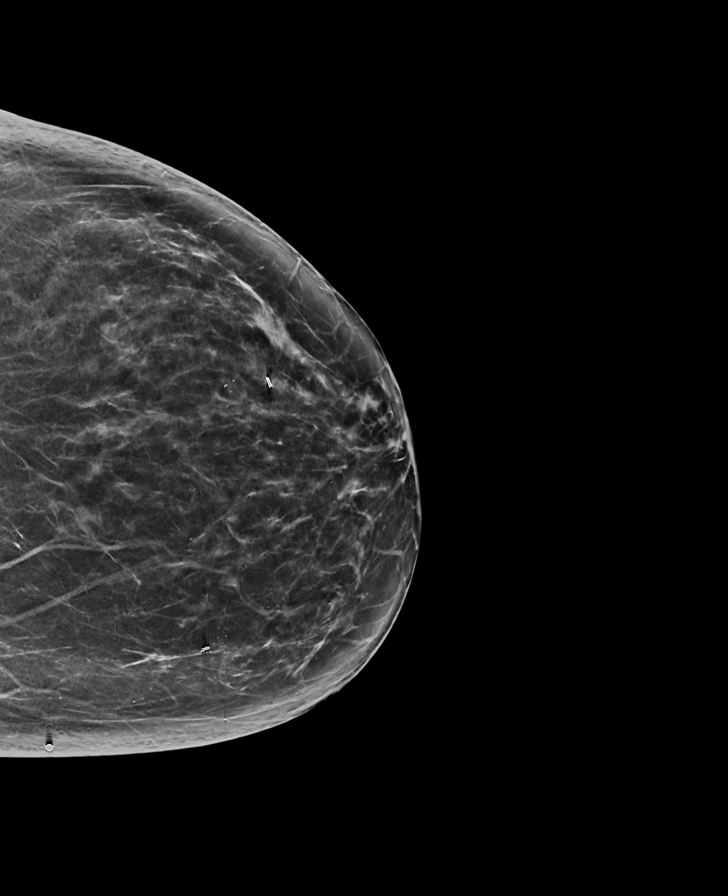

[R MLO synth-2D]
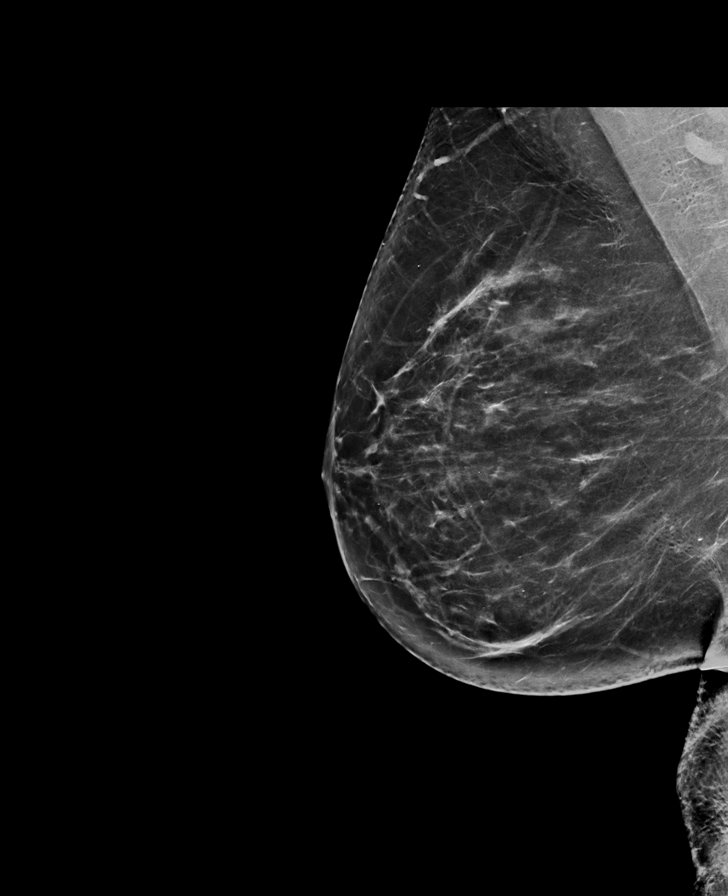

[R CC]
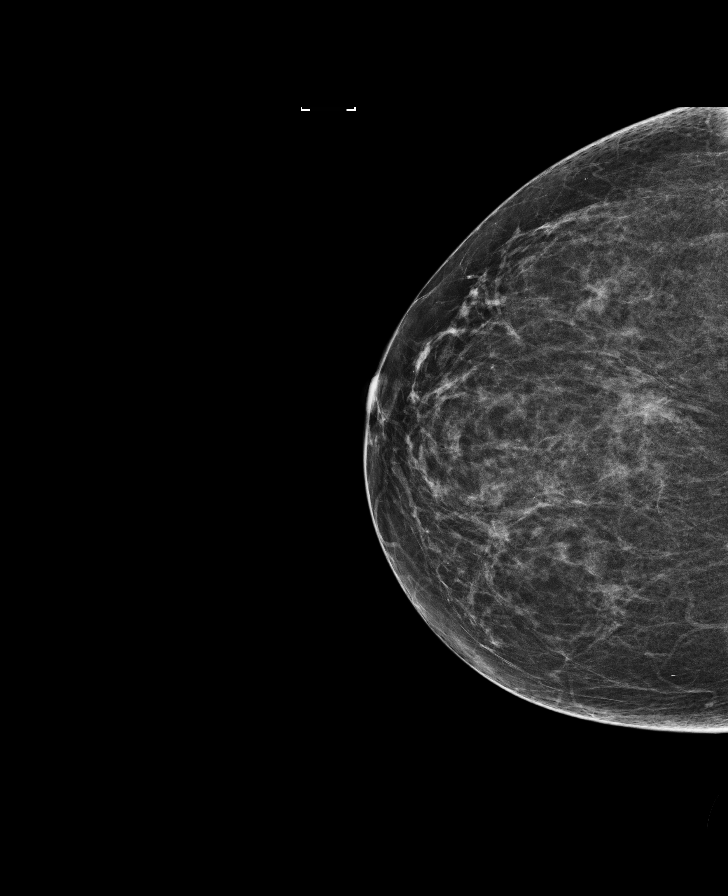

[L TAN synth-2D]
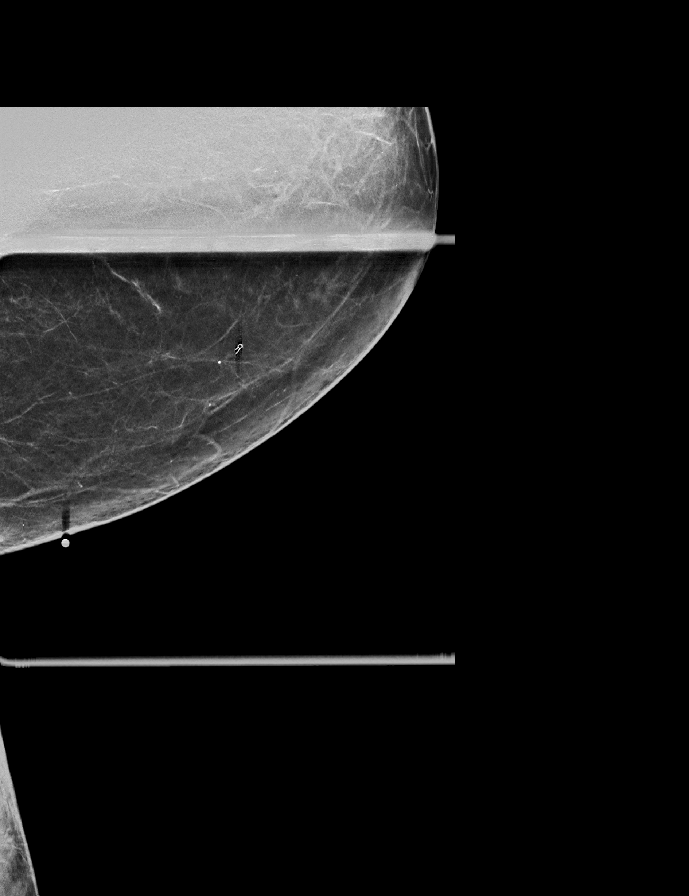

[L MLO synth-2D]
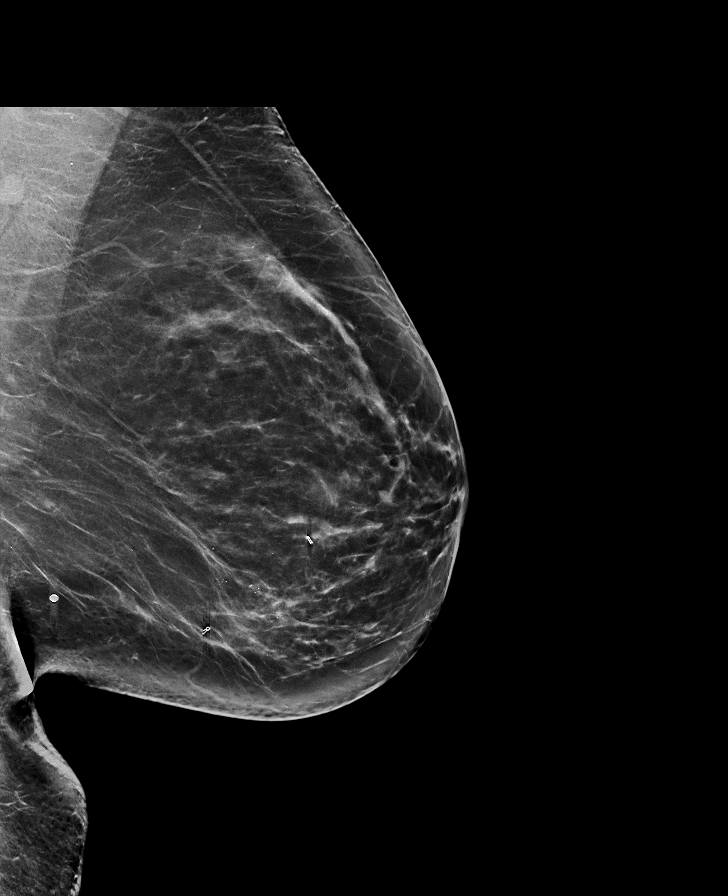

[8 of 35 positions shown; findings below may reference images not displayed]

ACR Breast Density Category c: The breast tissue is heterogeneously
dense, which may obscure small masses.
FINDINGS: No suspicious mass, calcifications, or other abnormality is
identified within either breast.

Mammographic images were processed with CAD.

On physical exam, no discrete mass is felt in the area of concern
within the medial left breast.

Targeted ultrasound is performed, showing no suspicious cystic or
solid sonographic finding in the area of concern in the medial left
breast.
IMPRESSION: No mammographic or sonographic evidence of malignancy.

RECOMMENDATION:
1. Screening mammogram in one year.(Code:[id number]-[id number])
2. The patient was instructed to follow-up with her referring
physician regarding the area of concern in her medial left breast.

I have discussed the findings and recommendations with the patient.
Results were also provided in writing at the conclusion of the
visit. If applicable, a reminder letter will be sent to the patient
regarding the next appointment.

BI-RADS CATEGORY  1: Negative.

## 2017-06-17 NOTE — Telephone Encounter (Signed)
See result note.  

## 2017-06-17 NOTE — Telephone Encounter (Signed)
Pt requested lab results  Pt contact [phone]

## 2017-07-01 ENCOUNTER — Ambulatory Visit (INDEPENDENT_AMBULATORY_CARE_PROVIDER_SITE_OTHER): Payer: Medicare HMO | Admitting: Family Medicine

## 2017-07-01 ENCOUNTER — Other Ambulatory Visit: Payer: Medicare HMO

## 2017-07-01 ENCOUNTER — Encounter: Payer: Self-pay | Admitting: Family Medicine

## 2017-07-01 VITALS — BP 138/64 | HR 69 | Temp 97.9°F | Wt 184.2 lb

## 2017-07-01 DIAGNOSIS — N35919 Unspecified urethral stricture, male, unspecified site: Secondary | ICD-10-CM | POA: Diagnosis not present

## 2017-07-01 DIAGNOSIS — I10 Essential (primary) hypertension: Secondary | ICD-10-CM | POA: Diagnosis not present

## 2017-07-01 DIAGNOSIS — N3941 Urge incontinence: Secondary | ICD-10-CM

## 2017-07-01 DIAGNOSIS — R69 Illness, unspecified: Secondary | ICD-10-CM | POA: Diagnosis not present

## 2017-07-01 DIAGNOSIS — R0600 Dyspnea, unspecified: Secondary | ICD-10-CM | POA: Diagnosis not present

## 2017-07-01 DIAGNOSIS — R339 Retention of urine, unspecified: Secondary | ICD-10-CM | POA: Diagnosis not present

## 2017-07-01 LAB — BASIC METABOLIC PANEL
BUN: 13 mg/dL (ref 6–23)
CALCIUM: 9.7 mg/dL (ref 8.4–10.5)
CHLORIDE: 106 meq/L (ref 96–112)
CO2: 31 meq/L (ref 19–32)
Creatinine, Ser: 0.86 mg/dL (ref 0.40–1.20)
GFR: 68.89 mL/min (ref >60.00)
GLUCOSE: 110 mg/dL — AB (ref 70–99)
Potassium: 4.1 mEq/L (ref 3.5–5.1)
SODIUM: 142 meq/L (ref 135–145)

## 2017-07-01 LAB — CBC
HEMATOCRIT: 44.1 % (ref 36.0–46.0)
HEMOGLOBIN: 14.6 g/dL (ref 12.0–15.0)
MCHC: 33.2 g/dL (ref 30.0–36.0)
MCV: 95.2 fl (ref 78.0–100.0)
Platelets: 219 10*3/uL (ref 150.0–400.0)
RBC: 4.63 Mil/uL (ref 3.87–5.11)
RDW: 13.1 % (ref 11.5–15.5)
WBC: 5.9 10*3/uL (ref 4.0–10.5)

## 2017-07-01 NOTE — Patient Instructions (Signed)
Nice to see you. We'll check some lab work and contact with the results. Please continue to monitor your blood pressure.

## 2017-07-01 NOTE — Assessment & Plan Note (Signed)
Stable. Relatively well treated with oxybutynin.

## 2017-07-01 NOTE — Progress Notes (Signed)
  [name], MD Phone: [phone]  Monica Larson is a 71 y.o. female who presents today for follow-up.  Hypertension: Typically between 130-150/60's-70s. Taking lisinopril 30 mg daily. No chest pain. Does note some dyspnea at times over the last 6 months that she feels is related to her weight. She can walk a fairly long way without any issues though if she persistently walks she'll get dyspneic. No orthopnea or PND. No edema. She does note some lightheadedness at times when going from seated to standing. This has improved since discontinuing the amlodipine.  Creatinine slightly bumped up after increasing lisinopril dose. Due for recheck today. She does note she takes Aleve for joint pain. 2-3 times a week.  Urge incontinence: Taking oxybutynin 10 mg daily. Helps with her urinary urgency significantly.   PMH: nonsmoker.   ROS see history of present illness  Objective  Physical Exam Vitals:   07/01/17 1314  BP: 138/64  Pulse: 69  Temp: 97.9 F (36.6 C)  SpO2: 98%    BP Readings from Last 3 Encounters:  07/01/17 138/64  04/30/17 (!) 164/78  04/18/17 120/62   Wt Readings from Last 3 Encounters:  07/01/17 184 lb 3.2 oz (83.6 kg)  04/18/17 183 lb 12.8 oz (83.4 kg)  01/10/17 183 lb 3.2 oz (83.1 kg)    Physical Exam  Constitutional: No distress.  Cardiovascular: Normal rate, regular rhythm and normal heart sounds.   Pulmonary/Chest: Effort normal and breath sounds normal.  Musculoskeletal: She exhibits no edema.  Neurological: She is alert. Gait normal.  Skin: Skin is warm and dry. She is not diaphoretic.     Assessment/Plan: Please see individual problem list.  Hypertension Relatively well controlled. Continues to have some intermittent orthostasis. Discussed staying well-hydrated and rising slowly from seated or bent over positions. We would need to consider decreasing her dose of lisinopril if this does not continue to improve. Recheck kidney function. Encouraged  hydration. Advised against persistent NSAID use.  Dyspnea Patient has noted this over the last 6 or so months. Discussed cardiac evaluation with EKG to start with though she declined this. She was acceptable of getting a CBC to check for anemia. She wanted to continue to monitor this. If it worsens or she develops new symptoms she needs to be evaluated and she was advised of this.  Urge incontinence Stable. Relatively well treated with oxybutynin.   Orders Placed This Encounter  Procedures  . CBC  . Basic Metabolic Panel (BMET)    [name], MD [place]

## 2017-07-01 NOTE — Assessment & Plan Note (Signed)
Patient has noted this over the last 6 or so months. Discussed cardiac evaluation with EKG to start with though she declined this. She was acceptable of getting a CBC to check for anemia. She wanted to continue to monitor this. If it worsens or she develops new symptoms she needs to be evaluated and she was advised of this.

## 2017-07-01 NOTE — Assessment & Plan Note (Addendum)
Relatively well controlled. Continues to have some intermittent orthostasis. Discussed staying well-hydrated and rising slowly from seated or bent over positions. We would need to consider decreasing her dose of lisinopril if this does not continue to improve. Recheck kidney function. Encouraged hydration. Advised against persistent NSAID use.

## 2017-07-02 DIAGNOSIS — R69 Illness, unspecified: Secondary | ICD-10-CM | POA: Diagnosis not present

## 2017-07-22 ENCOUNTER — Other Ambulatory Visit: Payer: Self-pay | Admitting: Family Medicine

## 2017-07-22 DIAGNOSIS — Z1231 Encounter for screening mammogram for malignant neoplasm of breast: Secondary | ICD-10-CM

## 2017-07-22 DIAGNOSIS — K006 Disturbances in tooth eruption: Secondary | ICD-10-CM | POA: Diagnosis not present

## 2017-08-14 ENCOUNTER — Ambulatory Visit
Admission: RE | Admit: 2017-08-14 | Discharge: 2017-08-14 | Disposition: A | Discharge status: Home / Self Care | Payer: Medicare HMO | Source: Ambulatory Visit | Attending: Family Medicine | Admitting: Family Medicine

## 2017-08-14 DIAGNOSIS — Z1231 Encounter for screening mammogram for malignant neoplasm of breast: Secondary | ICD-10-CM | POA: Diagnosis not present

## 2017-08-22 ENCOUNTER — Other Ambulatory Visit: Payer: Self-pay

## 2017-08-22 MED ORDER — OMEPRAZOLE 20 MG PO CPDR: 20.0000 mg | DELAYED_RELEASE_CAPSULE | Freq: Every day | ORAL | 1 refills | Status: DC | PRN

## 2017-09-30 ENCOUNTER — Ambulatory Visit: Payer: Self-pay | Admitting: *Deleted

## 2017-09-30 NOTE — Telephone Encounter (Signed)
Pt denying chest tightness and shortness of breath at this time. Pt states that she experienced chest tightness and shortness of breath over the weekend when she was out of town at a competition with her granddaughter. Pt states while walking she had to stop and tell her family to continue [facility] she had to stop and rest due to shortness of breath. Pt sates she only experiences the symptoms with increased activity and the pain does not worsen with deep breathing.Pt states she was seen for the same complaints in October by  Dr. Caryl Bis and initially refused to have a Cardiology consult. Pt is now agreeable to have a referral to Cardiology but is concerned about who she would be referred to due to her insurance, which is [employer]. Pt advised to go to ED for current symptoms, but the pt refused. Pt offered to make an appt to be see by another provider in the office to be seen sooner, but pt refused this as well. Pt scheduled for appt with Dr. Caryl Bis on Friday, Jan 25th at 2:45pm. Encouraged pt to go to ED if symptoms persisted and pt stated that she would if symptoms did not get better before appt. Reason for Disposition . Difficulty breathing  Answer Assessment - Initial Assessment Questions 1. LOCATION: "Where does it hurt?"       Across chest 2. RADIATION: "Does the pain go anywhere else?" (e.g., into neck, jaw, arms, back)     No 3. ONSET: "When did the chest pain begin?" (Minutes, hours or days)      Over the weekend, but just got home on Sunday night 4. PATTERN "Does the pain come and go, or has it been constant since it started?"  "Does it get worse with exertion?"      Comes and goes 5. DURATION: "How long does it last" (e.g., seconds, minutes, hours)     Last until she rests 6. SEVERITY: "How bad is the pain?"  (e.g., Scale 1-10; mild, moderate, or severe)    - MILD (1-3): doesn't interfere with normal activities     - MODERATE (4-7): interferes with normal activities or awakens from  sleep    - SEVERE (8-10): excruciating pain, unable to do any normal activities       7-8 7. CARDIAC RISK FACTORS: "Do you have any history of heart problems or risk factors for heart disease?" (e.g., prior heart attack, angina; high blood pressure, diabetes, being overweight, high cholesterol, smoking, or strong family history of heart disease)     HTN, father and brother had a heart attack, on the verge of high cholesterol, overweight 8. PULMONARY RISK FACTORS: "Do you have any history of lung disease?"  (e.g., blood clots in lung, asthma, emphysema, birth control pills)     Asthma 9. CAUSE: "What do you think is causing the chest pain?"     Activity 10. OTHER SYMPTOMS: "Do you have any other symptoms?" (e.g., dizziness, nausea, vomiting, sweating, fever, difficulty breathing, cough)       "Eyes see little spots", SOB with activity  Protocols used: CHEST PAIN-A-AH

## 2017-09-30 NOTE — Telephone Encounter (Signed)
FYI

## 2017-09-30 NOTE — Telephone Encounter (Signed)
Noted.  Please follow-up with the patient and reinforce that if she were to develop recurrent symptoms she would need to be evaluated in the emergency department prior to Friday.  Thanks.

## 2017-10-01 ENCOUNTER — Telehealth: Payer: Self-pay | Admitting: Family Medicine

## 2017-10-01 DIAGNOSIS — Z85828 Personal history of other malignant neoplasm of skin: Secondary | ICD-10-CM | POA: Diagnosis not present

## 2017-10-01 DIAGNOSIS — L570 Actinic keratosis: Secondary | ICD-10-CM | POA: Diagnosis not present

## 2017-10-01 DIAGNOSIS — Z08 Encounter for follow-up examination after completed treatment for malignant neoplasm: Secondary | ICD-10-CM | POA: Diagnosis not present

## 2017-10-01 DIAGNOSIS — X32XXXA Exposure to sunlight, initial encounter: Secondary | ICD-10-CM | POA: Diagnosis not present

## 2017-10-01 DIAGNOSIS — L821 Other seborrheic keratosis: Secondary | ICD-10-CM | POA: Diagnosis not present

## 2017-10-01 DIAGNOSIS — L820 Inflamed seborrheic keratosis: Secondary | ICD-10-CM | POA: Diagnosis not present

## 2017-10-01 DIAGNOSIS — L298 Other pruritus: Secondary | ICD-10-CM | POA: Diagnosis not present

## 2017-10-01 NOTE — Telephone Encounter (Signed)
She returned the call.  Made pt aware that Dr. Caryl Bis wants her to go to the ED if her symptoms reoccur prior to the appt with him on Friday.  Pt verbalized understanding.   She denied having any other symptoms since yesterday's conversation with the triage nurse.  I routed a note to Dr. Ellen Henri nurse pool letting them know I had relayed the message from Dr. Caryl Bis to the pt.

## 2017-10-01 NOTE — Telephone Encounter (Signed)
Left message for patient to return call back.  Patient needs to be informed of Dr. Caryl Bis statement. [facility] may inform patient.

## 2017-10-03 ENCOUNTER — Encounter: Payer: Self-pay | Admitting: Family Medicine

## 2017-10-03 ENCOUNTER — Other Ambulatory Visit: Payer: Self-pay

## 2017-10-03 ENCOUNTER — Ambulatory Visit (INDEPENDENT_AMBULATORY_CARE_PROVIDER_SITE_OTHER): Payer: Medicare HMO | Admitting: Family Medicine

## 2017-10-03 VITALS — BP 140/72 | HR 68 | Temp 98.0°F | Wt 185.0 lb

## 2017-10-03 DIAGNOSIS — R079 Chest pain, unspecified: Secondary | ICD-10-CM

## 2017-10-03 MED ORDER — OMEPRAZOLE 20 MG PO CPDR: 20.0000 mg | DELAYED_RELEASE_CAPSULE | Freq: Every day | ORAL | 1 refills | Status: DC | PRN

## 2017-10-03 MED ORDER — TRAZODONE HCL 150 MG PO TABS
150.0000 mg | ORAL_TABLET | Freq: Every day | ORAL | 3 refills | Status: DC
Start: 2017-10-03 — End: 2020-10-10

## 2017-10-03 MED ORDER — LISINOPRIL 30 MG PO TABS: 30.0000 mg | ORAL_TABLET | Freq: Every day | ORAL | 1 refills | Status: DC

## 2017-10-03 MED ORDER — OXYBUTYNIN CHLORIDE 5 MG PO TABS: 10.0000 mg | ORAL_TABLET | Freq: Every day | ORAL | 3 refills | Status: DC

## 2017-10-03 NOTE — Assessment & Plan Note (Addendum)
Symptoms seem consistent with a cardiac etiology.  EKG is reassuring. we will refer her to cardiology for further evaluation.  She was advised of return precautions and reasons to go to the emergency department.  She will return next week for fasting lab work.  She will continue her aspirin.

## 2017-10-03 NOTE — Progress Notes (Signed)
[name], MD Phone: [phone]  Monica Larson is a 72 y.o. female who presents today for same-day visit.  Patient notes intermittently for some time now she has had dyspnea on exertion if she particularly exerts herself at the gym.  Notes over the weekend she had dyspnea with walking from building to building at a cheerleading competition with her grandchildren.  She has had intermittent centralized chest discomfort at the gym on the treadmill as well as walking over the weekend.  She notes this goes away with rest.  No radiation to her neck or arms.  No diaphoresis.  Resolves after about 5 minutes.  No edema.  No recent travel or surgeries.  No history of blood clot.  Does have a family history of cardiac disease.  Social History   Tobacco Use  Smoking Status Never Smoker  Smokeless Tobacco Never Used  Tobacco Comment   1 cig every 3 months      ROS see history of present illness  Objective  Physical Exam Vitals:   10/03/17 1504  BP: 140/72  Pulse: 68  Temp: 98 F (36.7 C)  SpO2: 97%    BP Readings from Last 3 Encounters:  10/03/17 140/72  07/01/17 138/64  04/30/17 (!) 164/78   Wt Readings from Last 3 Encounters:  10/03/17 185 lb (83.9 kg)  07/01/17 184 lb 3.2 oz (83.6 kg)  04/18/17 183 lb 12.8 oz (83.4 kg)    Physical Exam  Constitutional: No distress.  Cardiovascular: Normal rate, regular rhythm and normal heart sounds.  Pulmonary/Chest: Effort normal and breath sounds normal.  Musculoskeletal: She exhibits no edema.  Neurological: She is alert. Gait normal.  Skin: Skin is warm and dry. She is not diaphoretic.   EKG: Normal sinus rhythm, rate 66, no ischemic changes noted.  Assessment/Plan: Please see individual problem list.  Exertional chest pain Symptoms seem consistent with a cardiac etiology.  EKG is reassuring. we will refer her to cardiology for further evaluation.  She was advised of return precautions and reasons to go to the emergency  department.  She will return next week for fasting lab work.  She will continue her aspirin.   Orders Placed This Encounter  Procedures  . Lipid panel    Standing Status:   Future    Standing Expiration Date:   10/03/2018  . Comp Met (CMET)    Standing Status:   Future    Standing Expiration Date:   10/03/2018  . CBC    Standing Status:   Future    Standing Expiration Date:   10/03/2018  . Ambulatory referral to Cardiology    Referral Priority:   Routine    Referral Type:   Consultation    Referral Reason:   Specialty Services Required    Requested Specialty:   Cardiology    Number of Visits Requested:   1  . EKG 12-Lead    Meds ordered this encounter  Medications  . lisinopril (PRINIVIL,ZESTRIL) 30 MG tablet    Sig: Take 1 tablet (30 mg total) by mouth daily.    Dispense:  90 tablet    Refill:  1  . omeprazole (PRILOSEC) 20 MG capsule    Sig: Take 1 capsule (20 mg total) by mouth daily as needed. For acid reflux    Dispense:  90 capsule    Refill:  1  . oxybutynin (DITROPAN) 5 MG tablet    Sig: Take 2 tablets (10 mg total) by mouth daily.    Dispense:  180 tablet    Refill:  3  . traZODone (DESYREL) 150 MG tablet    Sig: Take 1 tablet (150 mg total) by mouth at bedtime.    Dispense:  90 tablet    Refill:  [place], MD [place]

## 2017-10-03 NOTE — Patient Instructions (Signed)
Nice to see you. We will get you to see cardiology. If you develop persistent chest pain or shortness of breath that does not resolve with rest fairly quickly please go to the emergency room.

## 2017-10-15 ENCOUNTER — Other Ambulatory Visit (INDEPENDENT_AMBULATORY_CARE_PROVIDER_SITE_OTHER): Payer: Medicare HMO

## 2017-10-15 ENCOUNTER — Other Ambulatory Visit: Payer: Self-pay | Admitting: Family Medicine

## 2017-10-15 DIAGNOSIS — R079 Chest pain, unspecified: Secondary | ICD-10-CM

## 2017-10-15 DIAGNOSIS — E785 Hyperlipidemia, unspecified: Secondary | ICD-10-CM

## 2017-10-15 LAB — COMPREHENSIVE METABOLIC PANEL
ALK PHOS: 64 U/L (ref 39–117)
ALT: 14 U/L (ref 0–35)
AST: 14 U/L (ref 0–37)
Albumin: 4.1 g/dL (ref 3.5–5.2)
BUN: 16 mg/dL (ref 6–23)
CHLORIDE: 106 meq/L (ref 96–112)
CO2: 29 mEq/L (ref 19–32)
CREATININE: 0.86 mg/dL (ref 0.40–1.20)
Calcium: 9.3 mg/dL (ref 8.4–10.5)
GFR: 68.83 mL/min (ref >60.00)
GLUCOSE: 119 mg/dL — AB (ref 70–99)
Potassium: 4.2 mEq/L (ref 3.5–5.1)
SODIUM: 141 meq/L (ref 135–145)
TOTAL PROTEIN: 6.8 g/dL (ref 6.0–8.3)
Total Bilirubin: 0.5 mg/dL (ref 0.2–1.2)

## 2017-10-15 LAB — LIPID PANEL
Cholesterol: 213 mg/dL — ABNORMAL HIGH (ref 0–200)
HDL: 50.1 mg/dL (ref >39.00)
LDL CALC: 146 mg/dL — AB (ref 0–99)
NONHDL: 163.33
Total CHOL/HDL Ratio: 4
Triglycerides: 87 mg/dL (ref 0.0–149.0)
VLDL: 17.4 mg/dL (ref 0.0–40.0)

## 2017-10-15 LAB — CBC
HEMATOCRIT: 43.8 % (ref 36.0–46.0)
Hemoglobin: 14.7 g/dL (ref 12.0–15.0)
MCHC: 33.5 g/dL (ref 30.0–36.0)
MCV: 93.6 fl (ref 78.0–100.0)
Platelets: 240 10*3/uL (ref 150.0–400.0)
RBC: 4.68 Mil/uL (ref 3.87–5.11)
RDW: 13.6 % (ref 11.5–15.5)
WBC: 5.2 10*3/uL (ref 4.0–10.5)

## 2017-10-15 MED ORDER — ROSUVASTATIN CALCIUM 20 MG PO TABS
20.0000 mg | ORAL_TABLET | Freq: Every day | ORAL | 3 refills | Status: DC
Start: 2017-10-15 — End: 2018-08-25

## 2017-10-15 NOTE — Progress Notes (Signed)
The 10-year ASCVD risk score Mikey Bussing DC Brooke Bonito., et al., 2013) is: 18.7%   Values used to calculate the score:     Age: 72 years     Sex: Female     Is Non-Hispanic African American: No     Diabetic: No     Tobacco smoker: No     Systolic Blood Pressure: 025 mmHg     Is BP treated: Yes     HDL Cholesterol: 50.1 mg/dL     Total Cholesterol: 213 mg/dL

## 2017-10-20 ENCOUNTER — Ambulatory Visit: Payer: Commercial Managed Care - HMO

## 2017-11-10 NOTE — Progress Notes (Signed)
Cardiology Office Note  Date:  11/12/2017   ID:  Monica Larson, Monica Larson July 04, 1946, MRN [id number]  PCP:  Leone Haven, MD   Chief Complaint  Patient presents with  . OTHER    Chest pain with exertion and seeing floaters. Meds reviewed verbally with pt.    HPI:  Monica Larson is a 72 year old woman with past medical history of Anxiety Minimal smoking Obesity Hyperlipidemia GERD HTN Referred by Dr. Caryl Bis for chest pain and SOB Seen 06/26/2016 by Dr. Yvone Neu (similar sx at that time)  In follow-up today she reports having chronic left-sided chest pain sometimes at rest sometimes with exertion Uncertain if she is having similar symptoms as in  2017 when she saw cardiology and had stress test that showed no ischemia   sedentary Was that the Gym 6 months ago on a more regular basis  Treadmill 30 min Has not been exercising recently Went several weeks ago felt that she had tightness in her left chest   dyspnea with walking from building to building at a cheerleading competition with her grandchildren.    intermittent centralized chest discomfort at the gym on the treadmill as well as walking over the weekend.    No radiation to her neck or arms.  CT ABD/pelvis in 2017 Images pulled up in the office with her today Moderate calcified atherosclerotic disease involves the abdominal aorta.   Carotid 2011, L>R soft plaque  Stress test 06/2016 Pharmacological myocardial perfusion imaging study with no significant  ischemia Normal wall motion, EF estimated at 79% No EKG changes concerning for ischemia at peak stress or in recovery. Low risk scan  EKG personally reviewed by myself on todays visit ShowsNormal sinus rhythm rate 72 bpm no significant ST or T wave changes  PMH:   has a past medical history of Arthritis, Asthma, Diarrhea, Fatigue, GERD (gastroesophageal reflux disease), Headache, HLD (hyperlipidemia), HTN (hypertension), Motion sickness, Seasonal allergies, and Shortness  of breath dyspnea.  PSH:    Past Surgical History:  Procedure Laterality Date  . BREAST BIOPSY Bilateral    NEG  . BREAST EXCISIONAL BIOPSY Bilateral    NEG  . CARPAL TUNNEL RELEASE     right wrist  . CHOLECYSTECTOMY    . COLONOSCOPY N/A 01/31/2016   Procedure: COLONOSCOPY;  Surgeon: Hulen Luster, MD;  Location: [facility];  Service: Gastroenterology;  Laterality: N/A;  . EYE SURGERY     muscular as child/ CATARACT BILATERAL  . SKIN CANCER EXCISION Right Wrist and thigh (x2)   Unsure of dates and Dermatologist  . VESICOVAGINAL FISTULA CLOSURE W/ TAH      Current Outpatient Medications  Medication Sig Dispense Refill  . aspirin (ASPIR-LOW) 81 MG EC tablet Take 81 mg by mouth daily.     . diphenhydrAMINE (BENADRYL) 25 MG tablet Take 25 mg by mouth daily as needed for itching, allergies or sleep.    Marland Kitchen lisinopril (PRINIVIL,ZESTRIL) 30 MG tablet Take 1 tablet (30 mg total) by mouth daily. 90 tablet 1  . Multiple Vitamins-Minerals (MULTIVITAMIN WITH MINERALS) tablet Take 1 tablet by mouth daily.     . Omega-3 Fatty Acids (OMEGA-3 FISH OIL PO) Take by mouth.    Marland Kitchen omeprazole (PRILOSEC) 20 MG capsule Take 1 capsule (20 mg total) by mouth daily as needed. For acid reflux 90 capsule 1  . oxybutynin (DITROPAN) 5 MG tablet Take 2 tablets (10 mg total) by mouth daily. 180 tablet 3  . rosuvastatin (CRESTOR) 20 MG tablet Take 1  tablet (20 mg total) by mouth daily. 90 tablet 3  . traZODone (DESYREL) 150 MG tablet Take 1 tablet (150 mg total) by mouth at bedtime. 90 tablet 3   No current facility-administered medications for this visit.      Allergies:   Shellfish allergy; Sulfa antibiotics; and Sulfonamide derivatives   Social History:  The patient  reports that  has never smoked. she has never used smokeless tobacco. She reports that she drinks alcohol. She reports that she does not use drugs.   Family History:   family history includes Coronary artery disease in her father; Diabetes in  her father; Heart attack in her brother and father; Ovarian cancer in her mother; Prostate cancer in her father.    Review of Systems: Review of Systems  Constitutional: Negative.   Respiratory: Negative.   Cardiovascular: Negative.   Gastrointestinal: Negative.   Musculoskeletal: Negative.   Neurological: Negative.   Psychiatric/Behavioral: Negative.   All other systems reviewed and are negative.    PHYSICAL EXAM: VS:  BP 128/68 (BP Location: Right Arm, Patient Position: Sitting, Cuff Size: Normal)   Pulse 71   Ht 5\' 7"  (1.702 m)   Wt 183 lb 8 oz (83.2 kg)   BMI 28.74 kg/m  , BMI Body mass index is 28.74 kg/m. GEN: Well nourished, well developed, in no acute distress  HEENT: normal  Neck: no JVD, carotid bruits, or masses Cardiac: RRR; no murmurs, rubs, or gallops,no edema  Respiratory:  clear to auscultation bilaterally, normal work of breathing GI: soft, nontender, nondistended, + BS MS: no deformity or atrophy  Skin: warm and dry, no rash Neuro:  Strength and sensation are intact Psych: euthymic mood, full affect   Recent Labs: 04/18/2017: TSH 1.04 10/15/2017: ALT 14; BUN 16; Creatinine, Ser 0.86; Hemoglobin 14.7; Platelets 240.0; Potassium 4.2; Sodium 141    Lipid Panel Lab Results  Component Value Date   CHOL 213 (H) 10/15/2017   HDL 50.10 10/15/2017   LDLCALC 146 (H) 10/15/2017   TRIG 87.0 10/15/2017      Wt Readings from Last 3 Encounters:  11/12/17 183 lb 8 oz (83.2 kg)  10/03/17 185 lb (83.9 kg)  07/01/17 184 lb 3.2 oz (83.6 kg)       ASSESSMENT AND PLAN:  Chest pain, unspecified type -  Atypical in nature though having some symptoms with exertion (As well as at rest) Risk factors for coronary disease or hyperlipidemia. She has mild to  moderate aortic atherosclerosis seen on CT scan Long discussion with her concerning various treatment options Initially noncommittal to doing any testing and did not know what she wanted to do We have  recommended treadmill Myoview given she continues to worry about it, recently stopped her activities as she was having discomfort while working in her garden Husband who presents with her is in agreement He reports she is May tend to worry about her symptoms  Essential hypertension Blood pressure is well controlled on today's visit. No changes made to the medications.  Shortness of breath Recommended she restart her exercise program Deconditioned over the past 6 months, has not been going to the gym on a regular basis Likely causing symptoms that she had walking at the cheerleader competition  Mixed hyperlipidemia Agree with Crestor, goal LDL less than 70  Aortic atherosclerosis Mild-to-moderate on CT scan, images reviewed with her in detail Stressed importance of low-dose aspirin with aggressive lipid management  Disposition:   F/U  12 months as needed  Total encounter time more than 45 minutes  Greater than 50% was spent in counseling and coordination of care with the patient    Orders Placed This Encounter  Procedures  . NM Myocar Multi W/Spect W/Wall Motion / EF  . EKG 12-Lead     Signed, Esmond Plants, M.D., Ph.D. 11/12/2017  [place], [place]

## 2017-11-12 ENCOUNTER — Ambulatory Visit: Payer: Medicare HMO | Admitting: Cardiovascular Disease

## 2017-11-12 ENCOUNTER — Encounter: Payer: Self-pay | Admitting: Cardiovascular Disease

## 2017-11-12 VITALS — BP 128/68 | HR 71 | Ht 67.0 in | Wt 183.5 lb

## 2017-11-12 DIAGNOSIS — R0602 Shortness of breath: Secondary | ICD-10-CM

## 2017-11-12 DIAGNOSIS — E782 Mixed hyperlipidemia: Secondary | ICD-10-CM | POA: Diagnosis not present

## 2017-11-12 DIAGNOSIS — I700 Atherosclerosis of aorta: Secondary | ICD-10-CM | POA: Diagnosis not present

## 2017-11-12 DIAGNOSIS — R079 Chest pain, unspecified: Secondary | ICD-10-CM

## 2017-11-12 DIAGNOSIS — I10 Essential (primary) hypertension: Secondary | ICD-10-CM

## 2017-11-12 NOTE — Patient Instructions (Addendum)
Medication Instructions:   No medication changes made  Labwork:  No new labs needed  Testing/Procedures:  [place]  Your caregiver has ordered a Stress Test with nuclear imaging. The purpose of this test is to evaluate the blood supply to your heart muscle. This procedure is referred to as a "Non-Invasive Stress Test." This is because other than having an IV started in your vein, nothing is inserted or "invades" your body. Cardiac stress tests are done to find areas of poor blood flow to the heart by determining the extent of coronary artery disease (CAD). Some patients exercise on a treadmill, which naturally increases the blood flow to your heart, while others who are  unable to walk on a treadmill due to physical limitations have a pharmacologic/chemical stress agent called Lexiscan . This medicine will mimic walking on a treadmill by temporarily increasing your coronary blood flow.   Please note: these test may take anywhere between 2-4 hours to complete  PLEASE REPORT TO [facility] AT THE FIRST DESK WILL DIRECT YOU WHERE TO GO  Date of Procedure:_____________________________________  Arrival Time for Procedure:______________________________  PLEASE NOTIFY THE OFFICE AT LEAST 24 HOURS IN ADVANCE IF YOU ARE UNABLE TO KEEP YOUR APPOINTMENT.  [phone] AND  PLEASE NOTIFY NUCLEAR MEDICINE AT [facility] AT LEAST 24 HOURS IN ADVANCE IF YOU ARE UNABLE TO KEEP YOUR APPOINTMENT. [phone]  How to prepare for your Myoview test:  1. Do not eat or drink after midnight 2. No caffeine for 24 hours prior to test 3. No smoking 24 hours prior to test. 4. Your medication may be taken with water.  If your doctor stopped a medication because of this test, do not take that medication. 5. Ladies, please do not wear dresses.  Skirts or pants are appropriate. Please wear a short sleeve shirt. 6. No perfume, cologne or lotion. 7. Wear comfortable walking shoes. No  heels!   Follow-Up: It was a pleasure seeing you in the office today. Please call us if you have new issues that need to be addressed before your next appt.  [phone]  Your physician wants you to follow-up in: 12 months as needed.  You will receive a reminder letter in the mail two months in advance. If you don't receive a letter, please call our office to schedule the follow-up appointment.  If you need a refill on your cardiac medications before your next appointment, please call your pharmacy.  For educational health videos Log in to : [email or web address] Or : [email or web address], password : [email or web address] Cardiac Nuclear Scan A cardiac nuclear scan is a test that measures blood flow to the heart when a person is resting and when he or she is exercising. The test looks for problems such as:  Not enough blood reaching a portion of the heart.  The heart muscle not working normally.  You may need this test if:  You have heart disease.  You have had abnormal lab results.  You have had heart surgery or angioplasty.  You have chest pain.  You have shortness of breath.  In this test, a radioactive dye (tracer) is injected into your bloodstream. After the tracer has traveled to your heart, an imaging device is used to measure how much of the tracer is absorbed by or distributed to various areas of your heart. This procedure is usually done at a hospital and takes 2-4 hours. Tell a health care provider about:  Any allergies you have.  All medicines you  are taking, including vitamins, herbs, eye drops, creams, and over-the-counter medicines.  Any problems you or family members have had with the use of anesthetic medicines.  Any blood disorders you have.  Any surgeries you have had.  Any medical conditions you have.  Whether you are pregnant or may be pregnant. What are the risks? Generally, this is a safe procedure. However, problems may occur, including:  Serious chest pain and  heart attack. This is only a risk if the stress portion of the test is done.  Rapid heartbeat.  Sensation of warmth in your chest. This usually passes quickly.  What happens before the procedure?  Ask your health care provider about changing or stopping your regular medicines. This is especially important if you are taking diabetes medicines or blood thinners.  Remove your jewelry on the day of the procedure. What happens during the procedure?  An IV tube will be inserted into one of your veins.  Your health care provider will inject a small amount of radioactive tracer through the tube.  You will wait for 20-40 minutes while the tracer travels through your bloodstream.  Your heart activity will be monitored with an electrocardiogram (ECG).  You will lie down on an exam table.  Images of your heart will be taken for about 15-20 minutes.  You may be asked to exercise on a treadmill or stationary bike. While you exercise, your heart's activity will be monitored with an ECG, and your blood pressure will be checked. If you are unable to exercise, you may be given a medicine to increase blood flow to parts of your heart.  When blood flow to your heart has peaked, a tracer will again be injected through the IV tube.  After 20-40 minutes, you will get back on the exam table and have more images taken of your heart.  When the procedure is over, your IV tube will be removed. The procedure may vary among health care providers and hospitals. Depending on the type of tracer used, scans may need to be repeated 3-4 hours later. What happens after the procedure?  Unless your health care provider tells you otherwise, you may return to your normal schedule, including diet, activities, and medicines.  Unless your health care provider tells you otherwise, you may increase your fluid intake. This will help flush the contrast dye from your body. Drink enough fluid to keep your urine clear or pale  yellow.  It is up to you to get your test results. Ask your health care provider, or the department that is doing the test, when your results will be ready. Summary  A cardiac nuclear scan measures the blood flow to the heart when a person is resting and when he or she is exercising.  You may need this test if you are at risk for heart disease.  Tell your health care provider if you are pregnant.  Unless your health care provider tells you otherwise, increase your fluid intake. This will help flush the contrast dye from your body. Drink enough fluid to keep your urine clear or pale yellow. This information is not intended to replace advice given to you by your health care provider. Make sure you discuss any questions you have with your health care provider. Document Released: 09/20/2004 Document Revised: 08/28/2016 Document Reviewed: 08/04/2013 Elsevier Interactive Patient Education  2017 [employer].

## 2017-11-17 ENCOUNTER — Ambulatory Visit: Payer: Self-pay

## 2017-11-17 NOTE — Telephone Encounter (Signed)
Pt calling to schedule appt to be seen about sinusitis and white patches in mouth. Pt states she is post nasal drip and now is having mouth pain and white patches. Pt states pain is moderate. Pt states it involves the palate. She states she has had similar sx when she has had sinus infections before. Appt already made 11/18/17 with Delano Metz  Reason for Disposition . White patches that stick to tongue or inner cheek  Answer Assessment - Initial Assessment Questions 1. SYMPTOM: "What's the main symptom you're concerned about?" (e.g., dry mouth. chapped lips, lump) Upper palate is sore 2. ONSET: "When did the  ________  start?" Saturday 3. PAIN: "Is there any pain?" If so, ask: "How bad is it?" (Scale: 1-10; mild, moderate, severe)     moderate 4. CAUSE: "What do you think is causing the symptoms?"     Sinus infection 5. OTHER SYMPTOMS: "Do you have any other symptoms?" (e.g., fever, sore throat, toothache, swelling)     Sore throat, throat swelling  6. PREGNANCY: "Is there any chance you are pregnant?" "When was your last menstrual period?"     n/a  Protocols used: MOU[facility]

## 2017-11-18 ENCOUNTER — Encounter: Payer: Self-pay | Admitting: Family Medicine

## 2017-11-18 ENCOUNTER — Ambulatory Visit (INDEPENDENT_AMBULATORY_CARE_PROVIDER_SITE_OTHER): Payer: Medicare HMO | Admitting: Family Medicine

## 2017-11-18 VITALS — BP 138/58 | HR 68 | Temp 97.8°F | Wt 183.6 lb

## 2017-11-18 DIAGNOSIS — H66002 Acute suppurative otitis media without spontaneous rupture of ear drum, left ear: Secondary | ICD-10-CM

## 2017-11-18 MED ORDER — AMOXICILLIN-POT CLAVULANATE 875-125 MG PO TABS: 1.0000 | ORAL_TABLET | Freq: Two times a day (BID) | ORAL | 0 refills | Status: DC

## 2017-11-18 NOTE — Progress Notes (Signed)
Patient ID: Monica Larson, female   DOB: 12-18-1945, 72 y.o.   MRN: [id number]  PCP: Leone Haven, MD  Subjective:  Monica Larson is a 72 y.o. year old very pleasant female patient who presents with symptoms including nasal congestion, sore throat, post nasal drip, cough that is mildly productive of thick mucous -started: 3 to 4 days, symptoms are not improving  -previous treatments: Mucinex DM, Alka Seltzer cold, and saline rinses have provided limited benefit -sick contacts/travel/risks: denies flu exposure. Recent sick contact exposure with grandchildren. Influenza vaccine is UTD -Hx of: allergies  ROS-denies fever, SOB, NVD, tooth pain  Pertinent Past Medical History- HTN, Aortic atherosclerosis  Medications- reviewed  Current Outpatient Medications  Medication Sig Dispense Refill  . aspirin (ASPIR-LOW) 81 MG EC tablet Take 81 mg by mouth daily.     . diphenhydrAMINE (BENADRYL) 25 MG tablet Take 25 mg by mouth daily as needed for itching, allergies or sleep.    Marland Kitchen lisinopril (PRINIVIL,ZESTRIL) 30 MG tablet Take 1 tablet (30 mg total) by mouth daily. 90 tablet 1  . Multiple Vitamins-Minerals (MULTIVITAMIN WITH MINERALS) tablet Take 1 tablet by mouth daily.     . Omega-3 Fatty Acids (OMEGA-3 FISH OIL PO) Take by mouth.    Marland Kitchen omeprazole (PRILOSEC) 20 MG capsule Take 1 capsule (20 mg total) by mouth daily as needed. For acid reflux 90 capsule 1  . oxybutynin (DITROPAN) 5 MG tablet Take 2 tablets (10 mg total) by mouth daily. 180 tablet 3  . rosuvastatin (CRESTOR) 20 MG tablet Take 1 tablet (20 mg total) by mouth daily. 90 tablet 3  . traZODone (DESYREL) 150 MG tablet Take 1 tablet (150 mg total) by mouth at bedtime. 90 tablet 3   No current facility-administered medications for this visit.     Objective: BP (!) 138/58 (BP Location: Right Arm, Patient Position: Sitting, Cuff Size: Normal)   Pulse 68   Temp 97.8 F (36.6 C) (Oral)   Wt 183 lb 9.6 oz (83.3 kg)   BMI 28.76 kg/m  Gen:  NAD, resting comfortably HEENT: Turbinates erythematous, Right TM normal, Left TM with erythema and mildly bulging, pharynx mildly erythematous with no tonsilar exudate or edema, + sinus tenderness CV: RRR no murmurs rubs or gallops Lungs: CTAB no crackles, wheeze, rhonchi Abdomen: soft/nontender/nondistended/normal bowel sounds. No rebound or guarding.  Ext: no edema Skin: warm, dry, no rash Neuro: grossly normal, moves all extremities  Assessment/Plan: 1. Acute suppurative otitis media of left ear without spontaneous rupture of tympanic membrane, recurrence not specified Exam and history support treatment for AOM. Advised patient on supportive measures:  Get rest, drink plenty of fluids, and she can add either Allegra, Claritin, or Zyrtec for symptom of post nasal drip. Follow up if fever >101, if symptoms worsen or if symptoms are not improved in 3 to 4 days. Patient verbalizes understanding.   - amoxicillin-clavulanate (AUGMENTIN) 875-125 MG tablet; Take 1 tablet by mouth 2 (two) times daily.  Dispense: 14 tablet; Refill: 0  Finally, we reviewed reasons to return to care including if symptoms worsen or persist or new concerns arise- once again particularly shortness of breath or fever.    Laurita Quint, FNP

## 2017-11-18 NOTE — Patient Instructions (Signed)
Please take medication as directed with food.  Please drink plenty of water so that your urine is pale yellow or clear. Also, get plenty of rest and add either Allegra, Claritin, or Zyrtec for post nasal drip. Follow up if symptoms do not  not improve in 3 to 4 days, worsen, or you develop a fever >101.   Otitis Media, Adult Otitis media is redness, soreness, and puffiness (swelling) in the space just behind your eardrum (middle ear). It may be caused by allergies or infection. It often happens along with a cold. Follow these instructions at home:  Take your medicine as told. Finish it even if you start to feel better.  Only take over-the-counter or prescription medicines for pain, discomfort, or fever as told by your doctor.  Follow up with your doctor as told. Contact a doctor if:  You have otitis media only in one ear, or bleeding from your nose, or both.  You notice a lump on your neck.  You are not getting better in 3-5 days.  You feel worse instead of better. Get help right away if:  You have pain that is not helped with medicine.  You have puffiness, redness, or pain around your ear.  You get a stiff neck.  You cannot move part of your face (paralysis).  You notice that the bone behind your ear hurts when you touch it. This information is not intended to replace advice given to you by your health care provider. Make sure you discuss any questions you have with your health care provider. Document Released: 02/12/2008 Document Revised: 02/01/2016 Document Reviewed: 03/23/2013 Elsevier Interactive Patient Education  2017 [employer].

## 2017-11-25 ENCOUNTER — Telehealth: Payer: Self-pay | Admitting: Family Medicine

## 2017-11-25 DIAGNOSIS — R0982 Postnasal drip: Secondary | ICD-10-CM | POA: Diagnosis not present

## 2017-11-25 DIAGNOSIS — F458 Other somatoform disorders: Secondary | ICD-10-CM | POA: Diagnosis not present

## 2017-11-25 DIAGNOSIS — J309 Allergic rhinitis, unspecified: Secondary | ICD-10-CM | POA: Diagnosis not present

## 2017-11-25 NOTE — Telephone Encounter (Signed)
Please contact the patient and get more details.  What allergy does she have?  Why does she need to switch blood pressure medications?  I have not received a message from Dr. Tami Ribas at this time.

## 2017-11-25 NOTE — Telephone Encounter (Signed)
Please advise 

## 2017-11-25 NOTE — Telephone Encounter (Signed)
Copied from [place]. Topic: Quick Communication - See Telephone Encounter >> Nov 25, 2017  4:14 PM Neva Seat wrote: Pt needing to discuss a different BP due to allergies. Dr. Tami Ribas - sent message to Dr. Caryl Bis to request the change for pt.  Currnetly on:  lisinopril (PRINIVIL,ZESTRIL) 30 MG tablet  Please call pt to advise.

## 2017-11-26 NOTE — Telephone Encounter (Signed)
Left message to return call, ok for pec to speak to patient to get more details 

## 2017-11-27 NOTE — Telephone Encounter (Signed)
Pt returning call regarding Dr. Tharon Aquas message. Please call back [phone]

## 2017-11-27 NOTE — Telephone Encounter (Signed)
fyi

## 2017-11-27 NOTE — Telephone Encounter (Signed)
Pt. Called back - states she saw Dr. Tami Ribas this week and he was the one who told her she "probably should not be on Lisinopril because of her constant coughing." He has put her on Prednisone and she "feels a little better with the cough and congestion." Will be having allergy testing with in the next 2 weeks. Has an appointment with Dr. Caryl Bis 12/31/17. Please call her cell phone for any instructions.

## 2017-11-27 NOTE — Telephone Encounter (Signed)
Please advise 

## 2017-11-27 NOTE — Telephone Encounter (Signed)
Spoke  With  Patient    Regarding  Dr  Ellen Henri  Recommendation  Regarding switching to  Another  Angiotensin  Blocker  She  Is  Agreeable  To  The  Change  Blood  Draw  Scheduled   Pharmacy  [place]

## 2017-11-27 NOTE — Telephone Encounter (Signed)
Left message to return call to the office.

## 2017-11-27 NOTE — Telephone Encounter (Signed)
Noted.  We could consider switching her to 1 of the angiotensin receptor blockers that function somewhat similarly to lisinopril though is not associated with cough.  If she is willing to make this change I can send this in for her.  She would need to have repeat blood work about a week after starting the new medication.  Thanks.

## 2017-11-27 NOTE — Telephone Encounter (Signed)
Left message to return call. [place] for pec to speak to patient about message below

## 2017-11-29 MED ORDER — OLMESARTAN MEDOXOMIL 20 MG PO TABS: 20.0000 mg | ORAL_TABLET | Freq: Every day | ORAL | 3 refills | Status: DC

## 2017-11-29 NOTE — Telephone Encounter (Signed)
Sent to pharmacy.  Please make sure she picked this up and discontinued the lisinopril.  Thanks.

## 2017-11-29 NOTE — Addendum Note (Signed)
Addended by: Caryl Bis Tod Abrahamsen G on: 11/29/2017 11:26 AM   Modules accepted: Orders

## 2017-12-01 ENCOUNTER — Telehealth: Payer: Self-pay | Admitting: Family Medicine

## 2017-12-01 NOTE — Telephone Encounter (Signed)
Patient states she will pick it up today and discontinue the lisinopril once she start the benicar

## 2017-12-01 NOTE — Telephone Encounter (Signed)
Copied from [place]. Topic: Quick Communication - See Telephone Encounter >> Dec 01, 2017  3:55 PM Vernona Rieger wrote: CRM for notification. See Telephone encounter for: 12/01/17.  Patient said that all the pharmacy's do not have olmesartan (BENICAR) 20 MG tablet, it is on back order. She is asking for something else in place of it.  Call back is [phone], pt is at [facility] now and needs to start something today.

## 2017-12-01 NOTE — Telephone Encounter (Signed)
Please advise 

## 2017-12-02 DIAGNOSIS — J301 Allergic rhinitis due to pollen: Secondary | ICD-10-CM | POA: Diagnosis not present

## 2017-12-02 MED ORDER — LOSARTAN POTASSIUM 50 MG PO TABS: 50.0000 mg | ORAL_TABLET | Freq: Every day | ORAL | 3 refills | Status: DC

## 2017-12-02 NOTE — Telephone Encounter (Signed)
Pt has contacted other pharmacies and they are backordered for olmesartan. She is calling to check status of RX. Call back [phone].

## 2017-12-02 NOTE — Telephone Encounter (Signed)
Losartan sent to [facility]. Thanks.

## 2017-12-02 NOTE — Telephone Encounter (Signed)
Please advise 

## 2017-12-02 NOTE — Telephone Encounter (Signed)
Pt calling to check status on another medication being called in to her pharmacy.

## 2017-12-03 ENCOUNTER — Ambulatory Visit (INDEPENDENT_AMBULATORY_CARE_PROVIDER_SITE_OTHER): Payer: Medicare HMO | Admitting: Primary Care

## 2017-12-03 ENCOUNTER — Encounter: Payer: Self-pay | Admitting: Primary Care

## 2017-12-03 VITALS — BP 124/64 | HR 94 | Temp 98.0°F | Ht 67.0 in | Wt 184.2 lb

## 2017-12-03 DIAGNOSIS — R300 Dysuria: Secondary | ICD-10-CM

## 2017-12-03 DIAGNOSIS — N898 Other specified noninflammatory disorders of vagina: Secondary | ICD-10-CM | POA: Diagnosis not present

## 2017-12-03 LAB — POC URINALSYSI DIPSTICK (AUTOMATED)
BILIRUBIN UA: NEGATIVE
GLUCOSE UA: NEGATIVE
Ketones, UA: NEGATIVE
LEUKOCYTES UA: NEGATIVE
NITRITE UA: NEGATIVE
Protein, UA: NEGATIVE
Spec Grav, UA: 1.025 (ref 1.010–1.025)
UROBILINOGEN UA: 0.2 U/dL
pH, UA: 5.5 (ref 5.0–8.0)

## 2017-12-03 NOTE — Progress Notes (Signed)
Subjective:    Patient ID: Monica Larson, female    DOB: 1945-12-02, 72 y.o.   MRN: [id number]  HPI  Monica Larson is a 72 year old female with a history of urge incontinence, UTI who presents today with a chief complaint of dysuria. She self catheterizes every other day on average and is following with Urology.   She also reports urinary frequency, pelvic pressure, vaginal discharge, vaginal itching. Her discharge is yellow. She was treated on 11/18/17 for an acute otitis media with Augmentin for a 7 day course. She finished her Augmentin about one week ago. Her urinary symptoms began about one week ago. She's also taken Cipro once daily for the past 2 days with some improvement in dysuria. She was put on a 7 day course of prednisone last week for continued congestion and cough.   She denies hematuria, fevers, nausea, vomiting.   Review of Systems  Constitutional: Negative for fever.  Gastrointestinal: Negative for nausea and vomiting.  Genitourinary: Positive for dysuria and vaginal discharge.       Vaginal itching, pelvic pressure       Past Medical History:  Diagnosis Date  . Arthritis    HANDS/NECK/SHOULDERS  . Asthma    SEASONAL  . Diarrhea    INTERMITTENT CHRONIC  . Fatigue   . GERD (gastroesophageal reflux disease)   . Headache    SINUS/ ALLERGIES  . HLD (hyperlipidemia)    borderline  . HTN (hypertension)    CONTROLLED ON MEDS  . Motion sickness   . Seasonal allergies   . Shortness of breath dyspnea    ON EXERTION     Social History   Socioeconomic History  . Marital status: Married    Spouse name: Not on file  . Number of children: Not on file  . Years of education: Not on file  . Highest education level: Not on file  Occupational History  . Not on file  Social Needs  . Financial resource strain: Not on file  . Food insecurity:    Worry: Not on file    Inability: Not on file  . Transportation needs:    Medical: Not on file    Non-medical: Not on file    Tobacco Use  . Smoking status: Never Smoker  . Smokeless tobacco: Never Used  . Tobacco comment: 1 cig every 3 months   Substance and Sexual Activity  . Alcohol use: Yes    Comment: 1 MIXED DRINK/WEEK  . Drug use: No  . Sexual activity: Not on file  Lifestyle  . Physical activity:    Days per week: Not on file    Minutes per session: Not on file  . Stress: Not on file  Relationships  . Social connections:    Talks on phone: Not on file    Gets together: Not on file    Attends religious service: Not on file    Active member of club or organization: Not on file    Attends meetings of clubs or organizations: Not on file    Relationship status: Not on file  . Intimate partner violence:    Fear of current or ex partner: Not on file    Emotionally abused: Not on file    Physically abused: Not on file    Forced sexual activity: Not on file  Other Topics Concern  . 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     Past  Surgical History:  Procedure Laterality Date  . BREAST BIOPSY Bilateral    NEG  . BREAST EXCISIONAL BIOPSY Bilateral    NEG  . CARPAL TUNNEL RELEASE     right wrist  . CHOLECYSTECTOMY    . COLONOSCOPY N/A 01/31/2016   Procedure: COLONOSCOPY;  Surgeon: Hulen Luster, MD;  Location: [facility];  Service: Gastroenterology;  Laterality: N/A;  . EYE SURGERY     muscular as child/ CATARACT BILATERAL  . SKIN CANCER EXCISION Right Wrist and thigh (x2)   Unsure of dates and Dermatologist  . VESICOVAGINAL FISTULA CLOSURE W/ TAH      Family History  Problem Relation Age of Onset  . Ovarian cancer Mother   . Prostate cancer Father   . Coronary artery disease Father   . Diabetes Father   . Heart attack Father   . Heart attack Brother   . Breast cancer Neg Hx     Allergies  Allergen Reactions  . Shellfish Allergy   . Sulfa Antibiotics     hives  . Sulfonamide Derivatives     Current Outpatient Medications  on File Prior to Visit  Medication Sig Dispense Refill  . amoxicillin-clavulanate (AUGMENTIN) 875-125 MG tablet Take 1 tablet by mouth 2 (two) times daily. 14 tablet 0  . aspirin (ASPIR-LOW) 81 MG EC tablet Take 81 mg by mouth daily.     . diphenhydrAMINE (BENADRYL) 25 MG tablet Take 25 mg by mouth daily as needed for itching, allergies or sleep.    . Multiple Vitamins-Minerals (MULTIVITAMIN WITH MINERALS) tablet Take 1 tablet by mouth daily.     . Omega-3 Fatty Acids (OMEGA-3 FISH OIL PO) Take by mouth.    Marland Kitchen omeprazole (PRILOSEC) 20 MG capsule Take 1 capsule (20 mg total) by mouth daily as needed. For acid reflux 90 capsule 1  . oxybutynin (DITROPAN) 5 MG tablet Take 2 tablets (10 mg total) by mouth daily. 180 tablet 3  . rosuvastatin (CRESTOR) 20 MG tablet Take 1 tablet (20 mg total) by mouth daily. 90 tablet 3  . traZODone (DESYREL) 150 MG tablet Take 1 tablet (150 mg total) by mouth at bedtime. 90 tablet 3  . losartan (COZAAR) 50 MG tablet Take 1 tablet (50 mg total) by mouth daily. (Patient not taking: Reported on 12/03/2017) 90 tablet 3   No current facility-administered medications on file prior to visit.     BP 124/64   Pulse 94   Temp 98 F (36.7 C) (Oral)   Ht 5\' 7"  (1.702 m)   Wt 184 lb 4 oz (83.6 kg)   SpO2 96%   BMI 28.86 kg/m    Objective:   Physical Exam  Constitutional: She appears well-nourished.  Neck: Neck supple.  Cardiovascular: Normal rate and regular rhythm.  Pulmonary/Chest: Effort normal and breath sounds normal.  Abdominal: Soft. Bowel sounds are normal. There is no tenderness. There is no CVA tenderness.  Skin: Skin is warm and dry.          Assessment & Plan:  Dysuria:  Also with urinary frequency, vaginal itching/discharge. Very likely she could have vaginal yeast infection given recent antibiotic use. UA: Negative for leuks and nitrites, trace blood. Wet prep completed and set off. Urine culture sent off given history. Doesn't present to  have renal stones or acute pyelonephritis.   She has an appointment with her Urologist next week.  Pleas Koch, NP

## 2017-12-03 NOTE — Patient Instructions (Signed)
We will notify you of your vaginal and urine specimens once received.  Continue to drink plenty of water.  Please follow up with your Urologist as scheduled.  It was a pleasure meeting you!

## 2017-12-04 ENCOUNTER — Other Ambulatory Visit: Payer: Self-pay | Admitting: Primary Care

## 2017-12-04 DIAGNOSIS — B379 Candidiasis, unspecified: Secondary | ICD-10-CM

## 2017-12-04 DIAGNOSIS — T3695XA Adverse effect of unspecified systemic antibiotic, initial encounter: Principal | ICD-10-CM

## 2017-12-04 LAB — URINE CULTURE
MICRO NUMBER:: 90382636
SPECIMEN QUALITY:: ADEQUATE

## 2017-12-04 LAB — WET PREP BY MOLECULAR PROBE
Candida species: DETECTED — AB
GARDNERELLA VAGINALIS: NOT DETECTED
MICRO NUMBER: 90382637
SPECIMEN QUALITY: ADEQUATE
Trichomonas vaginosis: NOT DETECTED

## 2017-12-04 MED ORDER — FLUCONAZOLE 150 MG PO TABS: 150.0000 mg | ORAL_TABLET | Freq: Once | ORAL | 0 refills | Status: AC

## 2017-12-09 DIAGNOSIS — R339 Retention of urine, unspecified: Secondary | ICD-10-CM | POA: Diagnosis not present

## 2017-12-09 DIAGNOSIS — Z7982 Long term (current) use of aspirin: Secondary | ICD-10-CM | POA: Diagnosis not present

## 2017-12-09 DIAGNOSIS — N3582 Other urethral stricture, female: Secondary | ICD-10-CM | POA: Diagnosis not present

## 2017-12-09 DIAGNOSIS — N3946 Mixed incontinence: Secondary | ICD-10-CM | POA: Diagnosis not present

## 2017-12-09 DIAGNOSIS — Z87440 Personal history of urinary (tract) infections: Secondary | ICD-10-CM | POA: Diagnosis not present

## 2017-12-09 DIAGNOSIS — J329 Chronic sinusitis, unspecified: Secondary | ICD-10-CM | POA: Diagnosis not present

## 2017-12-09 DIAGNOSIS — N3941 Urge incontinence: Secondary | ICD-10-CM | POA: Diagnosis not present

## 2017-12-09 DIAGNOSIS — Z79899 Other long term (current) drug therapy: Secondary | ICD-10-CM | POA: Diagnosis not present

## 2017-12-09 DIAGNOSIS — J45909 Unspecified asthma, uncomplicated: Secondary | ICD-10-CM | POA: Diagnosis not present

## 2017-12-09 DIAGNOSIS — Z882 Allergy status to sulfonamides status: Secondary | ICD-10-CM | POA: Diagnosis not present

## 2017-12-10 ENCOUNTER — Other Ambulatory Visit (INDEPENDENT_AMBULATORY_CARE_PROVIDER_SITE_OTHER): Payer: Medicare HMO

## 2017-12-10 ENCOUNTER — Emergency Department
Admission: EM | Admit: 2017-12-10 | Discharge: 2017-12-10 | Disposition: A | Discharge status: Home / Self Care | Payer: Medicare HMO | Attending: Student in an Organized Health Care Education/Training Program | Admitting: Student in an Organized Health Care Education/Training Program

## 2017-12-10 ENCOUNTER — Encounter: Payer: Self-pay | Admitting: Emergency Medicine

## 2017-12-10 ENCOUNTER — Emergency Department: Payer: Medicare HMO

## 2017-12-10 ENCOUNTER — Ambulatory Visit: Payer: Self-pay

## 2017-12-10 ENCOUNTER — Other Ambulatory Visit: Payer: Self-pay

## 2017-12-10 DIAGNOSIS — I10 Essential (primary) hypertension: Secondary | ICD-10-CM | POA: Insufficient documentation

## 2017-12-10 DIAGNOSIS — G510 Bell's palsy: Secondary | ICD-10-CM

## 2017-12-10 DIAGNOSIS — Z85828 Personal history of other malignant neoplasm of skin: Secondary | ICD-10-CM | POA: Insufficient documentation

## 2017-12-10 DIAGNOSIS — Z79899 Other long term (current) drug therapy: Secondary | ICD-10-CM | POA: Diagnosis not present

## 2017-12-10 DIAGNOSIS — J45909 Unspecified asthma, uncomplicated: Secondary | ICD-10-CM | POA: Insufficient documentation

## 2017-12-10 DIAGNOSIS — E785 Hyperlipidemia, unspecified: Secondary | ICD-10-CM | POA: Diagnosis not present

## 2017-12-10 DIAGNOSIS — R29810 Facial weakness: Secondary | ICD-10-CM | POA: Diagnosis not present

## 2017-12-10 DIAGNOSIS — I700 Atherosclerosis of aorta: Secondary | ICD-10-CM | POA: Diagnosis not present

## 2017-12-10 DIAGNOSIS — R200 Anesthesia of skin: Secondary | ICD-10-CM | POA: Insufficient documentation

## 2017-12-10 DIAGNOSIS — R202 Paresthesia of skin: Secondary | ICD-10-CM | POA: Diagnosis not present

## 2017-12-10 DIAGNOSIS — N200 Calculus of kidney: Secondary | ICD-10-CM | POA: Diagnosis not present

## 2017-12-10 LAB — APTT: aPTT: 27 seconds (ref 24–36)

## 2017-12-10 LAB — DIFFERENTIAL
Basophils Absolute: 0.1 10*3/uL (ref 0–0.1)
Basophils Relative: 1 %
Eosinophils Absolute: 0.3 10*3/uL (ref 0–0.7)
Eosinophils Relative: 3 %
LYMPHS ABS: 2.7 10*3/uL (ref 1.0–3.6)
LYMPHS PCT: 32 %
Monocytes Absolute: 0.8 10*3/uL (ref 0.2–0.9)
Monocytes Relative: 9 %
NEUTROS PCT: 55 %
Neutro Abs: 4.9 10*3/uL (ref 1.4–6.5)

## 2017-12-10 LAB — CBC
HCT: 44.1 % (ref 35.0–47.0)
Hemoglobin: 14.6 g/dL (ref 12.0–16.0)
MCH: 30.8 pg (ref 26.0–34.0)
MCHC: 33 g/dL (ref 32.0–36.0)
MCV: 93.1 fL (ref 80.0–100.0)
Platelets: 263 K/uL (ref 150–440)
RBC: 4.74 MIL/uL (ref 3.80–5.20)
RDW: 13 % (ref 11.5–14.5)
WBC: 8.7 K/uL (ref 3.6–11.0)

## 2017-12-10 LAB — HEPATIC FUNCTION PANEL
ALT: 18 U/L (ref 0–35)
AST: 16 U/L (ref 0–37)
Albumin: 4 g/dL (ref 3.5–5.2)
Alkaline Phosphatase: 67 U/L (ref 39–117)
BILIRUBIN DIRECT: 0.2 mg/dL (ref 0.0–0.3)
BILIRUBIN TOTAL: 0.6 mg/dL (ref 0.2–1.2)
TOTAL PROTEIN: 7.2 g/dL (ref 6.0–8.3)

## 2017-12-10 LAB — COMPREHENSIVE METABOLIC PANEL WITH GFR
ALT: 18 U/L (ref 14–54)
AST: 23 U/L (ref 15–41)
Albumin: 4.2 g/dL (ref 3.5–5.0)
Alkaline Phosphatase: 69 U/L (ref 38–126)
Anion gap: 8 (ref 5–15)
BUN: 19 mg/dL (ref 6–20)
CO2: 26 mmol/L (ref 22–32)
Calcium: 9.2 mg/dL (ref 8.9–10.3)
Chloride: 104 mmol/L (ref 101–111)
Creatinine, Ser: 0.95 mg/dL (ref 0.44–1.00)
GFR calc Af Amer: >60 mL/min (ref >60)
GFR calc non Af Amer: 58 mL/min — ABNORMAL LOW (ref >60)
Glucose, Bld: 159 mg/dL — ABNORMAL HIGH (ref 65–99)
Potassium: 4 mmol/L (ref 3.5–5.1)
Sodium: 138 mmol/L (ref 135–145)
Total Bilirubin: 0.5 mg/dL (ref 0.3–1.2)
Total Protein: 7.3 g/dL (ref 6.5–8.1)

## 2017-12-10 LAB — PROTIME-INR
INR: 0.96
PROTHROMBIN TIME: 12.7 s (ref 11.4–15.2)

## 2017-12-10 LAB — TROPONIN I: Troponin I: <0.03 ng/mL (ref <0.03)

## 2017-12-10 LAB — LDL CHOLESTEROL, DIRECT: Direct LDL: 72 mg/dL

## 2017-12-10 MED ORDER — VALACYCLOVIR HCL 500 MG PO TABS
1000.0000 mg | ORAL_TABLET | Freq: Once | ORAL | Status: AC
  Administered 2017-12-10: 1000 mg via ORAL
  Filled 2017-12-10: qty 2

## 2017-12-10 MED ORDER — PREDNISONE 20 MG PO TABS
60.0000 mg | ORAL_TABLET | Freq: Once | ORAL | Status: AC
  Administered 2017-12-10: 60 mg via ORAL
  Filled 2017-12-10: qty 3

## 2017-12-10 MED ORDER — VALACYCLOVIR HCL 1 G PO TABS: 1000.0000 mg | ORAL_TABLET | Freq: Two times a day (BID) | ORAL | 0 refills | Status: AC

## 2017-12-10 MED ORDER — PREDNISONE 10 MG PO TABS: 10.0000 mg | ORAL_TABLET | Freq: Every day | ORAL | 0 refills | Status: DC

## 2017-12-10 MED ORDER — ASPIRIN 81 MG PO CHEW
324.0000 mg | CHEWABLE_TABLET | Freq: Once | ORAL | Status: AC
  Administered 2017-12-10: 324 mg via ORAL
  Filled 2017-12-10: qty 4

## 2017-12-10 NOTE — ED Provider Notes (Addendum)
[facility] Emergency Department Provider Note    First MD Initiated Contact with Patient 12/10/17 1755     (approximate)  I have reviewed the triage vital signs and the nursing notes.   HISTORY  Chief Complaint Facial Droop    HPI Monica Larson is a 72 y.o. female a history of hypertension does not smoke presents with chief complaint of right facial droop right sided facial numbness and right upper extremity numbness.  States that she first started feeling symptoms at 8 AM this morning and felt that she could not pucker her lips when she was applying lipstick.  States the symptoms progressively worsened throughout the day.  States that she has had progressively more numbness in her right hand.  No slurred speech no headache.  No recent fevers.  No chest pain or shortness of breath.  No neck pain.  Is never had symptoms similar to this in the past.  Past Medical History:  Diagnosis Date  . Arthritis    HANDS/NECK/SHOULDERS  . Asthma    SEASONAL  . Diarrhea    INTERMITTENT CHRONIC  . Fatigue   . GERD (gastroesophageal reflux disease)   . Headache    SINUS/ ALLERGIES  . HLD (hyperlipidemia)    borderline  . HTN (hypertension)    CONTROLLED ON MEDS  . Motion sickness   . Seasonal allergies   . Shortness of breath dyspnea    ON EXERTION   Family History  Problem Relation Age of Onset  . Ovarian cancer Mother   . Prostate cancer Father   . Coronary artery disease Father   . Diabetes Father   . Heart attack Father   . Heart attack Brother   . Breast cancer Neg Hx    Past Surgical History:  Procedure Laterality Date  . BREAST BIOPSY Bilateral    NEG  . BREAST EXCISIONAL BIOPSY Bilateral    NEG  . CARPAL TUNNEL RELEASE     right wrist  . CHOLECYSTECTOMY    . COLONOSCOPY N/A 01/31/2016   Procedure: COLONOSCOPY;  Surgeon: Hulen Luster, MD;  Location: [facility];  Service: Gastroenterology;  Laterality: N/A;  . EYE SURGERY     muscular  as child/ CATARACT BILATERAL  . SKIN CANCER EXCISION Right Wrist and thigh (x2)   Unsure of dates and Dermatologist  . [place] W/ TAH     Patient Active Problem List   Diagnosis Date Noted  . Aortic atherosclerosis ([place]) 11/12/2017  . Mixed hyperlipidemia 11/12/2017  . Right knee pain 01/10/2017  . Piriformis syndrome of right side 01/10/2017  . H/O motion sickness 06/17/2016  . Chest pain 05/31/2016  . Breast lesion 05/31/2016  . Diarrhea 01/10/2016  . Postmenopausal estrogen deficiency 04/10/2015  . Leg cramps 03/23/2015  . Adjustment disorder with mixed anxiety and depressed mood 03/23/2015  . Basal cell carcinoma 07/28/2014  . Urge incontinence 10/25/2013  . Overweight (BMI 25.0-29.9) 07/20/2013  . Insomnia 07/20/2013  . Hypertension 05/14/2011  . Dyspnea 10/04/2010      Prior to Admission medications   Medication Sig Start Date End Date Taking? Authorizing Provider  aspirin (ASPIR-LOW) 81 MG EC tablet Take 81 mg by mouth daily.    Yes [provider]  diphenhydrAMINE (BENADRYL) 25 MG tablet Take 25 mg by mouth daily as needed for itching, allergies or sleep.   Yes [provider]  imipramine (TOFRANIL) 25 MG tablet Take 25 mg by mouth every evening. 12/09/17  Yes [provider]  losartan (COZAAR) 50 MG tablet Take 1 tablet (50 mg total) by mouth daily. 12/02/17  Yes Leone Haven, MD  Multiple Vitamins-Minerals (MULTIVITAMIN WITH MINERALS) tablet Take 1 tablet by mouth daily.    Yes [provider]  Omega-3 Fatty Acids (OMEGA-3 FISH OIL PO) Take 1 capsule by mouth daily.    Yes [provider]  omeprazole (PRILOSEC) 20 MG capsule Take 1 capsule (20 mg total) by mouth daily as needed. For acid reflux 10/03/17  Yes Leone Haven, MD  oxybutynin (DITROPAN) 5 MG tablet Take 2 tablets (10 mg total) by mouth daily. 10/03/17  Yes Leone Haven, MD  rosuvastatin (CRESTOR) 20 MG tablet Take 1 tablet (20 mg  total) by mouth daily. 10/15/17  Yes Leone Haven, MD  traZODone (DESYREL) 150 MG tablet Take 1 tablet (150 mg total) by mouth at bedtime. 10/03/17  Yes Leone Haven, MD  amoxicillin-clavulanate (AUGMENTIN) 875-125 MG tablet Take 1 tablet by mouth 2 (two) times daily. Patient not taking: Reported on 12/10/2017 11/18/17   Delano Metz, FNP    Allergies Shellfish allergy; Sulfa antibiotics; and Sulfonamide derivatives    Social History Social History   Tobacco Use  . Smoking status: Never Smoker  . Smokeless tobacco: Never Used  . Tobacco comment: 1 cig every 3 months   Substance Use Topics  . Alcohol use: Yes    Comment: 1 MIXED DRINK/WEEK  . Drug use: No    Review of Systems Patient denies headaches, rhinorrhea, blurry vision, numbness, shortness of breath, chest pain, edema, cough, abdominal pain, nausea, vomiting, diarrhea, dysuria, fevers, rashes or hallucinations unless otherwise stated above in HPI. ____________________________________________   PHYSICAL EXAM:  VITAL SIGNS: Vitals:   12/10/17 1830 12/10/17 1900  BP: 137/68 131/68  Pulse: 77 73  Resp: (!) 24 17  Temp:    SpO2: 100% 99%    Constitutional: Alert and oriented. Well appearing and in no acute distress. Eyes: Conjunctivae are normal.  Head: Atraumatic. Nose: No congestion/rhinnorhea. Mouth/Throat: Mucous membranes are moist.   Neck: No stridor. Painless ROM.  Cardiovascular: Normal rate, regular rhythm. Grossly normal heart sounds.  Good peripheral circulation. Respiratory: Normal respiratory effort.  No retractions. Lungs CTAB. Gastrointestinal: Soft and nontender. No distention. No abdominal bruits. No CVA tenderness. Genitourinary:  Musculoskeletal: No lower extremity tenderness nor edema.  No joint effusions. Neurologic: + right facial droop sparing the forehead. Otherwise CN intact.  No dritft., Normal FNF.  Normal heel to shin.  Sensation intact bilaterally. Normal speech and  language.  Skin:  Skin is warm, dry and intact. No rash noted. Psychiatric: Mood and affect are normal. Speech and behavior are normal.  ____________________________________________   LABS (all labs ordered are listed, but only abnormal results are displayed)  Results for orders placed or performed during the hospital encounter of 12/10/17 (from the past 24 hour(s))  Protime-INR     Status: None   Collection Time: 12/10/17  5:50 PM  Result Value Ref Range   Prothrombin Time 12.7 11.4 - 15.2 seconds   INR 0.96   APTT     Status: None   Collection Time: 12/10/17  5:50 PM  Result Value Ref Range   aPTT 27 24 - 36 seconds  CBC     Status: None   Collection Time: 12/10/17  5:50 PM  Result Value Ref Range   WBC 8.7 3.6 - 11.0 K/uL   RBC 4.74 3.80 - 5.20 MIL/uL  Hemoglobin 14.6 12.0 - 16.0 g/dL   HCT 44.1 35.0 - 47.0 %   MCV 93.1 80.0 - 100.0 fL   MCH 30.8 26.0 - 34.0 pg   MCHC 33.0 32.0 - 36.0 g/dL   RDW 13.0 11.5 - 14.5 %   Platelets 263 150 - 440 K/uL  Differential     Status: None   Collection Time: 12/10/17  5:50 PM  Result Value Ref Range   Neutrophils Relative % 55 %   Neutro Abs 4.9 1.4 - 6.5 K/uL   Lymphocytes Relative 32 %   Lymphs Abs 2.7 1.0 - 3.6 K/uL   Monocytes Relative 9 %   Monocytes Absolute 0.8 0.2 - 0.9 K/uL   Eosinophils Relative 3 %   Eosinophils Absolute 0.3 0 - 0.7 K/uL   Basophils Relative 1 %   Basophils Absolute 0.1 0 - 0.1 K/uL  Comprehensive metabolic panel     Status: Abnormal   Collection Time: 12/10/17  5:50 PM  Result Value Ref Range   Sodium 138 135 - 145 mmol/L   Potassium 4.0 3.5 - 5.1 mmol/L   Chloride 104 101 - 111 mmol/L   CO2 26 22 - 32 mmol/L   Glucose, Bld 159 (H) 65 - 99 mg/dL   BUN 19 6 - 20 mg/dL   Creatinine, Ser 0.95 0.44 - 1.00 mg/dL   Calcium 9.2 8.9 - 10.3 mg/dL   Total Protein 7.3 6.5 - 8.1 g/dL   Albumin 4.2 3.5 - 5.0 g/dL   AST 23 15 - 41 U/L   ALT 18 14 - 54 U/L   Alkaline Phosphatase 69 38 - 126 U/L   Total  Bilirubin 0.5 0.3 - 1.2 mg/dL   GFR calc non Af Amer 58 (L) >60 mL/min   GFR calc Af Amer >60 >60 mL/min   Anion gap 8 5 - 15  Troponin I     Status: None   Collection Time: 12/10/17  5:50 PM  Result Value Ref Range   Troponin I <0.03 <0.03 ng/mL   ____________________________________________  EKG My review and personal interpretation at Time: 17:57   Indication: facial droop  Rate: 65  Rhythm: sinus Axis: normal Other:  Normal intervals, no stemi ____________________________________________  RADIOLOGY  I personally reviewed all radiographic images ordered to evaluate for the above acute complaints and reviewed radiology reports and findings.  These findings were personally discussed with the patient.  Please see medical record for radiology report.  ____________________________________________   PROCEDURES  Procedure(s) performed:  Procedures    Critical Care performed: no ____________________________________________   INITIAL IMPRESSION / ASSESSMENT AND PLAN / ED COURSE  Pertinent labs & imaging results that were available during my care of the patient were reviewed by me and considered in my medical decision making (see chart for details).  DDX: cva, tia, hypoglycemia, dehydration, electrolyte abnormality, dissection, sepsis   GLENDA SPELMAN is a 72 y.o. who presents to the ED with symptoms as described above.  Possible Bell's palsy but is sparing the forehead therefore will order MRI to further characterize as her CT head without contrast shows no evidence of bleed.  Does have some risk factors for stroke but fortunately does not smoke.  Will give aspirin.  No trauma.  EKG shows sinus rhythm.  Will be signed out to oncoming physician pending results of MRI.   Patient reassessed feels that she is having some improvement in the tingling in that tingling in her right arm has now resolved.  Also just performed the patient had a ear infection over the past week so this very  well may be Bell's palsy.  Currently awaiting on MRI.  Signed out to oncoming physician for disposition based on MRI.   As part of my medical decision making, I reviewed the following data within the Start notes reviewed and incorporated, Labs reviewed, notes from prior ED visits.   ____________________________________________   FINAL CLINICAL IMPRESSION(S) / ED DIAGNOSES  Final diagnoses:  Facial droop      NEW MEDICATIONS STARTED DURING THIS VISIT:  New Prescriptions   No medications on file     Note:  This document was prepared using Dragon voice recognition software and may include unintentional dictation errors.    Merlyn Lot, MD 12/10/17 0601    Merlyn Lot, MD 12/10/17 2052

## 2017-12-10 NOTE — Telephone Encounter (Signed)
Pt called with sx of right facial numbness, right eye numbness , right arm tingling and her mouth looks "funny" or "crooked". Pt advised to go to nearest ED NOW. Called pt back to call 911 but refused stating she is being taken and is leaving to go to hospital now Reason for Disposition . [1] Numbness (i.e., loss of sensation) of the face, arm / hand, or leg / foot on one side of the body AND [2] sudden onset AND [3] present now  Answer Assessment - Initial Assessment Questions 1. SYMPTOM: "What is the main symptom you are concerned about?" (e.g., weakness, numbness)     Right facial numbness, right eye numbness and right arm  tingling 2. ONSET: "When did this start?" (minutes, hours, days; while sleeping)     0900 this am 3. LAST NORMAL: "When was the last time you were normal (no symptoms)?"     Did not ask sent pt to ED 4. PATTERN "Does this come and go, or has it been constant since it started?"  "Is it present now?"     Did not ask 5. CARDIAC SYMPTOMS: "Have you had any of the following symptoms: chest pain, difficulty breathing, palpitations?"     Did not ask 6. NEUROLOGIC SYMPTOMS: "Have you had any of the following symptoms: headache, dizziness, vision loss, double vision, changes in speech, unsteady on your feet?"     Pt stated her mouth looks funny or crooked 7. OTHER SYMPTOMS: "Do you have any other symptoms?"     Did not ask 8. PREGNANCY: "Is there any chance you are pregnant?" "When was your last menstrual period?"     n/a  Protocols used: NEUROLOGIC DEFICIT-A-AH

## 2017-12-10 NOTE — ED Provider Notes (Addendum)
-----------------------------------------   9:55 PM on 12/10/2017 -----------------------------------------  Patient care assumed from Dr. Quentin Cornwall.  Patient's MRI has resulted negative for acute infarction.  I have personally seen and evaluated the patient, she has significant right facial droop unable to close her right eye, states right eye irritation.  In speaking to the patient she recently is undergone an upper respiratory infection with an ear infection 2 weeks ago was prescribed amoxicillin with some relief.  Saw ENT and just finished a course of prednisone approximately 6 or 7 days ago.  Symptomology and timeline very consistent with Bell's palsy.  I discussed Bell's palsy care including using saline eyedrops, taping her eye shut at night.  Since the patient was recently on a course of prednisone we will prescribe a taper of prednisone for the patient, prescribed 1 week of valacyclovir for the patient.  Patient will follow up with her doctor.  Patient agreeable to this plan of care.  Tympanic membrane appears slightly opacified, but no erythema, normal-appearing auditory canal.   Harvest Dark, MD 12/10/17 2157    Harvest Dark, MD 12/10/17 2157

## 2017-12-10 NOTE — ED Triage Notes (Signed)
Patient presents to the ED with facial numbness/tingling and uneven smile since about 8am.  Patient is not able to blink her right eye, left eye is blinking frequently.  Patient states, "when I tried to drink water, water just kind of fell out of my mouth."  Patient denies confusion.  Walking with steady gait.

## 2017-12-10 NOTE — ED Notes (Signed)
Patient transported to MRI 

## 2017-12-11 NOTE — Telephone Encounter (Signed)
It appears the patient was diagnosed with Bell's palsy and discharged with treatment for this.  Please try to get her set up for follow-up next week.  Thanks.

## 2017-12-15 ENCOUNTER — Other Ambulatory Visit: Payer: Self-pay | Admitting: Family Medicine

## 2017-12-15 NOTE — Telephone Encounter (Signed)
Copied from [place]. Topic: General - Other >> Dec 15, 2017 12:03 PM Darl Householder, RMA wrote: Reason for CRM: Medication refill request for valACYclovir (VALTREX) 1000 MG tablet to be sent to [facility]

## 2017-12-15 NOTE — Telephone Encounter (Signed)
Pt. Reports she was put on Prednisone and Valtrex when she was in the ED last week with Bell's Palsy. She will be out of Valtrex Thursday. Does Dr. Caryl Bis want her to stay on it until she sees him 12/23/17. Please advise pt.

## 2017-12-15 NOTE — Telephone Encounter (Signed)
Left message to return call to office, [facility] may schedule for ED follow up.

## 2017-12-15 NOTE — Telephone Encounter (Signed)
Please advise 

## 2017-12-15 NOTE — Telephone Encounter (Signed)
She should complete the one week course of the valtrex as prescribed by the ED. She should not need any additional valtrex.

## 2017-12-16 ENCOUNTER — Encounter: Payer: Self-pay | Admitting: Family Medicine

## 2017-12-16 DIAGNOSIS — F458 Other somatoform disorders: Secondary | ICD-10-CM | POA: Diagnosis not present

## 2017-12-16 DIAGNOSIS — H6121 Impacted cerumen, right ear: Secondary | ICD-10-CM | POA: Diagnosis not present

## 2017-12-16 DIAGNOSIS — J309 Allergic rhinitis, unspecified: Secondary | ICD-10-CM | POA: Diagnosis not present

## 2017-12-16 DIAGNOSIS — G510 Bell's palsy: Secondary | ICD-10-CM | POA: Diagnosis not present

## 2017-12-16 NOTE — Telephone Encounter (Signed)
Patient returned call, given the recommendation of Dr. Caryl Bis from the note on 12/15/17, patient verbalized understanding and says she will see him on 12/23/17 and discuss everything at that time.

## 2017-12-16 NOTE — Telephone Encounter (Signed)
This encounter was created in error - please disregard.

## 2017-12-16 NOTE — Telephone Encounter (Signed)
Has follow up scheduled.

## 2017-12-16 NOTE — Telephone Encounter (Signed)
Left message to return call, ok for pec to speak to patient 

## 2017-12-17 DIAGNOSIS — G510 Bell's palsy: Secondary | ICD-10-CM | POA: Diagnosis not present

## 2017-12-17 NOTE — Telephone Encounter (Signed)
fyi

## 2017-12-22 DIAGNOSIS — G510 Bell's palsy: Secondary | ICD-10-CM | POA: Diagnosis not present

## 2017-12-23 ENCOUNTER — Encounter: Payer: Self-pay | Admitting: Family Medicine

## 2017-12-23 ENCOUNTER — Other Ambulatory Visit: Payer: Self-pay

## 2017-12-23 ENCOUNTER — Ambulatory Visit (INDEPENDENT_AMBULATORY_CARE_PROVIDER_SITE_OTHER): Payer: Medicare HMO | Admitting: Family Medicine

## 2017-12-23 VITALS — BP 144/82 | HR 75 | Temp 97.5°F | Wt 182.4 lb

## 2017-12-23 DIAGNOSIS — C4491 Basal cell carcinoma of skin, unspecified: Secondary | ICD-10-CM | POA: Diagnosis not present

## 2017-12-23 DIAGNOSIS — G510 Bell's palsy: Secondary | ICD-10-CM | POA: Diagnosis not present

## 2017-12-23 DIAGNOSIS — I10 Essential (primary) hypertension: Secondary | ICD-10-CM

## 2017-12-23 DIAGNOSIS — G629 Polyneuropathy, unspecified: Secondary | ICD-10-CM | POA: Insufficient documentation

## 2017-12-23 MED ORDER — AMLODIPINE BESYLATE 5 MG PO TABS: 5.0000 mg | ORAL_TABLET | Freq: Every day | ORAL | 3 refills | Status: DC

## 2017-12-23 NOTE — Assessment & Plan Note (Signed)
History and time course seems consistent with Bell's palsy.  MRI negative.  She has improved mildly with Valtrex and prednisone.  Discussed continuing to see ophthalmology and continuing with eyedrops as well as taping her eye shot.  Discussed that she may improve completely or not improve to any significant degree.  She will monitor at this time.

## 2017-12-23 NOTE — Assessment & Plan Note (Signed)
Patient notes chronic burning and numbness in her feet and hands.  Suspect related to neuropathy.  Prior A1c in the normal range.  Prior B12 in the normal range.  Given her father's history I advised referral to neurology for further evaluation.  Referral was placed.

## 2017-12-23 NOTE — Progress Notes (Signed)
[name], MD Phone: [phone]  Monica Larson is a 72 y.o. female who presents today for f/u.  Bell's palsy: Patient was evaluated in the emergency department after developing right facial droop and tingling as well as right hand tingling.  She underwent MRI which did not reveal acute infarct.  The ED physician felt her course was consistent with Bell's palsy.  She had an upper respiratory infection several weeks prior and was treated with Augmentin and prednisone.  She notes her hand symptoms have resolved.  Notes her facial symptoms have been improving.  Does continue to have some issues closing her eye though it is improved.  She is following with ophthalmology regarding her eye.  She tapes it closed at night.  She is using drops every hour.  She did see ENT and they felt she was improving as well.  Hypertension: Not checking at home.  No chest pain or shortness of breath.  She is on losartan.  Her ENT physician felt as though the losartan was related enough to lisinopril that it could be contributing to her chronic congestion in her throat and cough.  She reports they recommended altering her blood pressure regimen.  History of basal cell carcinoma.  She follows with dermatology twice yearly.  Neuropathy: She has chronic burning sensation in her feet and hands.  They feel numb.  Notes this has been chronic.  Her father had similar issues though it progressed to where he could not use his legs at all.  Social History   Tobacco Use  Smoking Status Never Smoker  Smokeless Tobacco Never Used  Tobacco Comment   1 cig every 3 months      ROS see history of present illness  Objective  Physical Exam Vitals:   12/23/17 1136 12/23/17 1205  BP: (!) 158/72 (!) 144/82  Pulse: 75   Temp: (!) 97.5 F (36.4 C)   SpO2: 97%     BP Readings from Last 3 Encounters:  12/23/17 (!) 144/82  12/10/17 (!) 145/81  12/03/17 124/64   Wt Readings from Last 3 Encounters:  12/23/17 182 lb 6.4  oz (82.7 kg)  12/10/17 185 lb (83.9 kg)  12/03/17 184 lb 4 oz (83.6 kg)    Physical Exam  Constitutional: No distress.  HENT:  Mouth/Throat: Oropharynx is clear and moist. No oropharyngeal exudate.  Eyes: Pupils are equal, round, and reactive to light. Conjunctivae and EOM are normal.  Cardiovascular: Normal rate, regular rhythm and normal heart sounds.  Pulmonary/Chest: Effort normal and breath sounds normal.  Musculoskeletal: She exhibits no edema.  Neurological: She is alert.  Right facial droop noted, she is able to close her eyes and smiled though it is not as high on the right side, otherwise CN 2-12 intact, 5/5 strength in bilateral biceps, triceps, grip, quads, hamstrings, plantar and dorsiflexion, sensation to light touch intact in bilateral UE and LE, normal gait  Skin: Skin is warm and dry. She is not diaphoretic.     Assessment/Plan: Please see individual problem list.  Basal cell carcinoma She continues to follow with dermatology.  Hypertension Above goal.  Her ENT physician has suggested changing the losartan.  I discussed that I did not think that her throat congestion would be related to the losartan though we can change her to amlodipine as she has tolerated that previously.  When she gets that in the mail she will discontinue the losartan and then the next day start her amlodipine.  She will follow-up in 1  month for BP check with nursing.  Neuropathy Patient notes chronic burning and numbness in her feet and hands.  Suspect related to neuropathy.  Prior A1c in the normal range.  Prior B12 in the normal range.  Given her father's history I advised referral to neurology for further evaluation.  Referral was placed.  Bell's palsy History and time course seems consistent with Bell's palsy.  MRI negative.  She has improved mildly with Valtrex and prednisone.  Discussed continuing to see ophthalmology and continuing with eyedrops as well as taping her eye shot.  Discussed  that she may improve completely or not improve to any significant degree.  She will monitor at this time.   Orders Placed This Encounter  Procedures  . Ambulatory referral to Neurology    Referral Priority:   Routine    Referral Type:   Consultation    Referral Reason:   Specialty Services Required    Requested Specialty:   Neurology    Number of Visits Requested:   1    Meds ordered this encounter  Medications  . amLODipine (NORVASC) 5 MG tablet    Sig: Take 1 tablet (5 mg total) by mouth daily.    Dispense:  90 tablet    Refill:  [place], MD [place]

## 2017-12-23 NOTE — Assessment & Plan Note (Signed)
Above goal.  Her ENT physician has suggested changing the losartan.  I discussed that I did not think that her throat congestion would be related to the losartan though we can change her to amlodipine as she has tolerated that previously.  When she gets that in the mail she will discontinue the losartan and then the next day start her amlodipine.  She will follow-up in 1 month for BP check with nursing.

## 2017-12-23 NOTE — Assessment & Plan Note (Signed)
She continues to follow with dermatology.

## 2017-12-23 NOTE — Patient Instructions (Signed)
Nice to see you. We will start you on amlodipine for your BP. Please monitor your BP at home.  Please continue to see ophthalmology for your bells palsy. We will get you to see neurology for your neuropathy.

## 2017-12-31 ENCOUNTER — Ambulatory Visit: Payer: Medicare HMO | Admitting: Family Medicine

## 2017-12-31 ENCOUNTER — Encounter

## 2017-12-31 DIAGNOSIS — N35919 Unspecified urethral stricture, male, unspecified site: Secondary | ICD-10-CM | POA: Diagnosis not present

## 2017-12-31 DIAGNOSIS — H16211 Exposure keratoconjunctivitis, right eye: Secondary | ICD-10-CM | POA: Diagnosis not present

## 2017-12-31 DIAGNOSIS — R339 Retention of urine, unspecified: Secondary | ICD-10-CM | POA: Diagnosis not present

## 2017-12-31 DIAGNOSIS — G510 Bell's palsy: Secondary | ICD-10-CM | POA: Diagnosis not present

## 2018-01-13 DIAGNOSIS — G510 Bell's palsy: Secondary | ICD-10-CM | POA: Diagnosis not present

## 2018-01-13 DIAGNOSIS — H16211 Exposure keratoconjunctivitis, right eye: Secondary | ICD-10-CM | POA: Diagnosis not present

## 2018-01-20 DIAGNOSIS — G510 Bell's palsy: Secondary | ICD-10-CM | POA: Diagnosis not present

## 2018-01-20 DIAGNOSIS — H16211 Exposure keratoconjunctivitis, right eye: Secondary | ICD-10-CM | POA: Diagnosis not present

## 2018-01-22 ENCOUNTER — Ambulatory Visit: Payer: Medicare HMO

## 2018-02-10 DIAGNOSIS — H16211 Exposure keratoconjunctivitis, right eye: Secondary | ICD-10-CM | POA: Diagnosis not present

## 2018-02-10 DIAGNOSIS — H04123 Dry eye syndrome of bilateral lacrimal glands: Secondary | ICD-10-CM | POA: Diagnosis not present

## 2018-02-10 DIAGNOSIS — H04122 Dry eye syndrome of left lacrimal gland: Secondary | ICD-10-CM | POA: Diagnosis not present

## 2018-02-10 DIAGNOSIS — H04121 Dry eye syndrome of right lacrimal gland: Secondary | ICD-10-CM | POA: Diagnosis not present

## 2018-02-27 ENCOUNTER — Telehealth: Payer: Self-pay | Admitting: *Deleted

## 2018-02-27 ENCOUNTER — Encounter: Payer: Self-pay | Admitting: Neurology

## 2018-02-27 ENCOUNTER — Ambulatory Visit: Payer: Medicare HMO | Admitting: Neurology

## 2018-02-27 VITALS — BP 168/84 | HR 83 | Ht 67.0 in | Wt 184.0 lb

## 2018-02-27 DIAGNOSIS — R252 Cramp and spasm: Secondary | ICD-10-CM | POA: Diagnosis not present

## 2018-02-27 DIAGNOSIS — E538 Deficiency of other specified B group vitamins: Secondary | ICD-10-CM | POA: Diagnosis not present

## 2018-02-27 DIAGNOSIS — R202 Paresthesia of skin: Secondary | ICD-10-CM | POA: Diagnosis not present

## 2018-02-27 MED ORDER — BACLOFEN 10 MG PO TABS: 10.0000 mg | ORAL_TABLET | Freq: Every day | ORAL | 3 refills | Status: DC

## 2018-02-27 NOTE — Telephone Encounter (Signed)
Left message to return call, ok for pec to inform patient that with her insurance they recommend to have this done at the pharmacy

## 2018-02-27 NOTE — Progress Notes (Signed)
Reason for visit: Possible peripheral neuropathy  Referring physician: Dr. [name] is a 72 y.o. female  History of present illness:  [name] is a 72 year old right-handed white female with a history of some alteration in sensation in the feet and hands dating back about 1 year.  The patient has had a sensation of slight numbness, she feels that the skin in the feet is tight and sometimes has a sunburn type feeling on the feet.  The patient also reports some numbness in the hands, the symptoms are oftentimes worse in the evening hours.  The patient also has nocturnal leg cramps, she is on a statin drug as well.  She is taking mustard for the cramps and in the past has tried magnesium but this caused diarrhea.  The patient has had one recent fall, she denies significant gait instability otherwise.  She does report some generalized fatigue, but no true weakness of the extremities.  She does have some low back pain without significant sciatica type discomfort, she denies any neck discomfort.  She denies issues controlling the bowels but she does have to in and out catheterize her bladder which she has been doing for about 1 year.  In early April 2019, the patient had an episode of right sided Bell's palsy.  MRI of the brain showed a mild to moderate level small vessel ischemic changes but no acute stroke was noted.  The patient is sent to this office for further evaluation.  Past Medical History:  Diagnosis Date  . Arthritis    HANDS/NECK/SHOULDERS  . Asthma    SEASONAL  . Diarrhea    INTERMITTENT CHRONIC  . Fatigue   . GERD (gastroesophageal reflux disease)   . H/O motion sickness 06/17/2016  . Headache    SINUS/ ALLERGIES  . HLD (hyperlipidemia)    borderline  . HTN (hypertension)    CONTROLLED ON MEDS  . Motion sickness   . Seasonal allergies   . Shortness of breath dyspnea    ON EXERTION    Past Surgical History:  Procedure Laterality Date  . BREAST BIOPSY  Bilateral    NEG  . BREAST EXCISIONAL BIOPSY Bilateral    NEG  . CARPAL TUNNEL RELEASE     right wrist  . CHOLECYSTECTOMY    . COLONOSCOPY N/A 01/31/2016   Procedure: COLONOSCOPY;  Surgeon: Hulen Luster, MD;  Location: [facility];  Service: Gastroenterology;  Laterality: N/A;  . EYE SURGERY     muscular as child/ CATARACT BILATERAL  . SKIN CANCER EXCISION Right Wrist and thigh (x2)   Unsure of dates and Dermatologist  . VESICOVAGINAL FISTULA CLOSURE W/ TAH      Family History  Problem Relation Age of Onset  . Ovarian cancer Mother   . Prostate cancer Father   . Coronary artery disease Father   . Diabetes Father   . Heart attack Father   . Heart attack Brother   . Breast cancer Neg Hx     Social history:  reports that she has never smoked. She has never used smokeless tobacco. She reports that she drinks alcohol. She reports that she does not use drugs.  Medications:  Prior to Admission medications   Medication Sig Start Date End Date Taking? Authorizing Provider  amLODipine (NORVASC) 5 MG tablet Take 1 tablet (5 mg total) by mouth daily. 12/23/17  Yes Leone Haven, MD  amoxicillin-clavulanate (AUGMENTIN) 875-125 MG tablet Take 1 tablet by mouth 2 (  two) times daily. 11/18/17  Yes Kordsmeier, Gregary Signs, FNP  aspirin (ASPIR-LOW) 81 MG EC tablet Take 81 mg by mouth daily.    Yes [provider]  diphenhydrAMINE (BENADRYL) 25 MG tablet Take 25 mg by mouth daily as needed for itching, allergies or sleep.   Yes [provider]  losartan (COZAAR) 50 MG tablet Take 1 tablet (50 mg total) by mouth daily. 12/02/17  Yes Leone Haven, MD  Multiple Vitamins-Minerals (MULTIVITAMIN WITH MINERALS) tablet Take 1 tablet by mouth daily.    Yes [provider]  Omega-3 Fatty Acids (OMEGA-3 FISH OIL PO) Take 1 capsule by mouth daily.    Yes [provider]  omeprazole (PRILOSEC) 20 MG capsule Take 1 capsule (20 mg total) by mouth daily as needed. For  acid reflux 10/03/17  Yes Leone Haven, MD  oxybutynin (DITROPAN) 5 MG tablet Take 2 tablets (10 mg total) by mouth daily. 10/03/17  Yes Leone Haven, MD  rosuvastatin (CRESTOR) 20 MG tablet Take 1 tablet (20 mg total) by mouth daily. 10/15/17  Yes Leone Haven, MD  traZODone (DESYREL) 150 MG tablet Take 1 tablet (150 mg total) by mouth at bedtime. 10/03/17  Yes Leone Haven, MD  baclofen (LIORESAL) 10 MG tablet Take 1 tablet (10 mg total) by mouth at bedtime. 02/27/18   Kathrynn Ducking, MD      Allergies  Allergen Reactions  . Shellfish Allergy   . Sulfa Antibiotics     hives  . Sulfonamide Derivatives     ROS:  Out of a complete 14 system review of symptoms, the patient complains only of the following symptoms, and all other reviewed systems are negative.  Fatigue Swelling in the legs Dizziness, difficulty swallowing Urinary incontinence Joint pain, aching muscles Allergies Headache, numbness, weakness Decreased energy Restless legs  Blood pressure (!) 168/84, pulse 83, height 5\' 7"  (1.702 m), weight 184 lb (83.5 kg).  Physical Exam  General: The patient is alert and cooperative at the time of the examination.  Eyes: Pupils are equal, round, and reactive to light. Discs are flat bilaterally.  Neck: The neck is supple, no carotid bruits are noted.  Respiratory: The respiratory examination is clear.  Cardiovascular: The cardiovascular examination reveals a regular rate and rhythm, no obvious murmurs or rubs are noted.  Skin: Extremities are without significant edema.  Neurologic Exam  Mental status: The patient is alert and oriented x 3 at the time of the examination. The patient has apparent normal recent and remote memory, with an apparently normal attention span and concentration ability.  Cranial nerves: Facial symmetry is present. There is good sensation of the face to pinprick and soft touch bilaterally. The strength of the facial muscles  and the muscles to head turning and shoulder shrug are normal bilaterally. Speech is well enunciated, no aphasia or dysarthria is noted. Extraocular movements are full. Visual fields are full. The tongue is midline, and the patient has symmetric elevation of the soft palate. No obvious hearing deficits are noted.  Motor: The motor testing reveals 5 over 5 strength of all 4 extremities. Good symmetric motor tone is noted throughout.  Sensory: Sensory testing is intact to pinprick, soft touch, vibration sensation, and position sense on all 4 extremities, with exception of a stocking glove pinprick sensory deficit to just below the knees bilaterally, some slight impairment of position sense in the right foot. No evidence of extinction is noted.  Coordination: Cerebellar testing reveals good finger-nose-finger and  heel-to-shin bilaterally.  Gait and station: Gait is normal. Tandem gait is slightly unsteady. Romberg is negative. No drift is seen.  The patient is able to walk on heels and the toes bilaterally.  Reflexes: Deep tendon reflexes are symmetric, but are slightly depressed bilaterally. Toes are downgoing bilaterally.   Assessment/Plan:  1.  Sensory alteration, all 4 extremities, possible neuropathy  2.  Nocturnal leg cramps  A prescription was given for baclofen 10 mg to take at night for the muscle cramps.  The patient is on a statin drug and may have a neuropathy.  The patient will be set up for nerve conduction studies on both lower extremities and on one upper extremity.  EMG study will be done on one lower extremity.  The patient will have blood work done today.  She will follow-up for the EMG evaluation.  Jill Alexanders MD 02/27/2018 12:02 PM  [facility] [address] [place], [place] [address]  Phone [phone] Fax [phone]

## 2018-02-27 NOTE — Telephone Encounter (Signed)
Ok to schedule.

## 2018-02-27 NOTE — Telephone Encounter (Signed)
Copied from [place] [id number]. Topic: Inquiry >> Feb 26, 2018  1:01 PM Monica Larson wrote: Reason for CRM: pt called to get info about getting scheduled for the shingrix vaccine, call pt to advise

## 2018-02-27 NOTE — Patient Instructions (Signed)
We will get EMG and NCV study to look at the nerve function of the legs.

## 2018-02-27 NOTE — Telephone Encounter (Signed)
I typically have Medicare and healthteam advantage patients go to the pharmacy to have this done.  I believe it is cheaper at the pharmacy.

## 2018-03-02 NOTE — Telephone Encounter (Signed)
Completed at pharmacy

## 2018-03-03 DIAGNOSIS — L821 Other seborrheic keratosis: Secondary | ICD-10-CM | POA: Diagnosis not present

## 2018-03-03 DIAGNOSIS — L720 Epidermal cyst: Secondary | ICD-10-CM | POA: Diagnosis not present

## 2018-03-03 DIAGNOSIS — L111 Transient acantholytic dermatosis [Grover]: Secondary | ICD-10-CM | POA: Diagnosis not present

## 2018-03-03 LAB — MULTIPLE MYELOMA PANEL, SERUM
Albumin SerPl Elph-Mcnc: 3.8 g/dL (ref 2.9–4.4)
Albumin/Glob SerPl: 1.2 (ref 0.7–1.7)
Alpha 1: 0.3 g/dL (ref 0.0–0.4)
Alpha2 Glob SerPl Elph-Mcnc: 0.9 g/dL (ref 0.4–1.0)
B-Globulin SerPl Elph-Mcnc: 1.2 g/dL (ref 0.7–1.3)
Gamma Glob SerPl Elph-Mcnc: 1 g/dL (ref 0.4–1.8)
Globulin, Total: 3.3 g/dL (ref 2.2–3.9)
IGA/IMMUNOGLOBULIN A, SERUM: 182 mg/dL (ref 64–422)
IGG (IMMUNOGLOBIN G), SERUM: 1015 mg/dL (ref 700–1600)
IGM (IMMUNOGLOBULIN M), SRM: 35 mg/dL (ref 26–217)
TOTAL PROTEIN: 7.1 g/dL (ref 6.0–8.5)

## 2018-03-03 LAB — VITAMIN B12: VITAMIN B 12: 401 pg/mL (ref 232–1245)

## 2018-03-03 LAB — SEDIMENTATION RATE: SED RATE: 2 mm/h (ref 0–40)

## 2018-03-03 LAB — ANGIOTENSIN CONVERTING ENZYME: ANGIO CONVERT ENZYME: 54 U/L (ref 14–82)

## 2018-03-03 LAB — ANA W/REFLEX: Anti Nuclear Antibody(ANA): NEGATIVE

## 2018-03-03 LAB — B. BURGDORFI ANTIBODIES: Lyme IgG/IgM Ab: <0.91 {ISR} (ref 0.00–0.90)

## 2018-03-06 DIAGNOSIS — H26491 Other secondary cataract, right eye: Secondary | ICD-10-CM | POA: Diagnosis not present

## 2018-03-06 DIAGNOSIS — H16211 Exposure keratoconjunctivitis, right eye: Secondary | ICD-10-CM | POA: Diagnosis not present

## 2018-04-02 ENCOUNTER — Ambulatory Visit (INDEPENDENT_AMBULATORY_CARE_PROVIDER_SITE_OTHER): Payer: Medicare HMO | Admitting: Family Medicine

## 2018-04-02 ENCOUNTER — Encounter: Payer: Self-pay | Admitting: Family Medicine

## 2018-04-02 ENCOUNTER — Telehealth: Payer: Self-pay | Admitting: Family Medicine

## 2018-04-02 VITALS — BP 130/70 | HR 69 | Temp 97.4°F | Ht 67.0 in | Wt 184.8 lb

## 2018-04-02 DIAGNOSIS — I10 Essential (primary) hypertension: Secondary | ICD-10-CM

## 2018-04-02 DIAGNOSIS — R252 Cramp and spasm: Secondary | ICD-10-CM | POA: Diagnosis not present

## 2018-04-02 DIAGNOSIS — E782 Mixed hyperlipidemia: Secondary | ICD-10-CM

## 2018-04-02 DIAGNOSIS — G629 Polyneuropathy, unspecified: Secondary | ICD-10-CM | POA: Diagnosis not present

## 2018-04-02 DIAGNOSIS — R7309 Other abnormal glucose: Secondary | ICD-10-CM

## 2018-04-02 DIAGNOSIS — N3941 Urge incontinence: Secondary | ICD-10-CM

## 2018-04-02 LAB — BASIC METABOLIC PANEL
BUN: 16 mg/dL (ref 6–23)
CALCIUM: 9.6 mg/dL (ref 8.4–10.5)
CO2: 29 mEq/L (ref 19–32)
CREATININE: 0.82 mg/dL (ref 0.40–1.20)
Chloride: 103 mEq/L (ref 96–112)
GFR: 72.63 mL/min (ref >60.00)
Glucose, Bld: 115 mg/dL — ABNORMAL HIGH (ref 70–99)
Potassium: 4.2 mEq/L (ref 3.5–5.1)
Sodium: 139 mEq/L (ref 135–145)

## 2018-04-02 LAB — HEMOGLOBIN A1C: Hgb A1c MFr Bld: 5.8 % (ref 4.6–6.5)

## 2018-04-02 MED ORDER — NITROFURANTOIN MACROCRYSTAL 50 MG PO CAPS: 50.0000 mg | ORAL_CAPSULE | ORAL | 2 refills | Status: DC

## 2018-04-02 NOTE — Assessment & Plan Note (Signed)
Continue Crestor.  Discussed MiraLAX for constipation instead of other over-the-counter medications.

## 2018-04-02 NOTE — Assessment & Plan Note (Signed)
Chronic issue.  She needs new urologist.  She will contact a local urology office to see if she can get in to see them.  We will refill her nitrofurantoin until she is able to get in to see urology.

## 2018-04-02 NOTE — Telephone Encounter (Signed)
Copied from [place] [id number]. Topic: Quick Communication - Rx Refill/Question >> Apr 02, 2018  1:40 PM Monica Larson wrote: Medication: homeopathic leg cramp release  Pt called [facility]/c pcp wanted to know what the pt was taking for her leg cramps, contact pt for any advisement

## 2018-04-02 NOTE — Patient Instructions (Signed)
Nice to see you. Please let us know if you need a referral to urology.  I have refilled your nitrofurantoin so that you do not run out.  Please try MiraLAX for your constipation. We will get some lab work today and contact you with the results.

## 2018-04-02 NOTE — Telephone Encounter (Signed)
Left detailed message to notify.

## 2018-04-02 NOTE — Assessment & Plan Note (Signed)
Controlled.  Continue current regimen.  Mild swelling might be related to her amlodipine.

## 2018-04-02 NOTE — Assessment & Plan Note (Signed)
She will let us know regarding the over-the-counter medicine.  She is afraid to take the baclofen and thus she will not start on this.  Check electrolytes.

## 2018-04-02 NOTE — Assessment & Plan Note (Signed)
She will complete evaluation through neurology. 

## 2018-04-02 NOTE — Progress Notes (Signed)
  Monica Rumps, MD Phone: [phone]  Monica Larson is a 72 y.o. female who presents today for f/u.  CC: htn, hld, urge incontinence, neuropathy  HYPERTENSION  Disease Monitoring  Home BP Monitoring not checking consistently Chest pain- no    Dyspnea- no Medications  Compliance-  Taking amlodipine.  Edema-some, no orthopnea or PND  HYPERLIPIDEMIA Symptoms Chest pain on exertion:  no    Medications: Compliance- taking crestor Right upper quadrant pain- no  Muscle aches- no Note some cramps in her legs.  She reports neurology gave her baclofen for this.  Has been taking an over-the-counter supplement for her cramps.  She is going to have nerve conduction studies for her neuropathy.  Urge incontinence: She continues on medications.  She self catheterizes related to a urethral stricture.  She was following with urology though they are no longer at that office and she needs a new urologist.  She takes nitrofurantoin every other day to help prevent UTIs related to self-catheterization.  She needs a refill of this.    Social History   Tobacco Use  Smoking Status Never Smoker  Smokeless Tobacco Never Used  Tobacco Comment   1 cig every 3 months      ROS see history of present illness  Objective  Physical Exam Vitals:   04/02/18 1114  BP: 130/70  Pulse: 69  Temp: (!) 97.4 F (36.3 C)  SpO2: 95%    BP Readings from Last 3 Encounters:  04/02/18 130/70  02/27/18 (!) 168/84  12/23/17 (!) 144/82   Wt Readings from Last 3 Encounters:  04/02/18 184 lb 12.8 oz (83.8 kg)  02/27/18 184 lb (83.5 kg)  12/23/17 182 lb 6.4 oz (82.7 kg)    Physical Exam  Constitutional: No distress.  Cardiovascular: Normal rate, regular rhythm and normal heart sounds.  Pulmonary/Chest: Effort normal and breath sounds normal.  Abdominal: Soft. Bowel sounds are normal. She exhibits no distension. There is no tenderness.  Musculoskeletal: She exhibits no edema.  No calf tenderness or cramps  on exam  Neurological: She is alert.  Skin: Skin is warm and dry. She is not diaphoretic.     Assessment/Plan: Please see individual problem list.  Hypertension Controlled.  Continue current regimen.  Mild swelling might be related to her amlodipine.  Urge incontinence Chronic issue.  She needs new urologist.  She will contact a local urology office to see if she can get in to see them.  We will refill her nitrofurantoin until she is able to get in to see urology.  Leg cramps She will let us know regarding the over-the-counter medicine.  She is afraid to take the baclofen and thus she will not start on this.  Check electrolytes.  Mixed hyperlipidemia Continue Crestor.  Discussed MiraLAX for constipation instead of other over-the-counter medications.  Neuropathy She will complete evaluation through neurology.    Orders Placed This Encounter  Procedures  . HgB A1c  . Basic Metabolic Panel (BMET)    Meds ordered this encounter  Medications  . nitrofurantoin (MACRODANTIN) 50 MG capsule    Sig: Take 1 capsule (50 mg total) by mouth every other day.    Dispense:  30 capsule    Refill:  2     Monica Rumps, MD [place]

## 2018-04-02 NOTE — Telephone Encounter (Signed)
I suggest she avoid taking this as it is difficult to say how it will interact with her other medications.

## 2018-04-02 NOTE — Telephone Encounter (Signed)
fyi

## 2018-04-13 ENCOUNTER — Encounter: Payer: Medicare HMO | Admitting: Neurology

## 2018-04-16 ENCOUNTER — Ambulatory Visit: Payer: Self-pay | Admitting: *Deleted

## 2018-04-16 NOTE — Telephone Encounter (Signed)
Pt reports noted middle toe of left foot swollen, purplish, and "Goes to the side a little." States hardened nodule on top of first joint, size of a pea. States toe is numb, no pain. Swelling is mild-moderate, goes "Past first joint." Does not recall any injury, not aware of any insect bites. Discoloration does not extend, no warmth. Pt is traveling home from beach. Agent made appt for tomorrow AM prior to triage.  Care advise given per protocol; instructed pt to go to UC if redness, swelling extends, worsens.   Reason for Disposition . Large swelling or bruise (> 2 inches or 5 cm)  Answer Assessment - Initial Assessment Questions 1. MECHANISM: "How did the injury happen?" (e.g., twisting injury, direct blow)      Unsure 2. ONSET: "When did the injury happen?" (Minutes or hours ago)     unsure 3. LOCATION: "Where is the injury located?"      Lt foot, middle toe 4. APPEARANCE of INJURY: "What does the injury look like?"      Swelling, purplish 5. WEIGHT-BEARING: "Can you put weight on that foot?" "Can you walk (four steps or more)?"       Yes 6. SIZE: For cuts, bruises, or swelling, ask: "How large is it?" (e.g., inches or centimeters;  entire joint)      Swelling mid-moderate 7. PAIN: "Is there pain?" If so, ask: "How bad is the pain?"    (e.g., Scale 1-10; or mild, moderate, severe)     No, Numb 8. TETANUS: For any breaks in the skin, ask: "When was the last tetanus booster?"     N/A  9. OTHER SYMPTOMS: "Do you have any other symptoms?"      Hardened nodule top of first joint.  Protocols used: FOOT AND ANKLE INJURY-A-AH

## 2018-04-17 ENCOUNTER — Ambulatory Visit (INDEPENDENT_AMBULATORY_CARE_PROVIDER_SITE_OTHER): Payer: Medicare HMO

## 2018-04-17 ENCOUNTER — Encounter: Payer: Self-pay | Admitting: Internal Medicine

## 2018-04-17 ENCOUNTER — Ambulatory Visit (INDEPENDENT_AMBULATORY_CARE_PROVIDER_SITE_OTHER): Payer: Medicare HMO | Admitting: Internal Medicine

## 2018-04-17 VITALS — BP 126/62 | HR 69 | Temp 98.3°F | Ht 67.0 in | Wt 187.0 lb

## 2018-04-17 DIAGNOSIS — M79675 Pain in left toe(s): Secondary | ICD-10-CM | POA: Diagnosis not present

## 2018-04-17 DIAGNOSIS — L84 Corns and callosities: Secondary | ICD-10-CM

## 2018-04-17 DIAGNOSIS — R2242 Localized swelling, mass and lump, left lower limb: Secondary | ICD-10-CM

## 2018-04-17 DIAGNOSIS — T148XXA Other injury of unspecified body region, initial encounter: Secondary | ICD-10-CM

## 2018-04-17 MED ORDER — MUPIROCIN 2 % EX OINT: 1.0000 "application " | TOPICAL_OINTMENT | Freq: Two times a day (BID) | CUTANEOUS | 0 refills | Status: DC

## 2018-04-17 NOTE — Progress Notes (Signed)
Chief Complaint  Patient presents with  . Acute Visit    left middle toe swollen   F/u with husband  1. C/o left 3rd toe lesion with larger lesion blister like x 2 days appeared as blister and swelling and redness now swelling and redness resolved after warm epsolm soaks. No h/o trauma or bite. She noticed when sister was about to pain toenails  2. Reviewed labs prediabetic + disc with pt given copy   Review of Systems  Constitutional: Negative for fever.  Respiratory: Negative for shortness of breath.   Cardiovascular: Negative for chest pain.  Musculoskeletal: Negative for joint pain.  Skin:       +left 3rd toe lesion was red and swollen now less red    Past Medical History:  Diagnosis Date  . Arthritis    HANDS/NECK/SHOULDERS  . Asthma    SEASONAL  . Diarrhea    INTERMITTENT CHRONIC  . Fatigue   . GERD (gastroesophageal reflux disease)   . H/O motion sickness 06/17/2016  . Headache    SINUS/ ALLERGIES  . HLD (hyperlipidemia)    borderline  . HTN (hypertension)    CONTROLLED ON MEDS  . Motion sickness   . Seasonal allergies   . Shortness of breath dyspnea    ON EXERTION   Past Surgical History:  Procedure Laterality Date  . BREAST BIOPSY Bilateral    NEG  . BREAST EXCISIONAL BIOPSY Bilateral    NEG  . CARPAL TUNNEL RELEASE     right wrist  . CHOLECYSTECTOMY    . COLONOSCOPY N/A 01/31/2016   Procedure: COLONOSCOPY;  Surgeon: Hulen Luster, MD;  Location: [facility];  Service: Gastroenterology;  Laterality: N/A;  . EYE SURGERY     muscular as child/ CATARACT BILATERAL  . SKIN CANCER EXCISION Right Wrist and thigh (x2)   Unsure of dates and Dermatologist  . VESICOVAGINAL FISTULA CLOSURE W/ TAH     Family History  Problem Relation Age of Onset  . Ovarian cancer Mother   . Prostate cancer Father   . Coronary artery disease Father   . Diabetes Father   . Heart attack Father   . Heart attack Brother   . Breast cancer Neg Hx    Social History    Socioeconomic History  . Marital status: Married    Spouse name: Not on file  . Number of children: Not on file  . Years of education: Not on file  . Highest education level: Not on file  Occupational History  . Not on file  Social Needs  . Financial resource strain: Not on file  . Food insecurity:    Worry: Not on file    Inability: Not on file  . Transportation needs:    Medical: Not on file    Non-medical: Not on file  Tobacco Use  . Smoking status: Never Smoker  . Smokeless tobacco: Never Used  . Tobacco comment: 1 cig every 3 months   Substance and Sexual Activity  . Alcohol use: Yes    Comment: 1 MIXED DRINK/WEEK  . Drug use: No  . Sexual activity: Not on file  Lifestyle  . Physical activity:    Days per week: Not on file    Minutes per session: Not on file  . Stress: Not on file  Relationships  . Social connections:    Talks on phone: Not on file    Gets together: Not on file    Attends religious service: Not on  file    Active member of club or organization: Not on file    Attends meetings of clubs or organizations: Not on file    Relationship status: Not on file  . Intimate partner violence:    Fear of current or ex partner: Not on file    Emotionally abused: Not on file    Physically abused: Not on file    Forced sexual activity: Not on file  Other Topics Concern  . 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    Current Meds  Medication Sig  . amLODipine (NORVASC) 5 MG tablet Take 1 tablet (5 mg total) by mouth daily.  Marland Kitchen aspirin (ASPIR-LOW) 81 MG EC tablet Take 81 mg by mouth daily.   . baclofen (LIORESAL) 10 MG tablet Take 1 tablet (10 mg total) by mouth at bedtime.  . diphenhydrAMINE (BENADRYL) 25 MG tablet Take 25 mg by mouth daily as needed for itching, allergies or sleep.  Marland Kitchen erythromycin ophthalmic ointment   . estrogens, conjugated, (PREMARIN) 0.625 MG tablet Take 0.625 mg by mouth daily. Take  daily for 21 days then do not take for 7 days.  . LUBRICANT EYE DROPS 0.4-0.3 % SOLN   . Multiple Vitamins-Minerals (MULTIVITAMIN WITH MINERALS) tablet Take 1 tablet by mouth daily.   . nitrofurantoin (MACRODANTIN) 50 MG capsule Take 1 capsule (50 mg total) by mouth every other day.  . Omega-3 Fatty Acids (OMEGA-3 FISH OIL PO) Take 1 capsule by mouth daily.   Marland Kitchen omeprazole (PRILOSEC) 20 MG capsule Take 1 capsule (20 mg total) by mouth daily as needed. For acid reflux  . oxybutynin (DITROPAN) 5 MG tablet Take 2 tablets (10 mg total) by mouth daily.  . rosuvastatin (CRESTOR) 20 MG tablet Take 1 tablet (20 mg total) by mouth daily.  . traZODone (DESYREL) 150 MG tablet Take 1 tablet (150 mg total) by mouth at bedtime.  . triamcinolone cream (KENALOG) 0.1 % APPLY TWICE DAILY TO AFFECTED RASH AS NEEDED   Allergies  Allergen Reactions  . Shellfish Allergy   . Sulfa Antibiotics     hives  . Sulfonamide Derivatives    Recent Results (from the past 2160 hour(s))  Vitamin B12     Status: None   Collection Time: 02/27/18 12:04 PM  Result Value Ref Range   Vitamin B-12 401 232 - 1,245 pg/mL  Sedimentation rate     Status: None   Collection Time: 02/27/18 12:04 PM  Result Value Ref Range   Sed Rate 2 0 - 40 mm/hr  Angiotensin converting enzyme     Status: None   Collection Time: 02/27/18 12:04 PM  Result Value Ref Range   Angio Convert Enzyme 54 14 - 82 U/L  ANA w/Reflex     Status: None   Collection Time: 02/27/18 12:04 PM  Result Value Ref Range   Anti Nuclear Antibody(ANA) Negative Negative  Multiple Myeloma Panel (SPEP&IFE w/QIG)     Status: None   Collection Time: 02/27/18 12:04 PM  Result Value Ref Range   IgG (Immunoglobin G), Serum 1,015 700 - 1,600 mg/dL   IgA/Immunoglobulin A, Serum 182 64 - 422 mg/dL   IgM (Immunoglobulin M), Srm 35 26 - 217 mg/dL   Total Protein 7.1 6.0 - 8.5 g/dL   Albumin SerPl Elph-Mcnc 3.8 2.9 - 4.4 g/dL   Alpha 1 0.3 0.0 - 0.4 g/dL   Alpha2 Glob SerPl  Elph-Mcnc 0.9 0.4 - 1.0 g/dL  B-Globulin SerPl Elph-Mcnc 1.2 0.7 - 1.3 g/dL   Gamma Glob SerPl Elph-Mcnc 1.0 0.4 - 1.8 g/dL   M Protein SerPl Elph-Mcnc Not Observed Not Observed g/dL   Globulin, Total 3.3 2.2 - 3.9 g/dL   Albumin/Glob SerPl 1.2 0.7 - 1.7   IFE 1 Comment     Comment: An apparent normal immunofixation pattern.   Please Note Comment     Comment: Protein electrophoresis scan will follow via computer, mail, or courier delivery.   B. burgdorfi antibodies     Status: None   Collection Time: 02/27/18 12:04 PM  Result Value Ref Range   Lyme IgG/IgM Ab <0.91 0.00 - 0.90 ISR    Comment:                                 Negative         <0.91                                 Equivocal  0.91 - 1.09                                 Positive         >1.09   HgB A1c     Status: None   Collection Time: 04/02/18 11:57 AM  Result Value Ref Range   Hgb A1c MFr Bld 5.8 4.6 - 6.5 %    Comment: Glycemic Control Guidelines for People with Diabetes:Non Diabetic:  <6%Goal of Therapy: <7%Additional Action Suggested:  >0%   Basic Metabolic Panel (BMET)     Status: Abnormal   Collection Time: 04/02/18 11:57 AM  Result Value Ref Range   Sodium 139 135 - 145 mEq/L   Potassium 4.2 3.5 - 5.1 mEq/L   Chloride 103 96 - 112 mEq/L   CO2 29 19 - 32 mEq/L   Glucose, Bld 115 (H) 70 - 99 mg/dL   BUN 16 6 - 23 mg/dL   Creatinine, Ser 0.82 0.40 - 1.20 mg/dL   Calcium 9.6 8.4 - 10.5 mg/dL   GFR 72.63 >60.00 mL/min   Objective  Body mass index is 29.29 kg/m. Wt Readings from Last 3 Encounters:  04/17/18 187 lb (84.8 kg)  04/02/18 184 lb 12.8 oz (83.8 kg)  02/27/18 184 lb (83.5 kg)   Temp Readings from Last 3 Encounters:  04/17/18 98.3 F (36.8 C) (Oral)  04/02/18 (!) 97.4 F (36.3 C) (Oral)  12/23/17 (!) 97.5 F (36.4 C)   BP Readings from Last 3 Encounters:  04/17/18 126/62  04/02/18 130/70  02/27/18 (!) 168/84   Pulse Readings from Last 3 Encounters:  04/17/18 69  04/02/18 69    02/27/18 83    Physical Exam  Constitutional: She is oriented to person, place, and time. Vital signs are normal. She appears well-developed and well-nourished. She is cooperative.  HENT:  Head: Normocephalic and atraumatic.  Mouth/Throat: Oropharynx is clear and moist and mucous membranes are normal.  Eyes: Pupils are equal, round, and reactive to light. Conjunctivae are normal.  Cardiovascular: Normal rate, regular rhythm and normal heart sounds.  Pulmonary/Chest: Effort normal and breath sounds normal.  Musculoskeletal:       Feet:  Neurological: She is alert and oriented to person, place, and time. Gait normal.  Skin: Skin is warm,  dry and intact.  Tanned skin   Psychiatric: She has a normal mood and affect. Her speech is normal and behavior is normal. Judgment and thought content normal. Cognition and memory are normal.  Nursing note and vitals reviewed.   Assessment   1. Left 3rd toe lesion no evidence of infection today ? Blister vs corn/callus with swelling and overlying blister 0.5 cm improving. No c/w gout maybe underlying arthritis changes  2. Prediabetes  Plan   1.  Xray left 3rd toe today  Bactroban, cont warm soaks with epsolm salt  If not better consider podiatry referral  2. Reviewed labs today f/u with PCP given copy Provider: Dr. Olivia Mackie McLean-Scocuzza-[facility]

## 2018-04-17 NOTE — Progress Notes (Signed)
Pre visit review using our clinic review tool, if applicable. No additional management support is needed unless otherwise documented below in the visit note. 

## 2018-04-17 NOTE — Patient Instructions (Signed)
Try warm soaks with epsolm salt and soapy antibacterial soap   Corns and Calluses Corns are small areas of thickened skin that occur on the top, sides, or tip of a toe. They contain a cone-shaped core with a point that can press on a nerve below. This causes pain. Calluses are areas of thickened skin that can occur anywhere on the body including hands, fingers, palms, soles of the feet, and heels.Calluses are usually larger than corns. What are the causes? Corns and calluses are caused by rubbing (friction) or pressure, such as from shoes that are too tight or do not fit properly. What increases the risk? Corns are more likely to develop in people who have toe deformities, such as hammer toes. Since calluses can occur with friction to any area of the skin, calluses are more likely to develop in people who:  Work with their hands.  Wear shoes that fit poorly, shoes that are too tight, or shoes that are high-heeled.  Have toes deformities.  What are the signs or symptoms? Symptoms of a corn or callus include:  A hard growth on the skin.  Pain or tenderness under the skin.  Redness and swelling.  Increased discomfort while wearing tight-fitting shoes.  How is this diagnosed? Corns and calluses may be diagnosed with a medical history and physical exam. How is this treated? Corns and calluses may be treated with:  Removing the cause of the friction or pressure. This may include: ? Changing your shoes. ? Wearing shoe inserts (orthotics) or other protective layers in your shoes, such as a corn pad. ? Wearing gloves.  Medicines to help soften skin in the hardened, thickened areas.  Reducing the size of the corn or callus by removing the dead layers of skin.  Antibiotic medicines to treat infection.  Surgery, if a toe deformity is the cause.  Follow these instructions at home:  Take medicines only as directed by your health care provider.  If you were prescribed an  antibiotic, finish all of it even if you start to feel better.  Wear shoes that fit well. Avoid wearing high-heeled shoes and shoes that are too tight or too loose.  Wear any padding, protective layers, gloves, or orthotics as directed by your health care provider.  Soak your hands or feet and then use a file or pumice stone to soften your corn or callus. Do this as directed by your health care provider.  Check your corn or callus every day for signs of infection. Watch for: ? Redness, swelling, or pain. ? Fluid, blood, or pus. Contact a health care provider if:  Your symptoms do not improve with treatment.  You have increased redness, swelling, or pain at the site of your corn or callus.  You have fluid, blood, or pus coming from your corn or callus.  You have new symptoms. This information is not intended to replace advice given to you by your health care provider. Make sure you discuss any questions you have with your health care provider. Document Released: 06/01/2004 Document Revised: 03/15/2016 Document Reviewed: 08/22/2014 Elsevier Interactive Patient Education  2018 [place], Adult A blister is a raised bubble of skin filled with liquid. Blisters often develop in an area of the skin that repeatedly rubs or presses against another surface (friction blister). Friction blisters can occur on any part of the body, but they usually develop on the hands or feet. Long-term pressure on the same area of the skin can also lead  to areas of hardened skin (calluses). What are the causes? A blister can be caused by:  An injury.  A burn.  An allergic reaction.  An infection.  Exposure to irritating chemicals.  Friction, especially in an area with a lot of heat and moisture.  Friction blisters often result from:  Sports.  Repetitive activities.  Using tools and doing other activities without wearing gloves.  Shoes that are too tight or too loose.  What are  the signs or symptoms? A blister is often round and looks like a bump. It may:  Itch.  Be painful to the touch.  Before a blister forms, the skin may:  Become red.  Feel warm.  Itch.  Be painful to the touch.  How is this diagnosed? A blister is diagnosed with a physical exam. How is this treated? Treatment usually involves protecting the area where the blister has formed until the skin has healed. Other treatments may include:  A bandage (dressing) to cover the blister.  Extra padding around and over the blister, so that it does not rub on anything.  Antibiotic ointment.  Most blisters break open, dry up, and go away on their own within 1-2 weeks. Blisters that are very painful may be drained before they break open on their own. If the blister is large or painful, it can be drained by: 1. Sterilizing a small needle with rubbing alcohol. 2. Washing your hands with soap and water. 3. Inserting the needle in the edge of the blister to make a small hole. Some fluid will drain out of the hole. Let the top or roof of the blister stay in place. This helps the skin heal. 4. Washing the blister with mild soap and water. 5. Covering the blister with antibiotic ointment, if prescribed by your health care provider, and a dressing.  Some blisters may need to be drained by a health care provider. Follow these instructions at home:  Protect the area where the blister has formed as told by your health care provider.  Keep your blister clean and dry. This helps to prevent infection.  Do not pop your blister. This can cause infection.  If you were prescribed an antibiotic, use it as told by your health care provider. Do not stop using the antibiotic even if your condition improves.  Wear different shoes until the blister heals.  Avoid the activity that caused the blister until your blister heals.  Check your blister every day for signs of infection. Check for: ? More redness,  swelling, or pain. ? More fluid or blood. ? Warmth. ? Pus or a bad smell. ? The blister getting better and then getting worse. How is this prevented? Taking these steps can help to prevent blisters that are caused by friction. Make sure you:  Wear comfortable shoes that fit well.  Always wear socks with shoes.  Wear extra socks or use tape, bandages, or pads over blister-prone areas as needed. You may also apply petroleum jelly under bandages in blister-prone areas.  Wear protective gear, such as gloves, when participating in sports or activities that can cause blisters.  Wear loose-fitting, moisture-wicking clothes when participating in sports or activities.  Use powders as needed to keep your feet dry.  Contact a health care provider if:  You have more redness, swelling, or pain around your blister.  You have more fluid or blood coming from your blister.  Your blister feels warm to the touch.  You have pus or a bad  smell coming from your blister.  You have a fever or chills.  Your blister gets better and then it gets worse. This information is not intended to replace advice given to you by your health care provider. Make sure you discuss any questions you have with your health care provider. Document Released: 10/03/2004 Document Revised: 04/24/2016 Document Reviewed: 03/08/2016 Elsevier Interactive Patient Education  2018 [employer].

## 2018-04-22 ENCOUNTER — Encounter: Payer: Self-pay | Admitting: Neurology

## 2018-04-22 ENCOUNTER — Ambulatory Visit: Payer: Medicare HMO | Admitting: Neurology

## 2018-04-22 ENCOUNTER — Ambulatory Visit (INDEPENDENT_AMBULATORY_CARE_PROVIDER_SITE_OTHER): Payer: Medicare HMO | Admitting: Neurology

## 2018-04-22 DIAGNOSIS — G629 Polyneuropathy, unspecified: Secondary | ICD-10-CM

## 2018-04-22 DIAGNOSIS — R202 Paresthesia of skin: Secondary | ICD-10-CM

## 2018-04-22 NOTE — Progress Notes (Signed)
[place]    Nerve / Sites Muscle Latency Ref. Amplitude Ref. Rel Amp Segments Distance Velocity Ref. Area    ms ms mV mV %  cm m/s m/s mVms  R Median - APB     Wrist APB 3.5 ?4.4 3.0 ?4.0 100 Wrist - APB 7   9.8     Upper arm APB 7.3  2.6  87.8 Upper arm - Wrist 20 53 ?49 9.9  R Ulnar - ADM     Wrist ADM 2.8 ?3.3 9.1 ?6.0 100 Wrist - ADM 7   28.3     B.Elbow ADM 6.1  8.1  89.6 B.Elbow - Wrist 18 54 ?49 27.3     A.Elbow ADM 8.0  8.4  103 A.Elbow - B.Elbow 10 53 ?49 27.3         A.Elbow - Wrist      R Peroneal - EDB     Ankle EDB 6.4 ?6.5 1.3 ?2.0 100 Ankle - EDB 9   3.0     Fib head EDB 13.4  1.0  77.3 Fib head - Ankle 31 44 ?44 2.6     Pop fossa EDB 15.6  0.4  42.8 Pop fossa - Fib head 10 45 ?44 0.7         Pop fossa - Ankle      L Peroneal - EDB     Ankle EDB 5.7 ?6.5 0.5 ?2.0 100 Ankle - EDB 9   2.2     Fib head EDB 12.8  0.7  135 Fib head - Ankle 31 44 ?44 2.3     Pop fossa EDB 15.1  0.5  71.3 Pop fossa - Fib head 10 44 ?44 2.0         Pop fossa - Ankle      R Tibial - AH     Ankle AH 4.7 ?5.8 9.2 ?4.0 100 Ankle - AH 9   21.2     Pop fossa AH 13.8  5.6  60.3 Pop fossa - Ankle 38 42 ?41 15.2  L Tibial - AH     Ankle AH 4.5 ?5.8 6.8 ?4.0 100 Ankle - AH 9   17.0     Pop fossa AH 12.9  4.7  69.5 Pop fossa - Ankle 38 45 ?41 14.3                 SNC    Nerve / Sites Rec. Site Peak Lat Ref.  Amp Ref. Segments Distance    ms ms V V  cm  R Sural - Ankle (Calf)     Calf Ankle 4.4 ?4.4 3 ?6 Calf - Ankle 14  L Sural - Ankle (Calf)     Calf Ankle 4.5 ?4.4 3 ?6 Calf - Ankle 14  R Superficial peroneal - Ankle     Lat leg Ankle 4.5 ?4.4 3 ?6 Lat leg - Ankle 14  L Superficial peroneal - Ankle     Lat leg Ankle 4.1 ?4.4 3 ?6 Lat leg - Ankle 14  R Median - Orthodromic (Dig II, Mid palm)     Dig II Wrist 3.4 ?3.4 6 ?10 Dig II - Wrist 13  R Ulnar - Orthodromic, (Dig V, Mid palm)     Dig V Wrist 3.0 ?3.1 6 ?5 Dig V - Wrist 49                  F  Wave    Nerve F Lat Ref.  ms ms  R Tibial  - AH 52.1 ?56.0  L Tibial - AH 52.5 ?56.0  R Ulnar - ADM 27.0 ?32.0

## 2018-04-22 NOTE — Progress Notes (Signed)
Please refer to EMG and nerve conduction study procedure note. 

## 2018-04-22 NOTE — Procedures (Signed)
     HISTORY:  Monica Larson is a 72 year old patient with a history of a sunburn type feeling and numbness in the feet and some difficulty with muscle cramps in the legs at night.  The patient is not having a lot of back pain or pain down the legs.  She is being evaluated for a possible neuropathy or a lumbosacral radiculopathy.  NERVE CONDUCTION STUDIES:  Nerve conduction studies were performed on the right upper extremity.  The distal motor latencies for the right median and ulnar nerves were normal with a low motor amplitude for the right median nerve, normal for the right ulnar nerve.  The nerve conduction velocities for the right median and ulnar nerves were normal.  The sensory latencies for the right median and ulnar nerves were normal.  The F-wave latency for the right ulnar nerve was normal.  Nerve conduction studies were performed on both lower extremities.  The distal motor latencies for the peroneal and posterior tibial nerves were normal bilaterally with low motor amplitudes seen for the peroneal nerves bilaterally, normal for the posterior tibial nerves bilaterally.  Nerve conduction velocities for the peroneal and posterior tibial nerves were normal bilaterally.  The sensory latencies for the sural nerves were prolonged bilaterally.  The sensory latencies for the peroneal nerves were prolonged on the right, normal on the left.  The F-wave latencies for the posterior tibial nerves were within normal limits bilaterally.  EMG STUDIES:  EMG study was performed on the right lower extremity:  The tibialis anterior muscle reveals 2 to 4K motor units with full recruitment. No fibrillations or positive waves were seen. The peroneus tertius muscle reveals 2 to 4K motor units with full recruitment. No fibrillations or positive waves were seen. The medial gastrocnemius muscle reveals 1 to 3K motor units with full recruitment. No fibrillations or positive waves were seen. The vastus lateralis  muscle reveals 2 to 4K motor units with full recruitment. No fibrillations or positive waves were seen. The iliopsoas muscle reveals 2 to 4K motor units with full recruitment. No fibrillations or positive waves were seen. The biceps femoris muscle (long head) reveals 2 to 4K motor units with full recruitment. No fibrillations or positive waves were seen. The lumbosacral paraspinal muscles were tested at 3 levels, and revealed no abnormalities of insertional activity at all 3 levels tested. There was good relaxation.   IMPRESSION:  Nerve conduction studies done on the right upper extremity and both lower extremities shows findings consistent with a mild to moderate primarily axonal peripheral neuropathy.  EMG evaluation of the right lower extremity was unremarkable without evidence of an overlying lumbosacral radiculopathy.  Jill Alexanders MD 04/22/2018 4:46 PM  [facility] [address] [place] [employer], [place] [address]  Phone [phone] Fax [phone]

## 2018-04-24 DIAGNOSIS — H26491 Other secondary cataract, right eye: Secondary | ICD-10-CM | POA: Diagnosis not present

## 2018-04-30 ENCOUNTER — Telehealth: Payer: Self-pay | Admitting: Neurology

## 2018-04-30 MED ORDER — GABAPENTIN 300 MG PO CAPS: 300.0000 mg | ORAL_CAPSULE | Freq: Two times a day (BID) | ORAL | 3 refills | Status: DC

## 2018-04-30 NOTE — Telephone Encounter (Signed)
I called the patient.  The patient has a peripheral neuropathy, she has burning in the feet day and night, we will add gabapentin taking 300 mg twice daily, she will call for any dose adjustments.  She is still having nocturnal leg cramps, the 10 mg back on the dose is not effective, this can be increased, she can go to 20 mg at night.

## 2018-04-30 NOTE — Telephone Encounter (Signed)
Pt said baclofen (LIORESAL) 10 MG tablet is not working. She would like Neurontin or something in that family. Please call to advise

## 2018-05-22 DIAGNOSIS — L309 Dermatitis, unspecified: Secondary | ICD-10-CM | POA: Diagnosis not present

## 2018-05-22 DIAGNOSIS — L111 Transient acantholytic dermatosis [Grover]: Secondary | ICD-10-CM | POA: Diagnosis not present

## 2018-06-18 ENCOUNTER — Telehealth: Payer: Self-pay | Admitting: Neurology

## 2018-06-18 MED ORDER — GABAPENTIN 100 MG PO CAPS: ORAL_CAPSULE | ORAL | 1 refills | Status: DC

## 2018-06-18 NOTE — Addendum Note (Signed)
Addended by: Kathrynn Ducking on: 06/18/2018 02:03 PM   Modules accepted: Orders

## 2018-06-18 NOTE — Telephone Encounter (Signed)
Pt requesting a call stating she is unable to take medication gabapentin (NEURONTIN) 300 MG capsule during the day due to s/e-- lack of coordination. Would like to discuss a different medication. Please advise

## 2018-06-18 NOTE — Telephone Encounter (Signed)
I called the patient.  The 300 mg dose of gabapentin in the morning makes her staggery.  She is taking 300 mg at night and does fairly well with this.  We will convert to the 100 mg capsules taking 1 in the morning and 3 in the evening.  I will send in a prescription for this.

## 2018-06-18 NOTE — Telephone Encounter (Signed)
What do you suggest?

## 2018-06-24 ENCOUNTER — Ambulatory Visit: Payer: Medicare HMO | Admitting: Family Medicine

## 2018-07-01 DIAGNOSIS — L111 Transient acantholytic dermatosis [Grover]: Secondary | ICD-10-CM | POA: Diagnosis not present

## 2018-07-01 DIAGNOSIS — X32XXXA Exposure to sunlight, initial encounter: Secondary | ICD-10-CM | POA: Diagnosis not present

## 2018-07-01 DIAGNOSIS — L821 Other seborrheic keratosis: Secondary | ICD-10-CM | POA: Diagnosis not present

## 2018-07-01 DIAGNOSIS — Z08 Encounter for follow-up examination after completed treatment for malignant neoplasm: Secondary | ICD-10-CM | POA: Diagnosis not present

## 2018-07-01 DIAGNOSIS — L570 Actinic keratosis: Secondary | ICD-10-CM | POA: Diagnosis not present

## 2018-07-01 DIAGNOSIS — Z85828 Personal history of other malignant neoplasm of skin: Secondary | ICD-10-CM | POA: Diagnosis not present

## 2018-07-01 DIAGNOSIS — D485 Neoplasm of uncertain behavior of skin: Secondary | ICD-10-CM | POA: Diagnosis not present

## 2018-07-14 DIAGNOSIS — R339 Retention of urine, unspecified: Secondary | ICD-10-CM | POA: Diagnosis not present

## 2018-07-14 DIAGNOSIS — N35919 Unspecified urethral stricture, male, unspecified site: Secondary | ICD-10-CM | POA: Diagnosis not present

## 2018-07-28 ENCOUNTER — Ambulatory Visit (INDEPENDENT_AMBULATORY_CARE_PROVIDER_SITE_OTHER): Payer: Medicare HMO | Admitting: Family Medicine

## 2018-07-28 ENCOUNTER — Encounter: Payer: Self-pay | Admitting: Family Medicine

## 2018-07-28 VITALS — BP 140/80 | HR 77 | Temp 98.0°F | Ht 67.0 in | Wt 188.8 lb

## 2018-07-28 DIAGNOSIS — J329 Chronic sinusitis, unspecified: Secondary | ICD-10-CM | POA: Insufficient documentation

## 2018-07-28 DIAGNOSIS — Z1239 Encounter for other screening for malignant neoplasm of breast: Secondary | ICD-10-CM

## 2018-07-28 DIAGNOSIS — N649 Disorder of breast, unspecified: Secondary | ICD-10-CM | POA: Diagnosis not present

## 2018-07-28 DIAGNOSIS — I10 Essential (primary) hypertension: Secondary | ICD-10-CM | POA: Diagnosis not present

## 2018-07-28 DIAGNOSIS — G629 Polyneuropathy, unspecified: Secondary | ICD-10-CM

## 2018-07-28 DIAGNOSIS — E782 Mixed hyperlipidemia: Secondary | ICD-10-CM | POA: Diagnosis not present

## 2018-07-28 DIAGNOSIS — J0100 Acute maxillary sinusitis, unspecified: Secondary | ICD-10-CM | POA: Diagnosis not present

## 2018-07-28 DIAGNOSIS — L988 Other specified disorders of the skin and subcutaneous tissue: Secondary | ICD-10-CM

## 2018-07-28 DIAGNOSIS — R600 Localized edema: Secondary | ICD-10-CM | POA: Diagnosis not present

## 2018-07-28 LAB — CBC
HCT: 42.9 % (ref 36.0–46.0)
Hemoglobin: 14.5 g/dL (ref 12.0–15.0)
MCHC: 33.8 g/dL (ref 30.0–36.0)
MCV: 91.6 fl (ref 78.0–100.0)
Platelets: 232 10*3/uL (ref 150.0–400.0)
RBC: 4.69 Mil/uL (ref 3.87–5.11)
RDW: 13.5 % (ref 11.5–15.5)
WBC: 6.8 10*3/uL (ref 4.0–10.5)

## 2018-07-28 LAB — COMPREHENSIVE METABOLIC PANEL
ALBUMIN: 4.4 g/dL (ref 3.5–5.2)
ALK PHOS: 72 U/L (ref 39–117)
ALT: 17 U/L (ref 0–35)
AST: 18 U/L (ref 0–37)
BILIRUBIN TOTAL: 0.5 mg/dL (ref 0.2–1.2)
BUN: 14 mg/dL (ref 6–23)
CALCIUM: 9.5 mg/dL (ref 8.4–10.5)
CO2: 29 mEq/L (ref 19–32)
CREATININE: 0.8 mg/dL (ref 0.40–1.20)
Chloride: 105 mEq/L (ref 96–112)
GFR: 74.66 mL/min (ref >60.00)
Glucose, Bld: 106 mg/dL — ABNORMAL HIGH (ref 70–99)
Potassium: 4.2 mEq/L (ref 3.5–5.1)
SODIUM: 141 meq/L (ref 135–145)
Total Protein: 7.3 g/dL (ref 6.0–8.3)

## 2018-07-28 LAB — TSH: TSH: 0.99 u[IU]/mL (ref 0.35–4.50)

## 2018-07-28 MED ORDER — AMOXICILLIN-POT CLAVULANATE 875-125 MG PO TABS: 1.0000 | ORAL_TABLET | Freq: Two times a day (BID) | ORAL | 0 refills | Status: DC

## 2018-07-28 NOTE — Assessment & Plan Note (Signed)
Treating with Augmentin. 

## 2018-07-28 NOTE — Assessment & Plan Note (Signed)
This appears to be a seborrheic keratosis.  I discussed referral to her dermatologist versus her surgeon who previously did her excisional biopsies.  She preferred to start with her dermatologist.  Referral was placed.  Patient will call them to set up an appointment as well.

## 2018-07-28 NOTE — Assessment & Plan Note (Signed)
I suspect this is related to her amlodipine versus venous insufficiency.  We will check lab work as outlined below.  Consider changing amlodipine.

## 2018-07-28 NOTE — Progress Notes (Signed)
[name], MD Phone: [phone]  Monica Larson is a 72 y.o. female who presents today for f/u.  CC: sinus congestion, htn, hld, neuropathy  Sinus congestion: This has been going on for 1.5 weeks.  She notes maxillary sinus congestion with yellow nasal discharge.  Some postnasal drip and cough.  Cough is productive of yellow mucus.  She notes no fevers.  No shortness of breath.  Her eyes have been watering with this.  Hypertension: Taking amlodipine up to 1.5 tablets at a time.  She notes her blood pressure occasionally goes up to 737 systolically and that is when she takes the extra half a tablet.  She notes no chest pain or shortness of breath.  She does have bilateral lower extremity edema that comes on by the afternoon.  Worsens the longer she is on her feet.  Goes down overnight.  Has been going on for 1 month.  No orthopnea or PND.  Hyperlipidemia: Taking Crestor.  No right upper quadrant pain or myalgias.  Neuropathy: Notes she sees neurology for this Thursday.  She had prior evaluation and they placed her on gabapentin.  This makes her feel like she is drunk.  It did help with her symptoms at night.  Patient reports she is due for mammogram.  She has noted no breast changes.  She has had excisional biopsies on 2 occasions for benign lesions.  Social History   Tobacco Use  Smoking Status Never Smoker  Smokeless Tobacco Never Used  Tobacco Comment   1 cig every 3 months      ROS see history of present illness  Objective  Physical Exam Vitals:   07/28/18 1111  BP: 140/80  Pulse: 77  Temp: 98 F (36.7 C)  SpO2: 94%    BP Readings from Last 3 Encounters:  07/28/18 140/80  04/17/18 126/62  04/02/18 130/70   Wt Readings from Last 3 Encounters:  07/28/18 188 lb 12.8 oz (85.6 kg)  04/17/18 187 lb (84.8 kg)  04/02/18 184 lb 12.8 oz (83.8 kg)    Physical Exam  Constitutional: No distress.  Cardiovascular: Normal rate, regular rhythm and normal heart sounds.    Pulmonary/Chest: Effort normal and breath sounds normal.  Genitourinary:  Genitourinary Comments: Chaperone used, right breast with scar on the lateral portion of the aereola, also scar in the upper inner quadrant, there is no nipple inversion, tenderness, or mass, there appears to be a seborrheic keratosis on her right nipple the patient is unsure how long this is been present, left breast with no apparent scar, mass, nipple inversion, or tenderness, no axillary masses bilaterally  Musculoskeletal: She exhibits edema (Trace to lower shin).  Neurological: She is alert.  Skin: Skin is warm and dry. She is not diaphoretic.     Assessment/Plan: Please see individual problem list.  Hypertension Generally well controlled.  She will continue her current regimen for now.  She does report that her amlodipine breaks apart easily.  We will check with our clinical pharmacist regarding this.  Neuropathy She will continue to see neurology.  Could consider Lyrica given gabapentin causes her to feel drunk.  Mixed hyperlipidemia Continue Crestor.  Skin lesion of breast This appears to be a seborrheic keratosis.  I discussed referral to her dermatologist versus her surgeon who previously did her excisional biopsies.  She preferred to start with her dermatologist.  Referral was placed.  Patient will call them to set up an appointment as well.  Bilateral leg edema I suspect this  is related to her amlodipine versus venous insufficiency.  We will check lab work as outlined below.  Consider changing amlodipine.  Sinusitis Treating with Augmentin.  Health Maintenance: Mammogram ordered.  Patient will call to schedule.  Orders Placed This Encounter  Procedures  . MM 3D SCREEN BREAST BILATERAL    Standing Status:   Future    Standing Expiration Date:   09/28/2019    Order Specific Question:   Reason for Exam (SYMPTOM  OR DIAGNOSIS REQUIRED)    Answer:   breast cancer screening    Order Specific  Question:   Preferred imaging location?    Answer:   [facility]  . Comp Met (CMET)  . CBC  . TSH  . Ambulatory referral to Dermatology    Referral Priority:   Routine    Referral Type:   Consultation    Referral Reason:   Specialty Services Required    Requested Specialty:   Dermatology    Number of Visits Requested:   1    Meds ordered this encounter  Medications  . amoxicillin-clavulanate (AUGMENTIN) 875-125 MG tablet    Sig: Take 1 tablet by mouth 2 (two) times daily.    Dispense:  14 tablet    Refill:  0     [name], MD [place]

## 2018-07-28 NOTE — Assessment & Plan Note (Signed)
Generally well controlled.  She will continue her current regimen for now.  She does report that her amlodipine breaks apart easily.  We will check with our clinical pharmacist regarding this.

## 2018-07-28 NOTE — Patient Instructions (Signed)
Nice to see you. We will check lab work today and contact you with the results. Please call to schedule your mammogram. We will treat your sinus infection with Augmentin. If those symptoms do not improve please let us know. We will have you see Dr. Bary Castilla for the skin lesion on your right nipple.

## 2018-07-28 NOTE — Assessment & Plan Note (Signed)
She will continue to see neurology.  Could consider Lyrica given gabapentin causes her to feel drunk.

## 2018-07-28 NOTE — Assessment & Plan Note (Signed)
Continue Crestor 

## 2018-07-30 ENCOUNTER — Other Ambulatory Visit: Payer: Self-pay

## 2018-07-30 ENCOUNTER — Ambulatory Visit: Payer: Medicare HMO | Admitting: Neurology

## 2018-07-30 ENCOUNTER — Encounter: Payer: Self-pay | Admitting: Neurology

## 2018-07-30 VITALS — BP 146/80 | HR 78 | Resp 16 | Ht 67.0 in | Wt 187.0 lb

## 2018-07-30 DIAGNOSIS — G629 Polyneuropathy, unspecified: Secondary | ICD-10-CM

## 2018-07-30 MED ORDER — DULOXETINE HCL 30 MG PO CPEP: 30.0000 mg | ORAL_CAPSULE | Freq: Every day | ORAL | 3 refills | Status: DC

## 2018-07-30 NOTE — Patient Instructions (Signed)
Stop the gabapentin and start Cymbalta 30 mg daily.  Cymbalta (duloxetine) is an antidepressant medication that is commonly used for peripheral neuropathy pain or for fibromyalgia pain. As with any antidepressant medication, worsening depression can be seen. This medication can potentially cause headache, dizziness, sexual dysfunction, or nausea. If any problems are noted on this medication, please contact our office.

## 2018-07-30 NOTE — Progress Notes (Signed)
Reason for visit: Peripheral neuropathy  Monica Larson is an 72 y.o. female  History of present illness:  Monica Larson is a 72 year old right-handed white female with a history of a peripheral neuropathy and a history of some nocturnal leg cramps.  The patient has gained benefit with the gabapentin taking 300 mg at night, she cannot take even 100 mg during the day as it makes her somewhat unstable with her walking.  The patient is also having some swelling in the ankles.  The patient is able to sleep fairly well at night only gabapentin.  She returns to this office for an evaluation.  She has not had any falls since last seen.  Past Medical History:  Diagnosis Date  . Arthritis    HANDS/NECK/SHOULDERS  . Asthma    SEASONAL  . Diarrhea    INTERMITTENT CHRONIC  . Fatigue   . GERD (gastroesophageal reflux disease)   . H/O motion sickness 06/17/2016  . Headache    SINUS/ ALLERGIES  . HLD (hyperlipidemia)    borderline  . HTN (hypertension)    CONTROLLED ON MEDS  . Motion sickness   . Seasonal allergies   . Shortness of breath dyspnea    ON EXERTION    Past Surgical History:  Procedure Laterality Date  . BREAST BIOPSY Bilateral    NEG  . BREAST EXCISIONAL BIOPSY Bilateral    NEG  . CARPAL TUNNEL RELEASE     right wrist  . CHOLECYSTECTOMY    . COLONOSCOPY N/A 01/31/2016   Procedure: COLONOSCOPY;  Surgeon: Hulen Luster, MD;  Location: [facility];  Service: Gastroenterology;  Laterality: N/A;  . EYE SURGERY     muscular as child/ CATARACT BILATERAL  . SKIN CANCER EXCISION Right Wrist and thigh (x2)   Unsure of dates and Dermatologist  . VESICOVAGINAL FISTULA CLOSURE W/ TAH      Family History  Problem Relation Age of Onset  . Ovarian cancer Mother   . Prostate cancer Father   . Coronary artery disease Father   . Diabetes Father   . Heart attack Father   . Heart attack Brother   . Breast cancer Neg Hx     Social history:  reports that she has never smoked. She  has never used smokeless tobacco. She reports that she drinks alcohol. She reports that she does not use drugs.    Allergies  Allergen Reactions  . Shellfish Allergy   . Sulfa Antibiotics     hives  . Sulfonamide Derivatives     Medications:  Prior to Admission medications   Medication Sig Start Date End Date Taking? Authorizing Provider  amLODipine (NORVASC) 5 MG tablet Take 1 tablet (5 mg total) by mouth daily. 12/23/17  Yes Leone Haven, MD  amoxicillin-clavulanate (AUGMENTIN) 875-125 MG tablet Take 1 tablet by mouth 2 (two) times daily. 07/28/18  Yes Leone Haven, MD  aspirin (ASPIR-LOW) 81 MG EC tablet Take 81 mg by mouth daily.    Yes [provider]  baclofen (LIORESAL) 10 MG tablet Take 1 tablet (10 mg total) by mouth at bedtime. 02/27/18  Yes Kathrynn Ducking, MD  diphenhydrAMINE (BENADRYL) 25 MG tablet Take 25 mg by mouth daily as needed for itching, allergies or sleep.   Yes [provider]  erythromycin ophthalmic ointment  03/19/18  Yes [provider]  estrogens, conjugated, (PREMARIN) 0.625 MG tablet Take 0.625 mg by mouth daily. Take daily for 21 days then do not  take for 7 days.   Yes [provider]  gabapentin (NEURONTIN) 100 MG capsule 1 capsule in the morning, 3 in the evening 06/18/18  Yes Kathrynn Ducking, MD  Multiple Vitamins-Minerals (MULTIVITAMIN WITH MINERALS) tablet Take 1 tablet by mouth daily.    Yes [provider]  Omega-3 Fatty Acids (OMEGA-3 FISH OIL PO) Take 1 capsule by mouth daily.    Yes [provider]  omeprazole (PRILOSEC) 20 MG capsule Take 1 capsule (20 mg total) by mouth daily as needed. For acid reflux 10/03/17  Yes Leone Haven, MD  oxybutynin (DITROPAN) 5 MG tablet Take 2 tablets (10 mg total) by mouth daily. 10/03/17  Yes Leone Haven, MD  rosuvastatin (CRESTOR) 20 MG tablet Take 1 tablet (20 mg total) by mouth daily. 10/15/17  Yes Leone Haven, MD  traZODone  (DESYREL) 150 MG tablet Take 1 tablet (150 mg total) by mouth at bedtime. 10/03/17  Yes Leone Haven, MD  triamcinolone cream (KENALOG) 0.1 % APPLY TWICE DAILY TO AFFECTED RASH AS NEEDED 03/03/18  Yes [provider]    ROS:  Out of a complete 14 system review of symptoms, the patient complains only of the following symptoms, and all other reviewed systems are negative.  Leg swelling Muscle cramps Environmental allergies Dizziness, numbness  Blood pressure (!) 146/80, pulse 78, resp. rate 16, height 5\' 7"  (1.702 m), weight 187 lb (84.8 kg).  Physical Exam  General: The patient is alert and cooperative at the time of the examination.  Skin: No significant peripheral edema is noted.   Neurologic Exam  Mental status: The patient is alert and oriented x 3 at the time of the examination. The patient has apparent normal recent and remote memory, with an apparently normal attention span and concentration ability.   Cranial nerves: Facial symmetry is present. Speech is normal, no aphasia or dysarthria is noted. Extraocular movements are full. Visual fields are full.  Motor: The patient has good strength in all 4 extremities.  Sensory examination: Soft touch sensation is symmetric on the face, arms, and legs.  Coordination: The patient has good finger-nose-finger and heel-to-shin bilaterally.  Gait and station: The patient has a normal gait. Tandem gait is slightly unsteady. Romberg is negative. No drift is seen.  Reflexes: Deep tendon reflexes are symmetric, but are depressed.   Assessment/Plan:  1.  Peripheral neuropathy  2.  Nocturnal leg cramps  The patient will be taken off of gabapentin, we will switch to Cymbalta taking 30 mg daily.  If this is well-tolerated but not effective, the patient will call for dose adjustments.  Otherwise, she will follow-up in 6 months.  The patient will continue the baclofen 10 mg at night.  Jill Alexanders MD 07/30/2018 11:55  AM  [facility] [address] [place], [place] [address]  Phone [phone] Fax [phone]

## 2018-08-03 ENCOUNTER — Telehealth: Payer: Self-pay | Admitting: Family Medicine

## 2018-08-03 DIAGNOSIS — L538 Other specified erythematous conditions: Secondary | ICD-10-CM | POA: Diagnosis not present

## 2018-08-03 DIAGNOSIS — L820 Inflamed seborrheic keratosis: Secondary | ICD-10-CM | POA: Diagnosis not present

## 2018-08-03 NOTE — Telephone Encounter (Signed)
Copied from [place] [id number]. Topic: Quick Communication - See Telephone Encounter >> Aug 03, 2018 11:29 AM Antonieta Iba C wrote: CRM for notification. See Telephone encounter for: 08/03/18.  Pt called in to be advised. Pt says that she was seen and prescribed amoxicillin-clavulanate (AUGMENTIN) 875-125 MG tablet, pt says that she feels better but she still hear crud in her chest. Pt says that she is completing medication today.   Please advise.   CB: [id number])  Pharmacy: [facility] [id number] - [address], [place] [phone] (Phone) [phone] (Fax)

## 2018-08-03 NOTE — Telephone Encounter (Signed)
Please advise 

## 2018-08-04 NOTE — Telephone Encounter (Signed)
Patient called stated she does not need to come back in because she was just seen a few weeks ago. Refused to schedule an appointment

## 2018-08-04 NOTE — Telephone Encounter (Signed)
Called and spoke with patient. Pt advised and voiced understanding.  

## 2018-08-04 NOTE — Telephone Encounter (Signed)
Just a FYI

## 2018-08-04 NOTE — Telephone Encounter (Signed)
Noted.  It is common to continue to have some symptoms that will progressively improve after treatment with an antibiotic.  Given that she is feeling improved from previously I would suggest she monitor as her symptoms should continue to improve.  If they do not continue to improve she will need to be reevaluated.  If she is having fevers, cough productive of blood, or shortness of breath she will need to be evaluated right away.

## 2018-08-04 NOTE — Telephone Encounter (Signed)
Sent to PCP to advise and suggest recommendations. Would you like pt to come back for another OV?

## 2018-08-12 ENCOUNTER — Telehealth: Payer: Self-pay | Admitting: Neurology

## 2018-08-12 NOTE — Telephone Encounter (Signed)
12/4- called patient to schedule 6 month follow up. Patient stated she wanted to try pain management and see how it works for her before she schedules follow up.

## 2018-08-14 ENCOUNTER — Ambulatory Visit: Payer: Medicare HMO | Admitting: Family Medicine

## 2018-08-14 DIAGNOSIS — H1789 Other corneal scars and opacities: Secondary | ICD-10-CM | POA: Diagnosis not present

## 2018-08-25 ENCOUNTER — Other Ambulatory Visit: Payer: Self-pay | Admitting: Family Medicine

## 2018-08-25 ENCOUNTER — Ambulatory Visit
Admission: RE | Admit: 2018-08-25 | Discharge: 2018-08-25 | Disposition: A | Discharge status: Home / Self Care | Payer: Medicare HMO | Source: Ambulatory Visit | Attending: Family Medicine | Admitting: Family Medicine

## 2018-08-25 DIAGNOSIS — Z1239 Encounter for other screening for malignant neoplasm of breast: Secondary | ICD-10-CM

## 2018-08-25 DIAGNOSIS — Z1231 Encounter for screening mammogram for malignant neoplasm of breast: Secondary | ICD-10-CM | POA: Diagnosis not present

## 2018-09-18 ENCOUNTER — Encounter: Payer: Self-pay | Admitting: Internal Medicine

## 2018-09-18 ENCOUNTER — Ambulatory Visit (INDEPENDENT_AMBULATORY_CARE_PROVIDER_SITE_OTHER): Payer: Medicare HMO | Admitting: Internal Medicine

## 2018-09-18 VITALS — BP 152/68 | HR 87 | Temp 97.9°F | Ht 67.0 in | Wt 186.2 lb

## 2018-09-18 DIAGNOSIS — N3281 Overactive bladder: Secondary | ICD-10-CM | POA: Diagnosis not present

## 2018-09-18 DIAGNOSIS — I10 Essential (primary) hypertension: Secondary | ICD-10-CM

## 2018-09-18 DIAGNOSIS — Z87448 Personal history of other diseases of urinary system: Secondary | ICD-10-CM | POA: Insufficient documentation

## 2018-09-18 DIAGNOSIS — N3000 Acute cystitis without hematuria: Secondary | ICD-10-CM | POA: Diagnosis not present

## 2018-09-18 MED ORDER — LOSARTAN POTASSIUM 25 MG PO TABS: 25.0000 mg | ORAL_TABLET | Freq: Every day | ORAL | 0 refills | Status: DC

## 2018-09-18 MED ORDER — CIPROFLOXACIN HCL 500 MG PO TABS: 500.0000 mg | ORAL_TABLET | Freq: Two times a day (BID) | ORAL | 0 refills | Status: DC

## 2018-09-18 NOTE — Patient Instructions (Addendum)
Dr. Olivia Mackie urology  Dr. Jairo Ben with [facility] daily [facility] in [place]    Losartan tablets What is this medicine? LOSARTAN (loe SAR tan) is used to treat high blood pressure and to reduce the risk of stroke in certain patients. This drug also slows the progression of kidney disease in patients with diabetes. This medicine may be used for other purposes; ask your health care provider or pharmacist if you have questions. COMMON BRAND NAME(S): Cozaar What should I tell my health care provider before I take this medicine? They need to know if you have any of these conditions: -heart failure -kidney or liver disease -an unusual or allergic reaction to losartan, other medicines, foods, dyes, or preservatives -pregnant or trying to get pregnant -breast-feeding How should I use this medicine? Take this medicine by mouth with a glass of water. Follow the directions on the prescription label. This medicine can be taken with or without food. Take your doses at regular intervals. Do not take your medicine more often than directed. Talk to your pediatrician regarding the use of this medicine in children. Special care may be needed. Overdosage: If you think you have taken too much of this medicine contact a poison control center or emergency room at once. NOTE: This medicine is only for you. Do not share this medicine with others. What if I miss a dose? If you miss a dose, take it as soon as you can. If it is almost time for your next dose, take only that dose. Do not take double or extra doses. What may interact with this medicine? -blood pressure medicines -diuretics, especially triamterene, spironolactone, or amiloride -fluconazole -NSAIDs, medicines for pain and inflammation, like ibuprofen or naproxen -potassium salts or potassium supplements -rifampin This list may not describe all possible interactions. Give your health care provider a  list of all the medicines, herbs, non-prescription drugs, or dietary supplements you use. Also tell them if you smoke, drink alcohol, or use illegal drugs. Some items may interact with your medicine. What should I watch for while using this medicine? Visit your doctor or health care professional for regular checks on your progress. Check your blood pressure as directed. Ask your doctor or health care professional what your blood pressure should be and when you should contact him or her. Call your doctor or health care professional if you notice an irregular or fast heart beat. Women should inform their doctor if they wish to become pregnant or think they might be pregnant. There is a potential for serious side effects to an unborn child, particularly in the second or third trimester. Talk to your health care professional or pharmacist for more information. You may get drowsy or dizzy. Do not drive, use machinery, or do anything that needs mental alertness until you know how this drug affects you. Do not stand or sit up quickly, especially if you are an older patient. This reduces the risk of dizzy or fainting spells. Alcohol can make you more drowsy and dizzy. Avoid alcoholic drinks. Avoid salt substitutes unless you are told otherwise by your doctor or health care professional. Do not treat yourself for coughs, colds, or pain while you are taking this medicine without asking your doctor or health care professional for advice. Some ingredients may increase your blood pressure. What side effects may I notice from receiving this medicine? Side effects that you should report to your doctor or health care professional as soon as  possible: -confusion, dizziness, light headedness or fainting spells -decreased amount of urine passed -difficulty breathing or swallowing, hoarseness, or tightening of the throat -fast or irregular heart beat, palpitations, or chest pain -skin rash, itching -swelling of your face,  lips, tongue, hands, or feet Side effects that usually do not require medical attention (report to your doctor or health care professional if they continue or are bothersome): -cough -decreased sexual function or desire -headache -nasal congestion or stuffiness -nausea or stomach pain -sore or cramping muscles This list may not describe all possible side effects. Call your doctor for medical advice about side effects. You may report side effects to FDA at 1-800-FDA-1088. Where should I keep my medicine? Keep out of the reach of children. Store at room temperature between 15 and 30 degrees C (59 and 86 degrees F). Protect from light. Keep container tightly closed. Throw away any unused medicine after the expiration date. NOTE: This sheet is a summary. It may not cover all possible information. If you have questions about this medicine, talk to your doctor, pharmacist, or health care provider.  2019 Elsevier/Gold Standard (2007-11-06 16:42:18)   Urinary Tract Infection, Adult  A urinary tract infection (UTI) is an infection of any part of the urinary tract. The urinary tract includes the kidneys, ureters, bladder, and urethra. These organs make, store, and get rid of urine in the body. Your health care provider may use other names to describe the infection. An upper UTI affects the ureters and kidneys (pyelonephritis). A lower UTI affects the bladder (cystitis) and urethra (urethritis). What are the causes? Most urinary tract infections are caused by bacteria in your genital area, around the entrance to your urinary tract (urethra). These bacteria grow and cause inflammation of your urinary tract. What increases the risk? You are more likely to develop this condition if:  You have a urinary catheter that stays in place (indwelling).  You are not able to control when you urinate or have a bowel movement (you have incontinence).  You are female and you: ? Use a spermicide or diaphragm for  birth control. ? Have low estrogen levels. ? Are pregnant.  You have certain genes that increase your risk (genetics).  You are sexually active.  You take antibiotic medicines.  You have a condition that causes your flow of urine to slow down, such as: ? An enlarged prostate, if you are female. ? Blockage in your urethra (stricture). ? A kidney stone. ? A nerve condition that affects your bladder control (neurogenic bladder). ? Not getting enough to drink, or not urinating often.  You have certain medical conditions, such as: ? Diabetes. ? A weak disease-fighting system (immunesystem). ? Sickle cell disease. ? Gout. ? Spinal cord injury. What are the signs or symptoms? Symptoms of this condition include:  Needing to urinate right away (urgently).  Frequent urination or passing small amounts of urine frequently.  Pain or burning with urination.  Blood in the urine.  Urine that smells bad or unusual.  Trouble urinating.  Cloudy urine.  Vaginal discharge, if you are female.  Pain in the abdomen or the lower back. You may also have:  Vomiting or a decreased appetite.  Confusion.  Irritability or tiredness.  A fever.  Diarrhea. The first symptom in older adults may be confusion. In some cases, they may not have any symptoms until the infection has worsened. How is this diagnosed? This condition is diagnosed based on your medical history and a physical exam. You may  also have other tests, including:  Urine tests.  Blood tests.  Tests for sexually transmitted infections (STIs). If you have had more than one UTI, a cystoscopy or imaging studies may be done to determine the cause of the infections. How is this treated? Treatment for this condition includes:  Antibiotic medicine.  Over-the-counter medicines to treat discomfort.  Drinking enough water to stay hydrated. If you have frequent infections or have other conditions such as a kidney stone, you may  need to see a health care provider who specializes in the urinary tract (urologist). In rare cases, urinary tract infections can cause sepsis. Sepsis is a life-threatening condition that occurs when the body responds to an infection. Sepsis is treated in the hospital with IV antibiotics, fluids, and other medicines. Follow these instructions at home:  Medicines  Take over-the-counter and prescription medicines only as told by your health care provider.  If you were prescribed an antibiotic medicine, take it as told by your health care provider. Do not stop using the antibiotic even if you start to feel better. General instructions  Make sure you: ? Empty your bladder often and completely. Do not hold urine for long periods of time. ? Empty your bladder after sex. ? Wipe from front to back after a bowel movement if you are female. Use each tissue one time when you wipe.  Drink enough fluid to keep your urine pale yellow.  Keep all follow-up visits as told by your health care provider. This is important. Contact a health care provider if:  Your symptoms do not get better after 1-2 days.  Your symptoms go away and then return. Get help right away if you have:  Severe pain in your back or your lower abdomen.  A fever.  Nausea or vomiting. Summary  A urinary tract infection (UTI) is an infection of any part of the urinary tract, which includes the kidneys, ureters, bladder, and urethra.  Most urinary tract infections are caused by bacteria in your genital area, around the entrance to your urinary tract (urethra).  Treatment for this condition often includes antibiotic medicines.  If you were prescribed an antibiotic medicine, take it as told by your health care provider. Do not stop using the antibiotic even if you start to feel better.  Keep all follow-up visits as told by your health care provider. This is important. This information is not intended to replace advice given to  you by your health care provider. Make sure you discuss any questions you have with your health care provider. Document Released: 06/05/2005 Document Revised: 03/05/2018 Document Reviewed: 03/05/2018 Elsevier Interactive Patient Education  2019 [employer].

## 2018-09-18 NOTE — Progress Notes (Signed)
Pre visit review using our clinic review tool, if applicable. No additional management support is needed unless otherwise documented below in the visit note. 

## 2018-09-18 NOTE — Progress Notes (Signed)
Chief Complaint  Patient presents with  . Urinary Tract Infection   F/u  1. C/o pressure x a few days and she in an/out caths herself for overactive bladder urethral stricture. She did f/u with Dr. Jacqlyn Larsen who is no longer at the practice and has UTIS frequently. Denies CVA ttp. She tried Augmentin last visit w/o help 07/2018. She has had Ecoli and proteus UTI in the past. She tried pyridium otc w/o relief 2. HTN controlled having eye surgery [place] eye Dr. Celene Squibb 09/28/18 on norvasc 5 mg qd. Given lisinopril in the past caused cough   Review of Systems  Constitutional: Negative for fever and weight loss.  HENT: Negative for hearing loss.   Eyes: Negative for blurred vision.  Respiratory: Negative for shortness of breath.   Cardiovascular: Negative for chest pain.  Genitourinary:       +ab pressure  +dysuria    Skin: Negative for rash.   Past Medical History:  Diagnosis Date  . Arthritis    HANDS/NECK/SHOULDERS  . Asthma    SEASONAL  . Diarrhea    INTERMITTENT CHRONIC  . Fatigue   . GERD (gastroesophageal reflux disease)   . H/O motion sickness 06/17/2016  . Headache    SINUS/ ALLERGIES  . HLD (hyperlipidemia)    borderline  . HTN (hypertension)    CONTROLLED ON MEDS  . Motion sickness   . Seasonal allergies   . Shortness of breath dyspnea    ON EXERTION   Past Surgical History:  Procedure Laterality Date  . BREAST BIOPSY Bilateral    NEG  . BREAST EXCISIONAL BIOPSY Bilateral    NEG  . CARPAL TUNNEL RELEASE     right wrist  . CHOLECYSTECTOMY    . COLONOSCOPY N/A 01/31/2016   Procedure: COLONOSCOPY;  Surgeon: Hulen Luster, MD;  Location: [facility];  Service: Gastroenterology;  Laterality: N/A;  . EYE SURGERY     muscular as child/ CATARACT BILATERAL  . SKIN CANCER EXCISION Right Wrist and thigh (x2)   Unsure of dates and Dermatologist  . VESICOVAGINAL FISTULA CLOSURE W/ TAH     Family History  Problem Relation Age of Onset  . Ovarian cancer Mother   .  Prostate cancer Father   . Coronary artery disease Father   . Diabetes Father   . Heart attack Father   . Heart attack Brother   . Breast cancer Neg Hx    Social History   Socioeconomic History  . Marital status: Married    Spouse name: Not on file  . Number of children: Not on file  . Years of education: Not on file  . Highest education level: Not on file  Occupational History  . Not on file  Social Needs  . Financial resource strain: Not on file  . Food insecurity:    Worry: Not on file    Inability: Not on file  . Transportation needs:    Medical: Not on file    Non-medical: Not on file  Tobacco Use  . Smoking status: Never Smoker  . Smokeless tobacco: Never Used  . Tobacco comment: 1 cig every 3 months   Substance and Sexual Activity  . Alcohol use: Yes    Comment: 1 MIXED DRINK/WEEK  . Drug use: No  . Sexual activity: Not on file  Lifestyle  . Physical activity:    Days per week: Not on file    Minutes per session: Not on file  . Stress: Not on file  Relationships  . Social connections:    Talks on phone: Not on file    Gets together: Not on file    Attends religious service: Not on file    Active member of club or organization: Not on file    Attends meetings of clubs or organizations: Not on file    Relationship status: Not on file  . Intimate partner violence:    Fear of current or ex partner: Not on file    Emotionally abused: Not on file    Physically abused: Not on file    Forced sexual activity: Not on file  Other Topics Concern  . 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    Current Meds  Medication Sig  . amLODipine (NORVASC) 5 MG tablet Take 1 tablet (5 mg total) by mouth daily.  Marland Kitchen aspirin (ASPIR-LOW) 81 MG EC tablet Take 81 mg by mouth daily.   . baclofen (LIORESAL) 10 MG tablet Take 1 tablet (10 mg total) by mouth at bedtime.  . diphenhydrAMINE (BENADRYL) 25 MG tablet Take 25 mg by mouth  daily as needed for itching, allergies or sleep.  . DULoxetine (CYMBALTA) 30 MG capsule Take 1 capsule (30 mg total) by mouth daily.  Marland Kitchen erythromycin ophthalmic ointment   . estrogens, conjugated, (PREMARIN) 0.625 MG tablet Take 0.625 mg by mouth daily. Take daily for 21 days then do not take for 7 days.  . Multiple Vitamins-Minerals (MULTIVITAMIN WITH MINERALS) tablet Take 1 tablet by mouth daily.   . Omega-3 Fatty Acids (OMEGA-3 FISH OIL PO) Take 1 capsule by mouth daily.   Marland Kitchen omeprazole (PRILOSEC) 20 MG capsule Take 1 capsule (20 mg total) by mouth daily as needed. For acid reflux  . oxybutynin (DITROPAN) 5 MG tablet Take 2 tablets (10 mg total) by mouth daily.  . rosuvastatin (CRESTOR) 20 MG tablet TAKE 1 TABLET (20 MG TOTAL) BY MOUTH DAILY.  . traZODone (DESYREL) 150 MG tablet Take 1 tablet (150 mg total) by mouth at bedtime.  . triamcinolone cream (KENALOG) 0.1 % APPLY TWICE DAILY TO AFFECTED RASH AS NEEDED   Allergies  Allergen Reactions  . Lisinopril     Cough   . Shellfish Allergy   . Sulfa Antibiotics     hives  . Sulfonamide Derivatives    Recent Results (from the past 2160 hour(s))  Comp Met (CMET)     Status: Abnormal   Collection Time: 07/28/18 11:52 AM  Result Value Ref Range   Sodium 141 135 - 145 mEq/L   Potassium 4.2 3.5 - 5.1 mEq/L   Chloride 105 96 - 112 mEq/L   CO2 29 19 - 32 mEq/L   Glucose, Bld 106 (H) 70 - 99 mg/dL   BUN 14 6 - 23 mg/dL   Creatinine, Ser 0.80 0.40 - 1.20 mg/dL   Total Bilirubin 0.5 0.2 - 1.2 mg/dL   Alkaline Phosphatase 72 39 - 117 U/L   AST 18 0 - 37 U/L   ALT 17 0 - 35 U/L   Total Protein 7.3 6.0 - 8.3 g/dL   Albumin 4.4 3.5 - 5.2 g/dL   Calcium 9.5 8.4 - 10.5 mg/dL   GFR 74.66 >60.00 mL/min  CBC     Status: None   Collection Time: 07/28/18 11:52 AM  Result Value Ref Range   WBC 6.8 4.0 - 10.5 K/uL   RBC 4.69 3.87 - 5.11 Mil/uL   Platelets 232.0 150.0 - 400.0 K/uL  Hemoglobin 14.5 12.0 - 15.0 g/dL   HCT 42.9 36.0 - 46.0 %    MCV 91.6 78.0 - 100.0 fl   MCHC 33.8 30.0 - 36.0 g/dL   RDW 13.5 11.5 - 15.5 %  TSH     Status: None   Collection Time: 07/28/18 11:52 AM  Result Value Ref Range   TSH 0.99 0.35 - 4.50 uIU/mL   Objective  Body mass index is 29.16 kg/m. Wt Readings from Last 3 Encounters:  09/18/18 186 lb 3.2 oz (84.5 kg)  07/30/18 187 lb (84.8 kg)  07/28/18 188 lb 12.8 oz (85.6 kg)   Temp Readings from Last 3 Encounters:  09/18/18 97.9 F (36.6 C) (Oral)  07/28/18 98 F (36.7 C) (Oral)  04/17/18 98.3 F (36.8 C) (Oral)   BP Readings from Last 3 Encounters:  09/18/18 (!) 152/68  07/30/18 (!) 146/80  07/28/18 140/80   Pulse Readings from Last 3 Encounters:  09/18/18 87  07/30/18 78  07/28/18 77    Physical Exam Vitals signs and nursing note reviewed.  Constitutional:      Appearance: Normal appearance. She is well-developed.  HENT:     Head: Normocephalic and atraumatic.     Nose: Nose normal.     Mouth/Throat:     Mouth: Mucous membranes are moist.     Pharynx: Oropharynx is clear.  Eyes:     Conjunctiva/sclera: Conjunctivae normal.     Pupils: Pupils are equal, round, and reactive to light.  Cardiovascular:     Rate and Rhythm: Normal rate and regular rhythm.     Heart sounds: Normal heart sounds.  Pulmonary:     Effort: Pulmonary effort is normal.     Breath sounds: Normal breath sounds.  Abdominal:     Tenderness: There is abdominal tenderness in the suprapubic area. There is no right CVA tenderness or left CVA tenderness.  Skin:    General: Skin is warm and dry.  Neurological:     General: No focal deficit present.     Mental Status: She is alert and oriented to person, place, and time.     Gait: Gait normal.  Psychiatric:        Attention and Perception: Attention and perception normal.        Mood and Affect: Mood and affect normal.        Speech: Speech normal.        Behavior: Behavior normal. Behavior is cooperative.        Thought Content: Thought content  normal.        Cognition and Memory: Cognition and memory normal.        Judgment: Judgment normal.     Assessment   1. Recurrent UTI with self cathing q3-4 days, h/o urethral stricture and overactive bladder  2. HTN uncontrolled  Plan   1. UA and culture today  Trial of cipro  pending culture may have to change therapy  Refer urology  2. norvasc 5 mg qd add losartan 25 mg qd titrate up if can tolerate in future   Eye surgery [facility] 09/28/18 Dr. Velva Harman eye  Provider: Dr. Olivia Mackie McLean-Scocuzza-[facility]

## 2018-09-19 LAB — URINALYSIS, ROUTINE W REFLEX MICROSCOPIC
BACTERIA UA: NONE SEEN /HPF
BILIRUBIN URINE: NEGATIVE
GLUCOSE, UA: NEGATIVE
Hgb urine dipstick: NEGATIVE
Hyaline Cast: NONE SEEN /LPF
KETONES UR: NEGATIVE
LEUKOCYTES UA: NEGATIVE
NITRITE: NEGATIVE
RBC / HPF: NONE SEEN /HPF (ref 0–2)
SQUAMOUS EPITHELIAL / LPF: NONE SEEN /HPF (ref <=5)
Specific Gravity, Urine: 1.015 (ref 1.001–1.03)
pH: <=5 (ref 5.0–8.0)

## 2018-09-19 LAB — URINE CULTURE
MICRO NUMBER: 39243
RESULT: NO GROWTH
SPECIMEN QUALITY: ADEQUATE

## 2018-09-28 DIAGNOSIS — H1789 Other corneal scars and opacities: Secondary | ICD-10-CM | POA: Diagnosis not present

## 2018-10-05 ENCOUNTER — Ambulatory Visit: Payer: Medicare HMO | Admitting: Family Medicine

## 2018-10-20 ENCOUNTER — Ambulatory Visit: Payer: Self-pay | Admitting: Urology

## 2018-10-21 ENCOUNTER — Ambulatory Visit (INDEPENDENT_AMBULATORY_CARE_PROVIDER_SITE_OTHER): Payer: Medicare HMO

## 2018-10-21 VITALS — BP 126/64 | HR 74 | Temp 98.1°F | Resp 16 | Ht 67.0 in | Wt 189.1 lb

## 2018-10-21 DIAGNOSIS — Z0000 Encounter for general adult medical examination without abnormal findings: Secondary | ICD-10-CM | POA: Diagnosis not present

## 2018-10-21 NOTE — Patient Instructions (Addendum)
  Monica Larson , Thank you for taking time to come for your Medicare Wellness Visit. I appreciate your ongoing commitment to your health goals. Please review the following plan we discussed and let me know if I can assist you in the future.   Follow up as needed.    Bring a copy of your [place] and/or Living Will to be scanned into chart.  Have a great day!  These are the goals we discussed: Goals      Patient Stated   . Increase physical activity (pt-stated)     Attend the gym 2 days a week, walking and/or using treadmill  Lose weight Try not to eat at night       This is a list of the screening recommended for you and due dates:  Health Maintenance  Topic Date Due  . Tetanus Vaccine  03/23/1965  . Mammogram  08/25/2020  . Colon Cancer Screening  01/30/2026  . Flu Shot  Completed  . DEXA scan (bone density measurement)  Completed  .  Hepatitis C: One time screening is recommended by Center for Disease Control  (CDC) for  adults born from [birth year] through 1966.   Completed  . Pneumonia vaccines  Completed

## 2018-10-21 NOTE — Progress Notes (Signed)
Subjective:   Monica Larson is a 73 y.o. female who presents for Medicare Annual (Subsequent) preventive examination.  Review of Systems:  No ROS.  Medicare Wellness Visit. Additional risk factors are reflected in the social history. Cardiac Risk Factors include: advanced age (>62men, >45 women);hypertension     Objective:     Vitals: BP 126/64 (BP Location: Left Arm, Patient Position: Sitting, Cuff Size: Normal)   Pulse 74   Temp 98.1 F (36.7 C) (Oral)   Resp 16   Ht 5\' 7"  (1.702 m)   Wt 189 lb 1.9 oz (85.8 kg)   SpO2 96%   BMI 29.62 kg/m   Body mass index is 29.62 kg/m.  Advanced Directives 10/21/2018 12/10/2017 10/17/2016 05/10/2016 01/31/2016  Does Patient Have a Medical Advance Directive? Yes Yes Yes No Yes  Type of Advance Directive - [place];Living will [place];Living will - -  Does patient want to make changes to medical advance directive? No - Patient declined No - Patient declined No - Patient declined - -  Copy of [place] in Chart? - No - copy requested No - copy requested - -    Tobacco Social History   Tobacco Use  Smoking Status Never Smoker  Smokeless Tobacco Never Used  Tobacco Comment   1 cig every 3 months      Counseling given: Not Answered Comment: 1 cig every 3 months    Clinical Intake:  Pre-visit preparation completed: Yes        Diabetes: No  How often do you need to have someone help you when you read instructions, pamphlets, or other written materials from your doctor or pharmacy?: 1 - Never  Interpreter Needed?: No     Past Medical History:  Diagnosis Date  . Arthritis    HANDS/NECK/SHOULDERS  . Asthma    SEASONAL  . Diarrhea    INTERMITTENT CHRONIC  . Fatigue   . GERD (gastroesophageal reflux disease)   . H/O motion sickness 06/17/2016  . Headache    SINUS/ ALLERGIES  . HLD (hyperlipidemia)    borderline  . HTN (hypertension)    CONTROLLED ON MEDS  . Motion  sickness   . Seasonal allergies   . Shortness of breath dyspnea    ON EXERTION   Past Surgical History:  Procedure Laterality Date  . BREAST BIOPSY Bilateral    NEG  . BREAST EXCISIONAL BIOPSY Bilateral    NEG  . CARPAL TUNNEL RELEASE     right wrist  . CHOLECYSTECTOMY    . COLONOSCOPY N/A 01/31/2016   Procedure: COLONOSCOPY;  Surgeon: Hulen Luster, MD;  Location: [facility];  Service: Gastroenterology;  Laterality: N/A;  . EYE SURGERY     muscular as child/ CATARACT BILATERAL  . SKIN CANCER EXCISION Right Wrist and thigh (x2)   Unsure of dates and Dermatologist  . VESICOVAGINAL FISTULA CLOSURE W/ TAH     Family History  Problem Relation Age of Onset  . Ovarian cancer Mother   . Prostate cancer Father   . Coronary artery disease Father   . Diabetes Father   . Heart attack Father   . Heart attack Brother   . Breast cancer Neg Hx    Social History   Socioeconomic History  . Marital status: Married    Spouse name: Not on file  . Number of children: Not on file  . Years of education: Not on file  . Highest education  level: Not on file  Occupational History  . Not on file  Social Needs  . Financial resource strain: Not hard at all  . Food insecurity:    Worry: Never true    Inability: Never true  . Transportation needs:    Medical: No    Non-medical: No  Tobacco Use  . Smoking status: Never Smoker  . Smokeless tobacco: Never Used  . Tobacco comment: 1 cig every 3 months   Substance and Sexual Activity  . Alcohol use: Yes    Comment: 1 MIXED DRINK/WEEK  . Drug use: No  . Sexual activity: Not on file  Lifestyle  . Physical activity:    Days per week: Not on file    Minutes per session: Not on file  . Stress: Not at all  Relationships  . Social connections:    Talks on phone: Not on file    Gets together: Not on file    Attends religious service: Not on file    Active member of club or organization: Not on file    Attends meetings of clubs or  organizations: Not on file    Relationship status: Not on file  Other Topics Concern  . 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     Outpatient Encounter Medications as of 10/21/2018  Medication Sig  . amLODipine (NORVASC) 5 MG tablet Take 1 tablet (5 mg total) by mouth daily.  Marland Kitchen aspirin (ASPIR-LOW) 81 MG EC tablet Take 81 mg by mouth daily.   . baclofen (LIORESAL) 10 MG tablet Take 1 tablet (10 mg total) by mouth at bedtime.  . diphenhydrAMINE (BENADRYL) 25 MG tablet Take 25 mg by mouth daily as needed for itching, allergies or sleep.  . DULoxetine (CYMBALTA) 30 MG capsule Take 1 capsule (30 mg total) by mouth daily.  Marland Kitchen erythromycin ophthalmic ointment   . estrogens, conjugated, (PREMARIN) 0.625 MG tablet Take 0.625 mg by mouth daily. Take daily for 21 days then do not take for 7 days.  Marland Kitchen gabapentin (NEURONTIN) 100 MG capsule Take 100 mg by mouth daily as needed.  . gabapentin (NEURONTIN) 300 MG capsule Take 300 mg by mouth at bedtime.  Marland Kitchen losartan (COZAAR) 25 MG tablet Take 1 tablet (25 mg total) by mouth daily. In am  . Multiple Vitamins-Minerals (MULTIVITAMIN WITH MINERALS) tablet Take 1 tablet by mouth daily.   . Omega-3 Fatty Acids (OMEGA-3 FISH OIL PO) Take 1 capsule by mouth daily.   Marland Kitchen omeprazole (PRILOSEC) 20 MG capsule Take 1 capsule (20 mg total) by mouth daily as needed. For acid reflux  . oxybutynin (DITROPAN) 5 MG tablet Take 2 tablets (10 mg total) by mouth daily.  . rosuvastatin (CRESTOR) 20 MG tablet TAKE 1 TABLET (20 MG TOTAL) BY MOUTH DAILY.  . traZODone (DESYREL) 150 MG tablet Take 1 tablet (150 mg total) by mouth at bedtime.  . [DISCONTINUED] triamcinolone cream (KENALOG) 0.1 % APPLY TWICE DAILY TO AFFECTED RASH AS NEEDED  . [DISCONTINUED] ciprofloxacin (CIPRO) 500 MG tablet Take 1 tablet (500 mg total) by mouth 2 (two) times daily. With food   No facility-administered encounter medications on file as of 10/21/2018.      Activities of Daily Living In your present state of health, do you have any difficulty performing the following activities: 10/21/2018  Hearing? N  Vision? N  Difficulty concentrating or making decisions? N  Walking or climbing stairs? N  Dressing or bathing? N  Doing errands, shopping? N  Preparing Food and eating ? N  Using the Toilet? N  In the past six months, have you accidently leaked urine? Y  Comment Followed by Urology. Managed with daily brief.   Do you have problems with loss of bowel control? N  Managing your Medications? N  Managing your Finances? N  Housekeeping or managing your Housekeeping? N  Some recent data might be hidden    Patient Care Team: Leone Haven, MD as PCP - General (Family Medicine)    Assessment:   This is a routine wellness examination for [place].  Keep all routine maintenance appointments.   Health Screenings  Colonoscopy-01/31/16 Mammogram-12/17-19 Bone Density -05/18/15 Glaucoma -none Hearing-demonstrates normal hearing during conversation Hemoglobin A1C -04/02/18 (5.8) Cholesterol -10/15/17 (213) Dental- every 6-9 months Vision- 10/13/17  Social  Alcohol intake -yes, 1 per week Smoking history- never Smokers in home? Yes, grandson Illicit drug use? none Exercise -walking Diet -regular  Safety  Patient feels safe at home. Patient does have smoke detectors at home  Patient does wear sunscreen or protective clothing when in direct sunlight  Patient does wear seat belt when driving or riding with others.   Activities of Daily Living Patient can do their own household chores. Denies needing assistance with: driving, feeding themselves, getting from bed to chair, getting to the toilet, bathing/showering, dressing, managing money, climbing flight of stairs, or preparing meals.   Depression Screen Patient denies losing interest in daily life, feeling hopeless, or crying easily over simple problems.   Fall Screen Patient denies  being afraid of falling or falling in the last year.   Memory Screen Husband balances checkbook/bank accounts.  Patient is alert, normal appearance, oriented to person/place/and time. Correctly identified the president of the [place], recall of 3/3 objects, and performing simple calculations.  Patient displays appropriate judgement and can read correct time from watch face.   Immunizations The following Immunizations are up to date: Influenza, shingles, pneumonia, and tetanus.   Other Providers Patient Care Team: Leone Haven, MD as PCP - General (Family Medicine)  Exercise Activities and Dietary recommendations Current Exercise Habits: The patient does not participate in regular exercise at present  Goals      Patient Stated   . Increase physical activity (pt-stated)     Attend the gym 2 days a week, walking and/or using treadmill  Lose weight Try not to eat at night       Fall Risk Fall Risk  10/21/2018 04/17/2018 02/27/2018 10/17/2016 02/12/2016  Falls in the past year? 0 No No Yes Yes  Number falls in past yr: - - - 1 2 or more  Injury with Fall? - - - No No  Risk Factor Category  - - - - High Fall Risk  Risk for fall due to : - - - History of fall(s) -  Follow up - - - Falls prevention discussed -   Depression Screen PHQ 2/9 Scores 10/21/2018 04/17/2018 10/17/2016 02/12/2016  PHQ - 2 Score 0 0 0 0     Cognitive Function     6CIT Screen 10/21/2018 10/17/2016  What Year? 0 points 0 points  What month? 0 points 0 points  What time? 0 points 0 points  Count back from 20 0 points 0 points  Months in reverse 4 points 0 points  Repeat phrase 0 points 0 points  Total Score 4 0    Immunization History  Administered Date(s) Administered  . Influenza Split 06/03/2011  .  Influenza, High Dose Seasonal PF 06/11/2016, 04/30/2017, 06/18/2018  . Influenza-Unspecified 06/19/2013, 07/06/2014  . Pneumococcal Conjugate-13 11/07/2014  . Pneumococcal Polysaccharide-23 07/20/2012  .  Zoster 04/09/2013  . Zoster Recombinat (Shingrix) 02/27/2018, 06/18/2018   Screening Tests Health Maintenance  Topic Date Due  . TETANUS/TDAP  03[birth year]23/1964  . MAMMOGRAM  08/25/2020  . COLONOSCOPY  01/30/2026  . INFLUENZA VACCINE  Completed  . DEXA SCAN  Completed  . Hepatitis C Screening  Completed  . PNA vac Low Risk Adult  Completed      Plan:    End of life planning; Advance aging; Advanced directives discussed. Copy of current HCPOA/Living Will requested.    I have personally reviewed and noted the following in the patient's chart:   . Medical and social history . Use of alcohol, tobacco or illicit drugs  . Current medications and supplements . Functional ability and status . Nutritional status . Physical activity . Advanced directives . List of other physicians . Hospitalizations, surgeries, and ER visits in previous 12 months . Vitals . Screenings to include cognitive, depression, and falls . Referrals and appointments  In addition, I have reviewed and discussed with patient certain preventive protocols, quality metrics, and best practice recommendations. A written personalized care plan for preventive services as well as general preventive health recommendations were provided to patient.     Varney Biles, LPN  [id number]

## 2018-10-22 NOTE — Progress Notes (Signed)
I have reviewed the above note and agree.  Lakendrick Paradis, M.D.  

## 2018-11-05 ENCOUNTER — Ambulatory Visit: Payer: Self-pay | Admitting: Urology

## 2018-11-13 ENCOUNTER — Encounter: Payer: Self-pay | Admitting: Urology

## 2018-11-13 ENCOUNTER — Other Ambulatory Visit: Payer: Self-pay

## 2018-11-13 ENCOUNTER — Ambulatory Visit: Payer: Medicare HMO | Admitting: Urology

## 2018-11-13 ENCOUNTER — Other Ambulatory Visit: Payer: Self-pay | Admitting: Family Medicine

## 2018-11-13 VITALS — BP 145/69 | HR 70 | Ht 67.0 in | Wt 186.0 lb

## 2018-11-13 DIAGNOSIS — N3941 Urge incontinence: Secondary | ICD-10-CM | POA: Diagnosis not present

## 2018-11-13 DIAGNOSIS — N393 Stress incontinence (female) (male): Secondary | ICD-10-CM

## 2018-11-13 DIAGNOSIS — N390 Urinary tract infection, site not specified: Secondary | ICD-10-CM

## 2018-11-13 DIAGNOSIS — R319 Hematuria, unspecified: Secondary | ICD-10-CM

## 2018-11-13 DIAGNOSIS — N3582 Other urethral stricture, female: Secondary | ICD-10-CM

## 2018-11-13 DIAGNOSIS — R339 Retention of urine, unspecified: Secondary | ICD-10-CM

## 2018-11-13 LAB — BLADDER SCAN AMB NON-IMAGING

## 2018-11-13 MED ORDER — OXYBUTYNIN CHLORIDE 5 MG PO TABS: 5.0000 mg | ORAL_TABLET | Freq: Three times a day (TID) | ORAL | 3 refills | Status: DC

## 2018-11-13 NOTE — Patient Instructions (Addendum)
We had a long discussion today about multiple things:  1.  Urethral stricture-I like you to decrease the frequency of self cathing since you are cathing for very low volumes.  With heavy cath once every other week and see how that goes.  He had difficulty introducing the catheter, you may need to increase the frequency.  Please see me a MyChart message if this is the case.  2.  Urinary frequency/urgency- we discussed that your oxybutynin is immediate release and by taking 2 tablets in the morning, you actually only being covered by this medication for about 8 hours.  Have increased her dose to 3 times a day and you can titrate this medicine based on your needs and symptoms.  You may take up to 3 tablets a day but no more.  3.  Recurrent UTIs- at this point in time, there is no documentation that you in fact are having recurrent urinary tract infections.  When you have signs or symptoms of infections, like you to call our office and try to arrange for urine drop off to make this diagnosis.  In the meantime, like you to stop suppressive antibiotics.  I would like you to continue estrogen cream.  We will work to find you a compounded solution which may be cheaper than from your pharmacy.  4.  Stress urinary incontinence- you have leakage when you left cough sneeze move or bend.  You are not currently doing pelvic floor exercises which was would benefit you.  I have provided handout in your after visit summary to remind you how to do these exercises.  Weight loss can also be beneficial.   Pelvic Floor Muscle Exercises  More commonly called "Kegel" exercises: they can strengthen the muscles that hold urine inside the bladder.  Here's how you do them:   - Imagine that you are trying to control passing gas   - Pull in or tighten your pelvic muscles and hold for a count of 3 (you should feel a lifting sensation in the area around your vagina or pulling in your rectum)   - Repeat 10 to 15 times, at least 3  times a day   - Each time you do these exercises, alternate your position between lying, sitting and standing  Talk to you health care professional to make sure you are doing the exercise the right way.     Lie with hips and knees bent.  On an exhale, squeeze anal sphincter and hold for 3 seconds.  Release and fully relax pelvic floor.  Repeat 3 times.  Perform one set each hour during the day.  Also in this position, lie with hips and knees bent.  Slowly inhale, and then exhale.  On the exhale, contract anal sphincter, pull navel to spine and contract buttocks.  Hold for 5 seconds.  Repeat 5 times.  Perform one set each hour of the day in either lying, seated or standing.  While still laying lie with hips and knees bent. Slowly inhale and then exhale while pulling navel toward spine, holding 5 seconds.  Relax, repeat 5 times.  Perform one set each hour daytime in any position lying seated or standing.  Sitting on an exhale squeeze anal sphincter and hold for 3 seconds.  Release and fully relax pelvic floor.  Repeat 3 times.  Perform one set each hour daytime.  Standing on an exhale squeeze anal sphincter and hold for 3 seconds.  Release and fully relax pelvic floor.  Repeat 3  times.  Perform one set each hour daytime.

## 2018-11-13 NOTE — Progress Notes (Signed)
11/13/2018 4:59 PM   [name] 12/06/1945 [id number]  Referring provider: Leone Haven, MD [address] [place], [place] [address]  Chief Complaint  Patient presents with  . Cystitis    New patient    HPI: 73 year old female referred for further evaluation of ongoing urinary issues.  She was referred by her primary care to establish care.    She was PSA followed by Dr. Jacqlyn Larsen for personal history of urethral stricture.  She was intermittently catheterizing herself.  She caths every 2-3 days when she feels like she is not emptying well.  When she does cath, she is not getting out much urine.  This does tend to relieve her pelvic pressure however.  She also has a personal history of urinary urge incontinence and detrusor overactivity.  She was previously taking Toviaz.  She is currently taking oxybutin 10 mg (non XL) in the AM.  THis works fo the most part her symptoms.  She was previously on imipramine 25 mg and Toviaz.  She is bothered most by persistent SUI.  She considers the surgery of failure.  She was offered a redo surgery by Dr. Lelon Huh however declined.  She has tried physical therapy in the past which helped a little.  She does not do Kegel extercises now but this is also been helpful in the past.  She also notes recent weight gain.  She wears several pads per day for continence with physical activity.  She has a personal history of cystocele status post anterior repair and sling operation for stress urinary incontinence 08/2014 by Ricci Barker.  She has a history of total vaginal hysterectomy 1987.  She does take a low dose of abx daily (macrobid).  She is taking this every other day when when she self caths.  She also reports that she gets infections when she swims in the [facility] or takes a bath.  Her primary care, Dr. Caryl Bis is reluctant to prescribe suppressive antibiotics.  She reports that she has had her numerous UTIs treated by him however over the  past year, I see no documented UTIs.  She reports that one was while traveling out of town in [place].  She is not sure why none of her infections are documented with UA urine cultures as she feels like she does go in and drop off urines with symptoms.  She use premarin cream qod.  She is concerned about the cost of this medication.   PMH: Past Medical History:  Diagnosis Date  . Arthritis    HANDS/NECK/SHOULDERS  . Asthma    SEASONAL  . Diarrhea    INTERMITTENT CHRONIC  . Fatigue   . GERD (gastroesophageal reflux disease)   . H/O motion sickness 06/17/2016  . Headache    SINUS/ ALLERGIES  . HLD (hyperlipidemia)    borderline  . HTN (hypertension)    CONTROLLED ON MEDS  . Motion sickness   . Seasonal allergies   . Shortness of breath dyspnea    ON EXERTION    Surgical History: Past Surgical History:  Procedure Laterality Date  . BREAST BIOPSY Bilateral    NEG  . BREAST EXCISIONAL BIOPSY Bilateral    NEG  . CARPAL TUNNEL RELEASE     right wrist  . CHOLECYSTECTOMY    . COLONOSCOPY N/A 01/31/2016   Procedure: COLONOSCOPY;  Surgeon: Hulen Luster, MD;  Location: [facility];  Service: Gastroenterology;  Laterality: N/A;  . EYE SURGERY     muscular  as child/ CATARACT BILATERAL  . SKIN CANCER EXCISION Right Wrist and thigh (x2)   Unsure of dates and Dermatologist  . VESICOVAGINAL FISTULA CLOSURE W/ TAH      Home Medications:  Allergies as of 11/13/2018      Reactions   Lisinopril    Cough   Shellfish Allergy    Sulfa Antibiotics    hives   Sulfonamide Derivatives       Medication List       Accurate as of November 13, 2018  4:59 PM. Always use your most recent med list.        amLODipine 5 MG tablet Commonly known as:  NORVASC Take 1 tablet (5 mg total) by mouth daily.   Aspir-Low 81 MG EC tablet Generic drug:  aspirin Take 81 mg by mouth daily.   baclofen 10 MG tablet Commonly known as:  LIORESAL Take 1 tablet (10 mg total) by mouth at bedtime.     diphenhydrAMINE 25 MG tablet Commonly known as:  BENADRYL Take 25 mg by mouth daily as needed for itching, allergies or sleep.   DULoxetine 30 MG capsule Commonly known as:  Cymbalta Take 1 capsule (30 mg total) by mouth daily.   erythromycin ophthalmic ointment   estrogens (conjugated) 0.625 MG tablet Commonly known as:  PREMARIN Take 0.625 mg by mouth daily. Take daily for 21 days then do not take for 7 days.   gabapentin 300 MG capsule Commonly known as:  NEURONTIN Take 300 mg by mouth at bedtime.   Neurontin 100 MG capsule Generic drug:  gabapentin Take 100 mg by mouth daily as needed.   losartan 25 MG tablet Commonly known as:  Cozaar Take 1 tablet (25 mg total) by mouth daily. In am   multivitamin with minerals tablet Take 1 tablet by mouth daily.   OMEGA-3 FISH OIL PO Take 1 capsule by mouth daily.   omeprazole 20 MG capsule Commonly known as:  PRILOSEC Take 1 capsule (20 mg total) by mouth daily as needed. For acid reflux   oxybutynin 5 MG tablet Commonly known as:  DITROPAN Take 1 tablet (5 mg total) by mouth 3 (three) times daily. For urgency/ frequency.  You can take two in AM and another in the evening as needed.   rosuvastatin 20 MG tablet Commonly known as:  CRESTOR TAKE 1 TABLET (20 MG TOTAL) BY MOUTH DAILY.   traZODone 150 MG tablet Commonly known as:  DESYREL Take 1 tablet (150 mg total) by mouth at bedtime.       Allergies:  Allergies  Allergen Reactions  . Lisinopril     Cough   . Shellfish Allergy   . Sulfa Antibiotics     hives  . Sulfonamide Derivatives     Family History: Family History  Problem Relation Age of Onset  . Ovarian cancer Mother   . Prostate cancer Father   . Coronary artery disease Father   . Diabetes Father   . Heart attack Father   . Heart attack Brother   . Breast cancer Neg Hx     Social History:  reports that she has never smoked. She has never used smokeless tobacco. She reports current alcohol  use. She reports that she does not use drugs.  ROS: UROLOGY Frequent Urination?: Yes Hard to postpone urination?: No Burning/pain with urination?: No Get up at night to urinate?: No Leakage of urine?: Yes Urine stream starts and stops?: No Trouble starting stream?: No Do you have to  strain to urinate?: No Blood in urine?: No Urinary tract infection?: Yes Sexually transmitted disease?: No Injury to kidneys or bladder?: No Painful intercourse?: No Weak stream?: No Currently pregnant?: No Vaginal bleeding?: No Last menstrual period?: n  Gastrointestinal Nausea?: No Vomiting?: No Indigestion/heartburn?: No Diarrhea?: No Constipation?: No  Constitutional Fever: No Night sweats?: No Weight loss?: No Fatigue?: No  Skin Skin rash/lesions?: No Itching?: No  Eyes Blurred vision?: No Double vision?: No  Ears/Nose/Throat Sore throat?: No Sinus problems?: Yes  Hematologic/Lymphatic Swollen glands?: No Easy bruising?: No  Cardiovascular Leg swelling?: No Chest pain?: No  Respiratory Cough?: No Shortness of breath?: No  Endocrine Excessive thirst?: No  Musculoskeletal Back pain?: Yes Joint pain?: Yes  Neurological Headaches?: No Dizziness?: No  Psychologic Depression?: No Anxiety?: No  Physical Exam: BP (!) 145/69   Pulse 70   Ht 5\' 7"  (1.702 m)   Wt 186 lb (84.4 kg)   BMI 29.13 kg/m   Constitutional:  Alert and oriented, No acute distress. HEENT: [place] AT, moist mucus membranes.  Trachea midline, no masses. Cardiovascular: No clubbing, cyanosis, or edema. Respiratory: Normal respiratory effort, no increased work of breathing.. Skin: No rashes, bruises or suspicious lesions. Neurologic: Grossly intact, no focal deficits, moving all 4 extremities. Psychiatric: Normal mood and affect.  Laboratory Data: Lab Results  Component Value Date   WBC 6.8 07/28/2018   HGB 14.5 07/28/2018   HCT 42.9 07/28/2018   MCV 91.6 07/28/2018   PLT 232.0  07/28/2018    Lab Results  Component Value Date   CREATININE 0.80 07/28/2018    Lab Results  Component Value Date   HGBA1C 5.8 04/02/2018    Urinalysis    Component Value Date/Time   COLORURINE DARK YELLOW 09/18/2018 1158   APPEARANCEUR CLEAR 09/18/2018 1158   LABSPEC 1.015 09/18/2018 1158   PHURINE < OR = 5.0 09/18/2018 1158   GLUCOSEU NEGATIVE 09/18/2018 1158   GLUCOSEU NEGATIVE 05/04/2014 1113   HGBUR NEGATIVE 09/18/2018 1158   BILIRUBINUR neg 12/03/2017 1431   KETONESUR NEGATIVE 09/18/2018 1158   PROTEINUR 1+ (A) 09/18/2018 1158   UROBILINOGEN 0.2 12/03/2017 1431   UROBILINOGEN 0.2 05/04/2014 1113   NITRITE NEGATIVE 09/18/2018 1158   LEUKOCYTESUR NEGATIVE 09/18/2018 1158    Lab Results  Component Value Date   MUCUS Presence of (A) 05/04/2014   BACTERIA NONE SEEN 09/18/2018    Pertinent Imaging: PVR 21 cc  Assessment & Plan:    1. Stress incontinence of urine Refractory urge incontinence despite placement of mid urethral sling 2015 We did discuss that redo surgery could be considered but the patient is not interested Encouraged weight loss and resumption of pelvic floor exercises Handout given today - Bladder Scan (Post Void Residual) in office  2. Recurrent UTI Patient reports a history of recurrent UTIs however I see no documentation of this We discussed that she may just have bladder irritation, external irritation or other possible underlying issues rather than true bacterial urinary tract infection I have encouraged her to continue topical estrogen cream and will arrange a compounded version of this to apply to urethra on Monday/Wednesday/Friday for the purpose of UTI prevention I have urged her to discontinue suppressive antibiotics for the time being and will consider resuming this as deemed appropriate based on documented infections I would like her to come to our office specifically for same-day UA/urine culture with signs or symptoms of infection so  that we can ensure that she in fact is having recurrent UTIs  3.  Other stricture of urethra in female We discussed the fact that true female urethral strictures are quite uncommon She believes that she is self cathing to keep her urethra patent I have encouraged her to decrease the frequency of doing this, perhaps every other week and if it becomes difficult to cath on these occasions, we will have to increase the frequency Consider repeat cystoscopy to evaluate her urethra in the future if she continues to have issues with this  4. Urge incontinence Currently managed with oxybutynin 10 mg which she is taking 2 tablets in the morning, non-XL version We discussed that this version of the medication will often wear off after 8 hours and if she is having increasing urgency frequency in the evenings or at nighttime, she may want to spread out this medication or perhaps take 2 tablets in the morning and 1 in the afternoon/evening I refilled this prescription for oxybutynin 5 mg 3 times daily and allow her to titrate as deemed appropriate as this is the less expensive alternative to the XL version  5. Incomplete bladder emptying Patient presumably has a history of incomplete bladder emptying however bladder scan today is minimal and when she self caths, the volumes are also low And this point time, I see no evidence of incomplete bladder emptying but we reevaluate in 3 months after decreasing her frequency of self cath    Return in about 3 months (around 02/13/2019) for PVR,.  Hollice Espy, MD  [facility] [address] [place], [place] [address] [phone]  I spent 60 min with this patient of which greater than 50% was spent in counseling and coordination of care with the patient.   Multiple charts were reviewed including [facility] urology notes, [facility] urogynecology notes, and notes from her primary care physician, Dr. Caryl Bis.

## 2018-11-16 ENCOUNTER — Telehealth: Payer: Self-pay

## 2018-11-16 NOTE — Telephone Encounter (Signed)
Scott w/[facility]   Verbal Order given for a Compounded Estradiol Vaginal Cream  Pea size amount applied to urethra daily at night for 2 weeks and then every other night 3 times weekly  Will ship to patient for free

## 2018-11-16 NOTE — Telephone Encounter (Signed)
Last OV 12/23/2017   Last refilled

## 2018-11-17 ENCOUNTER — Other Ambulatory Visit: Payer: Self-pay

## 2018-11-17 DIAGNOSIS — I10 Essential (primary) hypertension: Secondary | ICD-10-CM

## 2018-11-17 MED ORDER — LOSARTAN POTASSIUM 25 MG PO TABS: 25.0000 mg | ORAL_TABLET | Freq: Every day | ORAL | 1 refills | Status: DC

## 2018-11-20 ENCOUNTER — Other Ambulatory Visit: Payer: Self-pay | Admitting: Family Medicine

## 2019-01-22 ENCOUNTER — Telehealth: Payer: Self-pay

## 2019-01-22 NOTE — Telephone Encounter (Signed)
Called from recall list.  No answer. LMOV.  This is the second attempt per recall list.

## 2019-01-26 NOTE — Telephone Encounter (Signed)
Patient does not feel a need to follow up with cardiology Deleted recall

## 2019-02-03 ENCOUNTER — Ambulatory Visit: Payer: Medicare HMO | Admitting: Family Medicine

## 2019-02-03 DIAGNOSIS — H1789 Other corneal scars and opacities: Secondary | ICD-10-CM | POA: Diagnosis not present

## 2019-02-03 DIAGNOSIS — Z961 Presence of intraocular lens: Secondary | ICD-10-CM | POA: Diagnosis not present

## 2019-02-05 ENCOUNTER — Ambulatory Visit (INDEPENDENT_AMBULATORY_CARE_PROVIDER_SITE_OTHER): Payer: Medicare HMO | Admitting: Family Medicine

## 2019-02-05 ENCOUNTER — Encounter: Payer: Self-pay | Admitting: Family Medicine

## 2019-02-05 ENCOUNTER — Other Ambulatory Visit: Payer: Self-pay

## 2019-02-05 DIAGNOSIS — R600 Localized edema: Secondary | ICD-10-CM

## 2019-02-05 DIAGNOSIS — I10 Essential (primary) hypertension: Secondary | ICD-10-CM

## 2019-02-05 DIAGNOSIS — R7303 Prediabetes: Secondary | ICD-10-CM

## 2019-02-05 DIAGNOSIS — Z87448 Personal history of other diseases of urinary system: Secondary | ICD-10-CM

## 2019-02-05 DIAGNOSIS — E782 Mixed hyperlipidemia: Secondary | ICD-10-CM

## 2019-02-05 DIAGNOSIS — G629 Polyneuropathy, unspecified: Secondary | ICD-10-CM

## 2019-02-05 MED ORDER — AMLODIPINE BESYLATE 5 MG PO TABS: 5.0000 mg | ORAL_TABLET | Freq: Every day | ORAL | 3 refills | Status: DC

## 2019-02-05 NOTE — Assessment & Plan Note (Signed)
Patient with history of this and possibly recurrent UTIs.  She is unsure about following up with her most recent urologist and I offered her a referral to [facility] for a second opinion.  She accepted this and the referral was placed.

## 2019-02-05 NOTE — Assessment & Plan Note (Signed)
Generally well controlled.  She will continue her current regimen.  She will come in for lab work.

## 2019-02-05 NOTE — Assessment & Plan Note (Signed)
Continue Crestor.  She will come in for a lipid panel.

## 2019-02-05 NOTE — Assessment & Plan Note (Signed)
Continues to have issues with this.  I encouraged her to contact her neurologist regarding the gabapentin so that she can see if there is an alternative option.

## 2019-02-05 NOTE — Assessment & Plan Note (Signed)
Suspect this is related to her amlodipine versus venous insufficiency.  She will monitor at this time.

## 2019-02-05 NOTE — Progress Notes (Signed)
Virtual Visit via telephone Note  This visit type was conducted due to national recommendations for restrictions regarding the COVID-19 pandemic (e.g. social distancing).  This format is felt to be most appropriate for this patient at this time.  All issues noted in this document were discussed and addressed.  No physical exam was performed (except for noted visual exam findings with Video Visits).   I connected with Monica Larson today at  4:00 PM EDT by telephone and verified that I am speaking with the correct person using two identifiers. Location patient: home Location provider: work Persons participating in the virtual visit: patient, provider  I discussed the limitations, risks, security and privacy concerns of performing an evaluation and management service by telephone and the availability of in person appointments. I also discussed with the patient that there may be a patient responsible charge related to this service. The patient expressed understanding and agreed to proceed.  Interactive audio and video telecommunications were attempted between this provider and patient, however failed, due to patient having technical difficulties OR patient did not have access to video capability.  We continued and completed visit with audio only.  Reason for visit: follow-up  HPI: Hypertension: Notes this is typically less than 140/90 though she cannot remember specific numbers.  Taking amlodipine and losartan.  No chest pain or shortness of breath.  Notes mild foot and ankle edema when she is on her legs for a long time.  It does go down overnight.  No orthopnea or PND.  Hyperlipidemia: Taking Crestor.  No right upper quadrant pain or myalgias.  Neuropathy: She continues to have issues with this.  They tried her on Cymbalta though that was not beneficial.  She is back on gabapentin and notes that this makes her feel uncoordinated when she takes this.  She has not discussed that with her  neurologist.  History of recurrent UTIs/urethral stricture: Patient did see urology and they recommended that she try Premarin cream.  The patient reports the urologist did not want her to continue on suppressive antibiotics given lack of evidence of recurrent UTIs.  She also reports that they did not want her to continue to self cath.  The patient is unsure about following up with the urologist.   ROS: See pertinent positives and negatives per HPI.  Past Medical History:  Diagnosis Date  . Arthritis    HANDS/NECK/SHOULDERS  . Asthma    SEASONAL  . Diarrhea    INTERMITTENT CHRONIC  . Fatigue   . GERD (gastroesophageal reflux disease)   . H/O motion sickness 06/17/2016  . Headache    SINUS/ ALLERGIES  . HLD (hyperlipidemia)    borderline  . HTN (hypertension)    CONTROLLED ON MEDS  . Motion sickness   . Seasonal allergies   . Shortness of breath dyspnea    ON EXERTION    Past Surgical History:  Procedure Laterality Date  . BREAST BIOPSY Bilateral    NEG  . BREAST EXCISIONAL BIOPSY Bilateral    NEG  . CARPAL TUNNEL RELEASE     right wrist  . CHOLECYSTECTOMY    . COLONOSCOPY N/A 01/31/2016   Procedure: COLONOSCOPY;  Surgeon: Hulen Luster, MD;  Location: [facility];  Service: Gastroenterology;  Laterality: N/A;  . EYE SURGERY     muscular as child/ CATARACT BILATERAL  . SKIN CANCER EXCISION Right Wrist and thigh (x2)   Unsure of dates and Dermatologist  . VESICOVAGINAL FISTULA CLOSURE W/ TAH  Family History  Problem Relation Age of Onset  . Ovarian cancer Mother   . Prostate cancer Father   . Coronary artery disease Father   . Diabetes Father   . Heart attack Father   . Heart attack Brother   . Breast cancer Neg Hx     SOCIAL HX: Non-smoker.   Current Outpatient Medications:  .  amLODipine (NORVASC) 5 MG tablet, Take 1 tablet (5 mg total) by mouth daily., Disp: 90 tablet, Rfl: 3 .  aspirin (ASPIR-LOW) 81 MG EC tablet, Take 81 mg by mouth daily. ,  Disp: , Rfl:  .  diphenhydrAMINE (BENADRYL) 25 MG tablet, Take 25 mg by mouth daily as needed for itching, allergies or sleep., Disp: , Rfl:  .  DULoxetine (CYMBALTA) 30 MG capsule, Take 1 capsule (30 mg total) by mouth daily., Disp: 30 capsule, Rfl: 3 .  gabapentin (NEURONTIN) 100 MG capsule, Take 100 mg by mouth daily as needed., Disp: , Rfl:  .  gabapentin (NEURONTIN) 300 MG capsule, Take 300 mg by mouth at bedtime., Disp: , Rfl:  .  losartan (COZAAR) 25 MG tablet, Take 1 tablet (25 mg total) by mouth daily. In am, Disp: 90 tablet, Rfl: 1 .  Multiple Vitamins-Minerals (MULTIVITAMIN WITH MINERALS) tablet, Take 1 tablet by mouth daily. , Disp: , Rfl:  .  Omega-3 Fatty Acids (OMEGA-3 FISH OIL PO), Take 1 capsule by mouth daily. , Disp: , Rfl:  .  omeprazole (PRILOSEC) 20 MG capsule, Take 1 capsule (20 mg total) by mouth daily as needed. For acid reflux, Disp: 90 capsule, Rfl: 1 .  oxybutynin (DITROPAN) 5 MG tablet, Take 1 tablet (5 mg total) by mouth 3 (three) times daily. For urgency/ frequency.  You can take two in AM and another in the evening as needed., Disp: 180 tablet, Rfl: 3 .  rosuvastatin (CRESTOR) 20 MG tablet, TAKE 1 TABLET (20 MG TOTAL) BY MOUTH DAILY., Disp: 90 tablet, Rfl: 3 .  traZODone (DESYREL) 150 MG tablet, Take 1 tablet (150 mg total) by mouth at bedtime., Disp: 90 tablet, Rfl: 3 .  erythromycin ophthalmic ointment, , Disp: , Rfl:   EXAM: This was a telehealth telephone visit and thus no physical exam was completed.  ASSESSMENT AND PLAN:  Discussed the following assessment and plan:  H/O urethral stricture - Plan: Ambulatory referral to Urology  Essential hypertension  Neuropathy  Bilateral leg edema  Mixed hyperlipidemia - Plan: Lipid panel, Comp Met (CMET)  Prediabetes - Plan: Hemoglobin A1c  Hypertension Generally well controlled.  She will continue her current regimen.  She will come in for lab work.  Neuropathy Continues to have issues with this.  I  encouraged her to contact her neurologist regarding the gabapentin so that she can see if there is an alternative option.  Bilateral leg edema Suspect this is related to her amlodipine versus venous insufficiency.  She will monitor at this time.  Mixed hyperlipidemia Continue Crestor.  She will come in for a lipid panel.  H/O urethral stricture Patient with history of this and possibly recurrent UTIs.  She is unsure about following up with her most recent urologist and I offered her a referral to [facility] for a second opinion.  She accepted this and the referral was placed.  [facility] office staff will contact the patient to schedule labs for 1 month and follow-up in 6 months.   I discussed the assessment and treatment plan with the patient. The patient was provided an opportunity to ask  questions and all were answered. The patient agreed with the plan and demonstrated an understanding of the instructions.   The patient was advised to call back or seek an in-person evaluation if the symptoms worsen or if the condition fails to improve as anticipated.  I provided 22 minutes of non-face-to-face time during this encounter.   [name], MD

## 2019-02-18 ENCOUNTER — Telehealth: Payer: Self-pay

## 2019-02-18 NOTE — Telephone Encounter (Signed)
Copied from [place] [id number]. Topic: General - Other >> Feb 18, 2019  1:30 PM Rainey Pines A wrote: Patient would like to speak with Dr. Tharon Aquas nurse in regards to the gabapentin (NEURONTIN) 300 MG capsule she was prescribed.

## 2019-02-22 NOTE — Telephone Encounter (Signed)
Called and spoke to patient.  Pt said that Dr. Jannifer Franklin prescribed her gabapentin for coordination.  Pt said that Dr. Jannifer Franklin' nurse said that the Rx is not in the record.  Pt went to pharmacy to get a copy of the Rx which shows that Dr. Aletha Halim is the prescribing physician.  Pt said that she will fax rx to Dr. Jannifer Franklin' office.  Pt said that she may want switch doctors and wanted to know what to do.  Advised pt to call office back if she decides that she no longer wants to continue seeing Dr. Jannifer Franklin and a new referral could be placed.

## 2019-02-23 ENCOUNTER — Telehealth: Payer: Self-pay

## 2019-02-23 MED ORDER — DULOXETINE HCL 30 MG PO CPEP: 30.0000 mg | ORAL_CAPSULE | Freq: Every day | ORAL | 3 refills | Status: DC

## 2019-02-23 NOTE — Telephone Encounter (Signed)
I received a fax from the pt stating gabapentin is not working with controlling her neuropathy pain. Pt states she is currently taking 300 mg of gabapentin in the evening and may take 100 mg in the am. Pt states when she takes the 300 mg she staggers and feels drunk.  I reviewed the last o/v from 07/29/19 with the pt and advised during this appt Dr. Jannifer Franklin recommended she stop the gabapentin and start cymbalta 30 mg 1 per day. Pt states she never knew about starting this med.  Pt wanted to know if Dr. Jannifer Franklin would still be in agreement with this plan?

## 2019-02-23 NOTE — Telephone Encounter (Signed)
I called the patient.  She never picked up the Cymbalta prescription, I will send in the prescription again to see if this helps the discomfort.

## 2019-02-23 NOTE — Addendum Note (Signed)
Addended by: Kathrynn Ducking on: 02/23/2019 01:33 PM   Modules accepted: Orders

## 2019-02-25 DIAGNOSIS — R32 Unspecified urinary incontinence: Secondary | ICD-10-CM | POA: Diagnosis not present

## 2019-02-25 DIAGNOSIS — N3592 Unspecified urethral stricture, female: Secondary | ICD-10-CM | POA: Diagnosis not present

## 2019-02-26 ENCOUNTER — Ambulatory Visit: Payer: Medicare HMO | Admitting: Urology

## 2019-04-11 ENCOUNTER — Other Ambulatory Visit: Payer: Self-pay | Admitting: Family Medicine

## 2019-04-11 DIAGNOSIS — I10 Essential (primary) hypertension: Secondary | ICD-10-CM

## 2019-04-11 MED ORDER — LOSARTAN POTASSIUM 25 MG PO TABS: 25.0000 mg | ORAL_TABLET | Freq: Every day | ORAL | 1 refills | Status: DC

## 2019-04-21 ENCOUNTER — Other Ambulatory Visit: Payer: Self-pay | Admitting: Neurology

## 2019-04-22 DIAGNOSIS — N3592 Unspecified urethral stricture, female: Secondary | ICD-10-CM | POA: Diagnosis not present

## 2019-04-22 DIAGNOSIS — N3582 Other urethral stricture, female: Secondary | ICD-10-CM | POA: Diagnosis not present

## 2019-06-23 DIAGNOSIS — Z87448 Personal history of other diseases of urinary system: Secondary | ICD-10-CM | POA: Diagnosis not present

## 2019-06-23 DIAGNOSIS — N3941 Urge incontinence: Secondary | ICD-10-CM | POA: Diagnosis not present

## 2019-08-14 DIAGNOSIS — Z20828 Contact with and (suspected) exposure to other viral communicable diseases: Secondary | ICD-10-CM | POA: Diagnosis not present

## 2019-08-19 ENCOUNTER — Other Ambulatory Visit
Admission: RE | Admit: 2019-08-19 | Discharge: 2019-08-19 | Disposition: A | Discharge status: Home / Self Care | Payer: Medicare HMO | Source: Ambulatory Visit | Attending: Internal Medicine | Admitting: Internal Medicine

## 2019-08-19 ENCOUNTER — Other Ambulatory Visit: Payer: Self-pay

## 2019-08-19 ENCOUNTER — Telehealth: Payer: Self-pay

## 2019-08-19 ENCOUNTER — Ambulatory Visit (INDEPENDENT_AMBULATORY_CARE_PROVIDER_SITE_OTHER): Payer: Medicare HMO | Admitting: Internal Medicine

## 2019-08-19 DIAGNOSIS — N3000 Acute cystitis without hematuria: Secondary | ICD-10-CM

## 2019-08-19 DIAGNOSIS — R300 Dysuria: Secondary | ICD-10-CM | POA: Insufficient documentation

## 2019-08-19 LAB — URINALYSIS, COMPLETE (UACMP) WITH MICROSCOPIC
Bacteria, UA: NONE SEEN
Specific Gravity, Urine: 1.015 (ref 1.005–1.030)
WBC, UA: >50 WBC/hpf — ABNORMAL HIGH (ref 0–5)

## 2019-08-19 MED ORDER — NITROFURANTOIN MONOHYD MACRO 100 MG PO CAPS: 100.0000 mg | ORAL_CAPSULE | Freq: Two times a day (BID) | ORAL | 0 refills | Status: DC

## 2019-08-19 NOTE — Progress Notes (Signed)
Patient ID: Monica Larson, female   DOB: 06/20/1946, 73 y.o.   MRN: [id number]   Virtual Visit via video Note  This visit type was conducted due to national recommendations for restrictions regarding the COVID-19 pandemic (e.g. social distancing).  This format is felt to be most appropriate for this patient at this time.  All issues noted in this document were discussed and addressed.  No physical exam was performed (except for noted visual exam findings with Video Visits).   I connected with Monica Larson by a video enabled telemedicine application and verified that I am speaking with the correct person using two identifiers. Location patient: home Location provider: work Persons participating in the virtual visit: patient, provider  I discussed the limitations, risks, security and privacy concerns of performing an evaluation and management service by video and the availability of in person appointments.  The patient expressed understanding and agreed to proceed.   Reason for visit: work in appt  HPI: She reports symptoms started yesterday.  Noticed some back discomfort.  Then developed dysuria and increased urinary frequency.  Has a history of recurring urinary tract infections.  Feels similar to previous infections. She had previously seen Dr Monica Larson.  He had her on suppressive abx.  Was taking nitrofurantoin 50mg  qod.  Took one of these today.  Tolerates this abx without problems.  No nausea or vomiting.  No abdominal pain.  Eating and drinking.  no diarrhea.  No hematuria.      ROS: See pertinent positives and negatives per HPI.  Past Medical History:  Diagnosis Date  . Arthritis    HANDS/NECK/SHOULDERS  . Asthma    SEASONAL  . Diarrhea    INTERMITTENT CHRONIC  . Fatigue   . GERD (gastroesophageal reflux disease)   . H/O motion sickness 06/17/2016  . Headache    SINUS/ ALLERGIES  . HLD (hyperlipidemia)    borderline  . HTN (hypertension)    CONTROLLED ON MEDS  . Motion sickness   .  Seasonal allergies   . Shortness of breath dyspnea    ON EXERTION    Past Surgical History:  Procedure Laterality Date  . BREAST BIOPSY Bilateral    NEG  . BREAST EXCISIONAL BIOPSY Bilateral    NEG  . CARPAL TUNNEL RELEASE     right wrist  . CHOLECYSTECTOMY    . COLONOSCOPY N/A 01/31/2016   Procedure: COLONOSCOPY;  Surgeon: Hulen Luster, MD;  Location: [facility];  Service: Gastroenterology;  Laterality: N/A;  . EYE SURGERY     muscular as child/ CATARACT BILATERAL  . SKIN CANCER EXCISION Right Wrist and thigh (x2)   Unsure of dates and Dermatologist  . VESICOVAGINAL FISTULA CLOSURE W/ TAH      Family History  Problem Relation Age of Onset  . Ovarian cancer Mother   . Prostate cancer Father   . Coronary artery disease Father   . Diabetes Father   . Heart attack Father   . Heart attack Brother   . Breast cancer Neg Hx     SOCIAL HX: reviewed.    Current Outpatient Medications:  .  amLODipine (NORVASC) 5 MG tablet, Take 1 tablet (5 mg total) by mouth daily., Disp: 90 tablet, Rfl: 3 .  aspirin (ASPIR-LOW) 81 MG EC tablet, Take 81 mg by mouth daily. , Disp: , Rfl:  .  diphenhydrAMINE (BENADRYL) 25 MG tablet, Take 25 mg by mouth daily as needed for itching, allergies or sleep., Disp: , Rfl:  .  DULoxetine (CYMBALTA) 30 MG capsule, Take 1 capsule (30 mg total) by mouth daily., Disp: 30 capsule, Rfl: 3 .  erythromycin ophthalmic ointment, , Disp: , Rfl:  .  gabapentin (NEURONTIN) 100 MG capsule, Take 100 mg by mouth daily as needed., Disp: , Rfl:  .  gabapentin (NEURONTIN) 300 MG capsule, Take 300 mg by mouth at bedtime., Disp: , Rfl:  .  losartan (COZAAR) 25 MG tablet, Take 1 tablet (25 mg total) by mouth daily. In am, Disp: 90 tablet, Rfl: 1 .  Multiple Vitamins-Minerals (MULTIVITAMIN WITH MINERALS) tablet, Take 1 tablet by mouth daily. , Disp: , Rfl:  .  Omega-3 Fatty Acids (OMEGA-3 FISH OIL PO), Take 1 capsule by mouth daily. , Disp: , Rfl:  .  omeprazole (PRILOSEC)  20 MG capsule, Take 1 capsule (20 mg total) by mouth daily as needed. For acid reflux, Disp: 90 capsule, Rfl: 1 .  oxybutynin (DITROPAN) 5 MG tablet, Take 1 tablet (5 mg total) by mouth 3 (three) times daily. For urgency/ frequency.  You can take two in AM and another in the evening as needed., Disp: 180 tablet, Rfl: 3 .  rosuvastatin (CRESTOR) 20 MG tablet, TAKE 1 TABLET (20 MG TOTAL) BY MOUTH DAILY., Disp: 90 tablet, Rfl: 3 .  traZODone (DESYREL) 150 MG tablet, Take 1 tablet (150 mg total) by mouth at bedtime., Disp: 90 tablet, Rfl: 3 .  nitrofurantoin, macrocrystal-monohydrate, (MACROBID) 100 MG capsule, Take 1 capsule (100 mg total) by mouth 2 (two) times daily., Disp: 10 capsule, Rfl: 0  EXAM:  GENERAL: alert, oriented, appears well and in no acute distress  HEENT: atraumatic, conjunttiva clear, no obvious abnormalities on inspection of external nose and ears  NECK: normal movements of the head and neck  LUNGS: on inspection no signs of respiratory distress, breathing rate appears normal, no obvious gross SOB, gasping or wheezing  CV: no obvious cyanosis  PSYCH/NEURO: pleasant and cooperative, no obvious depression or anxiety, speech and thought processing grossly intact  ASSESSMENT AND PLAN:  Discussed the following assessment and plan:  UTI (urinary tract infection) Symptoms c/w UTI.  Urinalysis - color interference.  Micro - >50 wbc's.  Has a history of recurring UTIs.  Took an abx prior to giving urine sample.  Was on nitrofurantoin as a suppressive abx.  Tolerated.  Treat with macrobid.  Stay hydrated.  Await culture results.      I discussed the assessment and treatment plan with the patient. The patient was provided an opportunity to ask questions and all were answered. The patient agreed with the plan and demonstrated an understanding of the instructions.   The patient was advised to call back or seek an in-person evaluation if the symptoms worsen or if the condition fails  to improve as anticipated.   Einar Pheasant, MD

## 2019-08-19 NOTE — Addendum Note (Signed)
Addended by: Santiago Bur on: 08/19/2019 12:01 PM   Modules accepted: Orders

## 2019-08-19 NOTE — Telephone Encounter (Signed)
Pt was tested for COVID on Saturday. Cannot come into office to leave urine sample. Pt is going to the out pt lab for a urine sample prior to her virtual appt at 1:30. Future orders placed

## 2019-08-20 LAB — URINE CULTURE: Culture: <10000 — AB

## 2019-08-22 ENCOUNTER — Encounter: Payer: Self-pay | Admitting: Internal Medicine

## 2019-08-22 NOTE — Assessment & Plan Note (Signed)
Symptoms c/w UTI.  Urinalysis - color interference.  Micro - >50 wbc's.  Has a history of recurring UTIs.  Took an abx prior to giving urine sample.  Was on nitrofurantoin as a suppressive abx.  Tolerated.  Treat with macrobid.  Stay hydrated.  Await culture results.

## 2019-10-04 DIAGNOSIS — M25552 Pain in left hip: Secondary | ICD-10-CM | POA: Diagnosis not present

## 2019-10-04 DIAGNOSIS — M25551 Pain in right hip: Secondary | ICD-10-CM | POA: Diagnosis not present

## 2019-10-04 DIAGNOSIS — M7061 Trochanteric bursitis, right hip: Secondary | ICD-10-CM | POA: Diagnosis not present

## 2019-10-04 DIAGNOSIS — M7062 Trochanteric bursitis, left hip: Secondary | ICD-10-CM | POA: Diagnosis not present

## 2019-10-25 ENCOUNTER — Other Ambulatory Visit: Payer: Self-pay

## 2019-10-25 ENCOUNTER — Encounter: Payer: Self-pay | Admitting: Family Medicine

## 2019-10-25 ENCOUNTER — Ambulatory Visit: Payer: Medicare HMO

## 2019-10-25 ENCOUNTER — Ambulatory Visit (INDEPENDENT_AMBULATORY_CARE_PROVIDER_SITE_OTHER): Payer: PPO | Admitting: Family Medicine

## 2019-10-25 VITALS — Ht 67.0 in | Wt 185.0 lb

## 2019-10-25 DIAGNOSIS — N3941 Urge incontinence: Secondary | ICD-10-CM

## 2019-10-25 DIAGNOSIS — I700 Atherosclerosis of aorta: Secondary | ICD-10-CM | POA: Diagnosis not present

## 2019-10-25 DIAGNOSIS — R079 Chest pain, unspecified: Secondary | ICD-10-CM | POA: Diagnosis not present

## 2019-10-25 DIAGNOSIS — I10 Essential (primary) hypertension: Secondary | ICD-10-CM

## 2019-10-25 DIAGNOSIS — E782 Mixed hyperlipidemia: Secondary | ICD-10-CM | POA: Diagnosis not present

## 2019-10-25 DIAGNOSIS — R3129 Other microscopic hematuria: Secondary | ICD-10-CM | POA: Diagnosis not present

## 2019-10-25 DIAGNOSIS — R448 Other symptoms and signs involving general sensations and perceptions: Secondary | ICD-10-CM | POA: Diagnosis not present

## 2019-10-25 DIAGNOSIS — K219 Gastro-esophageal reflux disease without esophagitis: Secondary | ICD-10-CM | POA: Diagnosis not present

## 2019-10-25 MED ORDER — SOLIFENACIN SUCCINATE 5 MG PO TABS: 5.0000 mg | ORAL_TABLET | Freq: Every day | ORAL | 1 refills | Status: DC

## 2019-10-25 MED ORDER — OMEPRAZOLE 20 MG PO CPDR: 20.0000 mg | DELAYED_RELEASE_CAPSULE | Freq: Every day | ORAL | 1 refills | Status: DC | PRN

## 2019-10-25 MED ORDER — GABAPENTIN 300 MG PO CAPS: 300.0000 mg | ORAL_CAPSULE | Freq: Every day | ORAL | 1 refills | Status: DC

## 2019-10-25 NOTE — Assessment & Plan Note (Signed)
Undetermined control.  She will check once a day for the next week and send [place] her readings.  She would like to come off medication if possible.

## 2019-10-25 NOTE — Assessment & Plan Note (Signed)
We will trial Crestor 10 mg once daily to see if that makes a difference with her joint aches.  She will let us know in 1 to 2 weeks whether she has had benefit from this.  Consider pravastatin if she continues to have issues.

## 2019-10-25 NOTE — Assessment & Plan Note (Signed)
Only occasional symptoms.  She can continue as needed omeprazole.

## 2019-10-25 NOTE — Assessment & Plan Note (Signed)
Continue risk factor management. 

## 2019-10-25 NOTE — Progress Notes (Signed)
Virtual Visit via video Note  This visit type was conducted due to national recommendations for restrictions regarding the COVID-19 pandemic (e.g. social distancing).  This format is felt to be most appropriate for this patient at this time.  All issues noted in this document were discussed and addressed.  No physical exam was performed (except for noted visual exam findings with Video Visits).   I connected with Monica Larson today at 10:30 AM EST by a video enabled telemedicine application and verified that I am speaking with the correct person using two identifiers. Location patient: home Location provider: work  Persons participating in the virtual visit: patient, provider  I discussed the limitations, risks, security and privacy concerns of performing an evaluation and management service by telephone and the availability of in person appointments. I also discussed with the patient that there may be a patient responsible charge related to this service. The patient expressed understanding and agreed to proceed.  Reason for visit: f/u.  HPI: HYPERTENSION  Disease Monitoring  Home BP Monitoring not checking Chest pain- see below   Dyspnea- see below Medications  Compliance-  Taking amlodipine, losartan  HYPERLIPIDEMIA Symptoms Chest pain on exertion:  See below   Medications: Compliance- taking lipitor Right upper quadrant pain- no  Muscle aches- chronic, some joint aches as well, she wonders if the Lipitor is contributing.  GERD:   Reflux symptoms: 1-2x/week depending on what she eats, takes prilosec prn  Abd pain: no   Blood in stool: no  Dysphagia: chronic from Bells palsy   EGD: no  Medication: prilosec  OAB: taking vesicare 5 mg once daily with good benefit. Improved urgency and incontinence.   Patient feels as though she stays cold all the time.  She notes this has been an ongoing issue.  She notes no history of thyroid dysfunction or anemia.  She wants lab work.  Chest  pain: Patient notes for some time now she has had some mild exertional chest tightness with some dyspnea when she is walking.  Also occurs when she is anxious.  She was previously following with cardiology though she canceled her most recent appointment with them.  ROS: See pertinent positives and negatives per HPI.  Past Medical History:  Diagnosis Date  . Arthritis    HANDS/NECK/SHOULDERS  . Asthma    SEASONAL  . Diarrhea    INTERMITTENT CHRONIC  . Fatigue   . GERD (gastroesophageal reflux disease)   . H/O motion sickness 06/17/2016  . Headache    SINUS/ ALLERGIES  . HLD (hyperlipidemia)    borderline  . HTN (hypertension)    CONTROLLED ON MEDS  . Motion sickness   . Seasonal allergies   . Shortness of breath dyspnea    ON EXERTION    Past Surgical History:  Procedure Laterality Date  . BREAST BIOPSY Bilateral    NEG  . BREAST EXCISIONAL BIOPSY Bilateral    NEG  . CARPAL TUNNEL RELEASE     right wrist  . CHOLECYSTECTOMY    . COLONOSCOPY N/A 01/31/2016   Procedure: COLONOSCOPY;  Surgeon: Hulen Luster, MD;  Location: [facility];  Service: Gastroenterology;  Laterality: N/A;  . EYE SURGERY     muscular as child/ CATARACT BILATERAL  . SKIN CANCER EXCISION Right Wrist and thigh (x2)   Unsure of dates and Dermatologist  . VESICOVAGINAL FISTULA CLOSURE W/ TAH      Family History  Problem Relation Age of Onset  . Ovarian cancer Mother   .  Prostate cancer Father   . Coronary artery disease Father   . Diabetes Father   . Heart attack Father   . Heart attack Brother   . Breast cancer Neg Hx     SOCIAL HX: Non-smoker   Current Outpatient Medications:  .  amLODipine (NORVASC) 5 MG tablet, Take 1 tablet (5 mg total) by mouth daily., Disp: 90 tablet, Rfl: 3 .  aspirin (ASPIR-LOW) 81 MG EC tablet, Take 81 mg by mouth daily. , Disp: , Rfl:  .  diphenhydrAMINE (BENADRYL) 25 MG tablet, Take 25 mg by mouth daily as needed for itching, allergies or sleep., Disp: , Rfl:    .  DULoxetine (CYMBALTA) 30 MG capsule, Take 1 capsule (30 mg total) by mouth daily., Disp: 30 capsule, Rfl: 3 .  erythromycin ophthalmic ointment, , Disp: , Rfl:  .  gabapentin (NEURONTIN) 100 MG capsule, Take 100 mg by mouth daily as needed., Disp: , Rfl:  .  gabapentin (NEURONTIN) 300 MG capsule, Take 1 capsule (300 mg total) by mouth at bedtime., Disp: 90 capsule, Rfl: 1 .  losartan (COZAAR) 25 MG tablet, Take 1 tablet (25 mg total) by mouth daily. In am, Disp: 90 tablet, Rfl: 1 .  Multiple Vitamins-Minerals (MULTIVITAMIN WITH MINERALS) tablet, Take 1 tablet by mouth daily. , Disp: , Rfl:  .  Omega-3 Fatty Acids (OMEGA-3 FISH OIL PO), Take 1 capsule by mouth daily. , Disp: , Rfl:  .  omeprazole (PRILOSEC) 20 MG capsule, Take 1 capsule (20 mg total) by mouth daily as needed. For acid reflux, Disp: 90 capsule, Rfl: 1 .  oxybutynin (DITROPAN) 5 MG tablet, Take 1 tablet (5 mg total) by mouth 3 (three) times daily. For urgency/ frequency.  You can take two in AM and another in the evening as needed., Disp: 180 tablet, Rfl: 3 .  rosuvastatin (CRESTOR) 20 MG tablet, TAKE 1 TABLET (20 MG TOTAL) BY MOUTH DAILY., Disp: 90 tablet, Rfl: 3 .  solifenacin (VESICARE) 5 MG tablet, Take 1 tablet (5 mg total) by mouth daily., Disp: 90 tablet, Rfl: 1 .  traZODone (DESYREL) 150 MG tablet, Take 1 tablet (150 mg total) by mouth at bedtime., Disp: 90 tablet, Rfl: 3  EXAM:  VITALS per patient if applicable:  GENERAL: alert, oriented, appears well and in no acute distress  HEENT: atraumatic, conjunttiva clear, no obvious abnormalities on inspection of external nose and ears  NECK: normal movements of the head and neck  LUNGS: on inspection no signs of respiratory distress, breathing rate appears normal, no obvious gross SOB, gasping or wheezing  CV: no obvious cyanosis  MS: moves all visible extremities without noticeable abnormality  PSYCH/NEURO: pleasant and cooperative, no obvious depression or anxiety,  speech and thought processing grossly intact  ASSESSMENT AND PLAN:  Discussed the following assessment and plan:  Aortic atherosclerosis ([place]) Continue risk factor management.  Hypertension Undetermined control.  She will check once a day for the next week and send [place] her readings.  She would like to come off medication if possible.  Chest pain Having exertional symptoms.  Will refer back to cardiology.  Advised that if she had any persistent symptoms that did not resolve quickly with rest she should seek medical attention in the ED.  Urge incontinence Doing well on Vesicare.  Refill provided.  Mixed hyperlipidemia We will trial Crestor 10 mg once daily to see if that makes a difference with her joint aches.  She will let us know in 1 to 2 weeks  whether she has had benefit from this.  Consider pravastatin if she continues to have issues.  GERD (gastroesophageal reflux disease) Only occasional symptoms.  She can continue as needed omeprazole.  Feels cold Plan for lab work to evaluate for anemia and thyroid dysfunction.  Microscopic hematuria Noted with recent UTI.  We will recheck urine.   Orders Placed This Encounter  Procedures  . TSH    Standing Status:   Future    Standing Expiration Date:   10/24/2020  . Comp Met (CMET)    Standing Status:   Future    Standing Expiration Date:   10/24/2020  . HgB A1c    Standing Status:   Future    Standing Expiration Date:   10/24/2020  . CBC    Standing Status:   Future    Standing Expiration Date:   10/24/2020  . Lipid panel    Standing Status:   Future    Standing Expiration Date:   10/24/2020  . Ambulatory referral to Cardiology    Referral Priority:   Routine    Referral Type:   Consultation    Referral Reason:   Specialty Services Required    Requested Specialty:   Cardiology    Number of Visits Requested:   1  . POCT urinalysis dipstick    Standing Status:   Future    Standing Expiration Date:   12/23/2019    Meds  ordered this encounter  Medications  . gabapentin (NEURONTIN) 300 MG capsule    Sig: Take 1 capsule (300 mg total) by mouth at bedtime.    Dispense:  90 capsule    Refill:  1  . omeprazole (PRILOSEC) 20 MG capsule    Sig: Take 1 capsule (20 mg total) by mouth daily as needed. For acid reflux    Dispense:  90 capsule    Refill:  1  . solifenacin (VESICARE) 5 MG tablet    Sig: Take 1 tablet (5 mg total) by mouth daily.    Dispense:  90 tablet    Refill:  1     I discussed the assessment and treatment plan with the patient. The patient was provided an opportunity to ask questions and all were answered. The patient agreed with the plan and demonstrated an understanding of the instructions.   The patient was advised to call back or seek an in-person evaluation if the symptoms worsen or if the condition fails to improve as anticipated.   [name], MD

## 2019-10-25 NOTE — Assessment & Plan Note (Signed)
Plan for lab work to evaluate for anemia and thyroid dysfunction.

## 2019-10-25 NOTE — Assessment & Plan Note (Signed)
Doing well on Vesicare.  Refill provided.

## 2019-10-25 NOTE — Assessment & Plan Note (Signed)
Having exertional symptoms.  Will refer back to cardiology.  Advised that if she had any persistent symptoms that did not resolve quickly with rest she should seek medical attention in the ED.

## 2019-10-25 NOTE — Assessment & Plan Note (Signed)
Noted with recent UTI.  We will recheck urine.

## 2019-10-26 ENCOUNTER — Ambulatory Visit (INDEPENDENT_AMBULATORY_CARE_PROVIDER_SITE_OTHER): Payer: PPO

## 2019-10-26 ENCOUNTER — Telehealth: Payer: Self-pay | Admitting: Family Medicine

## 2019-10-26 VITALS — Ht 67.0 in | Wt 185.0 lb

## 2019-10-26 DIAGNOSIS — Z0000 Encounter for general adult medical examination without abnormal findings: Secondary | ICD-10-CM

## 2019-10-26 NOTE — Telephone Encounter (Signed)
LVM to set up 1 wk lab and 4 month f/u with PCP

## 2019-10-26 NOTE — Progress Notes (Signed)
Subjective:   Monica Larson is a 74 y.o. female who presents for Medicare Annual (Subsequent) preventive examination.  Review of Systems:  No ROS.  Medicare Wellness Virtual Visit.  Visual/audio telehealth visit, UTA vital signs.   Ht/Wt provided.  See social history for additional risk factors.  Cardiac Risk Factors include: advanced age (>34men, >58 women);hypertension     Objective:     Vitals: There were no vitals taken for this visit.  There is no height or weight on file to calculate BMI.  Advanced Directives 10/26/2019 10/21/2018 12/10/2017 10/17/2016 05/10/2016 01/31/2016  Does Patient Have a Medical Advance Directive? Yes Yes Yes Yes No Yes  Type of [employer] [place];Living will [place];Living will - -  Does patient want to make changes to medical advance directive? No - Patient declined No - Patient declined No - Patient declined No - Patient declined - -  Copy of Price in Chart? No - copy requested - No - copy requested No - copy requested - -    Tobacco Social History   Tobacco Use  Smoking Status Never Smoker  Smokeless Tobacco Never Used  Tobacco Comment   1 cig every 3 months      Counseling given: Not Answered Comment: 1 cig every 3 months    Clinical Intake:  Pre-visit preparation completed: Yes        Diabetes: No  How often do you need to have someone help you when you read instructions, pamphlets, or other written materials from your doctor or pharmacy?: 1 - Never  Interpreter Needed?: No     Past Medical History:  Diagnosis Date  . Arthritis    HANDS/NECK/SHOULDERS  . Asthma    SEASONAL  . Diarrhea    INTERMITTENT CHRONIC  . Fatigue   . GERD (gastroesophageal reflux disease)   . H/O motion sickness 06/17/2016  . Headache    SINUS/ ALLERGIES  . HLD (hyperlipidemia)    borderline  . HTN (hypertension)    CONTROLLED ON MEDS  . Motion  sickness   . Seasonal allergies   . Shortness of breath dyspnea    ON EXERTION   Past Surgical History:  Procedure Laterality Date  . BREAST BIOPSY Bilateral    NEG  . BREAST EXCISIONAL BIOPSY Bilateral    NEG  . CARPAL TUNNEL RELEASE     right wrist  . CHOLECYSTECTOMY    . COLONOSCOPY N/A 01/31/2016   Procedure: COLONOSCOPY;  Surgeon: Hulen Luster, MD;  Location: [facility];  Service: Gastroenterology;  Laterality: N/A;  . EYE SURGERY     muscular as child/ CATARACT BILATERAL  . SKIN CANCER EXCISION Right Wrist and thigh (x2)   Unsure of dates and Dermatologist  . VESICOVAGINAL FISTULA CLOSURE W/ TAH     Family History  Problem Relation Age of Onset  . Ovarian cancer Mother   . Prostate cancer Father   . Coronary artery disease Father   . Diabetes Father   . Heart attack Father   . Heart attack Brother   . Breast cancer Neg Hx    Social History   Socioeconomic History  . Marital status: Married    Spouse name: Not on file  . Number of children: Not on file  . Years of education: Not on file  . Highest education level: Not on file  Occupational History  . Not on file  Tobacco Use  .  Smoking status: Never Smoker  . Smokeless tobacco: Never Used  . Tobacco comment: 1 cig every 3 months   Substance and Sexual Activity  . Alcohol use: Yes    Comment: 1 MIXED DRINK/WEEK  . Drug use: No  . Sexual activity: Not on file  Other Topics Concern  . 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    Social Determinants of Health   Financial Resource Strain: Low Risk   . Difficulty of Paying Living Expenses: Not hard at all  Food Insecurity: No Food Insecurity  . Worried About Charity fundraiser in the Last Year: Never true  . Ran Out of Food in the Last Year: Never true  Transportation Needs: No Transportation Needs  . Lack of Transportation (Medical): No  . Lack of Transportation (Non-Medical): No  Physical  Activity:   . Days of Exercise per Week: Not on file  . Minutes of Exercise per Session: Not on file  Stress: No Stress Concern Present  . Feeling of Stress : Only a little  Social Connections: Unknown  . Frequency of Communication with Friends and Family: More than three times a week  . Frequency of Social Gatherings with Friends and Family: More than three times a week  . Attends Religious Services: Not on file  . Active Member of Clubs or Organizations: Not on file  . Attends Archivist Meetings: Not on file  . Marital Status: Married    Outpatient Encounter Medications as of 10/26/2019  Medication Sig  . amLODipine (NORVASC) 5 MG tablet Take 1 tablet (5 mg total) by mouth daily.  Marland Kitchen aspirin (ASPIR-LOW) 81 MG EC tablet Take 81 mg by mouth daily.   . diphenhydrAMINE (BENADRYL) 25 MG tablet Take 25 mg by mouth daily as needed for itching, allergies or sleep.  . DULoxetine (CYMBALTA) 30 MG capsule Take 1 capsule (30 mg total) by mouth daily.  Marland Kitchen erythromycin ophthalmic ointment   . gabapentin (NEURONTIN) 100 MG capsule Take 100 mg by mouth daily as needed.  . gabapentin (NEURONTIN) 300 MG capsule Take 1 capsule (300 mg total) by mouth at bedtime.  Marland Kitchen losartan (COZAAR) 25 MG tablet Take 1 tablet (25 mg total) by mouth daily. In am  . Multiple Vitamins-Minerals (MULTIVITAMIN WITH MINERALS) tablet Take 1 tablet by mouth daily.   . Omega-3 Fatty Acids (OMEGA-3 FISH OIL PO) Take 1 capsule by mouth daily.   Marland Kitchen omeprazole (PRILOSEC) 20 MG capsule Take 1 capsule (20 mg total) by mouth daily as needed. For acid reflux  . oxybutynin (DITROPAN) 5 MG tablet Take 1 tablet (5 mg total) by mouth 3 (three) times daily. For urgency/ frequency.  You can take two in AM and another in the evening as needed.  . rosuvastatin (CRESTOR) 20 MG tablet TAKE 1 TABLET (20 MG TOTAL) BY MOUTH DAILY.  Marland Kitchen solifenacin (VESICARE) 5 MG tablet Take 1 tablet (5 mg total) by mouth daily.  . traZODone (DESYREL) 150 MG  tablet Take 1 tablet (150 mg total) by mouth at bedtime.   No facility-administered encounter medications on file as of 10/26/2019.    Activities of Daily Living In your present state of health, do you have any difficulty performing the following activities: 10/26/2019  Hearing? N  Vision? N  Difficulty concentrating or making decisions? N  Walking or climbing stairs? N  Dressing or bathing? N  Doing errands, shopping? N  Preparing Food and eating ? N  Using the Toilet? N  In the past six months, have you accidently leaked urine? N  Do you have problems with loss of bowel control? N  Managing your Medications? N  Managing your Finances? N  Housekeeping or managing your Housekeeping? N  Some recent data might be hidden    Patient Care Team: Leone Haven, MD as PCP - General (Family Medicine)    Assessment:   This is a routine wellness examination for [place].  Nurse connected with patient 10/26/19 at 10:30 AM EST by a telephone enabled telemedicine application and verified that I am speaking with the correct person using two identifiers. Patient stated full name and DOB. Patient gave permission to continue with virtual visit. Patient's location was at home and Nurse's location was at [place] office.   Patient is alert and oriented x3. Patient notes a little difficulty focusing or concentrating. Baseline. Patient likes to read for brain stimulation.   Health Maintenance Due: -Tdap vaccine- discussed; to be completed with doctor in visit or local pharmacy.   See completed HM at the end of note.   Eye: Visual acuity not assessed. Virtual visit. Followed by their ophthalmologist.  Dental: Visits every 6 months.    Hearing: Demonstrates normal hearing during visit.  Safety:  Patient feels safe at home- yes Patient does have smoke detectors at home- yes Patient does wear sunscreen or protective clothing when in direct sunlight - yes Patient does wear seat belt when in a  moving vehicle - yes Patient drives- yes Adequate lighting in walkways free from debris- yes Grab bars and handrails used as appropriate- yes Ambulates with an assistive device- no Cell phone on person when ambulating outside of the home- yes  Social: Alcohol intake - yes      Smoking history- never   Smokers in home? none Illicit drug use? none  Medication: Taking as directed and without issues.  Self managed - yes   Covid-19: Precautions and sickness symptoms discussed. Wears mask, social distancing, hand hygiene as appropriate.   Activities of Daily Living Patient denies needing assistance with: household chores, feeding themselves, getting from bed to chair, getting to the toilet, bathing/showering, dressing, managing money, or preparing meals.   Discussed the importance of a healthy diet, water intake and the benefits of aerobic exercise.  Physical activity- active around the home.  Diet:  Regular Water: good intake  Other Providers Patient Care Team: Leone Haven, MD as PCP - General (Family Medicine)  Exercise Activities and Dietary recommendations Current Exercise Habits: Home exercise routine, Intensity: Mild  Goals      Patient Stated   . I want to walk more when the weather is good (pt-stated)       Fall Risk Fall Risk  10/26/2019 10/25/2019 10/21/2018 04/17/2018 02/27/2018  Falls in the past year? (No Data) 1 0 No No  Comment No change in the last 24 hours - - - -  Number falls in past yr: - 0 - - -  Injury with Fall? - 0 - - -  Risk Factor Category  - - - - -  Risk for fall due to : - - - - -  Follow up Falls evaluation completed Falls evaluation completed - - -   Timed Get Up and Go performed: no, virtual visit  Depression Screen PHQ 2/9 Scores 10/26/2019 10/25/2019 10/21/2018 04/17/2018  PHQ - 2 Score 0 0 0 0     Cognitive Function MMSE - Mini Mental State Exam  10/26/2019  Not completed: Refused     6CIT Screen 10/21/2018 10/17/2016  What Year?  0 points 0 points  What month? 0 points 0 points  What time? 0 points 0 points  Count back from 20 0 points 0 points  Months in reverse 4 points 0 points  Repeat phrase 0 points 0 points  Total Score 4 0    Immunization History  Administered Date(s) Administered  . Fluad Quad(high Dose 65+) 06/15/2019  . Influenza Split 06/03/2011  . Influenza, High Dose Seasonal PF 06/11/2016, 04/30/2017, 06/18/2018  . Influenza,inj,quad, With Preservative 06/15/2019  . Influenza-Unspecified 06/19/2013, 07/06/2014  . PFIZER SARS-COV-2 Vaccination 09/16/2019, 10/09/2019  . Pneumococcal Conjugate-13 11/07/2014  . Pneumococcal Polysaccharide-23 07/20/2012  . Zoster 04/09/2013  . Zoster Recombinat (Shingrix) 02/27/2018, 06/18/2018   Screening Tests Health Maintenance  Topic Date Due  . TETANUS/TDAP  03/23/1964  . MAMMOGRAM  08/25/2020  . COLONOSCOPY  01/30/2026  . INFLUENZA VACCINE  Completed  . DEXA SCAN  Completed  . Hepatitis C Screening  Completed  . PNA vac Low Risk Adult  Completed      Plan:   Keep all routine maintenance appointments.   Follow up with lab work. CMA to call and schedule appointment with labs.   Cardiology/stress test appointment scheduled 10/28/19.  Medicare Attestation I have personally reviewed: The patient's medical and social history Their use of alcohol, tobacco or illicit drugs Their current medications and supplements The patient's functional ability including ADLs,fall risks, home safety risks, cognitive, and hearing and visual impairment Diet and physical activities Evidence for depression   I have reviewed and discussed with patient certain preventive protocols, quality metrics, and best practice recommendations.      Varney Biles, LPN  [id number]

## 2019-10-26 NOTE — Patient Instructions (Addendum)
  Monica Larson , Thank you for taking time to come for your Medicare Wellness Visit. I appreciate your ongoing commitment to your health goals. Please review the following plan we discussed and let me know if I can assist you in the future.   These are the goals we discussed: Goals      Patient Stated   . I want to walk more when the weather is good (pt-stated)       This is a list of the screening recommended for you and due dates:  Health Maintenance  Topic Date Due  . Tetanus Vaccine  03/23/1964  . Mammogram  08/25/2020  . Colon Cancer Screening  01/30/2026  . Flu Shot  Completed  . DEXA scan (bone density measurement)  Completed  .  Hepatitis C: One time screening is recommended by Center for Disease Control  (CDC) for  adults born from [birth year] through 1966.   Completed  . Pneumonia vaccines  Completed

## 2019-10-28 ENCOUNTER — Ambulatory Visit: Payer: PPO | Admitting: Cardiology

## 2019-10-29 ENCOUNTER — Telehealth: Payer: Self-pay | Admitting: Family Medicine

## 2019-10-29 DIAGNOSIS — I10 Essential (primary) hypertension: Secondary | ICD-10-CM

## 2019-10-29 MED ORDER — AMLODIPINE BESYLATE 5 MG PO TABS: 5.0000 mg | ORAL_TABLET | Freq: Every day | ORAL | 3 refills | Status: DC

## 2019-10-29 MED ORDER — LOSARTAN POTASSIUM 25 MG PO TABS: 25.0000 mg | ORAL_TABLET | Freq: Every day | ORAL | 1 refills | Status: DC

## 2019-10-29 MED ORDER — ROSUVASTATIN CALCIUM 10 MG PO TABS: 10.0000 mg | ORAL_TABLET | Freq: Every day | ORAL | 1 refills | Status: DC

## 2019-10-29 NOTE — Telephone Encounter (Signed)
Patient had a virtual with Dr. Caryl Bis on Monday, 10/25/19. Per patient Dr. Caryl Bis did not call in all of her medications. She did not receive the following; amLODipine (NORVASC) 5 MG tablet, losartan (COZAAR) 25 MG tablet, rosuvastatin (CRESTOR) 20 MG tablet, FYI Dr. Caryl Bis and patient discuss changing or cutting dosage of Crestor. These are all 90 day supply mail in rebate. Do you want me to reorder theses or were you waitning on her labs, she has a lab appt scheduled next week.  Monica Larson,cma

## 2019-10-29 NOTE — Telephone Encounter (Signed)
Patient had a virtual with Dr. Caryl Bis on Monday, 10/25/19. Per patient Dr. Caryl Bis did not call in all of her medications. She did not receive the following; amLODipine (NORVASC) 5 MG tablet, losartan (COZAAR) 25 MG tablet, rosuvastatin (CRESTOR) 20 MG tablet, FYI Dr. Caryl Bis and patient discuss changing or cutting dosage of Crestor. These are all 90 day supply mail in rebate.

## 2019-10-29 NOTE — Telephone Encounter (Signed)
I do not remember those medications being requested. I will send them in.

## 2019-11-01 NOTE — Progress Notes (Signed)
Cardiology Office Note  Date:  11/02/2019   ID:  Ramatoulaye, [place] 1946-06-29, MRN [id number]  PCP:  Leone Haven, MD   Chief Complaint  Patient presents with  . New Patient (Initial Visit)    Chest pain; Meds verbally reviewed with patient.    HPI:  Monica Larson is a 74 year old woman with past medical history of Anxiety Minimal smoking, stopped Obesity Hyperlipidemia GERD HTN chronic left-sided chest pain Who presents for follow-up of her chest pain and SOB Seen 06/26/2016 by Dr. Yvone Neu (similar sx at that time)  Last seen in our office 2019  Stress test 2017 Pharmacological myocardial perfusion imaging study with no significant  ischemia Normal wall motion, EF estimated at 79% No EKG changes concerning for ischemia at peak stress or in recovery. Low risk scan  Echo in 07/2016 Normal study  [place] at home, walking, Gets tightness in chest, hurts  On crestor 10 mg daily  BP elevated today, Just took meds this Am Pressure at home 150s  EKG personally reviewed by myself on todays visit NSr rate 72 bpm  CT ABD/pelvis in 2017 Moderate calcified atherosclerotic disease involves the abdominal aorta.   Carotid 2011, L>R soft plaque   PMH:   has a past medical history of Arthritis, Asthma, Diarrhea, Fatigue, GERD (gastroesophageal reflux disease), H/O motion sickness (06/17/2016), Headache, HLD (hyperlipidemia), HTN (hypertension), Motion sickness, Seasonal allergies, and Shortness of breath dyspnea.  PSH:    Past Surgical History:  Procedure Laterality Date  . BREAST BIOPSY Bilateral    NEG  . BREAST EXCISIONAL BIOPSY Bilateral    NEG  . CARPAL TUNNEL RELEASE     right wrist  . CHOLECYSTECTOMY    . COLONOSCOPY N/A 01/31/2016   Procedure: COLONOSCOPY;  Surgeon: Hulen Luster, MD;  Location: [facility];  Service: Gastroenterology;  Laterality: N/A;  . EYE SURGERY     muscular as child/ CATARACT BILATERAL  . SKIN CANCER EXCISION Right Wrist and thigh (x2)    Unsure of dates and Dermatologist  . VESICOVAGINAL FISTULA CLOSURE W/ TAH      Current Outpatient Medications  Medication Sig Dispense Refill  . amLODipine (NORVASC) 5 MG tablet Take 1 tablet (5 mg total) by mouth daily. 90 tablet 3  . aspirin (ASPIR-LOW) 81 MG EC tablet Take 81 mg by mouth daily.     . diphenhydrAMINE (BENADRYL) 25 MG tablet Take 25 mg by mouth daily as needed for itching, allergies or sleep.    . DULoxetine (CYMBALTA) 30 MG capsule Take 1 capsule (30 mg total) by mouth daily. 30 capsule 3  . erythromycin ophthalmic ointment Place 1 application into both eyes as needed.     . gabapentin (NEURONTIN) 100 MG capsule Take 100 mg by mouth daily as needed.    . gabapentin (NEURONTIN) 300 MG capsule Take 1 capsule (300 mg total) by mouth at bedtime. 90 capsule 1  . losartan (COZAAR) 25 MG tablet Take 1 tablet (25 mg total) by mouth daily. In am 90 tablet 1  . Multiple Vitamins-Minerals (MULTIVITAMIN WITH MINERALS) tablet Take 1 tablet by mouth daily.     . Omega-3 Fatty Acids (OMEGA-3 FISH OIL PO) Take 1 capsule by mouth daily.     Marland Kitchen omeprazole (PRILOSEC) 20 MG capsule Take 1 capsule (20 mg total) by mouth daily as needed. For acid reflux 90 capsule 1  . oxybutynin (DITROPAN) 5 MG tablet Take 1 tablet (5 mg total) by mouth 3 (three) times daily. For urgency/  frequency.  You can take two in AM and another in the evening as needed. 180 tablet 3  . rosuvastatin (CRESTOR) 10 MG tablet Take 1 tablet (10 mg total) by mouth daily. 90 tablet 1  . solifenacin (VESICARE) 5 MG tablet Take 1 tablet (5 mg total) by mouth daily. 90 tablet 1  . traZODone (DESYREL) 150 MG tablet Take 1 tablet (150 mg total) by mouth at bedtime. 90 tablet 3   No current facility-administered medications for this visit.     Allergies:   Lisinopril, Shellfish allergy, Sulfa antibiotics, and Sulfonamide derivatives   Social History:  The patient  reports that she has quit smoking. She has never used smokeless  tobacco. She reports current alcohol use. She reports that she does not use drugs.   Family History:   family history includes Coronary artery disease in her father; Diabetes in her father; Heart attack in her brother and father; Ovarian cancer in her mother; Prostate cancer in her father.    Review of Systems: Review of Systems  Constitutional: Negative.   Respiratory: Negative.   Cardiovascular: Negative.   Gastrointestinal: Negative.   Musculoskeletal: Negative.   Neurological: Negative.   Psychiatric/Behavioral: Negative.   All other systems reviewed and are negative.   PHYSICAL EXAM: VS:  BP (!) 150/80 (BP Location: Right Arm, Patient Position: Sitting, Cuff Size: Normal)   Pulse 72   Ht 5\' 7"  (1.702 m)   Wt 184 lb (83.5 kg)   SpO2 96%   BMI 28.82 kg/m  , BMI Body mass index is 28.82 kg/m. GEN: Well nourished, well developed, in no acute distress  HEENT: normal  Neck: no JVD, carotid bruits, or masses Cardiac: RRR; no murmurs, rubs, or gallops,no edema  Respiratory:  clear to auscultation bilaterally, normal work of breathing GI: soft, nontender, nondistended, + BS MS: no deformity or atrophy  Skin: warm and dry, no rash Neuro:  Strength and sensation are intact Psych: euthymic mood, full affect   Recent Labs: No results found for requested labs within last 8760 hours.    Lipid Panel Lab Results  Component Value Date   CHOL 213 (H) 10/15/2017   HDL 50.10 10/15/2017   LDLCALC 146 (H) 10/15/2017   TRIG 87.0 10/15/2017      Wt Readings from Last 3 Encounters:  11/02/19 184 lb (83.5 kg)  10/26/19 185 lb (83.9 kg)  10/25/19 185 lb (83.9 kg)     ASSESSMENT AND PLAN:  Chest pain, unspecified type -  concrening for angina We will order a lexiscan myoview  Essential hypertension BP elevated, Wants to drop off numbers with PMD  Shortness of breath Continue walking program  Mixed hyperlipidemia On crestor, Labs pending  Aortic  atherosclerosis Mild-to-moderate on CT scan, reviewed again Goal LDL <70  Disposition:   F/U  12 months   Total encounter time more than 25 minutes  Greater than 50% was spent in counseling and coordination of care with the patient    Orders Placed This Encounter  Procedures  . EKG 12-Lead     Signed, Esmond Plants, M.D., Ph.D. 11/02/2019  [place], [place]

## 2019-11-02 ENCOUNTER — Encounter: Payer: Self-pay | Admitting: Cardiovascular Disease

## 2019-11-02 ENCOUNTER — Ambulatory Visit: Payer: PPO | Admitting: Cardiology

## 2019-11-02 ENCOUNTER — Other Ambulatory Visit: Payer: Self-pay

## 2019-11-02 ENCOUNTER — Ambulatory Visit (INDEPENDENT_AMBULATORY_CARE_PROVIDER_SITE_OTHER): Payer: PPO | Admitting: Cardiovascular Disease

## 2019-11-02 VITALS — BP 150/80 | HR 72 | Ht 67.0 in | Wt 184.0 lb

## 2019-11-02 DIAGNOSIS — I700 Atherosclerosis of aorta: Secondary | ICD-10-CM | POA: Diagnosis not present

## 2019-11-02 DIAGNOSIS — E782 Mixed hyperlipidemia: Secondary | ICD-10-CM

## 2019-11-02 DIAGNOSIS — I10 Essential (primary) hypertension: Secondary | ICD-10-CM

## 2019-11-02 DIAGNOSIS — I209 Angina pectoris, unspecified: Secondary | ICD-10-CM

## 2019-11-02 DIAGNOSIS — R0602 Shortness of breath: Secondary | ICD-10-CM

## 2019-11-02 DIAGNOSIS — R079 Chest pain, unspecified: Secondary | ICD-10-CM

## 2019-11-02 NOTE — Patient Instructions (Addendum)
Pharmacological myoview/lexiscan for anginal pain, Friday  Medication Instructions:  No changes  If you need a refill on your cardiac medications before your next appointment, please call your pharmacy.    Lab work: No new labs needed   If you have labs (blood work) drawn today and your tests are completely normal, you will receive your results only by: Marland Kitchen MyChart Message (if you have MyChart) OR . A paper copy in the mail If you have any lab test that is abnormal or we need to change your treatment, we will call you to review the results.   Testing/Procedures: [facility]  Your caregiver has ordered a Stress Test with nuclear imaging. The purpose of this test is to evaluate the blood supply to your heart muscle. This procedure is referred to as a "Non-Invasive Stress Test." This is because other than having an IV started in your vein, nothing is inserted or "invades" your body. Cardiac stress tests are done to find areas of poor blood flow to the heart by determining the extent of coronary artery disease (CAD). Some patients exercise on a treadmill, which naturally increases the blood flow to your heart, while others who are  unable to walk on a treadmill due to physical limitations have a pharmacologic/chemical stress agent called Lexiscan . This medicine will mimic walking on a treadmill by temporarily increasing your coronary blood flow.   Please note: these test may take anywhere between 2-4 hours to complete  PLEASE REPORT TO [facility] AT THE FIRST DESK WILL DIRECT YOU WHERE TO GO  Date of Procedure: Friday the 26th  Arrival Time for Procedure: 09:45 AM    PLEASE NOTIFY THE OFFICE AT LEAST 24 HOURS IN ADVANCE IF YOU ARE UNABLE TO KEEP YOUR APPOINTMENT.  [phone] AND  PLEASE NOTIFY NUCLEAR MEDICINE AT [facility] AT LEAST 24 HOURS IN ADVANCE IF YOU ARE UNABLE TO KEEP YOUR APPOINTMENT. [phone]  How to prepare for your Myoview test:  1. Do  not eat or drink after midnight 2. No caffeine for 24 hours prior to test 3. No smoking 24 hours prior to test. 4. Your medication may be taken with water.  If your doctor stopped a medication because of this test, do not take that medication. 5. Ladies, please do not wear dresses.  Skirts or pants are appropriate. Please wear a short sleeve shirt. 6. No perfume, cologne or lotion. 7. Wear comfortable walking shoes. No heels!   Follow-Up: At [facility], you and your health needs are our priority.  As part of our continuing mission to provide you with exceptional heart care, we have created designated Provider Care Teams.  These Care Teams include your primary Cardiologist (physician) and Advanced Practice Providers (APPs -  Physician Assistants and Nurse Practitioners) who all work together to provide you with the care you need, when you need it.  . You will need a follow up appointment in 12 months   . Providers on your designated Care Team:   . Murray Hodgkins, NP . Christell Faith, PA-C . Marrianne Mood, PA-C  Any Other Special Instructions Will Be Listed Below (If Applicable).  For educational health videos Log in to : [email or web address] Or : [email or web address], password : [email or web address]

## 2019-11-04 ENCOUNTER — Other Ambulatory Visit: Payer: Self-pay

## 2019-11-04 ENCOUNTER — Other Ambulatory Visit: Payer: Self-pay | Admitting: Family Medicine

## 2019-11-04 ENCOUNTER — Other Ambulatory Visit (INDEPENDENT_AMBULATORY_CARE_PROVIDER_SITE_OTHER): Payer: PPO

## 2019-11-04 DIAGNOSIS — R8290 Unspecified abnormal findings in urine: Secondary | ICD-10-CM

## 2019-11-04 DIAGNOSIS — R448 Other symptoms and signs involving general sensations and perceptions: Secondary | ICD-10-CM | POA: Diagnosis not present

## 2019-11-04 DIAGNOSIS — E782 Mixed hyperlipidemia: Secondary | ICD-10-CM | POA: Diagnosis not present

## 2019-11-04 DIAGNOSIS — R3129 Other microscopic hematuria: Secondary | ICD-10-CM | POA: Diagnosis not present

## 2019-11-04 DIAGNOSIS — I10 Essential (primary) hypertension: Secondary | ICD-10-CM | POA: Diagnosis not present

## 2019-11-04 LAB — COMPREHENSIVE METABOLIC PANEL
ALT: 20 U/L (ref 0–35)
AST: 16 U/L (ref 0–37)
Albumin: 4 g/dL (ref 3.5–5.2)
Alkaline Phosphatase: 63 U/L (ref 39–117)
BUN: 14 mg/dL (ref 6–23)
CO2: 28 mEq/L (ref 19–32)
Calcium: 9.2 mg/dL (ref 8.4–10.5)
Chloride: 108 mEq/L (ref 96–112)
Creatinine, Ser: 0.73 mg/dL (ref 0.40–1.20)
GFR: 77.8 mL/min (ref >60.00)
Glucose, Bld: 112 mg/dL — ABNORMAL HIGH (ref 70–99)
Potassium: 3.8 mEq/L (ref 3.5–5.1)
Sodium: 143 mEq/L (ref 135–145)
Total Bilirubin: 0.6 mg/dL (ref 0.2–1.2)
Total Protein: 6.8 g/dL (ref 6.0–8.3)

## 2019-11-04 LAB — CBC
HCT: 43 % (ref 36.0–46.0)
Hemoglobin: 14.3 g/dL (ref 12.0–15.0)
MCHC: 33.4 g/dL (ref 30.0–36.0)
MCV: 95.2 fl (ref 78.0–100.0)
Platelets: 167 10*3/uL (ref 150.0–400.0)
RBC: 4.52 Mil/uL (ref 3.87–5.11)
RDW: 13.7 % (ref 11.5–15.5)
WBC: 4.2 10*3/uL (ref 4.0–10.5)

## 2019-11-04 LAB — URINALYSIS, MICROSCOPIC ONLY

## 2019-11-04 LAB — LIPID PANEL
Cholesterol: 149 mg/dL (ref 0–200)
HDL: 64.8 mg/dL (ref >39.00)
LDL Cholesterol: 70 mg/dL (ref 0–99)
NonHDL: 83.81
Total CHOL/HDL Ratio: 2
Triglycerides: 71 mg/dL (ref 0.0–149.0)
VLDL: 14.2 mg/dL (ref 0.0–40.0)

## 2019-11-04 LAB — POCT URINALYSIS DIPSTICK
Bilirubin, UA: NEGATIVE
Glucose, UA: NEGATIVE
Ketones, UA: NEGATIVE
Leukocytes, UA: NEGATIVE
Nitrite, UA: NEGATIVE
Protein, UA: NEGATIVE
Spec Grav, UA: 1.025 (ref 1.010–1.025)
Urobilinogen, UA: 0.2 E.U./dL
pH, UA: 5 (ref 5.0–8.0)

## 2019-11-04 LAB — TSH: TSH: 0.89 u[IU]/mL (ref 0.35–4.50)

## 2019-11-04 NOTE — Addendum Note (Signed)
Addended by: Leeanne Rio on: 11/04/2019 10:07 AM   Modules accepted: Orders

## 2019-11-05 ENCOUNTER — Ambulatory Visit
Admission: RE | Admit: 2019-11-05 | Discharge: 2019-11-05 | Disposition: A | Discharge status: Home / Self Care | Payer: PPO | Source: Ambulatory Visit | Attending: Cardiovascular Disease | Admitting: Cardiovascular Disease

## 2019-11-05 DIAGNOSIS — I209 Angina pectoris, unspecified: Secondary | ICD-10-CM

## 2019-11-05 LAB — HEMOGLOBIN A1C: Hgb A1c MFr Bld: 5.8 % (ref 4.6–6.5)

## 2019-11-05 LAB — NM MYOCAR MULTI W/SPECT W/WALL MOTION / EF
Estimated workload: 1 METS
Exercise duration (min): 0 min
Exercise duration (sec): 0 s
LV dias vol: 70 mL (ref 46–106)
LV sys vol: 22 mL
MPHR: 146 {beats}/min
Peak HR: 96 {beats}/min
Percent HR: 65 %
Rest HR: 64 {beats}/min
SDS: 0
SRS: 3
SSS: 1
TID: 0.95

## 2019-11-05 MED ORDER — TECHNETIUM TC 99M TETROFOSMIN IV KIT
32.4900 | PACK | Freq: Once | INTRAVENOUS | Status: AC | PRN
  Administered 2019-11-05: 32.49 via INTRAVENOUS

## 2019-11-05 MED ORDER — TECHNETIUM TC 99M TETROFOSMIN IV KIT
10.0000 | PACK | Freq: Once | INTRAVENOUS | Status: AC | PRN
  Administered 2019-11-05: 10.48 via INTRAVENOUS

## 2019-11-05 MED ORDER — REGADENOSON 0.4 MG/5ML IV SOLN
0.4000 mg | Freq: Once | INTRAVENOUS | Status: AC
  Administered 2019-11-05: 0.4 mg via INTRAVENOUS

## 2019-11-17 DIAGNOSIS — M545 Low back pain: Secondary | ICD-10-CM | POA: Diagnosis not present

## 2019-11-17 DIAGNOSIS — M25552 Pain in left hip: Secondary | ICD-10-CM | POA: Diagnosis not present

## 2019-11-17 DIAGNOSIS — M7061 Trochanteric bursitis, right hip: Secondary | ICD-10-CM | POA: Diagnosis not present

## 2019-11-17 DIAGNOSIS — M25551 Pain in right hip: Secondary | ICD-10-CM | POA: Diagnosis not present

## 2019-11-17 DIAGNOSIS — M7062 Trochanteric bursitis, left hip: Secondary | ICD-10-CM | POA: Diagnosis not present

## 2019-11-22 DIAGNOSIS — M545 Low back pain: Secondary | ICD-10-CM | POA: Diagnosis not present

## 2019-11-24 DIAGNOSIS — M545 Low back pain: Secondary | ICD-10-CM | POA: Diagnosis not present

## 2019-11-26 DIAGNOSIS — M62551 Muscle wasting and atrophy, not elsewhere classified, right thigh: Secondary | ICD-10-CM | POA: Diagnosis not present

## 2019-11-30 DIAGNOSIS — M545 Low back pain: Secondary | ICD-10-CM | POA: Diagnosis not present

## 2019-12-02 DIAGNOSIS — M545 Low back pain: Secondary | ICD-10-CM | POA: Diagnosis not present

## 2019-12-07 DIAGNOSIS — M545 Low back pain: Secondary | ICD-10-CM | POA: Diagnosis not present

## 2019-12-09 MED ORDER — AMLODIPINE BESYLATE 10 MG PO TABS: 10.0000 mg | ORAL_TABLET | Freq: Every day | ORAL | 1 refills | Status: DC

## 2019-12-09 NOTE — Addendum Note (Signed)
Addended by: Elpidio Galea T on: 12/09/2019 11:21 AM   Modules accepted: Orders

## 2019-12-21 DIAGNOSIS — M545 Low back pain: Secondary | ICD-10-CM | POA: Diagnosis not present

## 2019-12-27 DIAGNOSIS — M62551 Muscle wasting and atrophy, not elsewhere classified, right thigh: Secondary | ICD-10-CM | POA: Diagnosis not present

## 2019-12-29 ENCOUNTER — Other Ambulatory Visit: Payer: Self-pay

## 2019-12-29 ENCOUNTER — Ambulatory Visit (INDEPENDENT_AMBULATORY_CARE_PROVIDER_SITE_OTHER): Payer: PPO | Admitting: Family Medicine

## 2019-12-29 ENCOUNTER — Encounter: Payer: Self-pay | Admitting: Family Medicine

## 2019-12-29 VITALS — BP 125/70 | HR 82 | Temp 95.4°F | Ht 67.0 in | Wt 189.6 lb

## 2019-12-29 DIAGNOSIS — R2689 Other abnormalities of gait and mobility: Secondary | ICD-10-CM | POA: Diagnosis not present

## 2019-12-29 DIAGNOSIS — R350 Frequency of micturition: Secondary | ICD-10-CM

## 2019-12-29 DIAGNOSIS — N3001 Acute cystitis with hematuria: Secondary | ICD-10-CM | POA: Diagnosis not present

## 2019-12-29 DIAGNOSIS — G629 Polyneuropathy, unspecified: Secondary | ICD-10-CM

## 2019-12-29 LAB — POCT URINALYSIS DIPSTICK
Bilirubin, UA: NEGATIVE
Blood, UA: POSITIVE
Glucose, UA: NEGATIVE
Ketones, UA: NEGATIVE
Leukocytes, UA: NEGATIVE
Nitrite, UA: NEGATIVE
Protein, UA: POSITIVE — AB
Spec Grav, UA: >=1.03 — AB (ref 1.010–1.025)
Urobilinogen, UA: 0.2 E.U./dL
pH, UA: 5.5 (ref 5.0–8.0)

## 2019-12-29 MED ORDER — CEPHALEXIN 500 MG PO CAPS: 500.0000 mg | ORAL_CAPSULE | Freq: Two times a day (BID) | ORAL | 0 refills | Status: DC

## 2019-12-29 NOTE — Assessment & Plan Note (Signed)
This is a chronic ongoing issue though has been worse over the last 3 to 4 months.  I suspect this is related to her neuropathy or her gabapentin.  She had an MRI in 2019 ongoing reassuring.  We will work on changing her gabapentin to Lyrica.  We will see if this makes a difference for her symptoms.

## 2019-12-29 NOTE — Patient Instructions (Signed)
Nice to see you. I will contact you when I hear back from our clinical pharmacist regarding the gabapentin to Lyrica. Please take the Keflex for UTI.

## 2019-12-29 NOTE — Progress Notes (Signed)
[name], MD Phone: [phone]  Monica Larson is a 74 y.o. female who presents today for same day visit.   UTI: Dysuria-yes. Frequency-yes.  Urgency-yes.  Hematuria-no.  Abd pain-some lower suprapubic discomfort.  Vaginal d/c-no. Going on 2 days.  Balance difficulty: Patient notes this is been a chronic ongoing issue though has been worse over the last 3 to 4 months.  Feels like a drunk at times when she starts to walk she will lean in one direction.  Blood pressures have been in the 140s over 70s.  She has been taking muscle relaxer though symptoms worsen prior to that.  Some occasional lightheadedness though no significant vertigo.  Does have a history of right-sided Bell's palsy.  She does have neuropathy and is on gabapentin though she cannot take that during the day as it makes her feel uncoordinated.   Social History   Tobacco Use  Smoking Status Former Smoker  Smokeless Tobacco Never Used  Tobacco Comment   1 cig every 3 months      ROS see history of present illness  Objective  Physical Exam Vitals:   12/29/19 1524  BP: 125/70  Pulse: 82  Temp: (!) 95.4 F (35.2 C)  SpO2: 99%    BP Readings from Last 3 Encounters:  12/29/19 125/70  11/02/19 (!) 150/80  11/13/18 (!) 145/69   Wt Readings from Last 3 Encounters:  12/29/19 189 lb 9.6 oz (86 kg)  11/02/19 184 lb (83.5 kg)  10/26/19 185 lb (83.9 kg)    Physical Exam Constitutional:      General: She is not in acute distress.    Appearance: She is not diaphoretic.  Cardiovascular:     Rate and Rhythm: Normal rate and regular rhythm.     Heart sounds: Normal heart sounds.  Pulmonary:     Effort: Pulmonary effort is normal.     Breath sounds: Normal breath sounds.  Skin:    General: Skin is warm and dry.  Neurological:     Mental Status: She is alert.     Comments: EOMI, PERRL, facial sensation intact bilaterally, hearing intact to finger rub, able to close eyelids and raise eyebrows adequately,  shoulder shrug intact, 5/5 strength in bilateral biceps, triceps, grip, quads, hamstrings, plantar and dorsiflexion, sensation to light touch intact in bilateral UE and LE, normal gait, no pronator drift, she does sway some on Romberg      Assessment/Plan: Please see individual problem list.  Balance problem This is a chronic ongoing issue though has been worse over the last 3 to 4 months.  I suspect this is related to her neuropathy or her gabapentin.  She had an MRI in 2019 ongoing reassuring.  We will work on changing her gabapentin to Lyrica.  We will see if this makes a difference for her symptoms.  Neuropathy I will contact our clinical pharmacist to work on changing her from gabapentin to Lyrica.  UTI (urinary tract infection) Symptoms are concerning for UTI.  Will empirically treat with Keflex.  Will send for culture microscopy.    Orders Placed This Encounter  Procedures  . Urine Culture  . Urine Microscopic  . POCT Urinalysis Dipstick    Meds ordered this encounter  Medications  . cephALEXin (KEFLEX) 500 MG capsule    Sig: Take 1 capsule (500 mg total) by mouth 2 (two) times daily.    Dispense:  14 capsule    Refill:  0    This visit occurred during the  SARS-CoV-2 public health emergency.  Safety protocols were in place, including screening questions prior to the visit, additional usage of staff PPE, and extensive cleaning of exam room while observing appropriate contact time as indicated for disinfecting solutions.    Marvalene Barrett, MD [facility] [place]  

## 2019-12-29 NOTE — Assessment & Plan Note (Addendum)
I will contact our clinical pharmacist to work on changing her from gabapentin to Lyrica.

## 2019-12-29 NOTE — Assessment & Plan Note (Signed)
Symptoms are concerning for UTI.  Will empirically treat with Keflex.  Will send for culture microscopy.

## 2019-12-30 LAB — URINE CULTURE
MICRO NUMBER:: 10389860
SPECIMEN QUALITY:: ADEQUATE

## 2019-12-30 LAB — URINALYSIS, MICROSCOPIC ONLY

## 2020-01-03 ENCOUNTER — Telehealth: Payer: Self-pay

## 2020-01-03 MED ORDER — PREGABALIN 100 MG PO CAPS: 100.0000 mg | ORAL_CAPSULE | Freq: Two times a day (BID) | ORAL | 1 refills | Status: DC

## 2020-01-03 NOTE — Telephone Encounter (Signed)
-----   Message from De Hollingshead, [facility] sent at 12/29/2019  4:27 PM EDT ----- Marykay Lex! So she's taking 2 of the 300 mg? You can do a direct switch. I'd probably start w/ pregabalin 100 mg BID.   Is she still seeing Dr. Jannifer Franklin? Her duloxetine could also be increased to 60 mg  Catie ----- Message ----- From: Leone Haven, MD Sent: 12/29/2019   3:41 PM EDT To: De Hollingshead, [facility]  Hey Catie,   I want to transition this patient from gabapentin 600 mg nightly to lyrica for her neuropathy. How should I taper down on the gabapentin to make this transition or could I directly change to lyrica? Thanks.   Randall Hiss

## 2020-01-03 NOTE — Telephone Encounter (Signed)
Sent to her mail order by accident. Can you call and cancel that? Resent to her local pharmacy. She can switch directly from the gabapentin to the lyrica. She will start the lyrica the day after stopping the gabapentin.

## 2020-01-03 NOTE — Telephone Encounter (Signed)
I called [facility] pharmacy and the Lyrica order was cancelled.  Erie Sica,cma

## 2020-01-24 NOTE — Progress Notes (Signed)
01/25/20 3:43 PM   Monica Larson [birth year] [id number]  Referring provider: Leone Haven, MD [address] [place],  [place] [address] Chief Complaint  Patient presents with  . Medication Refill    HPI: Monica Larson is a 74 y.o. F who presents today for routine annual follow up for multiple GU issus.    She was previously followed by Dr. Jacqlyn Larson for personal history of urethral stricture.  She was cathing 2-3 times a day for it for very low volumes.  Since last visit, she has cut back.   She only caths about once per month when she cannot empty her bladder and reports some mild difficulties w/ inserting catheter.   She also has a personal history of urinary urge incontinence and detrusor overactivity. She was previously taking Toviaz.  She was taking oxybutin 10 mg (non XL) in the AM.  Before that she was on imipramine 25 mg and Toviaz.  Today, she reports that she has been taking oxybutynin 3 times a day.  About 2 weeks ago she ran out of this medication and they did not get sorted into her pillbox.  She has noticed a significant difference since being off these medications had increased urinary urgency frequency.  She also has Vesicare listed as one of her medications but she denies taking this.  She has a personal history of cystocele status post anterior repair and sling operation for stress urinary incontinence 08/2014 by Monica Larson.  She has a history of total vaginal hysterectomy 1987.  She has seen improvement in rUTI prevention w/ use of topical estrogen cream 3x a week.  PMH: Past Medical History:  Diagnosis Date  . Arthritis    HANDS/NECK/SHOULDERS  . Asthma    SEASONAL  . Diarrhea    INTERMITTENT CHRONIC  . Fatigue   . GERD (gastroesophageal reflux disease)   . H/O motion sickness 06/17/2016  . Headache    SINUS/ ALLERGIES  . HLD (hyperlipidemia)    borderline  . HTN (hypertension)    CONTROLLED ON MEDS  . Motion sickness   . Seasonal allergies    . Shortness of breath dyspnea    ON EXERTION    Surgical History: Past Surgical History:  Procedure Laterality Date  . BREAST BIOPSY Bilateral    NEG  . BREAST EXCISIONAL BIOPSY Bilateral    NEG  . CARPAL TUNNEL RELEASE     right wrist  . CHOLECYSTECTOMY    . COLONOSCOPY N/A 01/31/2016   Procedure: COLONOSCOPY;  Surgeon: Hulen Luster, MD;  Location: [facility];  Service: Gastroenterology;  Laterality: N/A;  . EYE SURGERY     muscular as child/ CATARACT BILATERAL  . SKIN CANCER EXCISION Right Wrist and thigh (x2)   Unsure of dates and Dermatologist  . VESICOVAGINAL FISTULA CLOSURE W/ TAH      Home Medications:  Allergies as of 01/25/2020      Reactions   Lisinopril    Cough   Shellfish Allergy    Sulfa Antibiotics    hives   Sulfonamide Derivatives       Medication List       Accurate as of Jan 25, 2020  3:43 PM. If you have any questions, ask your nurse or doctor.        STOP taking these medications   cephALEXin 500 MG capsule Commonly known as: KEFLEX Stopped by: Hollice Espy, MD   erythromycin ophthalmic ointment Stopped by: Hollice Espy, MD  oxybutynin 5 MG tablet Commonly known as: DITROPAN Replaced by: oxybutynin 15 MG 24 hr tablet Stopped by: Hollice Espy, MD   solifenacin 5 MG tablet Commonly known as: VESICARE Stopped by: Hollice Espy, MD     TAKE these medications   amLODipine 10 MG tablet Commonly known as: NORVASC Take 1 tablet (10 mg total) by mouth daily.   amoxicillin 500 MG capsule Commonly known as: AMOXIL   Aspir-Low 81 MG EC tablet Generic drug: aspirin Take 81 mg by mouth daily.   diphenhydrAMINE 25 MG tablet Commonly known as: BENADRYL Take 25 mg by mouth daily as needed for itching, allergies or sleep.   DULoxetine 30 MG capsule Commonly known as: Cymbalta Take 1 capsule (30 mg total) by mouth daily.   losartan 25 MG tablet Commonly known as: Cozaar Take 1 tablet (25 mg total) by mouth daily. In am    multivitamin with minerals tablet Take 1 tablet by mouth daily.   OMEGA-3 FISH OIL PO Take 1 capsule by mouth daily.   omeprazole 20 MG capsule Commonly known as: PRILOSEC Take 1 capsule (20 mg total) by mouth daily as needed. For acid reflux   oxybutynin 15 MG 24 hr tablet Commonly known as: DITROPAN XL Take 1 tablet (15 mg total) by mouth daily. Replaces: oxybutynin 5 MG tablet Started by: Hollice Espy, MD   pregabalin 100 MG capsule Commonly known as: Lyrica Take 1 capsule (100 mg total) by mouth 2 (two) times daily.   rosuvastatin 10 MG tablet Commonly known as: CRESTOR Take 1 tablet (10 mg total) by mouth daily.   traZODone 150 MG tablet Commonly known as: DESYREL Take 1 tablet (150 mg total) by mouth at bedtime.   triazolam 0.25 MG tablet Commonly known as: HALCION Take 0.5 mg by mouth at bedtime as needed.       Allergies:  Allergies  Allergen Reactions  . Lisinopril     Cough   . Shellfish Allergy   . Sulfa Antibiotics     hives  . Sulfonamide Derivatives     Family History: Family History  Problem Relation Age of Onset  . Ovarian cancer Mother   . Prostate cancer Father   . Coronary artery disease Father   . Diabetes Father   . Heart attack Father   . Heart attack Brother   . Breast cancer Neg Hx     Social History:  reports that she has quit smoking. She has never used smokeless tobacco. She reports current alcohol use. She reports that she does not use drugs.   Physical Exam: BP 129/68   Pulse 76   Ht 5\' 7"  (1.702 m)   Wt 190 lb (86.2 kg)   BMI 29.76 kg/m   Constitutional:  Alert and oriented, No acute distress. HEENT: [place] AT, moist mucus membranes.  Trachea midline, no masses. Cardiovascular: No clubbing, cyanosis, or edema. Respiratory: Normal respiratory effort, no increased work of breathing. Skin: No rashes, bruises or suspicious lesions. Neurologic: Grossly intact, no focal deficits, moving all 4 extremities. Psychiatric:  Normal mood and affect.  Laboratory Data:  Lab Results  Component Value Date   CREATININE 0.73 11/04/2019    Lab Results  Component Value Date   HGBA1C 5.8 11/04/2019   Pertinent Imaging: Results for orders placed or performed in visit on 01/25/20  BLADDER SCAN AMB NON-IMAGING  Result Value Ref Range   Scan Result 0 ML    Assessment & Plan:    1. OAB, urge incontinence Adequate  emptying of bladder  Stable on oxybutynin but has difficulty taking medication x 3 per day  Rx of oxybutynin 15 mg XL sent to pharmacy to help w/ compliance  Advised pt to call back if medication not covered by insurance.  F/u in 1 year   2. Hx of urethral stricture  Self-cath prn  Agree with decreased frequency as tolerated  2. Hx of rUTIs Topical Estrogen cream  X 3 per week, effective  Continue above Previously on ppx abx which prefer to avoid  F/u 1 year   [facility] [address] [place], [place] [address] [phone]  I, Lucas Mallow, am acting as a scribe for Dr. Hollice Espy,  I have reviewed the above documentation for accuracy and completeness, and I agree with the above.   Hollice Espy, MD

## 2020-01-25 ENCOUNTER — Ambulatory Visit: Payer: PPO | Admitting: Urology

## 2020-01-25 ENCOUNTER — Other Ambulatory Visit: Payer: Self-pay

## 2020-01-25 VITALS — BP 129/68 | HR 76 | Ht 67.0 in | Wt 190.0 lb

## 2020-01-25 DIAGNOSIS — N393 Stress incontinence (female) (male): Secondary | ICD-10-CM | POA: Diagnosis not present

## 2020-01-25 LAB — BLADDER SCAN AMB NON-IMAGING: Scan Result: 0

## 2020-01-25 MED ORDER — OXYBUTYNIN CHLORIDE ER 15 MG PO TB24: 15.0000 mg | ORAL_TABLET | Freq: Every day | ORAL | 11 refills | Status: DC

## 2020-01-26 DIAGNOSIS — M62551 Muscle wasting and atrophy, not elsewhere classified, right thigh: Secondary | ICD-10-CM | POA: Diagnosis not present

## 2020-01-31 ENCOUNTER — Telehealth: Payer: Self-pay

## 2020-01-31 ENCOUNTER — Telehealth: Payer: Self-pay | Admitting: Family Medicine

## 2020-01-31 NOTE — Telephone Encounter (Signed)
I spoke with the patient and informed her to take one pill a day and see if this help the swelling in her ankle and I also informed her that she still should be taking the Cymbalta, she ask if she should take the medication at night and I informed her yes she should and she understood.  Lizzete Gough,cma

## 2020-01-31 NOTE — Telephone Encounter (Signed)
She can try taking it once daily and see if it is as beneficial. She should still be on the cymbalta.

## 2020-01-31 NOTE — Telephone Encounter (Signed)
Pt called she has been having ankle swelling since starting the pregabalin (LYRICA) 100 MG capsule and wanted to know if she could only take this at night instead of twice a day. Since it is working  Pt also wanted to know if she should still be taking the CYMBALTA

## 2020-01-31 NOTE — Telephone Encounter (Signed)
Pt called she has been having ankle swelling since starting the pregabalin (LYRICA) 100 MG capsule and wanted to know if she could only take this at night instead of twice a day. Since it is working  Pt also wanted to know if she should still be taking the Price. Aerica Rincon,cma

## 2020-01-31 NOTE — Telephone Encounter (Signed)
LVM for the patient to call back. Mehgan Santmyer,cma  

## 2020-02-02 DIAGNOSIS — M25561 Pain in right knee: Secondary | ICD-10-CM | POA: Diagnosis not present

## 2020-02-02 DIAGNOSIS — M5136 Other intervertebral disc degeneration, lumbar region: Secondary | ICD-10-CM | POA: Diagnosis not present

## 2020-02-02 DIAGNOSIS — M1711 Unilateral primary osteoarthritis, right knee: Secondary | ICD-10-CM | POA: Diagnosis not present

## 2020-02-02 DIAGNOSIS — M545 Low back pain: Secondary | ICD-10-CM | POA: Diagnosis not present

## 2020-02-17 NOTE — Telephone Encounter (Signed)
Error Christepher Melchior,cma

## 2020-02-21 ENCOUNTER — Encounter: Payer: Self-pay | Admitting: Family Medicine

## 2020-02-21 ENCOUNTER — Other Ambulatory Visit: Payer: Self-pay

## 2020-02-21 ENCOUNTER — Ambulatory Visit (INDEPENDENT_AMBULATORY_CARE_PROVIDER_SITE_OTHER): Payer: PPO | Admitting: Family Medicine

## 2020-02-21 DIAGNOSIS — E782 Mixed hyperlipidemia: Secondary | ICD-10-CM | POA: Diagnosis not present

## 2020-02-21 DIAGNOSIS — R2689 Other abnormalities of gait and mobility: Secondary | ICD-10-CM

## 2020-02-21 DIAGNOSIS — I10 Essential (primary) hypertension: Secondary | ICD-10-CM

## 2020-02-21 DIAGNOSIS — M79676 Pain in unspecified toe(s): Secondary | ICD-10-CM

## 2020-02-21 DIAGNOSIS — G629 Polyneuropathy, unspecified: Secondary | ICD-10-CM

## 2020-02-21 MED ORDER — TETANUS-DIPHTHERIA TOXOIDS TD 5-2 LFU IM INJ: 0.5000 mL | INJECTION | Freq: Once | INTRAMUSCULAR | 0 refills | Status: AC

## 2020-02-21 MED ORDER — AMLODIPINE BESYLATE 5 MG PO TABS: 5.0000 mg | ORAL_TABLET | Freq: Every day | ORAL | 3 refills | Status: DC

## 2020-02-21 NOTE — Assessment & Plan Note (Signed)
Continue Crestor 

## 2020-02-21 NOTE — Assessment & Plan Note (Signed)
Well controlled. Continue current medication.  

## 2020-02-21 NOTE — Assessment & Plan Note (Signed)
This could represent a soft tissue injury versus a fracture.  I discussed these things with her.  Discussed buddy taping and wearing hard soled shoes.  Discussed that we could get an x-ray though it would be unlikely to change management.  She will monitor and if worsening or not improving she will let us know.

## 2020-02-21 NOTE — Assessment & Plan Note (Signed)
Continue Lyrica. 

## 2020-02-21 NOTE — Progress Notes (Signed)
[name], MD Phone: [phone]  Monica Larson is a 74 y.o. female who presents today for f/u.  HYPERTENSION  Disease Monitoring  Home BP Monitoring similar to today Chest pain- no    Dyspnea- no Medications  Compliance-  Taking amlodipine 5 mg and losartan.   Edema- had some on the 10 mg dose of amlodipine, decreased to 5 mg and the swelling resolved  HYPERLIPIDEMIA Symptoms Chest pain on exertion:  no    Medications: Compliance- taking crestor Right upper quadrant pain- no  Muscle aches- no  Balance/neuropathy: Patient notes the Lyrica has been quite beneficial for the neuropathy.  It does help her sleep as well.  The balance has improved some as well.  Right fifth toe injury: Patient notes she stopped this yesterday.  She had some pain and swelling and bruising since then.  She wonders if it is broken.  She has baseline numbness in her toes.  Social History   Tobacco Use  Smoking Status Former Smoker  Smokeless Tobacco Never Used  Tobacco Comment   1 cig every 3 months      ROS see history of present illness  Objective  Physical Exam Vitals:   02/21/20 0940  BP: 130/70  Pulse: 72  Temp: (!) 96.8 F (36 C)  SpO2: 95%    BP Readings from Last 3 Encounters:  02/21/20 130/70  01/25/20 129/68  12/29/19 125/70   Wt Readings from Last 3 Encounters:  02/21/20 188 lb (85.3 kg)  01/25/20 190 lb (86.2 kg)  12/29/19 189 lb 9.6 oz (86 kg)    Physical Exam Constitutional:      General: She is not in acute distress.    Appearance: She is not diaphoretic.  Cardiovascular:     Rate and Rhythm: Normal rate and regular rhythm.     Heart sounds: Normal heart sounds.  Pulmonary:     Effort: Pulmonary effort is normal.     Breath sounds: Normal breath sounds.  Musculoskeletal:     Right lower leg: No edema.     Left lower leg: No edema.     Comments: Right fifth toe with some bruising and mild swelling, there is no significant tenderness, good flexion and  extension, sensation intact, the toe is warm  Skin:    General: Skin is warm and dry.  Neurological:     Mental Status: She is alert.      Assessment/Plan: Please see individual problem list.  Balance problem Continue Lyrica.  Neuropathy Continue Lyrica.  Hypertension Well-controlled.  Continue current medication.  Mixed hyperlipidemia Continue Crestor.  Pain of fifth toe This could represent a soft tissue injury versus a fracture.  I discussed these things with her.  Discussed buddy taping and wearing hard soled shoes.  Discussed that we could get an x-ray though it would be unlikely to change management.  She will monitor and if worsening or not improving she will let us know.   Health Maintenance: Patient will get her tetanus vaccine at the pharmacy.  No orders of the defined types were placed in this encounter.   Meds ordered this encounter  Medications  . tetanus & diphtheria toxoids, adult, (TENIVAC) 5-2 LFU injection    Sig: Inject 0.5 mLs into the muscle once for 1 dose.    Dispense:  0.5 mL    Refill:  0  . amLODipine (NORVASC) 5 MG tablet    Sig: Take 1 tablet (5 mg total) by mouth daily.    Dispense:  90 tablet    Refill:  3    This visit occurred during the SARS-CoV-2 public health emergency.  Safety protocols were in place, including screening questions prior to the visit, additional usage of staff PPE, and extensive cleaning of exam room while observing appropriate contact time as indicated for disinfecting solutions.    [name], MD [place]

## 2020-02-21 NOTE — Patient Instructions (Signed)
Nice to see you. Please continue current dose of amlodipine.  If your blood pressure trends up at any point please let us know. Please continue your Lyrica. Please go to your pharmacy to get your tetanus vaccine.

## 2020-02-22 ENCOUNTER — Ambulatory Visit: Payer: PPO | Admitting: Family Medicine

## 2020-02-26 DIAGNOSIS — M62552 Muscle wasting and atrophy, not elsewhere classified, left thigh: Secondary | ICD-10-CM | POA: Diagnosis not present

## 2020-02-26 DIAGNOSIS — M62551 Muscle wasting and atrophy, not elsewhere classified, right thigh: Secondary | ICD-10-CM | POA: Diagnosis not present

## 2020-03-01 DIAGNOSIS — H52203 Unspecified astigmatism, bilateral: Secondary | ICD-10-CM | POA: Diagnosis not present

## 2020-03-01 DIAGNOSIS — H1789 Other corneal scars and opacities: Secondary | ICD-10-CM | POA: Diagnosis not present

## 2020-03-01 DIAGNOSIS — Z961 Presence of intraocular lens: Secondary | ICD-10-CM | POA: Diagnosis not present

## 2020-03-01 DIAGNOSIS — H53001 Unspecified amblyopia, right eye: Secondary | ICD-10-CM | POA: Diagnosis not present

## 2020-03-13 ENCOUNTER — Other Ambulatory Visit: Payer: Self-pay | Admitting: Neurology

## 2020-03-15 ENCOUNTER — Other Ambulatory Visit: Payer: Self-pay

## 2020-03-17 NOTE — Telephone Encounter (Signed)
This has not been filled in about a year and there were only 4 months prescribed to the patient by her previous neurologist. Please see if she has been taking this daily and if she has who has she gotten it from. If she has not been taking it please find out why she requested a refill. Thanks

## 2020-03-21 NOTE — Telephone Encounter (Signed)
Pt called checking on her medication refill  And wanted a call back from [facility]

## 2020-03-23 ENCOUNTER — Other Ambulatory Visit: Payer: Self-pay

## 2020-04-05 NOTE — Telephone Encounter (Signed)
I called the patient to inquire about the refill request for cymbalta , the patient stated she did get the cymbalta from Dr. Jannifer Franklin and she is no longer seeing him anymore and needs the medication, the patient scheduled a visit for a possible UTI and to discuss this medication.  Stevon Gough,cma

## 2020-04-07 ENCOUNTER — Ambulatory Visit (INDEPENDENT_AMBULATORY_CARE_PROVIDER_SITE_OTHER): Payer: PPO | Admitting: Family Medicine

## 2020-04-07 ENCOUNTER — Other Ambulatory Visit: Payer: Self-pay

## 2020-04-07 ENCOUNTER — Ambulatory Visit: Payer: PPO | Admitting: Family Medicine

## 2020-04-07 ENCOUNTER — Ambulatory Visit
Admission: EM | Admit: 2020-04-07 | Discharge: 2020-04-07 | Disposition: A | Discharge status: Home / Self Care | Payer: PPO | Attending: Emergency Medicine | Admitting: Emergency Medicine

## 2020-04-07 DIAGNOSIS — Z7689 Persons encountering health services in other specified circumstances: Secondary | ICD-10-CM | POA: Diagnosis not present

## 2020-04-07 DIAGNOSIS — N390 Urinary tract infection, site not specified: Secondary | ICD-10-CM | POA: Diagnosis not present

## 2020-04-07 DIAGNOSIS — R300 Dysuria: Secondary | ICD-10-CM

## 2020-04-07 LAB — POCT URINALYSIS DIP (MANUAL ENTRY)
Glucose, UA: NEGATIVE mg/dL
Glucose, UA: NEGATIVE mg/dL
Ketones, POC UA: NEGATIVE mg/dL
Nitrite, UA: POSITIVE — AB
Nitrite, UA: POSITIVE — AB
Protein Ur, POC: >=300 mg/dL — AB
Protein Ur, POC: >=300 mg/dL — AB
Spec Grav, UA: 1.02 (ref 1.010–1.025)
Spec Grav, UA: 1.015 (ref 1.010–1.025)
Urobilinogen, UA: 0.2 E.U./dL
Urobilinogen, UA: 0.2 E.U./dL
pH, UA: 5.5 (ref 5.0–8.0)
pH, UA: 5.5 (ref 5.0–8.0)

## 2020-04-07 MED ORDER — CEPHALEXIN 500 MG PO CAPS: 500.0000 mg | ORAL_CAPSULE | Freq: Two times a day (BID) | ORAL | 0 refills | Status: AC

## 2020-04-07 NOTE — Progress Notes (Signed)
Patient presented to the office for symptoms of chills, headache, balance difficulty, and dysuria.  CMA roomed the patient prior to learning of the symptoms.  Based on our current COVID-19 screening protocols we cannot complete an in person visit and in addition I did not pass my fit testing for the N95 mask so with [facility] current protocols I cannot see the patient in person.  CMA discussed this with the patient and noted we can do a virtual visit though I did advise that it may be best for her to be seen in person particularly with her balance difficulty so she can have a full evaluation.  Patient will go to urgent care for this.  Urine testing was completed prior to the patient getting to the room and it was positive for bilirubin, blood, protein, nitrites, and leukocytes.  Patient noted to the CMA that she had been taking Azo for her symptoms and that can alter her urine.  We will send her urine for culture.

## 2020-04-07 NOTE — ED Provider Notes (Signed)
Monica Larson    CSN: [id number] Arrival date & time: 04/07/20  1346      History   Chief Complaint Chief Complaint  Patient presents with   covid testing    HPI Monica Larson is a 74 y.o. female.   Patient presents with urinary frequency and dysuria today.  She also reports body aches and chills.  She was sent here by her PCP for a COVID test.  She denies fever, sore throat, cough, shortness of breath, vomiting, diarrhea, abdominal pain, back pain, or other symptoms.  Treatment attempted at home with Azo which she stopped taking after her PCP told her to stop.    The history is provided by the patient.    Past Medical History:  Diagnosis Date   Arthritis    HANDS/NECK/SHOULDERS   Asthma    SEASONAL   Diarrhea    INTERMITTENT CHRONIC   Fatigue    GERD (gastroesophageal reflux disease)    H/O motion sickness 06/17/2016   Headache    SINUS/ ALLERGIES   HLD (hyperlipidemia)    borderline   HTN (hypertension)    CONTROLLED ON MEDS   Motion sickness    Seasonal allergies    Shortness of breath dyspnea    ON EXERTION    Patient Active Problem List   Diagnosis Date Noted   Pain of fifth toe 02/21/2020   Balance problem 12/29/2019   GERD (gastroesophageal reflux disease) 10/25/2019   Feels cold 10/25/2019   H/O urethral stricture 09/18/2018   Skin lesion of breast 07/28/2018   Bilateral leg edema 07/28/2018   Neuropathy 12/23/2017   Bell's palsy 12/23/2017   Aortic atherosclerosis ([place]) 11/12/2017   Mixed hyperlipidemia 11/12/2017   Right knee pain 01/10/2017   Chest pain 05/31/2016   Postmenopausal estrogen deficiency 04/10/2015   Leg cramps 03/23/2015   Adjustment disorder with mixed anxiety and depressed mood 03/23/2015   Basal cell carcinoma 07/28/2014   Urge incontinence 10/25/2013   Overweight (BMI 25.0-29.9) 07/20/2013   Insomnia 07/20/2013   Hypertension 05/14/2011   Dyspnea 10/04/2010    Past Surgical  History:  Procedure Laterality Date   BREAST BIOPSY Bilateral    NEG   BREAST EXCISIONAL BIOPSY Bilateral    NEG   CARPAL TUNNEL RELEASE     right wrist   CHOLECYSTECTOMY     COLONOSCOPY N/A 01/31/2016   Procedure: COLONOSCOPY;  Surgeon: Hulen Luster, MD;  Location: [place];  Service: Gastroenterology;  Laterality: N/A;   EYE SURGERY     muscular as child/ CATARACT BILATERAL   SKIN CANCER EXCISION Right Wrist and thigh (x2)   Unsure of dates and Dermatologist   VESICOVAGINAL FISTULA CLOSURE W/ TAH      OB History   No obstetric history on file.      Home Medications    Prior to Admission medications   Medication Sig Start Date End Date Taking? Authorizing Provider  amLODipine (NORVASC) 5 MG tablet Take 1 tablet (5 mg total) by mouth daily. 02/21/20   Leone Haven, MD  amoxicillin (AMOXIL) 500 MG capsule  01/14/20   [provider]  aspirin (ASPIR-LOW) 81 MG EC tablet Take 81 mg by mouth daily.     [provider]  cephALEXin (KEFLEX) 500 MG capsule Take 1 capsule (500 mg total) by mouth 2 (two) times daily for 5 days. 04/07/20 04/12/20  Sharion Balloon, NP  diphenhydrAMINE (BENADRYL) 25 MG tablet Take 25 mg by mouth daily as  needed for itching, allergies or sleep.    [provider]  DULoxetine (CYMBALTA) 30 MG capsule Take 1 capsule (30 mg total) by mouth daily. 02/23/19   Kathrynn Ducking, MD  losartan (COZAAR) 25 MG tablet Take 1 tablet (25 mg total) by mouth daily. In am 10/29/19   Leone Haven, MD  Multiple Vitamins-Minerals (MULTIVITAMIN WITH MINERALS) tablet Take 1 tablet by mouth daily.     [provider]  Omega-3 Fatty Acids (OMEGA-3 FISH OIL PO) Take 1 capsule by mouth daily.     [provider]  omeprazole (PRILOSEC) 20 MG capsule Take 1 capsule (20 mg total) by mouth daily as needed. For acid reflux 10/25/19   Leone Haven, MD  oxybutynin (DITROPAN XL) 15 MG 24 hr tablet Take 1 tablet (15 mg total)  by mouth daily. 01/25/20   Hollice Espy, MD  pregabalin (LYRICA) 100 MG capsule Take 1 capsule (100 mg total) by mouth 2 (two) times daily. 01/03/20   Leone Haven, MD  rosuvastatin (CRESTOR) 10 MG tablet Take 1 tablet (10 mg total) by mouth daily. 10/29/19   Leone Haven, MD  solifenacin (VESICARE) 5 MG tablet solifenacin 5 mg tablet  TAKE 1 TABLET BY MOUTH ONCE DAILY    [provider]  traZODone (DESYREL) 150 MG tablet Take 1 tablet (150 mg total) by mouth at bedtime. 10/03/17   Leone Haven, MD  triazolam (HALCION) 0.25 MG tablet Take 0.5 mg by mouth at bedtime as needed. Patient not taking: Reported on 02/21/2020 01/14/20   [provider]    Family History Family History  Problem Relation Age of Onset   Ovarian cancer Mother    Prostate cancer Father    Coronary artery disease Father    Diabetes Father    Heart attack Father    Heart attack Brother    Breast cancer Neg Hx     Social History Social History   Tobacco Use   Smoking status: Former Smoker   Smokeless tobacco: Never Used   Tobacco comment: 1 cig every 3 months   Vaping Use   Vaping Use: Never used  Substance Use Topics   Alcohol use: Yes    Comment: 1 MIXED DRINK/WEEK   Drug use: No     Allergies   Lisinopril, Shellfish allergy, Sulfa antibiotics, and Sulfonamide derivatives   Review of Systems Review of Systems  Constitutional: Positive for chills. Negative for fever.  HENT: Negative for ear pain and sore throat.   Eyes: Negative for pain and visual disturbance.  Respiratory: Negative for cough and shortness of breath.   Cardiovascular: Negative for chest pain and palpitations.  Gastrointestinal: Negative for abdominal pain and vomiting.  Genitourinary: Positive for dysuria and frequency. Negative for hematuria.  Musculoskeletal: Negative for arthralgias and back pain.  Skin: Negative for color change and rash.  Neurological: Negative for seizures and  syncope.  All other systems reviewed and are negative.    Physical Exam Triage Vital Signs ED Triage Vitals  Enc Vitals Group     BP 04/07/20 1349 (!) 113/63     Pulse Rate 04/07/20 1349 90     Resp 04/07/20 1349 16     Temp 04/07/20 1349 99.9 F (37.7 C)     Temp src --      SpO2 04/07/20 1349 94 %     Weight --      Height --      Head Circumference --  Peak Flow --      Pain Score 04/07/20 1346 4     Pain Loc --      Pain Edu? --      Excl. in [place]? --    No data found.  Updated Vital Signs BP (!) 113/63    Pulse 90    Temp 99.9 F (37.7 C)    Resp 16    SpO2 94%   Visual Acuity Right Eye Distance:   Left Eye Distance:   Bilateral Distance:    Right Eye Near:   Left Eye Near:    Bilateral Near:     Physical Exam Vitals and nursing note reviewed.  Constitutional:      General: She is not in acute distress.    Appearance: She is well-developed. She is not ill-appearing.  HENT:     Head: Normocephalic and atraumatic.     Right Ear: Tympanic membrane normal.     Left Ear: Tympanic membrane normal.     Nose: Nose normal.     Mouth/Throat:     Mouth: Mucous membranes are moist.     Pharynx: Oropharynx is clear.  Eyes:     Conjunctiva/sclera: Conjunctivae normal.  Cardiovascular:     Rate and Rhythm: Normal rate and regular rhythm.     Heart sounds: No murmur heard.   Pulmonary:     Effort: Pulmonary effort is normal. No respiratory distress.     Breath sounds: Normal breath sounds.  Abdominal:     Palpations: Abdomen is soft.     Tenderness: There is no abdominal tenderness. There is no right CVA tenderness, left CVA tenderness, guarding or rebound.  Musculoskeletal:     Cervical back: Neck supple.  Skin:    General: Skin is warm and dry.     Findings: No rash.  Neurological:     General: No focal deficit present.     Mental Status: She is alert.     Gait: Gait normal.  Psychiatric:        Mood and Affect: Mood normal.        Behavior:  Behavior normal.      UC Treatments / Results  Labs (all labs ordered are listed, but only abnormal results are displayed) Labs Reviewed  POCT URINALYSIS DIP (MANUAL ENTRY) - Abnormal; Notable for the following components:      Result Value   Clarity, UA turbid (*)    Bilirubin, UA small (*)    Ketones, POC UA trace (5) (*)    Blood, UA moderate (*)    Protein Ur, POC >=300 (*)    Nitrite, UA Positive (*)    Leukocytes, UA Small (1+) (*)    All other components within normal limits  NOVEL CORONAVIRUS, NAA  URINE CULTURE    EKG   Radiology No results found.  Procedures Procedures (including critical care time)  Medications Ordered in UC Medications - No data to display  Initial Impression / Assessment and Plan / UC Course  I have reviewed the triage vital signs and the nursing notes.  Pertinent labs & imaging results that were available during my care of the patient were reviewed by me and considered in my medical decision making (see chart for details).   Urinary tract infection.  Urine culture pending.  Treating with Keflex.  Discussed with patient that we will call her if the urine culture shows the need to change or discontinue her antibiotic.  PCR COVID pending.  Instructed patient  to follow-up with her PCP if her symptoms are not improving.  Patient agrees to plan of care.   Final Clinical Impressions(s) / UC Diagnoses   Final diagnoses:  Urinary tract infection without hematuria, site unspecified     Discharge Instructions     Take the antibiotic as directed.  A urine culture is pending.  We will call you if it indicates the need to change or discontinue your antibiotic.    Your Covid test result is pending.    Follow-up with your primary care provider if your symptoms are not improving.        ED Prescriptions    Medication Sig Dispense Auth. Provider   cephALEXin (KEFLEX) 500 MG capsule Take 1 capsule (500 mg total) by mouth 2 (two) times daily  for 5 days. 10 capsule Sharion Balloon, NP     PDMP not reviewed this encounter.   Sharion Balloon, NP 04/07/20 [phone]

## 2020-04-07 NOTE — ED Triage Notes (Signed)
Patient reports she saw her PCP who sent her to UC to be tested for COVID. States she has chills and body aches.   Denies cough, ShOB, chest pain, runny/stuffy nose

## 2020-04-07 NOTE — Discharge Instructions (Signed)
Take the antibiotic as directed.  A urine culture is pending.  We will call you if it indicates the need to change or discontinue your antibiotic.    Your Covid test result is pending.    Follow-up with your primary care provider if your symptoms are not improving.

## 2020-04-08 LAB — SARS-COV-2, NAA 2 DAY TAT

## 2020-04-08 LAB — NOVEL CORONAVIRUS, NAA: SARS-CoV-2, NAA: NOT DETECTED

## 2020-04-09 LAB — URINE CULTURE
Culture: >=100000 — AB
MICRO NUMBER:: 10770297
SPECIMEN QUALITY:: ADEQUATE

## 2020-04-24 ENCOUNTER — Other Ambulatory Visit: Payer: Self-pay | Admitting: Neurology

## 2020-04-24 ENCOUNTER — Telehealth: Payer: Self-pay | Admitting: Family Medicine

## 2020-04-24 MED ORDER — DULOXETINE HCL 30 MG PO CPEP: 30.0000 mg | ORAL_CAPSULE | Freq: Every day | ORAL | 3 refills | Status: DC

## 2020-04-24 MED ORDER — ROSUVASTATIN CALCIUM 10 MG PO TABS: 10.0000 mg | ORAL_TABLET | Freq: Every day | ORAL | 1 refills | Status: DC

## 2020-04-24 NOTE — Telephone Encounter (Signed)
Pt needs rosuvastatin called into [facility] order pharmacy.   Cymbalta prescription needs to go to [facility] in [place].

## 2020-05-05 ENCOUNTER — Ambulatory Visit: Payer: PPO | Attending: Internal Medicine

## 2020-05-05 DIAGNOSIS — Z23 Encounter for immunization: Secondary | ICD-10-CM

## 2020-05-05 NOTE — Progress Notes (Signed)
   Covid-19 Vaccination Clinic  Name:  Monica Larson    MRN: [id number] DOB: [birth year]  05/05/2020  [name] was observed post Covid-19 immunization for 15 minutes without incident. She was provided with Vaccine Information Sheet and instruction to access the V-Safe system.   [name] was instructed to call 911 with any severe reactions post vaccine: Marland Kitchen Difficulty breathing  . Swelling of face and throat  . A fast heartbeat  . A bad rash all over body  . Dizziness and weakness      Covid-19 Vaccination Clinic  Name:  Monica Larson    MRN: [id number] DOB: 06-21-1946  05/05/2020  [name] was observed post Covid-19 immunization for 15 minutes without incident. She was provided with Vaccine Information Sheet and instruction to access the V-Safe system.   [name] was instructed to call 911 with any severe reactions post vaccine: Marland Kitchen Difficulty breathing  . Swelling of face and throat  . A fast heartbeat  . A bad rash all over body  . Dizziness and weakness

## 2020-06-07 ENCOUNTER — Telehealth: Payer: Self-pay | Admitting: Family Medicine

## 2020-06-07 DIAGNOSIS — I10 Essential (primary) hypertension: Secondary | ICD-10-CM

## 2020-06-07 MED ORDER — LOSARTAN POTASSIUM 25 MG PO TABS: 25.0000 mg | ORAL_TABLET | Freq: Every day | ORAL | 1 refills | Status: DC

## 2020-06-07 NOTE — Addendum Note (Signed)
Addended byElpidio Galea T on: 06/07/2020 12:39 PM   Modules accepted: Orders

## 2020-06-07 NOTE — Telephone Encounter (Signed)
Pt need a refill on losartan (COZAAR) 25 MG tablet sent to [facility]

## 2020-06-09 ENCOUNTER — Ambulatory Visit (INDEPENDENT_AMBULATORY_CARE_PROVIDER_SITE_OTHER): Payer: PPO | Admitting: Physician Assistant

## 2020-06-09 ENCOUNTER — Other Ambulatory Visit: Payer: Self-pay

## 2020-06-09 ENCOUNTER — Encounter: Payer: Self-pay | Admitting: Physician Assistant

## 2020-06-09 VITALS — BP 170/70 | HR 84 | Ht 67.0 in | Wt 190.0 lb

## 2020-06-09 DIAGNOSIS — N3582 Other urethral stricture, female: Secondary | ICD-10-CM

## 2020-06-09 DIAGNOSIS — N8111 Cystocele, midline: Secondary | ICD-10-CM | POA: Diagnosis not present

## 2020-06-09 DIAGNOSIS — N390 Urinary tract infection, site not specified: Secondary | ICD-10-CM | POA: Diagnosis not present

## 2020-06-09 DIAGNOSIS — K219 Gastro-esophageal reflux disease without esophagitis: Secondary | ICD-10-CM

## 2020-06-09 DIAGNOSIS — R300 Dysuria: Secondary | ICD-10-CM | POA: Diagnosis not present

## 2020-06-09 LAB — MICROSCOPIC EXAMINATION
Epithelial Cells (non renal): NONE SEEN /hpf (ref 0–10)
RBC, Urine: NONE SEEN /hpf (ref 0–2)
WBC, UA: >30 /hpf — ABNORMAL HIGH (ref 0–5)

## 2020-06-09 LAB — URINALYSIS, COMPLETE
Bilirubin, UA: NEGATIVE
Glucose, UA: NEGATIVE
Ketones, UA: NEGATIVE
Nitrite, UA: NEGATIVE
Specific Gravity, UA: 1.02 (ref 1.005–1.030)
Urobilinogen, Ur: 0.2 mg/dL (ref 0.2–1.0)
pH, UA: 5.5 (ref 5.0–7.5)

## 2020-06-09 LAB — BLADDER SCAN AMB NON-IMAGING: Scan Result: 54

## 2020-06-09 MED ORDER — OMEPRAZOLE 20 MG PO CPDR: 20.0000 mg | DELAYED_RELEASE_CAPSULE | Freq: Every day | ORAL | 1 refills | Status: DC | PRN

## 2020-06-09 MED ORDER — NITROFURANTOIN MONOHYD MACRO 100 MG PO CAPS: 100.0000 mg | ORAL_CAPSULE | Freq: Two times a day (BID) | ORAL | 0 refills | Status: AC

## 2020-06-09 NOTE — Progress Notes (Signed)
In and Out Catheterization  Patient is present today for a I & O catheterization due to urethral stricture. Patient was cleaned and prepped in a sterile fashion with betadine. A 14FR cath was inserted no complications were noted , 52ml of urine return was noted, urine was yellow in color. Bladder was drained and catheter was removed without difficulty.    Performed by: Debroah Loop, PA-C

## 2020-06-09 NOTE — Progress Notes (Signed)
06/09/2020 10:11 AM   Monica Larson 1946-07-17 [id number]  CC: Chief Complaint  Patient presents with  . Other   HPI: Monica Larson is a 74 y.o. female with PMH urethral stricture managed by [facility]IC as needed, OAB-wet on oxybutynin XL 15 mg daily, cystocele s/p anterior repair and sling in 2015, and recurrent UTI on topical vaginal estrogen cream who presents today for evaluation of dysuria.  Today she reports a 1 week history of vaginal pressure and throbbing pain with urination.  She believes that she is emptying her bladder well.  She attempted to [facility] 2 days ago and was unable to do so.  She believes she is using a 22 French catheter at home but states she has not self-cathed in quite some time.  She also notes constipation over the past week that has now resolved.  She states her current symptoms are inconsistent with past UTIs.  She states her typical UTI symptoms are burning pain with urination and worsened urinary leakage. Most recent positive urine culture dated 04/07/2020 with pansensitive E. coli.  She was treated with Keflex.  In-office UA today positive for 2+ blood, 1+ protein, and 2+ leukocyte esterase; urine microscopy with >30 WBCs/HPF and moderate bacteria. PVR 29mL.  PMH: Past Medical History:  Diagnosis Date  . Arthritis    HANDS/NECK/SHOULDERS  . Asthma    SEASONAL  . Diarrhea    INTERMITTENT CHRONIC  . Fatigue   . GERD (gastroesophageal reflux disease)   . H/O motion sickness 06/17/2016  . Headache    SINUS/ ALLERGIES  . HLD (hyperlipidemia)    borderline  . HTN (hypertension)    CONTROLLED ON MEDS  . Motion sickness   . Seasonal allergies   . Shortness of breath dyspnea    ON EXERTION    Surgical History: Past Surgical History:  Procedure Laterality Date  . BREAST BIOPSY Bilateral    NEG  . BREAST EXCISIONAL BIOPSY Bilateral    NEG  . CARPAL TUNNEL RELEASE     right wrist  . CHOLECYSTECTOMY    . COLONOSCOPY N/A 01/31/2016   Procedure: COLONOSCOPY;   Surgeon: Hulen Luster, MD;  Location: [facility];  Service: Gastroenterology;  Laterality: N/A;  . EYE SURGERY     muscular as child/ CATARACT BILATERAL  . SKIN CANCER EXCISION Right Wrist and thigh (x2)   Unsure of dates and Dermatologist  . VESICOVAGINAL FISTULA CLOSURE W/ TAH      Home Medications:  Allergies as of 06/09/2020      Reactions   Lisinopril    Cough   Shellfish Allergy    Sulfa Antibiotics    hives   Sulfonamide Derivatives       Medication List       Accurate as of June 09, 2020 10:11 AM. If you have any questions, ask your nurse or doctor.        amLODipine 5 MG tablet Commonly known as: NORVASC Take 1 tablet (5 mg total) by mouth daily.   amoxicillin 500 MG capsule Commonly known as: AMOXIL   Aspir-Low 81 MG EC tablet Generic drug: aspirin Take 81 mg by mouth daily.   diphenhydrAMINE 25 MG tablet Commonly known as: BENADRYL Take 25 mg by mouth daily as needed for itching, allergies or sleep.   DULoxetine 30 MG capsule Commonly known as: Cymbalta Take 1 capsule (30 mg total) by mouth daily.   losartan 25 MG tablet Commonly known as: Cozaar Take 1 tablet (25  mg total) by mouth daily. In am   multivitamin with minerals tablet Take 1 tablet by mouth daily.   nitrofurantoin (macrocrystal-monohydrate) 100 MG capsule Commonly known as: MACROBID Take 1 capsule (100 mg total) by mouth every 12 (twelve) hours for 5 days. Started by: Debroah Loop, PA-C   OMEGA-3 FISH OIL PO Take 1 capsule by mouth daily.   omeprazole 20 MG capsule Commonly known as: PRILOSEC Take 1 capsule (20 mg total) by mouth daily as needed. For acid reflux   oxybutynin 15 MG 24 hr tablet Commonly known as: DITROPAN XL Take 1 tablet (15 mg total) by mouth daily.   pregabalin 100 MG capsule Commonly known as: Lyrica Take 1 capsule (100 mg total) by mouth 2 (two) times daily.   rosuvastatin 10 MG tablet Commonly known as: CRESTOR Take 1 tablet (10 mg  total) by mouth daily.   solifenacin 5 MG tablet Commonly known as: VESICARE solifenacin 5 mg tablet  TAKE 1 TABLET BY MOUTH ONCE DAILY   traZODone 150 MG tablet Commonly known as: DESYREL Take 1 tablet (150 mg total) by mouth at bedtime.   triazolam 0.25 MG tablet Commonly known as: HALCION Take 0.5 mg by mouth at bedtime as needed.       Allergies:  Allergies  Allergen Reactions  . Lisinopril     Cough   . Shellfish Allergy   . Sulfa Antibiotics     hives  . Sulfonamide Derivatives     Family History: Family History  Problem Relation Age of Onset  . Ovarian cancer Mother   . Prostate cancer Father   . Coronary artery disease Father   . Diabetes Father   . Heart attack Father   . Heart attack Brother   . Breast cancer Neg Hx     Social History:   reports that she has quit smoking. She has never used smokeless tobacco. She reports current alcohol use. She reports that she does not use drugs.  Physical Exam: BP (!) 170/70   Pulse 84   Ht 5\' 7"  (1.702 m)   Wt 190 lb (86.2 kg)   BMI 29.76 kg/m   Constitutional:  Alert and oriented, no acute distress, nontoxic appearing HEENT: [place], AT Cardiovascular: No clubbing, cyanosis, or edema Respiratory: Normal respiratory effort, no increased work of breathing GU: Grade 2 anterior vaginal wall prolapse Skin: No rashes, bruises or suspicious lesions Neurologic: Grossly intact, no focal deficits, moving all 4 extremities Psychiatric: Normal mood and affect  Laboratory Data: Results for orders placed or performed in visit on 06/09/20  CULTURE, URINE COMPREHENSIVE   Specimen: Urine   UR  Result Value Ref Range   Urine Culture, Comprehensive Preliminary report (A)    Organism ID, Bacteria Gram negative rods (A)    Organism ID, Bacteria Comment   Microscopic Examination   Urine  Result Value Ref Range   WBC, UA >30 (H) 0 - 5 /hpf   RBC None seen 0 - 2 /hpf   Epithelial Cells (non renal) None seen 0 - 10 /hpf    Bacteria, UA Moderate (A) None seen/Few  Urinalysis, Complete  Result Value Ref Range   Specific Gravity, UA 1.020 1.005 - 1.030   pH, UA 5.5 5.0 - 7.5   Color, UA Yellow Yellow   Appearance Ur Cloudy (A) Clear   Leukocytes,UA 2+ (A) Negative   Protein,UA 1+ (A) Negative/Trace   Glucose, UA Negative Negative   Ketones, UA Negative Negative   RBC, UA 2+ (  A) Negative   Bilirubin, UA Negative Negative   Urobilinogen, Ur 0.2 0.2 - 1.0 mg/dL   Nitrite, UA Negative Negative   Microscopic Examination See below:   BLADDER SCAN AMB NON-IMAGING  Result Value Ref Range   Scan Result 54    Assessment & Plan:   1. Dysuria UA today notable for pyuria and bacteriuria.  Will start empiric Macrobid and send for culture for further evaluation. - Urinalysis, Complete - CULTURE, URINE COMPREHENSIVE - nitrofurantoin, macrocrystal-monohydrate, (MACROBID) 100 MG capsule; Take 1 capsule (100 mg total) by mouth every 12 (twelve) hours for 5 days.  Dispense: 10 capsule; Refill: 0  2. Other stricture of urethra in female 36 French in and out catheter passed into the bladder with minimal resistance.  Counseled patient to switch from 28 [place] to 14 [place] catheters for urethral sounding at home.  Samples provided today.  May consider urethral dilation in the future if she develops difficulty with passing 14 French catheters, has increased PVRs, or increased frequency of UTI. - BLADDER SCAN AMB NON-IMAGING  3. Cystocele, midline Recurrent s/p prior anterior repair with sling.  She does not wish to return to [facility].  I would like her to follow-up with Dr. Matilde Sprang to discuss management options.  Return in about 4 weeks (around 07/07/2020) for Cystocele evaluation with Dr. Matilde Sprang.  Debroah Loop, PA-C  [facility] [address], [place], [place] [address] [phone]

## 2020-06-09 NOTE — Patient Instructions (Signed)
START nitrofurantoin SWITCH to size 14 French catheters to keep your urethra open (I have provided you samples today; please contact your catheter supply company to adjust your standing order with them and contact our office if you need documentation from [place] to complete this) COME BACK to see Dr. Matilde Sprang and discuss possible surgical options for management of your bladder prolapse

## 2020-06-12 ENCOUNTER — Ambulatory Visit (INDEPENDENT_AMBULATORY_CARE_PROVIDER_SITE_OTHER): Payer: PPO | Admitting: Urology

## 2020-06-12 ENCOUNTER — Encounter: Payer: Self-pay | Admitting: Urology

## 2020-06-12 ENCOUNTER — Other Ambulatory Visit: Payer: Self-pay

## 2020-06-12 VITALS — BP 145/73 | HR 79 | Ht 67.0 in | Wt 193.6 lb

## 2020-06-12 DIAGNOSIS — N3946 Mixed incontinence: Secondary | ICD-10-CM

## 2020-06-12 NOTE — Progress Notes (Signed)
06/12/2020 9:15 AM   Monica Larson 1945/12/21 [id number]  Referring provider: Leone Haven, MD [address] [place],  [place] [address]  Chief Complaint  Patient presents with  . Other    HPI: Dr Erlene Quan: Patient has urethral stricture and perform self-catheterization once per month.  She has urge incontinence and has been on oxybutynin Toviaz imipramine and Vesicare.  She has had a hysterectomy.  In 2015 she had a cystocele repair and sling.  She is taking once a day oxybutynin 15 mg and has been given estrogen.  Last residual 0 mL.  A number of months ago she had a positive culture.  She came in with cystitis symptoms recently and had a negative culture and had to be catheterized with a 14 French catheter by nurse practitioner  The pain patient currently might leak a small amount with coughing sneezing and lifting heavy.  She can have urge incontinence if she drinks a lot of fluids.  She may have some dampness while she sleeping.  Some of the details were more difficult to ascertain.  She wears 3 pads a day that are damp and she tends to be quite fastidious  She voids every 2 or 3 hours and gets up once or twice a night.  Flow was reasonable.  She is to catheterize once or twice a week and then once a week but much less frequently now.  No blood in urine.  She feels some bulging in the vagina.  She has had a hysterectomy.  She does not reduce the cystocele.  She was having some nonspecific vaginal pain and this is why she felt the prolapse was worse.  She said that when she tried to void it was hard to go and it hurt in the vagina and then she had trouble catheterizing.  This symptom has improved some since.  She does report some vaginal dryness and is been using local estrogen cream twice a week for about 4 months    PMH: Past Medical History:  Diagnosis Date  . Arthritis    HANDS/NECK/SHOULDERS  . Asthma    SEASONAL  . Diarrhea    INTERMITTENT CHRONIC  .  Fatigue   . GERD (gastroesophageal reflux disease)   . H/O motion sickness 06/17/2016  . Headache    SINUS/ ALLERGIES  . HLD (hyperlipidemia)    borderline  . HTN (hypertension)    CONTROLLED ON MEDS  . Motion sickness   . Seasonal allergies   . Shortness of breath dyspnea    ON EXERTION    Surgical History: Past Surgical History:  Procedure Laterality Date  . BREAST BIOPSY Bilateral    NEG  . BREAST EXCISIONAL BIOPSY Bilateral    NEG  . CARPAL TUNNEL RELEASE     right wrist  . CHOLECYSTECTOMY    . COLONOSCOPY N/A 01/31/2016   Procedure: COLONOSCOPY;  Surgeon: Hulen Luster, MD;  Location: [facility];  Service: Gastroenterology;  Laterality: N/A;  . EYE SURGERY     muscular as child/ CATARACT BILATERAL  . SKIN CANCER EXCISION Right Wrist and thigh (x2)   Unsure of dates and Dermatologist  . VESICOVAGINAL FISTULA CLOSURE W/ TAH      Home Medications:  Allergies as of 06/12/2020      Reactions   Lisinopril    Cough   Shellfish Allergy    Sulfa Antibiotics    hives   Sulfonamide Derivatives       Medication List  Accurate as of June 12, 2020  9:15 AM. If you have any questions, ask your nurse or doctor.        amLODipine 5 MG tablet Commonly known as: NORVASC Take 1 tablet (5 mg total) by mouth daily.   amoxicillin 500 MG capsule Commonly known as: AMOXIL   Aspir-Low 81 MG EC tablet Generic drug: aspirin Take 81 mg by mouth daily.   diphenhydrAMINE 25 MG tablet Commonly known as: BENADRYL Take 25 mg by mouth daily as needed for itching, allergies or sleep.   DULoxetine 30 MG capsule Commonly known as: Cymbalta Take 1 capsule (30 mg total) by mouth daily.   losartan 25 MG tablet Commonly known as: Cozaar Take 1 tablet (25 mg total) by mouth daily. In am   multivitamin with minerals tablet Take 1 tablet by mouth daily.   nitrofurantoin (macrocrystal-monohydrate) 100 MG capsule Commonly known as: MACROBID Take 1 capsule (100 mg total)  by mouth every 12 (twelve) hours for 5 days.   OMEGA-3 FISH OIL PO Take 1 capsule by mouth daily.   omeprazole 20 MG capsule Commonly known as: PRILOSEC Take 1 capsule (20 mg total) by mouth daily as needed. For acid reflux   oxybutynin 15 MG 24 hr tablet Commonly known as: DITROPAN XL Take 1 tablet (15 mg total) by mouth daily.   pregabalin 100 MG capsule Commonly known as: Lyrica Take 1 capsule (100 mg total) by mouth 2 (two) times daily.   rosuvastatin 10 MG tablet Commonly known as: CRESTOR Take 1 tablet (10 mg total) by mouth daily.   solifenacin 5 MG tablet Commonly known as: VESICARE solifenacin 5 mg tablet  TAKE 1 TABLET BY MOUTH ONCE DAILY   traZODone 150 MG tablet Commonly known as: DESYREL Take 1 tablet (150 mg total) by mouth at bedtime.   triazolam 0.25 MG tablet Commonly known as: HALCION Take 0.5 mg by mouth at bedtime as needed.       Allergies:  Allergies  Allergen Reactions  . Lisinopril     Cough   . Shellfish Allergy   . Sulfa Antibiotics     hives  . Sulfonamide Derivatives     Family History: Family History  Problem Relation Age of Onset  . Ovarian cancer Mother   . Prostate cancer Father   . Coronary artery disease Father   . Diabetes Father   . Heart attack Father   . Heart attack Brother   . Breast cancer Neg Hx     Social History:  reports that she has quit smoking. She has never used smokeless tobacco. She reports current alcohol use. She reports that she does not use drugs.  ROS:                                        Physical Exam: BP (!) 145/73 (BP Location: Left Arm, Patient Position: Sitting, Cuff Size: Normal)   Pulse 79   Ht 5\' 7"  (1.702 m)   Wt 87.8 kg   BMI 30.32 kg/m   Constitutional:  Alert and oriented, No acute distress. HEENT: [place] AT, moist mucus membranes.  Trachea midline, no masses. Cardiovascular: No clubbing, cyanosis, or edema. Respiratory: Normal respiratory effort, no  increased work of breathing. GI: Abdomen is soft, nontender, nondistended, no abdominal masses GU: On pelvic examination patient had a grade 2 cystocele with moderate central defect that would descend a few  centimeters and almost hinging over a fairly fixed urethra.  Her vaginal cuff descended from 8 or 9 cm to approximately 7 cm.  I had her bear down multiple times.  Minimal hypermobility the bladder neck and no stress incontinence.  Very small to no grade 1 rectocele.  Tissues were a bit inflamed Skin: No rashes, bruises or suspicious lesions. Lymph: No cervical or inguinal adenopathy. Neurologic: Grossly intact, no focal deficits, moving all 4 extremities. Psychiatric: Normal mood and affect.  Laboratory Data: Lab Results  Component Value Date   WBC 4.2 11/04/2019   HGB 14.3 11/04/2019   HCT 43.0 11/04/2019   MCV 95.2 11/04/2019   PLT 167.0 11/04/2019    Lab Results  Component Value Date   CREATININE 0.73 11/04/2019    No results found for: PSA  No results found for: TESTOSTERONE  Lab Results  Component Value Date   HGBA1C 5.8 11/04/2019    Urinalysis    Component Value Date/Time   COLORURINE AMBER (A) 08/19/2019 1212   APPEARANCEUR Cloudy (A) 06/09/2020 0852   LABSPEC 1.015 08/19/2019 1212   PHURINE  08/19/2019 1212    TEST NOT REPORTED DUE TO COLOR INTERFERENCE OF URINE PIGMENT   GLUCOSEU Negative 06/09/2020 0852   GLUCOSEU NEGATIVE 05/04/2014 1113   HGBUR (A) 08/19/2019 1212    TEST NOT REPORTED DUE TO COLOR INTERFERENCE OF URINE PIGMENT   BILIRUBINUR Negative 06/09/2020 0852   KETONESUR trace (5) (A) 04/07/2020 1428   KETONESUR (A) 08/19/2019 1212    TEST NOT REPORTED DUE TO COLOR INTERFERENCE OF URINE PIGMENT   PROTEINUR 1+ (A) 06/09/2020 0852   PROTEINUR (A) 08/19/2019 1212    TEST NOT REPORTED DUE TO COLOR INTERFERENCE OF URINE PIGMENT   UROBILINOGEN 0.2 04/07/2020 1428   UROBILINOGEN 0.2 05/04/2014 1113   NITRITE Negative 06/09/2020 0852   NITRITE (A)  08/19/2019 1212    TEST NOT REPORTED DUE TO COLOR INTERFERENCE OF URINE PIGMENT   LEUKOCYTESUR 2+ (A) 06/09/2020 0852   LEUKOCYTESUR (A) 08/19/2019 1212    TEST NOT REPORTED DUE TO COLOR INTERFERENCE OF URINE PIGMENT    Pertinent Imaging:   Assessment & Plan: Patient has a complicated history of recurrent urethral stricture.  She has had a sling and cystocele repair.  She has prolapse symptoms.  She has mild mixed incontinence.  She has mild frequency and nocturia.  A picture was drawn.  The role of urodynamics and future cystoscopy discussed.  I can see why she might feel the cystocele but treatment of local vaginal dryness may make it much less symptomatic and or take away any vaginal discomfort or sensation.  If she ever had surgery she would need to be consented for a transvaginal vault suspension and cystocele repair and graft recognizing she only would probably have an anterior repair.  The recurrence rate based on local findings may be a little bit higher.  Patient understands that any infections or trouble to physically catheterize are not due to cystocele.  She empties well.  I can see how her anatomy could affect her voiding with the potential hinge effect.  She understands though that a lot of her symptoms may not be helped by repeat prolapse surgery.  I decided not to discuss the role of urodynamics at this stage.  She will increase estrogen to 3 and even 4 times a week and in about 5 weeks I will perform a safety cystoscopy  I educated her that her sling may have helped her 5 or  6 years ago and she was hoping that it could be removed or that something could be done because it was not done right in the first place.  I think she understood my counseling.  I think it is important that she has reasonable goals on what she is consenting to if she ever has repeat surgery.  There are no diagnoses linked to this encounter.  No follow-ups on file.  Reece Packer, MD  [facility] [address], [place], [place] [address] [phone]

## 2020-06-12 NOTE — Patient Instructions (Signed)

## 2020-06-13 DIAGNOSIS — M1711 Unilateral primary osteoarthritis, right knee: Secondary | ICD-10-CM | POA: Diagnosis not present

## 2020-06-13 LAB — CULTURE, URINE COMPREHENSIVE

## 2020-06-23 DIAGNOSIS — M5459 Other low back pain: Secondary | ICD-10-CM | POA: Diagnosis not present

## 2020-06-23 DIAGNOSIS — M5136 Other intervertebral disc degeneration, lumbar region: Secondary | ICD-10-CM | POA: Diagnosis not present

## 2020-06-28 ENCOUNTER — Ambulatory Visit: Payer: PPO | Admitting: Family Medicine

## 2020-07-03 ENCOUNTER — Encounter: Payer: Self-pay | Admitting: Family Medicine

## 2020-07-03 ENCOUNTER — Other Ambulatory Visit: Payer: Self-pay

## 2020-07-03 ENCOUNTER — Ambulatory Visit (INDEPENDENT_AMBULATORY_CARE_PROVIDER_SITE_OTHER): Payer: PPO | Admitting: Family Medicine

## 2020-07-03 DIAGNOSIS — H539 Unspecified visual disturbance: Secondary | ICD-10-CM

## 2020-07-03 DIAGNOSIS — I10 Essential (primary) hypertension: Secondary | ICD-10-CM | POA: Diagnosis not present

## 2020-07-03 DIAGNOSIS — N3941 Urge incontinence: Secondary | ICD-10-CM | POA: Diagnosis not present

## 2020-07-03 DIAGNOSIS — G629 Polyneuropathy, unspecified: Secondary | ICD-10-CM

## 2020-07-03 MED ORDER — AMLODIPINE BESYLATE 5 MG PO TABS: 5.0000 mg | ORAL_TABLET | Freq: Every day | ORAL | 3 refills | Status: DC

## 2020-07-03 MED ORDER — DULOXETINE HCL 30 MG PO CPEP: 30.0000 mg | ORAL_CAPSULE | Freq: Every day | ORAL | 3 refills | Status: DC

## 2020-07-03 MED ORDER — LOSARTAN POTASSIUM 50 MG PO TABS: 50.0000 mg | ORAL_TABLET | Freq: Every day | ORAL | 1 refills | Status: DC

## 2020-07-03 NOTE — Patient Instructions (Signed)
Please decrease your amlodipine to 5 mg once daily. We will increase your losartan to 50 mg once daily. We will have you return in 1 to 2 weeks for labs and a blood pressure check. Please see your eye doctor as planned.

## 2020-07-03 NOTE — Assessment & Plan Note (Signed)
Chronic.  Stable.  Continue Cymbalta 30 mg once daily and Lyrica 100 mg twice daily

## 2020-07-03 NOTE — Assessment & Plan Note (Signed)
The patient will see her eye doctor later today.  Discussed that they should be able to look at the back of her eyes to see if there are any changes consistent with blood pressure issues.  We will try to get her blood pressure under better control.

## 2020-07-03 NOTE — Assessment & Plan Note (Signed)
She will continue to follow with urology. 

## 2020-07-03 NOTE — Progress Notes (Signed)
[name], MD Phone: [phone]  Monica Larson is a 74 y.o. female who presents today for f/u.  HYPERTENSION  Disease Monitoring  Home BP Monitoring 034V systolic Chest pain- no    Dyspnea- no Medications  Compliance-  Taking amlodipine, losartan.   Edema- yes if she does not drink enough water, resolves over night  Neuropathy: Patient has been on Cymbalta and Lyrica for this.  This does quite a good job at controlling her neuropathy.  Does have some tingling and feels as though her feet are cold.  Notes no depression or anxiety.  Vision changes: Patient notes she has an appointment with ophthalmology today to check her vision.  She feels as though her distance vision has been off for some time and that her glasses are not strong enough.  She wonders if her blood pressure is playing a role.  Urinary incontinence: Patient saw a new urologist.  They discussed completing a cystoscopy to evaluate her sling.    Social History   Tobacco Use  Smoking Status Former Smoker  Smokeless Tobacco Never Used  Tobacco Comment   1 cig every 3 months      ROS see history of present illness  Objective  Physical Exam Vitals:   07/03/20 1015  BP: (!) 150/70  Pulse: 70  Temp: 98.5 F (36.9 C)  SpO2: 97%    BP Readings from Last 3 Encounters:  07/03/20 (!) 150/70  06/12/20 (!) 145/73  06/09/20 (!) 170/70   Wt Readings from Last 3 Encounters:  07/03/20 190 lb 9.6 oz (86.5 kg)  06/12/20 193 lb 9.6 oz (87.8 kg)  06/09/20 190 lb (86.2 kg)    Physical Exam Constitutional:      General: She is not in acute distress.    Appearance: She is not diaphoretic.  Cardiovascular:     Rate and Rhythm: Normal rate and regular rhythm.     Heart sounds: Normal heart sounds.  Pulmonary:     Effort: Pulmonary effort is normal.     Breath sounds: Normal breath sounds.  Skin:    General: Skin is warm and dry.  Neurological:     Mental Status: She is alert.      Assessment/Plan:  Please see individual problem list.  Problem List Items Addressed This Visit    Hypertension    Uncontrolled.  She does have some swelling and so we will decrease her amlodipine to 5 mg once daily.  We will increase her losartan to 50 mg once daily.  She will return in 1 to 2 weeks for labs and a nurse BP check.  Discussed the potential for increasing the losartan further.      Relevant Medications   amLODipine (NORVASC) 5 MG tablet   losartan (COZAAR) 50 MG tablet   Neuropathy    Chronic.  Stable.  Continue Cymbalta 30 mg once daily and Lyrica 100 mg twice daily      Urge incontinence    She will continue to follow with urology.      Vision changes    The patient will see her eye doctor later today.  Discussed that they should be able to look at the back of her eyes to see if there are any changes consistent with blood pressure issues.  We will try to get her blood pressure under better control.       Other Visit Diagnoses    Essential hypertension       Relevant Medications   amLODipine (NORVASC)  5 MG tablet   losartan (COZAAR) 50 MG tablet   Other Relevant Orders   Basic Metabolic Panel (BMET)       This visit occurred during the SARS-CoV-2 public health emergency.  Safety protocols were in place, including screening questions prior to the visit, additional usage of staff PPE, and extensive cleaning of exam room while observing appropriate contact time as indicated for disinfecting solutions.    [name], MD [place]

## 2020-07-03 NOTE — Assessment & Plan Note (Signed)
Uncontrolled.  She does have some swelling and so we will decrease her amlodipine to 5 mg once daily.  We will increase her losartan to 50 mg once daily.  She will return in 1 to 2 weeks for labs and a nurse BP check.  Discussed the potential for increasing the losartan further.

## 2020-07-11 ENCOUNTER — Other Ambulatory Visit: Payer: PPO

## 2020-07-13 ENCOUNTER — Other Ambulatory Visit: Payer: Self-pay

## 2020-07-13 ENCOUNTER — Ambulatory Visit (INDEPENDENT_AMBULATORY_CARE_PROVIDER_SITE_OTHER): Payer: PPO

## 2020-07-13 ENCOUNTER — Other Ambulatory Visit (INDEPENDENT_AMBULATORY_CARE_PROVIDER_SITE_OTHER): Payer: PPO

## 2020-07-13 VITALS — BP 156/61 | HR 69

## 2020-07-13 DIAGNOSIS — I10 Essential (primary) hypertension: Secondary | ICD-10-CM

## 2020-07-13 LAB — BASIC METABOLIC PANEL
BUN: 12 mg/dL (ref 6–23)
CO2: 28 mEq/L (ref 19–32)
Calcium: 8.7 mg/dL (ref 8.4–10.5)
Chloride: 104 mEq/L (ref 96–112)
Creatinine, Ser: 0.82 mg/dL (ref 0.40–1.20)
GFR: 70.03 mL/min (ref >60.00)
Glucose, Bld: 120 mg/dL — ABNORMAL HIGH (ref 70–99)
Potassium: 4 mEq/L (ref 3.5–5.1)
Sodium: 139 mEq/L (ref 135–145)

## 2020-07-13 NOTE — Progress Notes (Signed)
Patient is here for a BP check due to bp being high at last visit, as per patient.  Currently patients BP is 156/61 and BPM is 69.  Patient has no complaints of headaches, blurry vision, chest pain, arm pain, light headedness, dizziness, and nor jaw pain. Please see previous note for order.

## 2020-07-17 ENCOUNTER — Other Ambulatory Visit: Payer: Self-pay

## 2020-07-17 ENCOUNTER — Ambulatory Visit (INDEPENDENT_AMBULATORY_CARE_PROVIDER_SITE_OTHER): Payer: PPO | Admitting: Urology

## 2020-07-17 ENCOUNTER — Encounter: Payer: Self-pay | Admitting: Urology

## 2020-07-17 VITALS — BP 163/76 | HR 80

## 2020-07-17 DIAGNOSIS — R319 Hematuria, unspecified: Secondary | ICD-10-CM | POA: Diagnosis not present

## 2020-07-17 DIAGNOSIS — N3946 Mixed incontinence: Secondary | ICD-10-CM

## 2020-07-17 NOTE — Progress Notes (Addendum)
07/17/2020 9:18 AM   Monica Larson Sep 18, 1945 [id number]  Referring provider: Leone Haven, MD [address] [place],  [place] [address]  No chief complaint on file.   HPI: Dr Erlene Quan: Patient has urethral stricture and perform self-catheterization once per month.  She has urge incontinence and has been on oxybutynin Toviaz imipramine and Vesicare.  She has had a hysterectomy.  In 2015 she had a cystocele repair and sling.  She is taking once a day oxybutynin 15 mg and has been given estrogen.  Last residual 0 mL.  A number of months ago she had a positive culture.  She came in with cystitis symptoms recently and had a negative culture and had to be catheterized with a 14 French catheter by nurse practitioner  The patient currently might leak a small amount with coughing sneezing and lifting heavy.  She can have urge incontinence if she drinks a lot of fluids.  She may have some dampness while she sleeping.  Some of the details were more difficult to ascertain.  She wears 3 pads a day that are damp and she tends to be quite fastidious  She voids every 2 or 3 hours and gets up once or twice a night.  Flow was reasonable.  She is to catheterize once or twice a week and then once a week but much less frequently now.  No blood in urine.  She feels some bulging in the vagina.  She has had a hysterectomy.  She does not reduce the cystocele.  She was having some nonspecific vaginal pain and this is why she felt the prolapse was worse.  She said that when she tried to void it was hard to go and it hurt in the vagina and then she had trouble catheterizing.  This symptom has improved some since.  She does report some vaginal dryness and is been using local estrogen cream twice a week for about 4 months  On pelvic examination patient had a grade 2 cystocele with moderate central defect that would descend a few centimeters and almost hinging over a fairly fixed urethra.  Her vaginal  cuff descended from 8 or 9 cm to approximately 7 cm.  I had her bear down multiple times.  Minimal hypermobility the bladder neck and no stress incontinence.  Very small to no grade 1 rectocele.  Tissues were a bit inflamed  Patient has a complicated history of recurrent urethral stricture.  She has had a sling and cystocele repair.  She has prolapse symptoms.  She has mild mixed incontinence.  She has mild frequency and nocturia.  A picture was drawn.  The role of urodynamics and future cystoscopy discussed.  I can see why she might feel the cystocele but treatment of local vaginal dryness may make it much less symptomatic and or take away any vaginal discomfort or sensation.  If she ever had surgery she would need to be consented for a transvaginal vault suspension and cystocele repair and graft recognizing she only would probably have an anterior repair.  The recurrence rate based on local findings may be a little bit higher.  Patient understands that any infections or trouble to physically catheterize are not due to cystocele.  She empties well.  I can see how her anatomy could affect her voiding with the potential hinge effect.  She understands though that a lot of her symptoms may not be helped by repeat prolapse surgery.  I decided not to discuss the  role of urodynamics at this stage.  She will increase estrogen to 3 and even 4 times a week and in about 5 weeks I will perform a safety cystoscopy  I educated her that her sling may have helped her 5 or 6 years ago and she was hoping that it could be removed or that something could be done because it was not done right in the first place.  I think she understood my counseling.  I think it is important that she has reasonable goals on what she is consenting to if she ever has repeat surgery.  Today The patient said she is most bothered by her incontinence.  She can leak with coughing sneezing.  When she time voids every 2 hours at home she does quite  well.  I think she may get a little bit urge incontinence.  She said since she is use the estrogen cream multiple times a week feeling any bulging is dramatically better.  She feels more comfortable with less discomfort.  Frequency stable.  Clinically not infected  On repeat examination her tissues were no longer inflamed.  There is no question she is a high grade 2 cystocele with almost a trapdoor effect.  I could see why she could have some flow symptoms with hinging over the urethra.  Cystoscopy: Patient underwent flexible cystoscopy.  Bladder mucosa and trigone were normal.  No cystitis.  No carcinoma.  No mesh in bladder or urethra  Picture was drawn.  Watchful waiting for prolapse recommended and I do believe a lot of her symptoms were due to dryness.  Tapering program over time described.  Role of urodynamics for incontinence discussed.  A bulking agent for stress incontinence would likely be the best option especially with her retention history.  She says she infrequently catheterizes now and it is much reduced.  I think she is at high risk of retention by repeating the sling   PMH: Past Medical History:  Diagnosis Date  . Arthritis    HANDS/NECK/SHOULDERS  . Asthma    SEASONAL  . Diarrhea    INTERMITTENT CHRONIC  . Fatigue   . GERD (gastroesophageal reflux disease)   . H/O motion sickness 06/17/2016  . Headache    SINUS/ ALLERGIES  . HLD (hyperlipidemia)    borderline  . HTN (hypertension)    CONTROLLED ON MEDS  . Motion sickness   . Seasonal allergies   . Shortness of breath dyspnea    ON EXERTION    Surgical History: Past Surgical History:  Procedure Laterality Date  . BREAST BIOPSY Bilateral    NEG  . BREAST EXCISIONAL BIOPSY Bilateral    NEG  . CARPAL TUNNEL RELEASE     right wrist  . CHOLECYSTECTOMY    . COLONOSCOPY N/A 01/31/2016   Procedure: COLONOSCOPY;  Surgeon: Hulen Luster, MD;  Location: [facility];  Service: Gastroenterology;  Laterality: N/A;  .  EYE SURGERY     muscular as child/ CATARACT BILATERAL  . SKIN CANCER EXCISION Right Wrist and thigh (x2)   Unsure of dates and Dermatologist  . VESICOVAGINAL FISTULA CLOSURE W/ TAH      Home Medications:  Allergies as of 07/17/2020      Reactions   Lisinopril    Cough   Shellfish Allergy    Sulfa Antibiotics    hives   Sulfonamide Derivatives       Medication List       Accurate as of July 17, 2020  9:18  AM. If you have any questions, ask your nurse or doctor.        amLODipine 5 MG tablet Commonly known as: NORVASC Take 1 tablet (5 mg total) by mouth daily.   amoxicillin 500 MG capsule Commonly known as: AMOXIL   Aspir-Low 81 MG EC tablet Generic drug: aspirin Take 81 mg by mouth daily.   diphenhydrAMINE 25 MG tablet Commonly known as: BENADRYL Take 25 mg by mouth daily as needed for itching, allergies or sleep.   DULoxetine 30 MG capsule Commonly known as: Cymbalta Take 1 capsule (30 mg total) by mouth daily.   losartan 50 MG tablet Commonly known as: Cozaar Take 1 tablet (50 mg total) by mouth daily. In am   multivitamin with minerals tablet Take 1 tablet by mouth daily.   nitrofurantoin (macrocrystal-monohydrate) 100 MG capsule Commonly known as: MACROBID nitrofurantoin monohydrate/macrocrystals 100 mg capsule   OMEGA-3 FISH OIL PO Take 1 capsule by mouth daily.   omeprazole 20 MG capsule Commonly known as: PRILOSEC Take 1 capsule (20 mg total) by mouth daily as needed. For acid reflux   oxybutynin 15 MG 24 hr tablet Commonly known as: DITROPAN XL Take 1 tablet (15 mg total) by mouth daily.   predniSONE 5 MG tablet Commonly known as: DELTASONE prednisone 5 mg tablets in a dose pack  Take 1 dose pk by oral route as directed for 6 days.   pregabalin 100 MG capsule Commonly known as: Lyrica Take 1 capsule (100 mg total) by mouth 2 (two) times daily.   rosuvastatin 10 MG tablet Commonly known as: CRESTOR Take 1 tablet (10 mg total) by  mouth daily.   solifenacin 5 MG tablet Commonly known as: VESICARE solifenacin 5 mg tablet  TAKE 1 TABLET BY MOUTH ONCE DAILY   traZODone 150 MG tablet Commonly known as: DESYREL Take 1 tablet (150 mg total) by mouth at bedtime.   triazolam 0.25 MG tablet Commonly known as: HALCION Take 0.5 mg by mouth at bedtime as needed.       Allergies:  Allergies  Allergen Reactions  . Lisinopril     Cough   . Shellfish Allergy   . Sulfa Antibiotics     hives  . Sulfonamide Derivatives     Family History: Family History  Problem Relation Age of Onset  . Ovarian cancer Mother   . Prostate cancer Father   . Coronary artery disease Father   . Diabetes Father   . Heart attack Father   . Heart attack Brother   . Breast cancer Neg Hx     Social History:  reports that she has quit smoking. She has never used smokeless tobacco. She reports current alcohol use. She reports that she does not use drugs.  ROS:                                        Physical Exam: There were no vitals taken for this visit.  Constitutional:  Alert and oriented, No acute distress.  Laboratory Data: Lab Results  Component Value Date   WBC 4.2 11/04/2019   HGB 14.3 11/04/2019   HCT 43.0 11/04/2019   MCV 95.2 11/04/2019   PLT 167.0 11/04/2019    Lab Results  Component Value Date   CREATININE 0.82 07/13/2020    No results found for: PSA  No results found for: TESTOSTERONE  Lab Results  Component Value Date  HGBA1C 5.8 11/04/2019    Urinalysis    Component Value Date/Time   COLORURINE AMBER (A) 08/19/2019 1212   APPEARANCEUR Cloudy (A) 06/09/2020 0852   LABSPEC 1.015 08/19/2019 1212   PHURINE  08/19/2019 1212    TEST NOT REPORTED DUE TO COLOR INTERFERENCE OF URINE PIGMENT   GLUCOSEU Negative 06/09/2020 0852   GLUCOSEU NEGATIVE 05/04/2014 1113   HGBUR (A) 08/19/2019 1212    TEST NOT REPORTED DUE TO COLOR INTERFERENCE OF URINE PIGMENT   BILIRUBINUR  Negative 06/09/2020 0852   KETONESUR trace (5) (A) 04/07/2020 1428   KETONESUR (A) 08/19/2019 1212    TEST NOT REPORTED DUE TO COLOR INTERFERENCE OF URINE PIGMENT   PROTEINUR 1+ (A) 06/09/2020 0852   PROTEINUR (A) 08/19/2019 1212    TEST NOT REPORTED DUE TO COLOR INTERFERENCE OF URINE PIGMENT   UROBILINOGEN 0.2 04/07/2020 1428   UROBILINOGEN 0.2 05/04/2014 1113   NITRITE Negative 06/09/2020 0852   NITRITE (A) 08/19/2019 1212    TEST NOT REPORTED DUE TO COLOR INTERFERENCE OF URINE PIGMENT   LEUKOCYTESUR 2+ (A) 06/09/2020 0852   LEUKOCYTESUR (A) 08/19/2019 1212    TEST NOT REPORTED DUE TO COLOR INTERFERENCE OF URINE PIGMENT    Pertinent Imaging:   Assessment & Plan: Patient does not want urodynamics.  She will stay on oxybutynin 1-3 times a day and ice it twice a day.  I will check on her in 3 months.  She understands that I could not do a bulking agent without urodynamics.  I find that sometimes it might be difficult to reach her goals but I think she understood very well the issues.  There are no safety concerns and she is feeling much more comfortable.    1. Hematuria, unspecified type  - Urinalysis, Complete   No follow-ups on file.  Reece Packer, MD  [place] [facility] [address], [place], [place] [address] [phone]

## 2020-07-18 ENCOUNTER — Telehealth: Payer: Self-pay | Admitting: Family Medicine

## 2020-07-18 NOTE — Telephone Encounter (Signed)
Patient called in about her lab result

## 2020-07-18 NOTE — Telephone Encounter (Signed)
Spoke with patient about her lab results. While I was on the phone with patient she states he BP has been running high and wants to know if she needs to increase her medication. She states she has had readings with BP being in 170s and 160s.

## 2020-07-19 NOTE — Telephone Encounter (Signed)
Can you find out what her diastolic BP has been? It was 61 on her nurse BP check which limits the ability to increase her medication. If it has been higher than than we could consider increasing the losartan to 100 mg daily and she would have to have labs completed 7-10 days after increasing the dose. Thanks.

## 2020-07-19 NOTE — Telephone Encounter (Signed)
Patient states she doesn't have her exact readings with her because she just got to the beach today. But she said her diastolic readings were in the 60s and 70s. She did increase her amlodipine on her own to 10 mg and her BP reading this morning was 149/69. She will be back from the beach in 4 days so she would be able to do labs if you still want those done.

## 2020-07-19 NOTE — Telephone Encounter (Signed)
LMTCB

## 2020-07-20 NOTE — Telephone Encounter (Signed)
Noted. Please if she has had any swelling with the increase in the amlodipine. Her BP is better and may improve more over the next week on the higher dose of amlodipine. Can we get her set up for a nurse BP check in one week?

## 2020-07-20 NOTE — Telephone Encounter (Signed)
Spoke with patient about below and scheduled NV on 07/27/20 to check BP

## 2020-07-27 ENCOUNTER — Ambulatory Visit (INDEPENDENT_AMBULATORY_CARE_PROVIDER_SITE_OTHER): Payer: PPO

## 2020-07-27 ENCOUNTER — Other Ambulatory Visit: Payer: Self-pay

## 2020-07-27 VITALS — BP 149/73 | HR 71

## 2020-07-27 DIAGNOSIS — I10 Essential (primary) hypertension: Secondary | ICD-10-CM | POA: Diagnosis not present

## 2020-07-27 NOTE — Progress Notes (Signed)
Patient is here for a BP check due to bp being high at last visit, as per patient.  Currently patients BP is 149/73 and BPM is 71.  Patient has no complaints of headaches, blurry vision, chest pain, arm pain, light headedness, dizziness, and nor jaw pain. Please see previous note for order.   Patient has been taking 10mg  of her amlodipine for the sx.

## 2020-07-28 ENCOUNTER — Other Ambulatory Visit: Payer: Self-pay | Admitting: Family Medicine

## 2020-07-31 ENCOUNTER — Telehealth: Payer: Self-pay | Admitting: Internal Medicine

## 2020-07-31 DIAGNOSIS — I10 Essential (primary) hypertension: Secondary | ICD-10-CM

## 2020-07-31 NOTE — Telephone Encounter (Signed)
BP slightly elevated 07/27/20 BP check see below  Rx norvasc 5 confirm dose pt taking 10 mg ? Also is she taking losartan 50 mg daily?   If doing norvasc 10 mg and losartan 50 mg and BP as below  Is she agreeable increase losartan to 100 mg daily ?   Nurse visit BP check  Patient is here for a BP check due to bp being high at last visit, as per patient.  Currently patients BP is 149/73 and BPM is 71.  Patient has no complaints of headaches, blurry vision, chest pain, arm pain, light headedness, dizziness, and nor jaw pain. Please see previous note for order.   Patient has been taking 10mg  of her amlodipine for the sx.  Dr. Olivia Mackie McLean-Scocuzza

## 2020-08-07 NOTE — Telephone Encounter (Signed)
I called to speak with the patient and she stated she was not home and did not know her dosage of medication and asked me to call her back tomorrow.  Parmvir Boomer,cma

## 2020-08-07 NOTE — Telephone Encounter (Signed)
Please followup on the message below.

## 2020-08-10 MED ORDER — LOSARTAN POTASSIUM 100 MG PO TABS: 100.0000 mg | ORAL_TABLET | Freq: Every day | ORAL | 1 refills | Status: DC

## 2020-08-10 NOTE — Addendum Note (Signed)
Addended by: Leone Haven on: 08/10/2020 07:25 PM   Modules accepted: Orders

## 2020-08-10 NOTE — Telephone Encounter (Signed)
I reached the patient and asked her about her dosage of the Norvasc and the Losartan. Patient stated she is taking Norvasc 10 mg and Losartan 50 Mg.  I informed her that the provider wanted her to take 10 mg of Norvasc and to start 100 mg of the Losartan, she will double up on the pills she has, I informed her that a new RX for 50 mg will be sent to her pharmacy and she understood.  Shaiden Aldous,cma

## 2020-08-10 NOTE — Telephone Encounter (Signed)
Losartan 100 mg sent to the pharmacy. She will need labs 7-10 days after starting this does. Order placed.

## 2020-08-11 NOTE — Telephone Encounter (Signed)
I called the patient and informed her tha the new dose of Losartan was sent to the pharmacy and she scheduled labs after starting the new dose.  Monica Larson,cma

## 2020-08-21 ENCOUNTER — Other Ambulatory Visit (INDEPENDENT_AMBULATORY_CARE_PROVIDER_SITE_OTHER): Payer: PPO

## 2020-08-21 ENCOUNTER — Other Ambulatory Visit: Payer: Self-pay

## 2020-08-21 DIAGNOSIS — I10 Essential (primary) hypertension: Secondary | ICD-10-CM

## 2020-08-21 LAB — BASIC METABOLIC PANEL
BUN: 11 mg/dL (ref 6–23)
CO2: 32 mEq/L (ref 19–32)
Calcium: 9.2 mg/dL (ref 8.4–10.5)
Chloride: 102 mEq/L (ref 96–112)
Creatinine, Ser: 0.87 mg/dL (ref 0.40–1.20)
GFR: 65.18 mL/min (ref >60.00)
Glucose, Bld: 122 mg/dL — ABNORMAL HIGH (ref 70–99)
Potassium: 4.4 mEq/L (ref 3.5–5.1)
Sodium: 139 mEq/L (ref 135–145)

## 2020-09-07 ENCOUNTER — Telehealth: Payer: Self-pay

## 2020-09-07 MED ORDER — HYDROCHLOROTHIAZIDE 12.5 MG PO TABS
12.5000 mg | ORAL_TABLET | Freq: Every day | ORAL | 1 refills | Status: DC
Start: 2020-09-07 — End: 2020-10-10

## 2020-09-07 NOTE — Telephone Encounter (Signed)
Sent to pharmacy.  She needs follow-up with me in 1 month in the office.  Thanks.

## 2020-09-07 NOTE — Telephone Encounter (Signed)
Patient aware rx was sent and she has a follow up scheduled for 10/10/20

## 2020-09-07 NOTE — Telephone Encounter (Signed)
Based on a recent phone note with her she is currently on amlodipine 10 mg once daily and losartan 100 mg daily both of those are the maximum doses and thus we would need to use an additional medication for this.

## 2020-09-07 NOTE — Telephone Encounter (Signed)
Noted.  Several of her blood pressures are still not at goal.  Would she be willing to add on hydrochlorothiazide?  She will continue with losartan and amlodipine.

## 2020-09-07 NOTE — Telephone Encounter (Signed)
Pt called to give BP readings  12/19-128/62 12/20-136/81 131/67 in pm 12/21-127/68 12/22-131/67 128/62 in pm 12/23-144/75  12/24-140/70

## 2020-09-07 NOTE — Telephone Encounter (Signed)
Spoke with patient and she states she does not really want to start another medication unless she really has to as she is already on so much medication already. She wants to know if the other two could be increased at all first before trying another? She states that she will take the HCTZ if theres no other options.

## 2020-09-07 NOTE — Telephone Encounter (Signed)
Spoke with patient about below and she agrees to the HCTZ

## 2020-09-29 ENCOUNTER — Other Ambulatory Visit: Payer: Self-pay

## 2020-09-29 DIAGNOSIS — E782 Mixed hyperlipidemia: Secondary | ICD-10-CM

## 2020-09-29 MED ORDER — ROSUVASTATIN CALCIUM 10 MG PO TABS: 10.0000 mg | ORAL_TABLET | Freq: Every day | ORAL | 1 refills | Status: DC

## 2020-10-06 ENCOUNTER — Other Ambulatory Visit: Payer: Self-pay

## 2020-10-10 ENCOUNTER — Other Ambulatory Visit: Payer: Self-pay

## 2020-10-10 ENCOUNTER — Ambulatory Visit (INDEPENDENT_AMBULATORY_CARE_PROVIDER_SITE_OTHER): Payer: Medicare Other | Admitting: Family Medicine

## 2020-10-10 ENCOUNTER — Encounter: Payer: Self-pay | Admitting: Family Medicine

## 2020-10-10 DIAGNOSIS — E782 Mixed hyperlipidemia: Secondary | ICD-10-CM

## 2020-10-10 DIAGNOSIS — G4710 Hypersomnia, unspecified: Secondary | ICD-10-CM

## 2020-10-10 DIAGNOSIS — K219 Gastro-esophageal reflux disease without esophagitis: Secondary | ICD-10-CM

## 2020-10-10 DIAGNOSIS — N3941 Urge incontinence: Secondary | ICD-10-CM

## 2020-10-10 DIAGNOSIS — I10 Essential (primary) hypertension: Secondary | ICD-10-CM | POA: Diagnosis not present

## 2020-10-10 DIAGNOSIS — G629 Polyneuropathy, unspecified: Secondary | ICD-10-CM | POA: Diagnosis not present

## 2020-10-10 DIAGNOSIS — R2689 Other abnormalities of gait and mobility: Secondary | ICD-10-CM

## 2020-10-10 MED ORDER — PREGABALIN 100 MG PO CAPS: ORAL_CAPSULE | ORAL | 1 refills | Status: DC

## 2020-10-10 MED ORDER — TRAZODONE HCL 150 MG PO TABS: 150.0000 mg | ORAL_TABLET | Freq: Every day | ORAL | 3 refills | Status: DC

## 2020-10-10 MED ORDER — ROSUVASTATIN CALCIUM 10 MG PO TABS: 10.0000 mg | ORAL_TABLET | Freq: Every day | ORAL | 1 refills | Status: DC

## 2020-10-10 MED ORDER — DULOXETINE HCL 30 MG PO CPEP: 30.0000 mg | ORAL_CAPSULE | Freq: Every day | ORAL | 3 refills | Status: DC

## 2020-10-10 MED ORDER — OMEPRAZOLE 20 MG PO CPDR: 20.0000 mg | DELAYED_RELEASE_CAPSULE | Freq: Every day | ORAL | 1 refills | Status: DC | PRN

## 2020-10-10 MED ORDER — LOSARTAN POTASSIUM 100 MG PO TABS: 100.0000 mg | ORAL_TABLET | Freq: Every day | ORAL | 1 refills | Status: DC

## 2020-10-10 MED ORDER — AMLODIPINE BESYLATE 5 MG PO TABS: 5.0000 mg | ORAL_TABLET | Freq: Every day | ORAL | 3 refills | Status: DC

## 2020-10-10 MED ORDER — CARVEDILOL 3.125 MG PO TABS: 3.1250 mg | ORAL_TABLET | Freq: Two times a day (BID) | ORAL | 3 refills | Status: DC

## 2020-10-10 NOTE — Progress Notes (Signed)
Monica Rumps, MD Phone: [phone]  Trude Mcburney Skillman is a 75 y.o. female who presents today for follow-up.  Hypertension: Typically 120-150/50-75.  Taking amlodipine and losartan.  No chest pain or shortness of breath.  Occasional edema depending on what activity she does.  Hypersomnia: She notes this has been going on for quite a while.  She just does not have energy during the day.  She is sleepy and can fall asleep fairly easily.  She does snore.  She does not wake up well rested.  No noted apneic episodes.  She notes she tried her husband CPAP at one point notes she could not tolerate this.  Balance problem: This is an ongoing issue likely related to her neuropathy.  She notes if she gets up too quickly and turn she feels off balance.  No lightheadedness.  She does have numbness and burning in her feet and the Lyrica does help with that.  Urge incontinence: She follows with urology for this.  There is some confusion as to whether or not she is on Vesicare or oxybutynin.  Currently she says she is taking Vesicare.  Social History   Tobacco Use  Smoking Status Former Smoker  Smokeless Tobacco Never Used  Tobacco Comment   1 cig every 3 months     Current Outpatient Medications on File Prior to Visit  Medication Sig Dispense Refill  . amoxicillin (AMOXIL) 500 MG capsule     . aspirin 81 MG EC tablet Take 81 mg by mouth daily.     . diphenhydrAMINE (BENADRYL) 25 MG tablet Take 25 mg by mouth daily as needed for itching, allergies or sleep.    . Multiple Vitamins-Minerals (MULTIVITAMIN WITH MINERALS) tablet Take 1 tablet by mouth daily.     . nitrofurantoin, macrocrystal-monohydrate, (MACROBID) 100 MG capsule nitrofurantoin monohydrate/macrocrystals 100 mg capsule    . Omega-3 Fatty Acids (OMEGA-3 FISH OIL PO) Take 1 capsule by mouth daily.     . predniSONE (DELTASONE) 5 MG tablet prednisone 5 mg tablets in a dose pack  Take 1 dose pk by oral route as directed for 6 days.    Marland Kitchen  solifenacin (VESICARE) 10 MG tablet Take 10 mg by mouth daily.    . triazolam (HALCION) 0.25 MG tablet Take 0.5 mg by mouth at bedtime as needed.      No current facility-administered medications on file prior to visit.     ROS see history of present illness  Objective  Physical Exam Vitals:   10/10/20 1008  BP: 140/80  Pulse: 73  Temp: 98.3 F (36.8 C)  SpO2: 97%    BP Readings from Last 3 Encounters:  10/10/20 140/80  07/27/20 (!) 149/73  07/17/20 (!) 163/76   Wt Readings from Last 3 Encounters:  10/10/20 196 lb 6.4 oz (89.1 kg)  07/03/20 190 lb 9.6 oz (86.5 kg)  06/12/20 193 lb 9.6 oz (87.8 kg)    Physical Exam Constitutional:      General: She is not in acute distress.    Appearance: She is not diaphoretic.  Cardiovascular:     Rate and Rhythm: Normal rate and regular rhythm.     Heart sounds: Normal heart sounds.  Pulmonary:     Effort: Pulmonary effort is normal.     Breath sounds: Normal breath sounds.  Musculoskeletal:        General: No edema.     Right lower leg: No edema.     Left lower leg: No edema.  Skin:  General: Skin is warm and dry.  Neurological:     Mental Status: She is alert.     Comments: 5/5 strength in bilateral biceps, triceps, grip, quads, hamstrings, plantar and dorsiflexion, sensation to light touch intact in bilateral UE and LE, normal gait, positive Romberg      Assessment/Plan: Please see individual problem list.  Problem List Items Addressed This Visit    Balance problem    Neuropathy is contributing to this.  She will continue with Lyrica as outlined.      GERD (gastroesophageal reflux disease)   Relevant Medications   omeprazole (PRILOSEC) 20 MG capsule   Hypersomnia    Advised evaluating with a sleep study though she declines this as she does not think she would be able to tolerate treatment.  Discussed that this could be contributing to her elevated blood pressure and that untreated sleep apnea can cause chronic  issues.      Hypertension    Uncontrolled.  We will add carvedilol 3.125 mg twice daily.  She will continue losartan 100 mg daily and amlodipine 5 mg daily.  Follow-up in 1 month.  Discussed risk of fatigue and low heart rates with this.  If she notices low blood pressures or either of those issues she will let us know.      Relevant Medications   amLODipine (NORVASC) 5 MG tablet   losartan (COZAAR) 100 MG tablet   rosuvastatin (CRESTOR) 10 MG tablet   carvedilol (COREG) 3.125 MG tablet   Mixed hyperlipidemia   Relevant Medications   amLODipine (NORVASC) 5 MG tablet   losartan (COZAAR) 100 MG tablet   rosuvastatin (CRESTOR) 10 MG tablet   carvedilol (COREG) 3.125 MG tablet   Neuropathy    Stable.  I suspect this is contributing to her balance issues.  She will remain on Lyrica 100 mg twice daily.      Urge incontinence    She will continue to see urology.  She can continue with Vesicare as it has been beneficial.      Relevant Medications   solifenacin (VESICARE) 10 MG tablet    Other Visit Diagnoses    Essential hypertension       Relevant Medications   amLODipine (NORVASC) 5 MG tablet   losartan (COZAAR) 100 MG tablet   rosuvastatin (CRESTOR) 10 MG tablet   carvedilol (COREG) 3.125 MG tablet      This visit occurred during the SARS-CoV-2 public health emergency.  Safety protocols were in place, including screening questions prior to the visit, additional usage of staff PPE, and extensive cleaning of exam room while observing appropriate contact time as indicated for disinfecting solutions.    Monica Rumps, MD [place]

## 2020-10-10 NOTE — Patient Instructions (Addendum)
Nice to see you. We will start you on carvedilol for your blood pressure.  I will see you back in 1 month.  If you notice fatigue with starting this please let us know.

## 2020-10-11 DIAGNOSIS — G4710 Hypersomnia, unspecified: Secondary | ICD-10-CM | POA: Insufficient documentation

## 2020-10-11 NOTE — Assessment & Plan Note (Signed)
Uncontrolled.  We will add carvedilol 3.125 mg twice daily.  She will continue losartan 100 mg daily and amlodipine 5 mg daily.  Follow-up in 1 month.  Discussed risk of fatigue and low heart rates with this.  If she notices low blood pressures or either of those issues she will let us know.

## 2020-10-11 NOTE — Assessment & Plan Note (Signed)
Neuropathy is contributing to this.  She will continue with Lyrica as outlined.

## 2020-10-11 NOTE — Assessment & Plan Note (Signed)
Stable.  I suspect this is contributing to her balance issues.  She will remain on Lyrica 100 mg twice daily.

## 2020-10-11 NOTE — Assessment & Plan Note (Signed)
Advised evaluating with a sleep study though she declines this as she does not think she would be able to tolerate treatment.  Discussed that this could be contributing to her elevated blood pressure and that untreated sleep apnea can cause chronic issues.

## 2020-10-11 NOTE — Assessment & Plan Note (Signed)
She will continue to see urology.  She can continue with Vesicare as it has been beneficial.

## 2020-10-23 ENCOUNTER — Ambulatory Visit: Payer: Self-pay | Admitting: Urology

## 2020-10-26 ENCOUNTER — Telehealth: Payer: Self-pay

## 2020-10-26 ENCOUNTER — Ambulatory Visit: Payer: Medicare Other

## 2020-10-26 NOTE — Telephone Encounter (Signed)
No answer when called. Unable to leave message, no voicemail. Reschedule as convenient for patient.

## 2020-11-03 ENCOUNTER — Ambulatory Visit (INDEPENDENT_AMBULATORY_CARE_PROVIDER_SITE_OTHER): Payer: Medicare Other

## 2020-11-03 VITALS — Ht 67.0 in | Wt 196.0 lb

## 2020-11-03 DIAGNOSIS — Z1231 Encounter for screening mammogram for malignant neoplasm of breast: Secondary | ICD-10-CM

## 2020-11-03 DIAGNOSIS — Z0000 Encounter for general adult medical examination without abnormal findings: Secondary | ICD-10-CM

## 2020-11-03 NOTE — Patient Instructions (Addendum)
[name] , Thank you for taking time to come for your Medicare Wellness Visit. I appreciate your ongoing commitment to your health goals. Please review the following plan we discussed and let me know if I can assist you in the future.   These are the goals we discussed: Goals      Patient Stated   .  I want to walk more when the weather is good (pt-stated)       This is a list of the screening recommended for you and due dates:  Health Maintenance  Topic Date Due  . Tetanus Vaccine  Never done  . COVID-19 Vaccine (4 - Booster for Pfizer series) 11/05/2020  . Colon Cancer Screening  01/30/2026  . Flu Shot  Completed  . DEXA scan (bone density measurement)  Completed  .  Hepatitis C: One time screening is recommended by Center for Disease Control  (CDC) for  adults born from [birth year] through 1966.   Completed  . Pneumonia vaccines  Completed    Immunizations Immunization History  Administered Date(s) Administered  . Fluad Quad(high Dose 65+) 06/15/2019  . Influenza Split 06/03/2011  . Influenza, High Dose Seasonal PF 06/11/2016, 04/30/2017, 06/18/2018  . Influenza,inj,quad, With Preservative 06/15/2019  . Influenza-Unspecified 06/19/2013, 07/06/2014, 06/11/2020  . PFIZER(Purple Top)SARS-COV-2 Vaccination 09/16/2019, 10/09/2019, 05/05/2020  . Pneumococcal Conjugate-13 11/07/2014  . Pneumococcal Polysaccharide-23 07/20/2012  . Zoster 04/09/2013  . Zoster Recombinat (Shingrix) 02/27/2018, 06/18/2018   Keep all routine maintenance appointments.   Follow up 11/28/20 @ 12:00.  Advanced directives: End of life planning; Advance aging; Advanced directives discussed.  Copy of current HCPOA/Living Will requested.    Conditions/risks identified: none new.   Follow up in one year for your annual wellness visit.   Preventive Care 25 Years and Older, Female Preventive care refers to lifestyle choices and visits with your health care provider that can promote health and wellness. What  does preventive care include?  A yearly physical exam. This is also called an annual well check.  Dental exams once or twice a year.  Routine eye exams. Ask your health care provider how often you should have your eyes checked.  Personal lifestyle choices, including:  Daily care of your teeth and gums.  Regular physical activity.  Eating a healthy diet.  Avoiding tobacco and drug use.  Limiting alcohol use.  Practicing safe sex.  Taking low-dose aspirin every day.  Taking vitamin and mineral supplements as recommended by your health care provider. What happens during an annual well check? The services and screenings done by your health care provider during your annual well check will depend on your age, overall health, lifestyle risk factors, and family history of disease. Counseling  Your health care provider may ask you questions about your:  Alcohol use.  Tobacco use.  Drug use.  Emotional well-being.  Home and relationship well-being.  Sexual activity.  Eating habits.  History of falls.  Memory and ability to understand (cognition).  Work and work Statistician.  Reproductive health. Screening  You may have the following tests or measurements:  Height, weight, and BMI.  Blood pressure.  Lipid and cholesterol levels. These may be checked every 5 years, or more frequently if you are over 71 years old.  Skin check.  Lung cancer screening. You may have this screening every year starting at age 74 if you have a 30-pack-year history of smoking and currently smoke or have quit within the past 15 years.  Fecal occult blood test (FOBT)  of the stool. You may have this test every year starting at age 7.  Flexible sigmoidoscopy or colonoscopy. You may have a sigmoidoscopy every 5 years or a colonoscopy every 10 years starting at age 88.  Hepatitis C blood test.  Hepatitis B blood test.  Sexually transmitted disease (STD) testing.  Diabetes screening.  This is done by checking your blood sugar (glucose) after you have not eaten for a while (fasting). You may have this done every 1-3 years.  Bone density scan. This is done to screen for osteoporosis. You may have this done starting at age 73.  Mammogram. This may be done every 1-2 years. Talk to your health care provider about how often you should have regular mammograms. Talk with your health care provider about your test results, treatment options, and if necessary, the need for more tests. Vaccines  Your health care provider may recommend certain vaccines, such as:  Influenza vaccine. This is recommended every year.  Tetanus, diphtheria, and acellular pertussis (Tdap, Td) vaccine. You may need a Td booster every 10 years.  Zoster vaccine. You may need this after age 67.  Pneumococcal 13-valent conjugate (PCV13) vaccine. One dose is recommended after age 37.  Pneumococcal polysaccharide (PPSV23) vaccine. One dose is recommended after age 37. Talk to your health care provider about which screenings and vaccines you need and how often you need them. This information is not intended to replace advice given to you by your health care provider. Make sure you discuss any questions you have with your health care provider. Document Released: 09/22/2015 Document Revised: 05/15/2016 Document Reviewed: 06/27/2015 Elsevier Interactive Patient Education  2017 [place] Prevention in the Home Falls can cause injuries. They can happen to people of all ages. There are many things you can do to make your home safe and to help prevent falls. What can I do on the outside of my home?  Regularly fix the edges of walkways and driveways and fix any cracks.  Remove anything that might make you trip as you walk through a door, such as a raised step or threshold.  Trim any bushes or trees on the path to your home.  Use bright outdoor lighting.  Clear any walking paths of anything that might  make someone trip, such as rocks or tools.  Regularly check to see if handrails are loose or broken. Make sure that both sides of any steps have handrails.  Any raised decks and porches should have guardrails on the edges.  Have any leaves, snow, or ice cleared regularly.  Use sand or salt on walking paths during winter.  Clean up any spills in your garage right away. This includes oil or grease spills. What can I do in the bathroom?  Use night lights.  Install grab bars by the toilet and in the tub and shower. Do not use towel bars as grab bars.  Use non-skid mats or decals in the tub or shower.  If you need to sit down in the shower, use a plastic, non-slip stool.  Keep the floor dry. Clean up any water that spills on the floor as soon as it happens.  Remove soap buildup in the tub or shower regularly.  Attach bath mats securely with double-sided non-slip rug tape.  Do not have throw rugs and other things on the floor that can make you trip. What can I do in the bedroom?  Use night lights.  Make sure that you have a light by  your bed that is easy to reach.  Do not use any sheets or blankets that are too big for your bed. They should not hang down onto the floor.  Have a firm chair that has side arms. You can use this for support while you get dressed.  Do not have throw rugs and other things on the floor that can make you trip. What can I do in the kitchen?  Clean up any spills right away.  Avoid walking on wet floors.  Keep items that you use a lot in easy-to-reach places.  If you need to reach something above you, use a strong step stool that has a grab bar.  Keep electrical cords out of the way.  Do not use floor polish or wax that makes floors slippery. If you must use wax, use non-skid floor wax.  Do not have throw rugs and other things on the floor that can make you trip. What can I do with my stairs?  Do not leave any items on the stairs.  Make sure  that there are handrails on both sides of the stairs and use them. Fix handrails that are broken or loose. Make sure that handrails are as long as the stairways.  Check any carpeting to make sure that it is firmly attached to the stairs. Fix any carpet that is loose or worn.  Avoid having throw rugs at the top or bottom of the stairs. If you do have throw rugs, attach them to the floor with carpet tape.  Make sure that you have a light switch at the top of the stairs and the bottom of the stairs. If you do not have them, ask someone to add them for you. What else can I do to help prevent falls?  Wear shoes that:  Do not have high heels.  Have rubber bottoms.  Are comfortable and fit you well.  Are closed at the toe. Do not wear sandals.  If you use a stepladder:  Make sure that it is fully opened. Do not climb a closed stepladder.  Make sure that both sides of the stepladder are locked into place.  Ask someone to hold it for you, if possible.  Clearly mark and make sure that you can see:  Any grab bars or handrails.  First and last steps.  Where the edge of each step is.  Use tools that help you move around (mobility aids) if they are needed. These include:  Canes.  Walkers.  Scooters.  Crutches.  Turn on the lights when you go into a dark area. Replace any light bulbs as soon as they burn out.  Set up your furniture so you have a clear path. Avoid moving your furniture around.  If any of your floors are uneven, fix them.  If there are any pets around you, be aware of where they are.  Review your medicines with your doctor. Some medicines can make you feel dizzy. This can increase your chance of falling. Ask your doctor what other things that you can do to help prevent falls. This information is not intended to replace advice given to you by your health care provider. Make sure you discuss any questions you have with your health care provider. Document  Released: 06/22/2009 Document Revised: 02/01/2016 Document Reviewed: 09/30/2014 Elsevier Interactive Patient Education  2017 [employer].

## 2020-11-03 NOTE — Progress Notes (Signed)
Subjective:   Monica Larson is a 75 y.o. female who presents for Medicare Annual (Subsequent) preventive examination.  Review of Systems    No ROS.  Medicare Wellness Virtual Visit.    Cardiac Risk Factors include: advanced age (>31men, >7 women);hypertension     Objective:    Today's Vitals   11/03/20 0835  Weight: 196 lb (88.9 kg)  Height: 5\' 7"  (1.702 m)   Body mass index is 30.7 kg/m.  Advanced Directives 11/03/2020 10/26/2019 10/21/2018 12/10/2017 10/17/2016 05/10/2016 01/31/2016  Does Patient Have a Medical Advance Directive? Yes Yes Yes Yes Yes No Yes  Type of Paramedic of [place];Living will Healthcare Power of [place];Living will [place];Living will - -  Does patient want to make changes to medical advance directive? No - Patient declined No - Patient declined No - Patient declined No - Patient declined No - Patient declined - -  Copy of [place] in Chart? No - copy requested No - copy requested - No - copy requested No - copy requested - -    Current Medications (verified) Outpatient Encounter Medications as of 11/03/2020  Medication Sig  . amLODipine (NORVASC) 5 MG tablet Take 1 tablet (5 mg total) by mouth daily.  Marland Kitchen amoxicillin (AMOXIL) 500 MG capsule   . aspirin 81 MG EC tablet Take 81 mg by mouth daily.   . carvedilol (COREG) 3.125 MG tablet Take 1 tablet (3.125 mg total) by mouth 2 (two) times daily with a meal.  . diphenhydrAMINE (BENADRYL) 25 MG tablet Take 25 mg by mouth daily as needed for itching, allergies or sleep.  . DULoxetine (CYMBALTA) 30 MG capsule Take 1 capsule (30 mg total) by mouth daily.  Marland Kitchen losartan (COZAAR) 100 MG tablet Take 1 tablet (100 mg total) by mouth daily. In am  . Multiple Vitamins-Minerals (MULTIVITAMIN WITH MINERALS) tablet Take 1 tablet by mouth daily.   . nitrofurantoin, macrocrystal-monohydrate, (MACROBID) 100 MG capsule nitrofurantoin  monohydrate/macrocrystals 100 mg capsule  . Omega-3 Fatty Acids (OMEGA-3 FISH OIL PO) Take 1 capsule by mouth daily.   Marland Kitchen omeprazole (PRILOSEC) 20 MG capsule Take 1 capsule (20 mg total) by mouth daily as needed. For acid reflux  . predniSONE (DELTASONE) 5 MG tablet prednisone 5 mg tablets in a dose pack  Take 1 dose pk by oral route as directed for 6 days.  . pregabalin (LYRICA) 100 MG capsule TAKE 1 CAPSULE(100 MG) BY MOUTH TWICE DAILY  . rosuvastatin (CRESTOR) 10 MG tablet Take 1 tablet (10 mg total) by mouth daily.  . solifenacin (VESICARE) 10 MG tablet Take 10 mg by mouth daily.  . traZODone (DESYREL) 150 MG tablet Take 1 tablet (150 mg total) by mouth at bedtime.  . triazolam (HALCION) 0.25 MG tablet Take 0.5 mg by mouth at bedtime as needed.    No facility-administered encounter medications on file as of 11/03/2020.    Allergies (verified) Lisinopril, Shellfish allergy, Sulfa antibiotics, and Sulfonamide derivatives   History: Past Medical History:  Diagnosis Date  . Arthritis    HANDS/NECK/SHOULDERS  . Asthma    SEASONAL  . Diarrhea    INTERMITTENT CHRONIC  . Fatigue   . GERD (gastroesophageal reflux disease)   . H/O motion sickness 06/17/2016  . Headache    SINUS/ ALLERGIES  . HLD (hyperlipidemia)    borderline  . HTN (hypertension)    CONTROLLED ON MEDS  . Motion sickness   . Seasonal  allergies   . Shortness of breath dyspnea    ON EXERTION   Past Surgical History:  Procedure Laterality Date  . BREAST BIOPSY Bilateral    NEG  . BREAST EXCISIONAL BIOPSY Bilateral    NEG  . CARPAL TUNNEL RELEASE     right wrist  . CHOLECYSTECTOMY    . COLONOSCOPY N/A 01/31/2016   Procedure: COLONOSCOPY;  Surgeon: Hulen Luster, MD;  Location: [facility];  Service: Gastroenterology;  Laterality: N/A;  . EYE SURGERY     muscular as child/ CATARACT BILATERAL  . SKIN CANCER EXCISION Right Wrist and thigh (x2)   Unsure of dates and Dermatologist  . VESICOVAGINAL FISTULA  CLOSURE W/ TAH     Family History  Problem Relation Age of Onset  . Ovarian cancer Mother   . Prostate cancer Father   . Coronary artery disease Father   . Diabetes Father   . Heart attack Father   . Heart attack Brother   . Breast cancer Neg Hx    Social History   Socioeconomic History  . Marital status: Married    Spouse name: Not on file  . Number of children: Not on file  . Years of education: Not on file  . Highest education level: Not on file  Occupational History  . Not on file  Tobacco Use  . Smoking status: Former Research scientist (life sciences)  . Smokeless tobacco: Never Used  . Tobacco comment: 1 cig every 3 months   Vaping Use  . Vaping Use: Never used  Substance and Sexual Activity  . Alcohol use: Yes    Comment: 1 MIXED DRINK/WEEK  . Drug use: No  . Sexual activity: Not on file  Other Topics Concern  . 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    Social Determinants of Health   Financial Resource Strain: Low Risk   . Difficulty of Paying Living Expenses: Not hard at all  Food Insecurity: No Food Insecurity  . Worried About Charity fundraiser in the Last Year: Never true  . Ran Out of Food in the Last Year: Never true  Transportation Needs: No Transportation Needs  . Lack of Transportation (Medical): No  . Lack of Transportation (Non-Medical): No  Physical Activity: Not on file  Stress: No Stress Concern Present  . Feeling of Stress : Not at all  Social Connections: Unknown  . Frequency of Communication with Friends and Family: More than three times a week  . Frequency of Social Gatherings with Friends and Family: More than three times a week  . Attends Religious Services: Not on file  . Active Member of Clubs or Organizations: Not on file  . Attends Archivist Meetings: Not on file  . Marital Status: Married    Tobacco Counseling Counseling given: Not Answered Comment: 1 cig every 3 months    Clinical  Intake:  Pre-visit preparation completed: Yes        Diabetes: No  How often do you need to have someone help you when you read instructions, pamphlets, or other written materials from your doctor or pharmacy?: 1 - Never    Interpreter Needed?: No      Activities of Daily Living In your present state of health, do you have any difficulty performing the following activities: 11/03/2020  Hearing? N  Vision? N  Difficulty concentrating or making decisions? N  Walking or climbing stairs? N  Dressing or bathing? N  Doing errands, shopping? N  Preparing Food and eating ? N  Using the Toilet? N  In the past six months, have you accidently leaked urine? Y  Comment Followed by Urology. Managed with daily brief. UI.  Do you have problems with loss of bowel control? N  Managing your Medications? N  Managing your Finances? Y  Comment Husband manages  Housekeeping or managing your Housekeeping? N  Some recent data might be hidden    Patient Care Team: Leone Haven, MD as PCP - General (Family Medicine)  Indicate any recent Medical Services you may have received from other than [facility] providers in the past year (date may be approximate).     Assessment:   This is a routine wellness examination for [place].  I connected with Sharilyn today by telephone and verified that I am speaking with the correct person using two identifiers. Location patient: home Location provider: work Persons participating in the virtual visit: patient, Marine scientist.    I discussed the limitations, risks, security and privacy concerns of performing an evaluation and management service by telephone and the availability of in person appointments. The patient expressed understanding and verbally consented to this telephonic visit.    Interactive audio and video telecommunications were attempted between this provider and patient, however failed, due to patient having technical difficulties OR patient did not have  access to video capability.  We continued and completed visit with audio only.  Some vital signs may be absent or patient reported.   Hearing/Vision screen  Hearing Screening   125Hz  250Hz  500Hz  1000Hz  2000Hz  3000Hz  4000Hz  6000Hz  8000Hz   Right ear:           Left ear:           Comments: Patient is able to hear conversational tones without difficulty.  No issues reported.  Vision Screening Comments: [facility])  Wears corrective lenses  Cataracts, extracted, bilateral   Dietary issues and exercise activities discussed: Current Exercise Habits: Home exercise routine, Type of exercise: walking, Intensity: Mild  Regular diet Good water intake  Goals      Patient Stated   .  I want to walk more when the weather is good (pt-stated)      Depression Screen PHQ 2/9 Scores 11/03/2020 10/10/2020 07/03/2020 12/29/2019 10/26/2019 10/25/2019 10/21/2018  PHQ - 2 Score 0 0 0 0 0 0 0    Fall Risk Fall Risk  11/03/2020 10/10/2020 07/03/2020 12/29/2019 10/26/2019  Falls in the past year? 1 1 0 0 (No Data)  Comment - - - - No change in the last 24 hours  Number falls in past yr: 0 0 0 0 -  Injury with Fall? 0 0 - - -  Risk Factor Category  - - - - -  Risk for fall due to : - - - - -  Follow up Falls evaluation completed Falls evaluation completed Falls evaluation completed Falls evaluation completed Falls evaluation completed    [place]: Handrails in use when climbing stairs? Yes Home free of loose throw rugs in walkways, pet beds, electrical cords, etc? Yes  Adequate lighting in your home to reduce risk of falls? Yes   ASSISTIVE DEVICES UTILIZED TO PREVENT FALLS: Life alert? No  Use of a cane, walker or w/c? No  Grab bars in the bathroom? Yes   Shower chair or bench in shower? Yes  Elevated toilet seat or a handicapped toilet? Yes   TIMED UP  AND GO: Was the test performed? No . Virtual visit.   Cognitive Function: Patient is  alert and oriented x3.  Denies difficulty focusing, making decisions, memory loss.  Enjoys reading, playing cards and other brain health activities.  MMSE/6CIT deferred. Normal by direct communication/observation.  MMSE - Mini Mental State Exam 10/26/2019  Not completed: Refused     6CIT Screen 11/03/2020 10/21/2018 10/17/2016  What Year? 0 points 0 points 0 points  What month? 0 points 0 points 0 points  What time? 0 points 0 points 0 points  Count back from 20 - 0 points 0 points  Months in reverse 2 points 4 points 0 points  Repeat phrase - 0 points 0 points  Total Score - 4 0    Immunizations Immunization History  Administered Date(s) Administered  . Fluad Quad(high Dose 65+) 06/15/2019  . Influenza Split 06/03/2011  . Influenza, High Dose Seasonal PF 06/11/2016, 04/30/2017, 06/18/2018  . Influenza,inj,quad, With Preservative 06/15/2019  . Influenza-Unspecified 06/19/2013, 07/06/2014, 06/11/2020  . PFIZER(Purple Top)SARS-COV-2 Vaccination 09/16/2019, 10/09/2019, 05/05/2020  . Pneumococcal Conjugate-13 11/07/2014  . Pneumococcal Polysaccharide-23 07/20/2012  . Zoster 04/09/2013  . Zoster Recombinat (Shingrix) 02/27/2018, 06/18/2018    TDAP status: Due, Education has been provided regarding the importance of this vaccine. Advised may receive this vaccine at local pharmacy or Health Dept. Aware to provide a copy of the vaccination record if obtained from local pharmacy or Health Dept. Verbalized acceptance and understanding. Deferred.   Health Maintenance Health Maintenance  Topic Date Due  . TETANUS/TDAP  Never done  . COVID-19 Vaccine (4 - Booster for Pfizer series) 11/05/2020  . COLONOSCOPY (Pts 45-60yrs Insurance coverage will need to be confirmed)  01/30/2026  . INFLUENZA VACCINE  Completed  . DEXA SCAN  Completed  . Hepatitis C Screening  Completed  . PNA vac Low Risk Adult  Completed   Colorectal cancer screening: Type of screening: Colonoscopy. Completed 01/31/16.  Repeat every 10 years  Mammogram- ordered per request. [facility] number provided for scheduling [phone].   Lung Cancer Screening: (Low Dose CT Chest recommended if Age 8-80 years, 30 pack-year currently smoking OR have quit w/in 15years.) does not qualify.   Vision Screening: Recommended annual ophthalmology exams for early detection of glaucoma and other disorders of the eye. Is the patient up to date with their annual eye exam?  Yes  Who is the provider or what is the name of the office in which the patient attends annual eye exams? Dr. Jene Every, [facility].  Dental Screening: Recommended annual dental exams for proper oral hygiene. Visits every 6 months.   Community Resource Referral / Chronic Care Management: CRR required this visit?  No   CCM required this visit?  No      Plan:   Keep all routine maintenance appointments.   Follow up 11/28/20 @ 12:00.  I have personally reviewed and noted the following in the patient's chart:   . Medical and social history . Use of alcohol, tobacco or illicit drugs  . Current medications and supplements- no opioids in use . Functional ability and status . Nutritional status . Physical activity . Advanced directives . List of other physicians . Hospitalizations, surgeries, and ER visits in previous 12 months . Vitals . Screenings to include cognitive, depression, and falls . Referrals and appointments  In addition, I have reviewed and discussed with patient certain preventive protocols, quality metrics, and best practice recommendations. A written personalized care plan for preventive services as well as  general preventive health recommendations were provided to patient via mychart.     Varney Biles, LPN   [id number]/5320

## 2020-11-09 ENCOUNTER — Ambulatory Visit (LOCAL_COMMUNITY_HEALTH_CENTER): Payer: Medicare Other

## 2020-11-09 ENCOUNTER — Other Ambulatory Visit: Payer: Self-pay

## 2020-11-09 DIAGNOSIS — Z23 Encounter for immunization: Secondary | ICD-10-CM

## 2020-11-09 NOTE — Progress Notes (Signed)
In [facility], mobile unit, for Tdap. Tolerated Tdap well. [facility] updated and copy given. Josie Saunders, RN

## 2020-11-14 ENCOUNTER — Telehealth: Payer: Self-pay | Admitting: Family Medicine

## 2020-11-14 DIAGNOSIS — Z1231 Encounter for screening mammogram for malignant neoplasm of breast: Secondary | ICD-10-CM

## 2020-11-14 NOTE — Telephone Encounter (Signed)
Patient called in for a referral for mammogram  At [facility]

## 2020-11-15 NOTE — Telephone Encounter (Signed)
If she having any issues with her breasts?  If not I can place an order for a screening mammogram.

## 2020-11-20 ENCOUNTER — Encounter: Payer: Self-pay | Admitting: Urology

## 2020-11-20 ENCOUNTER — Telehealth: Payer: Self-pay | Admitting: Urology

## 2020-11-20 ENCOUNTER — Other Ambulatory Visit: Payer: Self-pay

## 2020-11-20 ENCOUNTER — Ambulatory Visit: Payer: Medicare Other | Admitting: Urology

## 2020-11-20 ENCOUNTER — Telehealth: Payer: Self-pay

## 2020-11-20 VITALS — BP 148/70 | HR 74 | Ht 67.0 in | Wt 199.0 lb

## 2020-11-20 DIAGNOSIS — R319 Hematuria, unspecified: Secondary | ICD-10-CM | POA: Diagnosis not present

## 2020-11-20 DIAGNOSIS — N3946 Mixed incontinence: Secondary | ICD-10-CM

## 2020-11-20 LAB — URINALYSIS, COMPLETE
Bilirubin, UA: NEGATIVE
Glucose, UA: NEGATIVE
Ketones, UA: NEGATIVE
Nitrite, UA: NEGATIVE
Specific Gravity, UA: 1.025 (ref 1.005–1.030)
Urobilinogen, Ur: 0.2 mg/dL (ref 0.2–1.0)
pH, UA: 5 (ref 5.0–7.5)

## 2020-11-20 LAB — MICROSCOPIC EXAMINATION: WBC, UA: >30 /hpf — AB (ref 0–5)

## 2020-11-20 MED ORDER — SOLIFENACIN SUCCINATE 10 MG PO TABS: 10.0000 mg | ORAL_TABLET | Freq: Every day | ORAL | 3 refills | Status: DC

## 2020-11-20 MED ORDER — ESTRADIOL 0.1 MG/GM VA CREA: TOPICAL_CREAM | VAGINAL | 12 refills | Status: DC

## 2020-11-20 MED ORDER — ESTRADIOL 0.1 MG/GM VA CREA
TOPICAL_CREAM | VAGINAL | 12 refills | Status: DC
Start: 2020-11-20 — End: 2020-11-20

## 2020-11-20 NOTE — Telephone Encounter (Signed)
Pt. Called to give her pharmacy information :  [facility] Ph: [phone]

## 2020-11-20 NOTE — Telephone Encounter (Signed)
Pt calls triage line and states the she would like rx for estradiol cream sent to [facility] in [place]. RX sent.

## 2020-11-20 NOTE — Addendum Note (Signed)
Addended by: Evelina Bucy on: 11/20/2020 11:20 AM   Modules accepted: Orders

## 2020-11-20 NOTE — Progress Notes (Signed)
11/20/2020 9:44 AM   Monica Larson 10/28/1945 [id number]  Referring provider: Leone Haven, MD [address] [place],  [place] [address]  Chief Complaint  Patient presents with  . Urinary Frequency    HPI: Dr Brandon:Patient has urethral stricture and perform self-catheterization once per month. She has urge incontinence and has been on oxybutynin Toviaz imipramine and Vesicare. She has had a hysterectomy. In 2015 she had a cystocele repair and sling.Last residual 0 mL. A number of months ago she had a positive culture.   The patient currently might leak a small amount with coughing sneezing and lifting heavy. She can have urge incontinence if she drinks a lot of fluids. She may have some dampness while she sleeping. She wears 3 pads a day that are damp and she tends to be quite fastidious  She voids every 2 or 3 hours and gets up once or twice a night. Flow was reasonable.  She is to catheterize once or twice a week and then once a week but much less frequently now.   She feels some bulging in the vagina. She has had a hysterectomy. She does not reduce the cystocele. She was having some nonspecific vaginal pain and this is why she felt the prolapse was worse. She said that when she tried to void it was hard to go and it hurt in the vagina and then she had trouble catheterizing. This symptom has improved some since. She does report some vaginal dryness and is been using local estrogen cream twice a week for about 4 months  On pelvic examination patient had a grade 2 cystocele with moderate central defect that would descend a few centimeters and almost hinging over a fairly fixed urethra. Her vaginal cuff descended from 8 or 9 cm to approximately 7 cm. I had her bear down multiple times. Minimal hypermobility the bladder neck and no stress incontinence. Very small to no grade 1 rectocele. Tissues were a bit inflamed  Patient has a complicated  history of recurrent urethral stricture. She has had a sling and cystocele repair. She has prolapse symptoms. She has mild mixed incontinence. She has mild frequency and nocturia.A picture was drawn. I can see why she might feel the cystocele but treatment of local vaginal dryness may make it much less symptomatic and or take away any vaginal discomfort or sensation. If she ever had surgery she would need to be consented for a transvaginal vault suspension and cystocele repair and graft recognizing she only would probably have an anterior repair. The recurrence rate based on local findings may be a little bit higher.  Patient understands that any infections or trouble to physically catheterize are not due to cystocele. She empties well. I can see how her anatomy could affect her voiding with the potential hinge effect. She understands though that a lot of her symptoms may not be helped by repeat prolapse surgery. I decided not to discuss the role of urodynamics at this stage. She will increase estrogen to 3 and even 4 times a week   I educated her that her sling may have helped her 5 or 6 years ago and she was hoping that it could be removed or that something could be done because it was not done right in the first place. I think she understood my counseling. I think it is important that she has reasonable goals on what she is consenting to if she ever has repeat surgery.  The patient said  she is most bothered by her incontinence.  She can leak with coughing sneezing.  When she time voids every 2 hours at home she does quite well.  I think she may get a little bit urge incontinence.  She said since she is use the estrogen cream multiple times a week feeling any bulging is dramatically better.  She feels more comfortable with less discomfort.    On repeat examination her tissues were no longer inflamed.  There is no question she is a high grade 2 cystocele with almost a trapdoor effect.  I  could see why she could have some flow symptoms with hinging over the urethra.  Cystoscopy: normal  Picture was drawn.  Watchful waiting for prolapse recommended and I do believe a lot of her symptoms were due to dryness.  Tapering program over time described.  Role of urodynamics for incontinence discussed.  A bulking agent for stress incontinence would likely be the best option especially with her retention history.  She says she infrequently catheterizes now and it is much reduced.  I think she is at high risk of retention by repeating the sling   Patient does not want urodynamics.  She will stay on oxybutynin 1-3 times a day and estrogen twice a day.  I will check on her in 3 months.  She understands that I could not do a bulking agent without urodynamics.  I find that sometimes it might be difficult to reach her goals but I think she understood very well the issues.    Today Pelvic prolapse symptoms much more comfortable.  Very pleased.  Uses estrogen cream 2-3 times a week.  Titration discussed  Urge incontinence mild and stable on Vesicare 10 mg  No infections.  Frequency stable     PMH: Past Medical History:  Diagnosis Date  . Arthritis    HANDS/NECK/SHOULDERS  . Asthma    SEASONAL  . Diarrhea    INTERMITTENT CHRONIC  . Fatigue   . GERD (gastroesophageal reflux disease)   . H/O motion sickness 06/17/2016  . Headache    SINUS/ ALLERGIES  . HLD (hyperlipidemia)    borderline  . HTN (hypertension)    CONTROLLED ON MEDS  . Motion sickness   . Seasonal allergies   . Shortness of breath dyspnea    ON EXERTION    Surgical History: Past Surgical History:  Procedure Laterality Date  . BREAST BIOPSY Bilateral    NEG  . BREAST EXCISIONAL BIOPSY Bilateral    NEG  . CARPAL TUNNEL RELEASE     right wrist  . CHOLECYSTECTOMY    . COLONOSCOPY N/A 01/31/2016   Procedure: COLONOSCOPY;  Surgeon: Hulen Luster, MD;  Location: [facility];  Service: Gastroenterology;   Laterality: N/A;  . EYE SURGERY     muscular as child/ CATARACT BILATERAL  . SKIN CANCER EXCISION Right Wrist and thigh (x2)   Unsure of dates and Dermatologist  . VESICOVAGINAL FISTULA CLOSURE W/ TAH      Home Medications:  Allergies as of 11/20/2020      Reactions   Lisinopril    Cough   Shellfish Allergy    Sulfa Antibiotics    hives   Sulfonamide Derivatives       Medication List       Accurate as of November 20, 2020  9:44 AM. If you have any questions, ask your nurse or doctor.        amLODipine 5 MG tablet Commonly known as: NORVASC  Take 1 tablet (5 mg total) by mouth daily.   amoxicillin 500 MG capsule Commonly known as: AMOXIL   aspirin 81 MG EC tablet Take 81 mg by mouth daily.   carvedilol 3.125 MG tablet Commonly known as: COREG Take 1 tablet (3.125 mg total) by mouth 2 (two) times daily with a meal.   diphenhydrAMINE 25 MG tablet Commonly known as: BENADRYL Take 25 mg by mouth daily as needed for itching, allergies or sleep.   DULoxetine 30 MG capsule Commonly known as: Cymbalta Take 1 capsule (30 mg total) by mouth daily.   losartan 100 MG tablet Commonly known as: Cozaar Take 1 tablet (100 mg total) by mouth daily. In am   multivitamin with minerals tablet Take 1 tablet by mouth daily.   nitrofurantoin (macrocrystal-monohydrate) 100 MG capsule Commonly known as: MACROBID nitrofurantoin monohydrate/macrocrystals 100 mg capsule   OMEGA-3 FISH OIL PO Take 1 capsule by mouth daily.   omeprazole 20 MG capsule Commonly known as: PRILOSEC Take 1 capsule (20 mg total) by mouth daily as needed. For acid reflux   predniSONE 5 MG tablet Commonly known as: DELTASONE   pregabalin 100 MG capsule Commonly known as: LYRICA TAKE 1 CAPSULE(100 MG) BY MOUTH TWICE DAILY   rosuvastatin 10 MG tablet Commonly known as: CRESTOR Take 1 tablet (10 mg total) by mouth daily.   solifenacin 10 MG tablet Commonly known as: VESICARE Take 10 mg by mouth  daily.   traZODone 150 MG tablet Commonly known as: DESYREL Take 1 tablet (150 mg total) by mouth at bedtime.   triazolam 0.25 MG tablet Commonly known as: HALCION Take 0.5 mg by mouth at bedtime as needed.       Allergies:  Allergies  Allergen Reactions  . Lisinopril     Cough   . Shellfish Allergy   . Sulfa Antibiotics     hives  . Sulfonamide Derivatives     Family History: Family History  Problem Relation Age of Onset  . Ovarian cancer Mother   . Prostate cancer Father   . Coronary artery disease Father   . Diabetes Father   . Heart attack Father   . Heart attack Brother   . Breast cancer Neg Hx     Social History:  reports that she has quit smoking. She has never used smokeless tobacco. She reports current alcohol use. She reports that she does not use drugs.  ROS:                                        Physical Exam: There were no vitals taken for this visit.  Constitutional:  Alert and oriented, No acute distress.   Laboratory Data: Lab Results  Component Value Date   WBC 4.2 11/04/2019   HGB 14.3 11/04/2019   HCT 43.0 11/04/2019   MCV 95.2 11/04/2019   PLT 167.0 11/04/2019    Lab Results  Component Value Date   CREATININE 0.87 08/21/2020    No results found for: PSA  No results found for: TESTOSTERONE  Lab Results  Component Value Date   HGBA1C 5.8 11/04/2019    Urinalysis    Component Value Date/Time   COLORURINE AMBER (A) 08/19/2019 1212   APPEARANCEUR Cloudy (A) 06/09/2020 0852   LABSPEC 1.015 08/19/2019 1212   PHURINE  08/19/2019 1212    TEST NOT REPORTED DUE TO COLOR INTERFERENCE OF URINE PIGMENT  GLUCOSEU Negative 06/09/2020 0852   GLUCOSEU NEGATIVE 05/04/2014 1113   HGBUR (A) 08/19/2019 1212    TEST NOT REPORTED DUE TO COLOR INTERFERENCE OF URINE PIGMENT   BILIRUBINUR Negative 06/09/2020 0852   KETONESUR trace (5) (A) 04/07/2020 1428   KETONESUR (A) 08/19/2019 1212    TEST NOT REPORTED DUE TO  COLOR INTERFERENCE OF URINE PIGMENT   PROTEINUR 1+ (A) 06/09/2020 0852   PROTEINUR (A) 08/19/2019 1212    TEST NOT REPORTED DUE TO COLOR INTERFERENCE OF URINE PIGMENT   UROBILINOGEN 0.2 04/07/2020 1428   UROBILINOGEN 0.2 05/04/2014 1113   NITRITE Negative 06/09/2020 0852   NITRITE (A) 08/19/2019 1212    TEST NOT REPORTED DUE TO COLOR INTERFERENCE OF URINE PIGMENT   LEUKOCYTESUR 2+ (A) 06/09/2020 0852   LEUKOCYTESUR (A) 08/19/2019 1212    TEST NOT REPORTED DUE TO COLOR INTERFERENCE OF URINE PIGMENT    Pertinent Imaging:   Assessment & Plan: Both prescriptions renewed and I will see near  There are no diagnoses linked to this encounter.  No follow-ups on file.  Reece Packer, MD  [facility] [address] [place], [place] [address] [phone]

## 2020-11-21 ENCOUNTER — Other Ambulatory Visit: Payer: Self-pay | Admitting: Family Medicine

## 2020-11-21 DIAGNOSIS — Z1231 Encounter for screening mammogram for malignant neoplasm of breast: Secondary | ICD-10-CM

## 2020-11-21 MED ORDER — SOLIFENACIN SUCCINATE 10 MG PO TABS: 10.0000 mg | ORAL_TABLET | Freq: Every day | ORAL | 11 refills | Status: DC

## 2020-11-21 MED ORDER — ESTRADIOL 0.1 MG/GM VA CREA: TOPICAL_CREAM | VAGINAL | 12 refills | Status: DC

## 2020-11-21 NOTE — Telephone Encounter (Signed)
I called the patient and she stated she did not have any issues with her breast.  Nina,cma

## 2020-11-21 NOTE — Telephone Encounter (Signed)
Mammogram ordered. She can call to schedule this at her convenience.

## 2020-11-22 NOTE — Telephone Encounter (Signed)
Called and spoke to Layani's husband Francee Piccolo. Roger verbalized understanding and had no further questions.

## 2020-11-23 LAB — CULTURE, URINE COMPREHENSIVE

## 2020-11-28 ENCOUNTER — Other Ambulatory Visit: Payer: Self-pay

## 2020-11-28 ENCOUNTER — Ambulatory Visit (INDEPENDENT_AMBULATORY_CARE_PROVIDER_SITE_OTHER): Payer: Medicare Other | Admitting: Family Medicine

## 2020-11-28 ENCOUNTER — Encounter: Payer: Self-pay | Admitting: Family Medicine

## 2020-11-28 DIAGNOSIS — G629 Polyneuropathy, unspecified: Secondary | ICD-10-CM | POA: Diagnosis not present

## 2020-11-28 DIAGNOSIS — I10 Essential (primary) hypertension: Secondary | ICD-10-CM | POA: Diagnosis not present

## 2020-11-28 MED ORDER — PREGABALIN 150 MG PO CAPS: ORAL_CAPSULE | ORAL | 0 refills | Status: DC

## 2020-11-28 MED ORDER — CARVEDILOL 6.25 MG PO TABS: 6.2500 mg | ORAL_TABLET | Freq: Two times a day (BID) | ORAL | 1 refills | Status: DC

## 2020-11-28 NOTE — Patient Instructions (Signed)
Nice to see you. We are going to increase your carvedilol to 6.25 mg twice daily.  If you notice a heart rate less than 55 or any fatigue with this please let us know. We are also going to increase your Lyrica to 150 mg twice daily.  If you notice any drowsiness or increasing balance issues with this medication please let us know.

## 2020-11-28 NOTE — Assessment & Plan Note (Signed)
Ongoing issues with this.  We will try to increase her Lyrica to 150 mg twice daily.  If she notices any balance issues or drowsiness with this she will let us know.

## 2020-11-28 NOTE — Progress Notes (Signed)
Monica Rumps, MD Phone: [phone]  Trude Mcburney Kester is a 75 y.o. female who presents today for f/u.  HYPERTENSION  Disease Monitoring  Home BP Monitoring typically 135-140/<80, occasional BPs >250 systolic Chest pain- no    Dyspnea- no Medications  Compliance-  Taking coreg, losartan, amlodipine.  Edema- chronic  Neuropathy: Patient notes in the middle the night she will wake up in her lower legs will be burning.  Occasionally occurs during the day if she stands for too long.  Lyrica is somewhat beneficial though she wonders about increasing the dose.    Social History   Tobacco Use  Smoking Status Former Smoker  Smokeless Tobacco Never Used  Tobacco Comment   1 cig every 3 months     Current Outpatient Medications on File Prior to Visit  Medication Sig Dispense Refill  . amLODipine (NORVASC) 5 MG tablet Take 1 tablet (5 mg total) by mouth daily. 90 tablet 3  . amoxicillin (AMOXIL) 500 MG capsule     . aspirin 81 MG EC tablet Take 81 mg by mouth daily.     . diphenhydrAMINE (BENADRYL) 25 MG tablet Take 25 mg by mouth daily as needed for itching, allergies or sleep.    . DULoxetine (CYMBALTA) 30 MG capsule Take 1 capsule (30 mg total) by mouth daily. 90 capsule 3  . estradiol (ESTRACE) 0.1 MG/GM vaginal cream Estrogen Cream Instruction Discard applicator Apply pea sized amount to tip of finger to urethra before bed. Wash hands well after application. Use Monday, Wednesday and Friday 42.5 g 12  . losartan (COZAAR) 100 MG tablet Take 1 tablet (100 mg total) by mouth daily. In am 90 tablet 1  . Multiple Vitamins-Minerals (MULTIVITAMIN WITH MINERALS) tablet Take 1 tablet by mouth daily.     . nitrofurantoin, macrocrystal-monohydrate, (MACROBID) 100 MG capsule nitrofurantoin monohydrate/macrocrystals 100 mg capsule    . Omega-3 Fatty Acids (OMEGA-3 FISH OIL PO) Take 1 capsule by mouth daily.     Marland Kitchen omeprazole (PRILOSEC) 20 MG capsule Take 1 capsule (20 mg total) by mouth daily as  needed. For acid reflux 90 capsule 1  . predniSONE (DELTASONE) 5 MG tablet     . rosuvastatin (CRESTOR) 10 MG tablet Take 1 tablet (10 mg total) by mouth daily. 90 tablet 1  . solifenacin (VESICARE) 10 MG tablet Take 1 tablet (10 mg total) by mouth daily. 30 tablet 11  . traZODone (DESYREL) 150 MG tablet Take 1 tablet (150 mg total) by mouth at bedtime. 90 tablet 3  . triazolam (HALCION) 0.25 MG tablet Take 0.5 mg by mouth at bedtime as needed.      No current facility-administered medications on file prior to visit.     ROS see history of present illness  Objective  Physical Exam Vitals:   11/28/20 1218  BP: 140/70  Pulse: 65  Temp: 98.5 F (36.9 C)  SpO2: 97%    BP Readings from Last 3 Encounters:  11/28/20 140/70  11/20/20 (!) 148/70  10/10/20 140/80   Wt Readings from Last 3 Encounters:  11/28/20 194 lb 12.8 oz (88.4 kg)  11/20/20 199 lb (90.3 kg)  11/03/20 196 lb (88.9 kg)    Physical Exam Constitutional:      General: She is not in acute distress.    Appearance: She is not diaphoretic.  Cardiovascular:     Rate and Rhythm: Normal rate and regular rhythm.     Heart sounds: Normal heart sounds.  Pulmonary:     Effort: Pulmonary  effort is normal.     Breath sounds: Normal breath sounds.  Skin:    General: Skin is warm and dry.  Neurological:     Mental Status: She is alert.      Assessment/Plan: Please see individual problem list.  Problem List Items Addressed This Visit    Hypertension    Still occasionally above goal.  We will increase her carvedilol to 6.25 mg twice daily.  She will continue on losartan and amlodipine.  She will return for nurse blood pressure check in 1 month.  Follow-up with me in 3 months.  Advised if she develops fatigue or heart rate less than 55 she should let us know.      Relevant Medications   carvedilol (COREG) 6.25 MG tablet   Neuropathy    Ongoing issues with this.  We will try to increase her Lyrica to 150 mg twice  daily.  If she notices any balance issues or drowsiness with this she will let us know.          This visit occurred during the SARS-CoV-2 public health emergency.  Safety protocols were in place, including screening questions prior to the visit, additional usage of staff PPE, and extensive cleaning of exam room while observing appropriate contact time as indicated for disinfecting solutions.    Monica Rumps, MD [facility]

## 2020-11-28 NOTE — Assessment & Plan Note (Signed)
Still occasionally above goal.  We will increase her carvedilol to 6.25 mg twice daily.  She will continue on losartan and amlodipine.  She will return for nurse blood pressure check in 1 month.  Follow-up with me in 3 months.  Advised if she develops fatigue or heart rate less than 55 she should let us know.

## 2020-12-08 ENCOUNTER — Other Ambulatory Visit: Payer: Self-pay

## 2020-12-08 ENCOUNTER — Ambulatory Visit
Admission: RE | Admit: 2020-12-08 | Discharge: 2020-12-08 | Disposition: A | Discharge status: Home / Self Care | Payer: Medicare Other | Source: Ambulatory Visit | Attending: Family Medicine | Admitting: Family Medicine

## 2020-12-08 DIAGNOSIS — Z1231 Encounter for screening mammogram for malignant neoplasm of breast: Secondary | ICD-10-CM | POA: Insufficient documentation

## 2020-12-28 ENCOUNTER — Ambulatory Visit: Payer: Medicare Other

## 2020-12-28 ENCOUNTER — Ambulatory Visit (INDEPENDENT_AMBULATORY_CARE_PROVIDER_SITE_OTHER): Payer: Medicare Other

## 2020-12-28 ENCOUNTER — Telehealth: Payer: Self-pay

## 2020-12-28 ENCOUNTER — Other Ambulatory Visit: Payer: Self-pay

## 2020-12-28 VITALS — BP 122/62 | HR 68

## 2020-12-28 DIAGNOSIS — I10 Essential (primary) hypertension: Secondary | ICD-10-CM | POA: Diagnosis not present

## 2020-12-28 NOTE — Progress Notes (Signed)
Patient is here for a BP check due to a medication change. See note by Dr. Caryl Bis on 11/28/2020 for order.  Currently patients BP is 122/62 and BPM is 68.  Patient has no complaints of headaches, blurry vision, chest pain, arm pain, light headedness, dizziness, and nor jaw pain. Patient states that the new medication has caused ankle swelling when standing for a long period of time and asks about a fluid pill to help relieve pressure.

## 2020-12-28 NOTE — Telephone Encounter (Signed)
Called and spoke to [place]. Mattisyn verbalized understanding and had no further questions.

## 2020-12-28 NOTE — Telephone Encounter (Signed)
-----   Message from Leone Haven, MD sent at 12/28/2020  1:59 PM EDT ----- Disregard my prior message on her BP check. BP is acceptable. The swelling is likely related to the amlodipine and not the coreg. A fluid pill will not help with this. She could try cutting the amlodipine in half to see if that is beneficial for the swelling.

## 2020-12-29 ENCOUNTER — Ambulatory Visit: Payer: Medicare Other

## 2021-01-24 ENCOUNTER — Ambulatory Visit: Payer: Self-pay | Admitting: Physician Assistant

## 2021-02-28 ENCOUNTER — Other Ambulatory Visit: Payer: Self-pay | Admitting: Family Medicine

## 2021-03-05 ENCOUNTER — Encounter: Payer: Self-pay | Admitting: Family Medicine

## 2021-03-05 ENCOUNTER — Ambulatory Visit (INDEPENDENT_AMBULATORY_CARE_PROVIDER_SITE_OTHER): Payer: Medicare Other | Admitting: Family Medicine

## 2021-03-05 ENCOUNTER — Other Ambulatory Visit: Payer: Self-pay

## 2021-03-05 VITALS — BP 130/60 | HR 66 | Temp 98.6°F | Ht 67.0 in | Wt 198.6 lb

## 2021-03-05 DIAGNOSIS — I10 Essential (primary) hypertension: Secondary | ICD-10-CM | POA: Diagnosis not present

## 2021-03-05 DIAGNOSIS — R7303 Prediabetes: Secondary | ICD-10-CM

## 2021-03-05 DIAGNOSIS — K219 Gastro-esophageal reflux disease without esophagitis: Secondary | ICD-10-CM | POA: Diagnosis not present

## 2021-03-05 DIAGNOSIS — E782 Mixed hyperlipidemia: Secondary | ICD-10-CM | POA: Diagnosis not present

## 2021-03-05 DIAGNOSIS — G629 Polyneuropathy, unspecified: Secondary | ICD-10-CM

## 2021-03-05 DIAGNOSIS — R1310 Dysphagia, unspecified: Secondary | ICD-10-CM | POA: Diagnosis not present

## 2021-03-05 DIAGNOSIS — I700 Atherosclerosis of aorta: Secondary | ICD-10-CM | POA: Diagnosis not present

## 2021-03-05 DIAGNOSIS — R0600 Dyspnea, unspecified: Secondary | ICD-10-CM

## 2021-03-05 DIAGNOSIS — R0609 Other forms of dyspnea: Secondary | ICD-10-CM

## 2021-03-05 NOTE — Assessment & Plan Note (Signed)
This would seem to be related to deconditioning and weight gain.  Prior stress testing was low risk.  If she has persistent shortness of breath or if she develops chest pain or any worsening symptoms she will be evaluated right away.  Encouraged continued walking for exercise.

## 2021-03-05 NOTE — Assessment & Plan Note (Signed)
Adequate control.  Continue amlodipine 5 mg once daily, carvedilol 6.25 mg twice daily, and losartan 100 mg daily.  Check CMP.

## 2021-03-05 NOTE — Assessment & Plan Note (Signed)
Check lipid panel.  Continue Crestor 10 mg once daily.

## 2021-03-05 NOTE — Assessment & Plan Note (Signed)
The patient will continue omeprazole.  I did discuss that Bell's palsy is not typically associated with swallowing issues.  Discussed getting a barium swallow test and swallow study.  She will be contacted to schedule this.

## 2021-03-05 NOTE — Assessment & Plan Note (Signed)
Continue risk factor management. 

## 2021-03-05 NOTE — Assessment & Plan Note (Signed)
Stable.  Continue Lyrica.

## 2021-03-05 NOTE — Patient Instructions (Signed)
Nice to see you. We will get a swallow study on you. We will get labs and contact you with the results.

## 2021-03-05 NOTE — Progress Notes (Signed)
[name], MD Phone: [phone]  Monica Larson is a 75 y.o. female who presents today for f/u.  HYPERTENSION Disease Monitoring Home BP Monitoring 130-140/60 Chest pain- no    Dyspnea- see below Medications Compliance-  taking amlodipine, coreg, losartan.  Edema- minimal if drinking soft dirnks or is up too long  Deconditioning: Patient reports some shortness of breath when she goes out to walk for exercise.  She started walking about a month ago.  No shortness of breath prior to that.  No chest pain.  She had a nuclear medicine stress test with no significant ischemia that was read as a low risk scan in February 2021.  At that time she saw cardiology and they recommended she continue a walking program for her shortness of breath that was occurring in 2021.  HYPERLIPIDEMIA Symptoms Chest pain on exertion:  no   Medications: Compliance- taking crestor Right upper quadrant pain- no  Muscle aches- no  GERD:   Reflux symptoms: was having this daily so she restarted daily omeprazole   Abd pain: no   Blood in stool: no  Dysphagia: reports some difficulty getting food to go down right since she had bell's palsy   EGD: none  Medication: omeprazole  Neuropathy: Chronic and stable.  Notes the Lyrica is beneficial.  No drowsiness with this does note she sleeps fully more frequently.  Social History   Tobacco Use  Smoking Status Former   Pack years: 0.00  Smokeless Tobacco Never  Tobacco Comments   1 cig every 3 months     Current Outpatient Medications on File Prior to Visit  Medication Sig Dispense Refill   amLODipine (NORVASC) 5 MG tablet Take 1 tablet (5 mg total) by mouth daily. 90 tablet 3   amoxicillin (AMOXIL) 500 MG capsule      aspirin 81 MG EC tablet Take 81 mg by mouth daily.      carvedilol (COREG) 6.25 MG tablet Take 1 tablet (6.25 mg total) by mouth 2 (two) times daily with a meal. 180 tablet 1   diphenhydrAMINE (BENADRYL) 25 MG tablet Take 25 mg by mouth daily  as needed for itching, allergies or sleep.     DULoxetine (CYMBALTA) 30 MG capsule Take 1 capsule (30 mg total) by mouth daily. 90 capsule 3   estradiol (ESTRACE) 0.1 MG/GM vaginal cream Estrogen Cream Instruction Discard applicator Apply pea sized amount to tip of finger to urethra before bed. Wash hands well after application. Use Monday, Wednesday and Friday 42.5 g 12   losartan (COZAAR) 100 MG tablet Take 1 tablet (100 mg total) by mouth daily. In am 90 tablet 1   Multiple Vitamins-Minerals (MULTIVITAMIN WITH MINERALS) tablet Take 1 tablet by mouth daily.      nitrofurantoin, macrocrystal-monohydrate, (MACROBID) 100 MG capsule nitrofurantoin monohydrate/macrocrystals 100 mg capsule     Omega-3 Fatty Acids (OMEGA-3 FISH OIL PO) Take 1 capsule by mouth daily.      omeprazole (PRILOSEC) 20 MG capsule Take 1 capsule (20 mg total) by mouth daily as needed. For acid reflux 90 capsule 1   predniSONE (DELTASONE) 5 MG tablet      pregabalin (LYRICA) 150 MG capsule TAKE 1 CAPSULE(150 MG) BY MOUTH TWICE DAILY 180 capsule 1   rosuvastatin (CRESTOR) 10 MG tablet Take 1 tablet (10 mg total) by mouth daily. 90 tablet 1   solifenacin (VESICARE) 10 MG tablet Take 1 tablet (10 mg total) by mouth daily. 30 tablet 11   traZODone (DESYREL) 150 MG tablet  Take 1 tablet (150 mg total) by mouth at bedtime. 90 tablet 3   triazolam (HALCION) 0.25 MG tablet Take 0.5 mg by mouth at bedtime as needed.      No current facility-administered medications on file prior to visit.     ROS see history of present illness  Objective  Physical Exam Vitals:   03/05/21 1422  BP: 130/60  Pulse: 66  Temp: 98.6 F (37 C)  SpO2: 97%    BP Readings from Last 3 Encounters:  03/05/21 130/60  12/28/20 122/62  11/28/20 140/70   Wt Readings from Last 3 Encounters:  03/05/21 198 lb 9.6 oz (90.1 kg)  11/28/20 194 lb 12.8 oz (88.4 kg)  11/20/20 199 lb (90.3 kg)    Physical Exam Constitutional:      General: She is not in  acute distress.    Appearance: She is not diaphoretic.  Cardiovascular:     Rate and Rhythm: Normal rate and regular rhythm.     Heart sounds: Normal heart sounds.  Pulmonary:     Effort: Pulmonary effort is normal.     Breath sounds: Normal breath sounds.  Skin:    General: Skin is warm and dry.  Neurological:     Mental Status: She is alert.     Assessment/Plan: Please see individual problem list.  Problem List Items Addressed This Visit     Aortic atherosclerosis ([place])    Continue risk factor management.       Dyspnea    This would seem to be related to deconditioning and weight gain.  Prior stress testing was low risk.  If she has persistent shortness of breath or if she develops chest pain or any worsening symptoms she will be evaluated right away.  Encouraged continued walking for exercise.       GERD (gastroesophageal reflux disease)    The patient will continue omeprazole.  I did discuss that Bell's palsy is not typically associated with swallowing issues.  Discussed getting a barium swallow test and swallow study.  She will be contacted to schedule this.       Hypertension    Adequate control.  Continue amlodipine 5 mg once daily, carvedilol 6.25 mg twice daily, and losartan 100 mg daily.  Check CMP.       Mixed hyperlipidemia    Check lipid panel.  Continue Crestor 10 mg once daily.       Relevant Orders   Lipid panel   Comp Met (CMET)   Neuropathy    Stable.  Continue Lyrica.       Other Visit Diagnoses     Dysphagia, unspecified type    -  Primary   Relevant Orders   SLP modified barium swallow   Prediabetes       Relevant Orders   HgB A1c       Return in about 6 months (around 09/04/2021).  This visit occurred during the SARS-CoV-2 public health emergency.  Safety protocols were in place, including screening questions prior to the visit, additional usage of staff PPE, and extensive cleaning of exam room while observing appropriate  contact time as indicated for disinfecting solutions.    [name], MD [facility]

## 2021-03-06 ENCOUNTER — Telehealth (HOSPITAL_COMMUNITY): Payer: Self-pay | Admitting: *Deleted

## 2021-03-06 LAB — COMPREHENSIVE METABOLIC PANEL
ALT: 15 U/L (ref 0–35)
AST: 18 U/L (ref 0–37)
Albumin: 4.3 g/dL (ref 3.5–5.2)
Alkaline Phosphatase: 72 U/L (ref 39–117)
BUN: 15 mg/dL (ref 6–23)
CO2: 30 mEq/L (ref 19–32)
Calcium: 9.5 mg/dL (ref 8.4–10.5)
Chloride: 102 mEq/L (ref 96–112)
Creatinine, Ser: 1.04 mg/dL (ref 0.40–1.20)
GFR: 52.41 mL/min — ABNORMAL LOW (ref >60.00)
Glucose, Bld: 104 mg/dL — ABNORMAL HIGH (ref 70–99)
Potassium: 4.3 mEq/L (ref 3.5–5.1)
Sodium: 139 mEq/L (ref 135–145)
Total Bilirubin: 0.5 mg/dL (ref 0.2–1.2)
Total Protein: 6.8 g/dL (ref 6.0–8.3)

## 2021-03-06 LAB — LIPID PANEL
Cholesterol: 144 mg/dL (ref 0–200)
HDL: 51.7 mg/dL (ref >39.00)
LDL Cholesterol: 63 mg/dL (ref 0–99)
NonHDL: 92.23
Total CHOL/HDL Ratio: 3
Triglycerides: 147 mg/dL (ref 0.0–149.0)
VLDL: 29.4 mg/dL (ref 0.0–40.0)

## 2021-03-06 LAB — HEMOGLOBIN A1C: Hgb A1c MFr Bld: 5.8 % (ref 4.6–6.5)

## 2021-03-06 NOTE — Telephone Encounter (Signed)
Monica Larson from [facility] scheduling to schedule this patient at [facility]

## 2021-03-07 ENCOUNTER — Ambulatory Visit: Payer: Medicare Other | Admitting: Family Medicine

## 2021-03-08 ENCOUNTER — Other Ambulatory Visit: Payer: Self-pay | Admitting: Family Medicine

## 2021-03-08 DIAGNOSIS — R1310 Dysphagia, unspecified: Secondary | ICD-10-CM

## 2021-03-15 ENCOUNTER — Ambulatory Visit: Payer: Medicare Other | Attending: Family Medicine

## 2021-03-16 DIAGNOSIS — M170 Bilateral primary osteoarthritis of knee: Secondary | ICD-10-CM | POA: Diagnosis not present

## 2021-04-01 ENCOUNTER — Other Ambulatory Visit: Payer: Self-pay | Admitting: Family Medicine

## 2021-04-01 DIAGNOSIS — N179 Acute kidney failure, unspecified: Secondary | ICD-10-CM

## 2021-04-06 ENCOUNTER — Other Ambulatory Visit: Payer: Self-pay

## 2021-04-06 ENCOUNTER — Other Ambulatory Visit (INDEPENDENT_AMBULATORY_CARE_PROVIDER_SITE_OTHER): Payer: Medicare Other

## 2021-04-06 ENCOUNTER — Telehealth: Payer: Self-pay

## 2021-04-06 DIAGNOSIS — N179 Acute kidney failure, unspecified: Secondary | ICD-10-CM

## 2021-04-06 NOTE — Telephone Encounter (Signed)
Pt called with BP readings.  03/18/21   150/65 03/19/21   153/76 03/20/21   129/62 03/21/21   128/61 03/22/21    111/57 03/23/21    119/51  Pt was out of town last week and did not take it while on vacation.  04/02/21  158/71 04/06/21   124/88

## 2021-04-06 NOTE — Addendum Note (Signed)
Addended by: Leeanne Rio on: 04/06/2021 01:46 PM   Modules accepted: Orders

## 2021-04-09 NOTE — Telephone Encounter (Signed)
Noted.  Some of her blood pressures are borderline low and thus this limits our ability to change her regimen.  She should continue with her current blood pressure medications.

## 2021-04-10 LAB — BASIC METABOLIC PANEL
BUN/Creatinine Ratio: 11 — ABNORMAL LOW (ref 12–28)
BUN: 11 mg/dL (ref 8–27)
CO2: 24 mmol/L (ref 20–29)
Calcium: 9.3 mg/dL (ref 8.7–10.3)
Chloride: 106 mmol/L (ref 96–106)
Creatinine, Ser: 0.97 mg/dL (ref 0.57–1.00)
Glucose: 119 mg/dL — ABNORMAL HIGH (ref 65–99)
Potassium: 4.1 mmol/L (ref 3.5–5.2)
Sodium: 142 mmol/L (ref 134–144)
eGFR: 61 mL/min/{1.73_m2} (ref >59)

## 2021-04-19 NOTE — Telephone Encounter (Signed)
Unable to leave message for patient to return call back. Phone kept ringing.

## 2021-04-24 NOTE — Telephone Encounter (Signed)
Unable to leave message

## 2021-04-26 NOTE — Telephone Encounter (Signed)
Placed call to pt. Pt was informed of provider message and will call office if her bp starts to get too low.

## 2021-04-30 ENCOUNTER — Other Ambulatory Visit: Payer: Self-pay | Admitting: Family Medicine

## 2021-04-30 DIAGNOSIS — K219 Gastro-esophageal reflux disease without esophagitis: Secondary | ICD-10-CM

## 2021-05-15 ENCOUNTER — Other Ambulatory Visit: Payer: Self-pay | Admitting: Family Medicine

## 2021-05-25 ENCOUNTER — Telehealth: Payer: Self-pay | Admitting: Family Medicine

## 2021-05-25 NOTE — Telephone Encounter (Signed)
Called to speak with [facility] to discuss her medication. Monica Larson states that she only has a few days left of her Lyrica and gabapentin and that she does not have any more refills. She states that she has contacted the pharmacy and they said they have no refills remaining for her to pick up.   Attempted to contact the pharmacy and was left on hold for 8 minutes. Call was disconnected without talking to the pharmacist.

## 2021-05-25 NOTE — Telephone Encounter (Signed)
Patient called and needs a refill on her pregabalin (LYRICA) 150 MG capsule

## 2021-05-28 NOTE — Telephone Encounter (Signed)
Called [facility] in [place] and confirmed that patient has picked up their refill of Lyrica and that they have no refills of Gabapentin on file.

## 2021-07-09 DIAGNOSIS — M170 Bilateral primary osteoarthritis of knee: Secondary | ICD-10-CM | POA: Diagnosis not present

## 2021-07-20 ENCOUNTER — Telehealth: Payer: Self-pay | Admitting: Family Medicine

## 2021-07-20 ENCOUNTER — Other Ambulatory Visit: Payer: Self-pay | Admitting: Family Medicine

## 2021-07-20 DIAGNOSIS — I10 Essential (primary) hypertension: Secondary | ICD-10-CM

## 2021-07-20 NOTE — Telephone Encounter (Signed)
Medication was sent to the pharmacy.  Newt Levingston,cma

## 2021-07-20 NOTE — Telephone Encounter (Signed)
Pt called in stating that her pharmacy is requesting a verbal approval for her medication Losartan. Pt uses [facility] in [place] [phone]

## 2021-07-23 ENCOUNTER — Other Ambulatory Visit: Payer: Self-pay | Admitting: Family Medicine

## 2021-07-24 ENCOUNTER — Other Ambulatory Visit: Payer: Self-pay

## 2021-07-24 ENCOUNTER — Encounter: Payer: Self-pay | Admitting: Family

## 2021-07-24 ENCOUNTER — Ambulatory Visit (INDEPENDENT_AMBULATORY_CARE_PROVIDER_SITE_OTHER): Payer: Medicare Other | Admitting: Family

## 2021-07-24 VITALS — BP 128/74 | HR 73 | Temp 96.5°F | Ht 67.01 in | Wt 201.4 lb

## 2021-07-24 DIAGNOSIS — J069 Acute upper respiratory infection, unspecified: Secondary | ICD-10-CM

## 2021-07-24 DIAGNOSIS — R062 Wheezing: Secondary | ICD-10-CM | POA: Diagnosis not present

## 2021-07-24 MED ORDER — PREDNISONE 10 MG (21) PO TBPK: ORAL_TABLET | ORAL | 0 refills | Status: DC

## 2021-07-24 NOTE — Progress Notes (Signed)
Acute Office Visit  Subjective:    Patient ID: Monica Larson, female    DOB: [birth year], 75 y.o.   MRN: [id number]  Chief Complaint  Patient presents with  . Cough  . Sore Throat    HPI Patient is in today with c/o cough, scratchy throat, hoarseness, and wheezing x 3 days. Cough is productive with clear phlegm. Has been taking an allergy medication, cough medication that has not helped much.   Past Medical History:  Diagnosis Date  . Arthritis    HANDS/NECK/SHOULDERS  . Asthma    SEASONAL  . Diarrhea    INTERMITTENT CHRONIC  . Fatigue   . GERD (gastroesophageal reflux disease)   . H/O motion sickness 06/17/2016  . Headache    SINUS/ ALLERGIES  . HLD (hyperlipidemia)    borderline  . HTN (hypertension)    CONTROLLED ON MEDS  . Motion sickness   . Seasonal allergies   . Shortness of breath dyspnea    ON EXERTION    Past Surgical History:  Procedure Laterality Date  . BREAST BIOPSY Bilateral    NEG  . BREAST EXCISIONAL BIOPSY Bilateral    NEG  . CARPAL TUNNEL RELEASE     right wrist  . CHOLECYSTECTOMY    . COLONOSCOPY N/A 01/31/2016   Procedure: COLONOSCOPY;  Surgeon: Hulen Luster, MD;  Location: [place];  Service: Gastroenterology;  Laterality: N/A;  . EYE SURGERY     muscular as child/ CATARACT BILATERAL  . SKIN CANCER EXCISION Right Wrist and thigh (x2)   Unsure of dates and Dermatologist  . VESICOVAGINAL FISTULA CLOSURE W/ TAH      Family History  Problem Relation Age of Onset  . Ovarian cancer Mother   . Prostate cancer Father   . Coronary artery disease Father   . Diabetes Father   . Heart attack Father   . Heart attack Brother   . Breast cancer Neg Hx     Social History   Socioeconomic History  . Marital status: Married    Spouse name: Not on file  . Number of children: Not on file  . Years of education: Not on file  . Highest education level: Not on file  Occupational History  . Not on file  Tobacco Use  . Smoking status:  Former  . Smokeless tobacco: Never  . Tobacco comments:    1 cig every 3 months   Vaping Use  . Vaping Use: Never used  Substance and Sexual Activity  . Alcohol use: Yes    Comment: 1 MIXED DRINK/WEEK  . Drug use: No  . Sexual activity: Not on file  Other Topics Concern  . 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    Social Determinants of Health   Financial Resource Strain: Low Risk   . Difficulty of Paying Living Expenses: Not hard at all  Food Insecurity: No Food Insecurity  . Worried About Charity fundraiser in the Last Year: Never true  . Ran Out of Food in the Last Year: Never true  Transportation Needs: No Transportation Needs  . Lack of Transportation (Medical): No  . Lack of Transportation (Non-Medical): No  Physical Activity: Not on file  Stress: No Stress Concern Present  . Feeling of Stress : Not at all  Social Connections: Unknown  . Frequency of Communication with Friends and Family: More than three times a week  . Frequency of Social Gatherings  with Friends and Family: More than three times a week  . Attends Religious Services: Not on file  . Active Member of Clubs or Organizations: Not on file  . Attends Banker Meetings: Not on file  . Marital Status: Married  Catering manager Violence: Not At Risk  . Fear of Current or Ex-Partner: No  . Emotionally Abused: No  . Physically Abused: No  . Sexually Abused: No    Outpatient Medications Prior to Visit  Medication Sig Dispense Refill  . amLODipine (NORVASC) 5 MG tablet Take 1 tablet (5 mg total) by mouth daily. 90 tablet 3  . amoxicillin (AMOXIL) 500 MG capsule     . aspirin 81 MG EC tablet Take 81 mg by mouth daily.     . carvedilol (COREG) 6.25 MG tablet TAKE 1 TABLET(6.25 MG) BY MOUTH TWICE DAILY WITH A MEAL 180 tablet 1  . diphenhydrAMINE (BENADRYL) 25 MG tablet Take 25 mg by mouth daily as needed for itching, allergies or sleep.    .  DULoxetine (CYMBALTA) 30 MG capsule TAKE 1 CAPSULE(30 MG) BY MOUTH DAILY 90 capsule 3  . estradiol (ESTRACE) 0.1 MG/GM vaginal cream Estrogen Cream Instruction Discard applicator Apply pea sized amount to tip of finger to urethra before bed. Wash hands well after application. Use Monday, Wednesday and Friday 42.5 g 12  . losartan (COZAAR) 100 MG tablet TAKE 1 TABLET(100 MG) BY MOUTH DAILY IN THE MORNING 90 tablet 1  . Multiple Vitamins-Minerals (MULTIVITAMIN WITH MINERALS) tablet Take 1 tablet by mouth daily.     . nitrofurantoin, macrocrystal-monohydrate, (MACROBID) 100 MG capsule nitrofurantoin monohydrate/macrocrystals 100 mg capsule    . Omega-3 Fatty Acids (OMEGA-3 FISH OIL PO) Take 1 capsule by mouth daily.     Marland Kitchen omeprazole (PRILOSEC) 20 MG capsule TAKE ONE CAPSULE BY MOUTH DAILY AS NEEDED FOR ACID REFLUX 90 capsule 1  . pregabalin (LYRICA) 150 MG capsule TAKE 1 CAPSULE(150 MG) BY MOUTH TWICE DAILY 180 capsule 1  . rosuvastatin (CRESTOR) 10 MG tablet Take 1 tablet (10 mg total) by mouth daily. 90 tablet 1  . solifenacin (VESICARE) 10 MG tablet Take 1 tablet (10 mg total) by mouth daily. 30 tablet 11  . traZODone (DESYREL) 150 MG tablet Take 1 tablet (150 mg total) by mouth at bedtime. 90 tablet 3  . triazolam (HALCION) 0.25 MG tablet Take 0.5 mg by mouth at bedtime as needed.     . predniSONE (DELTASONE) 5 MG tablet      No facility-administered medications prior to visit.    Allergies  Allergen Reactions  . Lisinopril     Cough   . Shellfish Allergy   . Sulfa Antibiotics     hives  . Sulfonamide Derivatives     Review of Systems  Constitutional:  Negative for chills and fever.  HENT:  Positive for congestion, postnasal drip and sore throat.   Respiratory:  Positive for cough and wheezing. Negative for shortness of breath.   Cardiovascular: Negative.   Musculoskeletal: Negative.   Allergic/Immunologic: Negative.   Neurological: Negative.   Psychiatric/Behavioral: Negative.     All other systems reviewed and are negative.     Objective:    Physical Exam Vitals and nursing note reviewed.  Constitutional:      Appearance: She is well-developed.  HENT:     Right Ear: Tympanic membrane normal.     Left Ear: Tympanic membrane normal.     Nose: Congestion present.     Mouth/Throat:  Mouth: Mucous membranes are moist.     Pharynx: No oropharyngeal exudate or posterior oropharyngeal erythema.  Cardiovascular:     Rate and Rhythm: Normal rate and regular rhythm.  Pulmonary:     Effort: Pulmonary effort is normal.     Breath sounds: Normal breath sounds. No wheezing.  Musculoskeletal:     Cervical back: Normal range of motion and neck supple.  Skin:    General: Skin is warm and dry.  Neurological:     General: No focal deficit present.     Mental Status: She is alert and oriented to person, place, and time.   BP 128/74   Pulse 73   Temp (!) 96.5 F (35.8 C)   Ht 5' 7.01" (1.702 m)   Wt 201 lb 6.4 oz (91.4 kg)   SpO2 96%   BMI 31.54 kg/m  Wt Readings from Last 3 Encounters:  07/24/21 201 lb 6.4 oz (91.4 kg)  03/05/21 198 lb 9.6 oz (90.1 kg)  11/28/20 194 lb 12.8 oz (88.4 kg)    Health Maintenance Due  Topic Date Due  . COVID-19 Vaccine (4 - Booster for [place] series) 06/30/2020  . INFLUENZA VACCINE  04/09/2021    There are no preventive care reminders to display for this patient.   Lab Results  Component Value Date   TSH 0.89 11/04/2019   Lab Results  Component Value Date   WBC 4.2 11/04/2019   HGB 14.3 11/04/2019   HCT 43.0 11/04/2019   MCV 95.2 11/04/2019   PLT 167.0 11/04/2019   Lab Results  Component Value Date   NA 142 04/06/2021   K 4.1 04/06/2021   CO2 24 04/06/2021   GLUCOSE 119 (H) 04/06/2021   BUN 11 04/06/2021   CREATININE 0.97 04/06/2021   BILITOT 0.5 03/05/2021   ALKPHOS 72 03/05/2021   AST 18 03/05/2021   ALT 15 03/05/2021   PROT 6.8 03/05/2021   ALBUMIN 4.3 03/05/2021   CALCIUM 9.3 04/06/2021    ANIONGAP 8 12/10/2017   EGFR 61 04/06/2021   GFR 52.41 (L) 03/05/2021   Lab Results  Component Value Date   CHOL 144 03/05/2021   Lab Results  Component Value Date   HDL 51.70 03/05/2021   Lab Results  Component Value Date   LDLCALC 63 03/05/2021   Lab Results  Component Value Date   TRIG 147.0 03/05/2021   Lab Results  Component Value Date   CHOLHDL 3 03/05/2021   Lab Results  Component Value Date   HGBA1C 5.8 03/05/2021       Assessment & Plan:   Problem List Items Addressed This Visit   None Visit Diagnoses     Viral upper respiratory infection    -  Primary   Wheezing            Meds ordered this encounter  Medications  . predniSONE (STERAPRED UNI-PAK 21 TAB) 10 MG (21) TBPK tablet    Sig: As directed    Dispense:  21 tablet    Refill:  0     Kennyth Arnold, FNP

## 2021-07-26 DIAGNOSIS — M25561 Pain in right knee: Secondary | ICD-10-CM | POA: Diagnosis not present

## 2021-07-26 DIAGNOSIS — M25562 Pain in left knee: Secondary | ICD-10-CM | POA: Diagnosis not present

## 2021-07-26 DIAGNOSIS — M170 Bilateral primary osteoarthritis of knee: Secondary | ICD-10-CM | POA: Diagnosis not present

## 2021-07-27 ENCOUNTER — Telehealth: Payer: Self-pay | Admitting: Family Medicine

## 2021-07-27 NOTE — Telephone Encounter (Signed)
Pt called in regards to cough and pain in her chest she is currently having. Pt was seen in office on 11/15 at 1pm with Padonda. Pt was prescribed prednisone. Pt called in today stating she still feels like she can't breathe and feeling pain in her chest. Pt is coughing uncontrollably and pt has not had a fever. Pt was scheduled to see Dr. Caryl Bis 11/21 at 10:30. Pt was also sent to access nurse.

## 2021-07-27 NOTE — Telephone Encounter (Signed)
Pt was wondering if she could be prescribed something else in the meantime

## 2021-07-30 ENCOUNTER — Ambulatory Visit (INDEPENDENT_AMBULATORY_CARE_PROVIDER_SITE_OTHER): Payer: Medicare Other | Admitting: Family Medicine

## 2021-07-30 ENCOUNTER — Other Ambulatory Visit: Payer: Self-pay

## 2021-07-30 ENCOUNTER — Encounter: Payer: Self-pay | Admitting: Family Medicine

## 2021-07-30 ENCOUNTER — Other Ambulatory Visit: Payer: Self-pay | Admitting: Family Medicine

## 2021-07-30 VITALS — BP 125/65 | HR 66 | Temp 98.1°F | Ht 67.0 in | Wt 201.8 lb

## 2021-07-30 DIAGNOSIS — J40 Bronchitis, not specified as acute or chronic: Secondary | ICD-10-CM

## 2021-07-30 LAB — POC COVID19 BINAXNOW: SARS Coronavirus 2 Ag: NEGATIVE

## 2021-07-30 MED ORDER — HYDROCOD POLST-CPM POLST ER 10-8 MG/5ML PO SUER: 5.0000 mL | Freq: Two times a day (BID) | ORAL | 0 refills | Status: DC | PRN

## 2021-07-30 MED ORDER — AZITHROMYCIN 250 MG PO TABS: ORAL_TABLET | ORAL | 0 refills | Status: AC

## 2021-07-30 MED ORDER — ALBUTEROL SULFATE HFA 108 (90 BASE) MCG/ACT IN AERS: 2.0000 | INHALATION_SPRAY | Freq: Four times a day (QID) | RESPIRATORY_TRACT | 0 refills | Status: DC | PRN

## 2021-07-30 NOTE — Telephone Encounter (Signed)
I already sent this in with the requested quantity.

## 2021-07-30 NOTE — Telephone Encounter (Signed)
Patient was seen today.  Monica Larson,cma

## 2021-07-30 NOTE — Assessment & Plan Note (Signed)
The patient symptoms are consistent with bronchitis.  At this point she would benefit from an antibiotic.  Azithromycin will be sent in.  We will also provide her with Tussionex as a cough suppressant.  Discussed this does have a narcotic in it and she would need to monitor for drowsiness with this.  If she is excessively drowsy she will discontinue the medication.  She will not drive while taking medication.  I also sent in an albuterol inhaler.  If she develops chest pain, shortness of breath, or cough productive of blood she will seek medical attention.  If she is not improving after the antibiotic I would consider chest x-ray.

## 2021-07-30 NOTE — Patient Instructions (Signed)
Nice to see you. You likely have bronchitis. We will treat you with azithromycin.  I also sent in a cough suppressant called Tussionex.  This could make you drowsy.  If it makes you excessively drowsy please discontinue it and let us know.  Please do not drive while taking this. If you develop chest pain, shortness of breath, or cough productive of blood please seek medical attention in the emergency room.  If you are not improving in the next week please let me know.

## 2021-07-30 NOTE — Addendum Note (Signed)
Addended by: Caryl Bis, Jackye Dever G on: 07/30/2021 11:55 AM   Modules accepted: Orders

## 2021-07-30 NOTE — Addendum Note (Signed)
Addended by: Baron Hamper on: 07/30/2021 10:24 AM   Modules accepted: Orders

## 2021-07-30 NOTE — Progress Notes (Signed)
Monica Rumps, MD Phone: [phone]  Monica Larson Risk is a 75 y.o. female who presents today for f/u.  Respiratory illness: Patient reports onset of cough 2 weeks ago.  She feels chest congestion as well.  Her breathing has improved somewhat since last week when she was placed on a steroid Dosepak.  She notes some discomfort only when coughing in her chest.  Some postnasal drip.  Some change in taste.  She reports a negative point-of-care COVID test last week through our office.  No COVID exposure.  No flu exposure.  She has been using over-the-counter cough suppressants and Mucinex.  She does feel mildly better compared to last week.  Social History   Tobacco Use  Smoking Status Former  Smokeless Tobacco Never  Tobacco Comments   1 cig every 3 months     Current Outpatient Medications on File Prior to Visit  Medication Sig Dispense Refill   amLODipine (NORVASC) 5 MG tablet Take 1 tablet (5 mg total) by mouth daily. 90 tablet 3   amoxicillin (AMOXIL) 500 MG capsule      aspirin 81 MG EC tablet Take 81 mg by mouth daily.      carvedilol (COREG) 6.25 MG tablet TAKE 1 TABLET(6.25 MG) BY MOUTH TWICE DAILY WITH A MEAL 180 tablet 1   diphenhydrAMINE (BENADRYL) 25 MG tablet Take 25 mg by mouth daily as needed for itching, allergies or sleep.     DULoxetine (CYMBALTA) 30 MG capsule TAKE 1 CAPSULE(30 MG) BY MOUTH DAILY 90 capsule 3   estradiol (ESTRACE) 0.1 MG/GM vaginal cream Estrogen Cream Instruction Discard applicator Apply pea sized amount to tip of finger to urethra before bed. Wash hands well after application. Use Monday, Wednesday and Friday 42.5 g 12   losartan (COZAAR) 100 MG tablet TAKE 1 TABLET(100 MG) BY MOUTH DAILY IN THE MORNING 90 tablet 1   Multiple Vitamins-Minerals (MULTIVITAMIN WITH MINERALS) tablet Take 1 tablet by mouth daily.      nitrofurantoin, macrocrystal-monohydrate, (MACROBID) 100 MG capsule nitrofurantoin monohydrate/macrocrystals 100 mg capsule     Omega-3 Fatty  Acids (OMEGA-3 FISH OIL PO) Take 1 capsule by mouth daily.      omeprazole (PRILOSEC) 20 MG capsule TAKE ONE CAPSULE BY MOUTH DAILY AS NEEDED FOR ACID REFLUX 90 capsule 1   predniSONE (STERAPRED UNI-PAK 21 TAB) 10 MG (21) TBPK tablet As directed 21 tablet 0   pregabalin (LYRICA) 150 MG capsule TAKE 1 CAPSULE(150 MG) BY MOUTH TWICE DAILY 180 capsule 1   rosuvastatin (CRESTOR) 10 MG tablet Take 1 tablet (10 mg total) by mouth daily. 90 tablet 1   solifenacin (VESICARE) 10 MG tablet Take 1 tablet (10 mg total) by mouth daily. 30 tablet 11   traZODone (DESYREL) 150 MG tablet Take 1 tablet (150 mg total) by mouth at bedtime. 90 tablet 3   triazolam (HALCION) 0.25 MG tablet Take 0.5 mg by mouth at bedtime as needed.      No current facility-administered medications on file prior to visit.     ROS see history of present illness  Objective  Physical Exam Vitals:   07/30/21 1026  BP: 125/65  Pulse: 66  Temp: 98.1 F (36.7 C)  SpO2: 97%    BP Readings from Last 3 Encounters:  07/30/21 125/65  07/24/21 128/74  03/05/21 130/60   Wt Readings from Last 3 Encounters:  07/30/21 201 lb 12.8 oz (91.5 kg)  07/24/21 201 lb 6.4 oz (91.4 kg)  03/05/21 198 lb 9.6 oz (90.1 kg)  Physical Exam Constitutional:      General: She is not in acute distress.    Appearance: She is not diaphoretic.  Cardiovascular:     Rate and Rhythm: Normal rate and regular rhythm.     Heart sounds: Normal heart sounds.  Pulmonary:     Effort: Pulmonary effort is normal.     Breath sounds: Normal breath sounds.  Lymphadenopathy:     Cervical: No cervical adenopathy.  Skin:    General: Skin is warm and dry.  Neurological:     Mental Status: She is alert.     Assessment/Plan: Please see individual problem list.  Problem List Items Addressed This Visit     Bronchitis - Primary    The patient symptoms are consistent with bronchitis.  At this point she would benefit from an antibiotic.  Azithromycin will  be sent in.  We will also provide her with Tussionex as a cough suppressant.  Discussed this does have a narcotic in it and she would need to monitor for drowsiness with this.  If she is excessively drowsy she will discontinue the medication.  She will not drive while taking medication.  I also sent in an albuterol inhaler.  If she develops chest pain, shortness of breath, or cough productive of blood she will seek medical attention.  If she is not improving after the antibiotic I would consider chest x-ray.      Relevant Medications   azithromycin (ZITHROMAX) 250 MG tablet   chlorpheniramine-HYDROcodone (TUSSIONEX PENNKINETIC ER) 10-8 MG/5ML SUER   albuterol (VENTOLIN HFA) 108 (90 Base) MCG/ACT inhaler     Return if symptoms worsen or fail to improve.  This visit occurred during the SARS-CoV-2 public health emergency.  Safety protocols were in place, including screening questions prior to the visit, additional usage of staff PPE, and extensive cleaning of exam room while observing appropriate contact time as indicated for disinfecting solutions.    Monica Rumps, MD [place]

## 2021-07-30 NOTE — Telephone Encounter (Signed)
The pharmacy is needing the quantity of this medication to be clarified. Per their note it must be 70 mL per 7 days

## 2021-07-30 NOTE — Progress Notes (Signed)
The pharmacy called and spoke with Gae Bon. They noted they needed the quantity changed on the tussionex to 70 mL as they can not open the bottle to provide a smaller amount. This was changed.

## 2021-07-30 NOTE — Telephone Encounter (Signed)
Pharmacist called form [facility] and stated that they need a new RX for Tussonex to be sent for 67 ML because the bottle that they have states they cannot open to  dispense, this was sent to the provider and he sent in a new Rx.  Philippa Vessey,cma

## 2021-08-27 ENCOUNTER — Other Ambulatory Visit: Payer: Self-pay

## 2021-08-27 ENCOUNTER — Ambulatory Visit (INDEPENDENT_AMBULATORY_CARE_PROVIDER_SITE_OTHER): Payer: Medicare Other | Admitting: Family Medicine

## 2021-08-27 ENCOUNTER — Encounter: Payer: Self-pay | Admitting: Family Medicine

## 2021-08-27 DIAGNOSIS — G629 Polyneuropathy, unspecified: Secondary | ICD-10-CM | POA: Diagnosis not present

## 2021-08-27 DIAGNOSIS — M7061 Trochanteric bursitis, right hip: Secondary | ICD-10-CM

## 2021-08-27 DIAGNOSIS — E782 Mixed hyperlipidemia: Secondary | ICD-10-CM | POA: Diagnosis not present

## 2021-08-27 DIAGNOSIS — F4323 Adjustment disorder with mixed anxiety and depressed mood: Secondary | ICD-10-CM

## 2021-08-27 DIAGNOSIS — M7062 Trochanteric bursitis, left hip: Secondary | ICD-10-CM

## 2021-08-27 DIAGNOSIS — K219 Gastro-esophageal reflux disease without esophagitis: Secondary | ICD-10-CM | POA: Diagnosis not present

## 2021-08-27 DIAGNOSIS — M722 Plantar fascial fibromatosis: Secondary | ICD-10-CM

## 2021-08-27 DIAGNOSIS — I10 Essential (primary) hypertension: Secondary | ICD-10-CM

## 2021-08-27 DIAGNOSIS — J989 Respiratory disorder, unspecified: Secondary | ICD-10-CM

## 2021-08-27 LAB — BASIC METABOLIC PANEL
BUN: 10 mg/dL (ref 6–23)
CO2: 29 mEq/L (ref 19–32)
Calcium: 9.2 mg/dL (ref 8.4–10.5)
Chloride: 105 mEq/L (ref 96–112)
Creatinine, Ser: 0.71 mg/dL (ref 0.40–1.20)
GFR: 82.59 mL/min (ref >60.00)
Glucose, Bld: 113 mg/dL — ABNORMAL HIGH (ref 70–99)
Potassium: 3.8 mEq/L (ref 3.5–5.1)
Sodium: 140 mEq/L (ref 135–145)

## 2021-08-27 MED ORDER — CARVEDILOL 6.25 MG PO TABS: ORAL_TABLET | ORAL | 1 refills | Status: DC

## 2021-08-27 MED ORDER — AMLODIPINE BESYLATE 5 MG PO TABS
5.0000 mg | ORAL_TABLET | Freq: Every day | ORAL | 3 refills | Status: DC
Start: 2021-08-27 — End: 2022-12-10

## 2021-08-27 MED ORDER — LOSARTAN POTASSIUM 100 MG PO TABS: ORAL_TABLET | ORAL | 1 refills | Status: DC

## 2021-08-27 MED ORDER — ROSUVASTATIN CALCIUM 10 MG PO TABS
10.0000 mg | ORAL_TABLET | Freq: Every day | ORAL | 1 refills | Status: DC
Start: 2021-08-27 — End: 2021-08-27

## 2021-08-27 MED ORDER — PREGABALIN 100 MG PO CAPS
100.0000 mg | ORAL_CAPSULE | Freq: Two times a day (BID) | ORAL | 2 refills | Status: DC
Start: 2021-08-27 — End: 2021-10-29

## 2021-08-27 MED ORDER — AMLODIPINE BESYLATE 5 MG PO TABS: 5.0000 mg | ORAL_TABLET | Freq: Every day | ORAL | 3 refills | Status: DC

## 2021-08-27 MED ORDER — OMEPRAZOLE 20 MG PO CPDR: DELAYED_RELEASE_CAPSULE | ORAL | 1 refills | Status: DC

## 2021-08-27 MED ORDER — ROSUVASTATIN CALCIUM 10 MG PO TABS
10.0000 mg | ORAL_TABLET | Freq: Every day | ORAL | 1 refills | Status: DC
Start: 2021-08-27 — End: 2021-10-29

## 2021-08-27 MED ORDER — DULOXETINE HCL 60 MG PO CPEP: 60.0000 mg | ORAL_CAPSULE | Freq: Every day | ORAL | 3 refills | Status: DC

## 2021-08-27 NOTE — Assessment & Plan Note (Signed)
Adequately controlled.  She will continue losartan 100 mg once daily, carvedilol 6.25 mg twice daily, and amlodipine 5 mg once daily.  Check BMP.

## 2021-08-27 NOTE — Assessment & Plan Note (Signed)
>>  ASSESSMENT AND PLAN FOR ADJUSTMENT DISORDER WITH MIXED ANXIETY AND DEPRESSED MOOD WRITTEN ON 08/27/2021  1:32 PM BY SONNENBERG, ERIC G, MD  She does still have some anxiety.  No significant depression.  We will increase her Cymbalta  to 60 mg daily.  Discussed this may be beneficial for her neuropathy as well.

## 2021-08-27 NOTE — Assessment & Plan Note (Signed)
Symptoms and exam are consistent with Planter fasciitis.  Discussed using a frozen water bottle and rolling her foot for 10 minutes 3 times a day.  Discussed stretching.  Advised if this is not improving over the next several weeks she should let us know and we can refer her to podiatry.

## 2021-08-27 NOTE — Assessment & Plan Note (Addendum)
Chronic issue.  Generally well controlled.  She is having some side effects with the Lyrica and thus we will decrease the dose to Lyrica 100 mg twice daily.  Discussed the potential for decreasing the dose further if she does not have resolution of her side effect symptoms.  If her neuropathy worsens I discussed the need to see neurology for further management of this issue.  Message sent to [facility] to contact the patient in 2 weeks to see how she is doing with this dose reduction.

## 2021-08-27 NOTE — Patient Instructions (Addendum)
Nice to see you. I sent refills in for you. We are going to reduce your Lyrica dose to 100 mg twice daily to see if that helps reduce your side effects symptoms.  If your neuropathy worsens with this dose reduction please let me know and we can have you see neurology. Please do the icing that we discussed with the water bottle for 10 minutes 3 times daily.  Please also do the stretching for the planter fasciitis.  If this is not improving in the next 2 weeks please let me know and we can have you see podiatry. If your cough does not resolve over the next week please let me know and we need to get a chest x-ray. We will increase her Cymbalta dose to 60 mg once daily.  Hip Bursitis Rehab Ask your health care provider which exercises are safe for you. Do exercises exactly as told by your health care provider and adjust them as directed. It is normal to feel mild stretching, pulling, tightness, or discomfort as you do these exercises. Stop right away if you feel sudden pain or your pain gets worse. Do not begin these exercises until told by your health care provider. Stretching exercise This exercise warms up your muscles and joints and improves the movement and flexibility of your hip. This exercise also helps to relieve pain and stiffness. Iliotibial band stretch An iliotibial band is a strong band of muscle tissue that runs from the outer side of your hip to the outer side of your thigh and knee. Lie on your side with your left / right leg in the top position. Bend your left / right knee and grab your ankle. Stretch out your bottom arm to help you balance. Slowly bring your knee back so your thigh is behind your body. Slowly lower your knee toward the floor until you feel a gentle stretch on the outside of your left / right thigh. If you do not feel a stretch and your knee will not fall farther, place the heel of your other foot on top of your knee and pull your knee down toward the floor with your  foot. Hold this position for __________ seconds. Slowly return to the starting position. Repeat __________ times. Complete this exercise __________ times a day. Strengthening exercises These exercises build strength and endurance in your hip and pelvis. Endurance is the ability to use your muscles for a long time, even after they get tired. Bridge This exercise strengthens the muscles that move your thigh backward (hip extensors). Lie on your back on a firm surface with your knees bent and your feet flat on the floor. Tighten your buttocks muscles and lift your buttocks off the floor until your trunk is level with your thighs. Do not arch your back. You should feel the muscles working in your buttocks and the back of your thighs. If you do not feel these muscles, slide your feet 1-2 inches (2.5-5 cm) farther away from your buttocks. If this exercise is too easy, try doing it with your arms crossed over your chest. Hold this position for __________ seconds. Slowly lower your hips to the starting position. Let your muscles relax completely after each repetition. Repeat __________ times. Complete this exercise __________ times a day. Squats This exercise strengthens the muscles in front of your thigh and knee (quadriceps). Stand in front of a table, with your feet and knees pointing straight ahead. You may rest your hands on the table for balance but not  for support. Slowly bend your knees and lower your hips like you are going to sit in a chair. Keep your weight over your heels, not over your toes. Keep your lower legs upright so they are parallel with the table legs. Do not let your hips go lower than your knees. Do not bend lower than told by your health care provider. If your hip pain increases, do not bend as low. Hold the squat position for __________ seconds. Slowly push with your legs to return to standing. Do not use your hands to pull yourself to standing. Repeat __________ times.  Complete this exercise __________ times a day. Hip hike Stand sideways on a bottom step. Stand on your left / right leg with your other foot unsupported next to the step. You can hold on to the railing or wall for balance if needed. Keep your knees straight and your torso square. Then lift your left / right hip up toward the ceiling. Hold this position for __________ seconds. Slowly let your left / right hip lower toward the floor, past the starting position. Your foot should get closer to the floor. Do not lean or bend your knees. Repeat __________ times. Complete this exercise __________ times a day. Single leg stand Without shoes, stand near a railing or in a doorway. You may hold on to the railing or door frame as needed for balance. Squeeze your left / right buttock muscles, then lift up your other foot. Do not let your left / right hip push out to the side. It is helpful to stand in front of a mirror for this exercise so you can watch your hip. Hold this position for __________ seconds. Repeat __________ times. Complete this exercise __________ times a day. This information is not intended to replace advice given to you by your health care provider. Make sure you discuss any questions you have with your health care provider. Document Revised: 12/21/2018 Document Reviewed: 12/21/2018 Elsevier Patient Education  [place].

## 2021-08-27 NOTE — Assessment & Plan Note (Signed)
Discussed that this is likely the cause of her hip pain.  She was provided with exercises to complete at home.  If this not improving we can refer her to orthopedics to consider injections.

## 2021-08-27 NOTE — Assessment & Plan Note (Signed)
She does still have some anxiety.  No significant depression.  We will increase her Cymbalta to 60 mg daily.  Discussed this may be beneficial for her neuropathy as well.

## 2021-08-27 NOTE — Assessment & Plan Note (Signed)
The patient has significantly improved.  I expect her cough will resolve over the next week or so.  Discussed that if it does not she should let us know and we will get a chest x-ray.

## 2021-08-27 NOTE — Progress Notes (Signed)
Monica Rumps, MD Phone: [phone]  Monica Larson is a 75 y.o. female who presents today for f/u.  HYPERTENSION Disease Monitoring Home BP Monitoring not checking Chest pain- no    Dyspnea- no Medications Compliance-  taking amlodipine, coreg, losartan.  BMET    Component Value Date/Time   NA 142 04/06/2021 1416   K 4.1 04/06/2021 1416   CL 106 04/06/2021 1416   CO2 24 04/06/2021 1416   GLUCOSE 119 (H) 04/06/2021 1416   GLUCOSE 104 (H) 03/05/2021 1453   BUN 11 04/06/2021 1416   CREATININE 0.97 04/06/2021 1416   CALCIUM 9.3 04/06/2021 1416   GFRNONAA 58 (L) 12/10/2017 1750   GFRAA >60 12/10/2017 1750   Anxiety/depression: notes this is generally improved, though still has some anxiety.  No panic attacks.  No significant depression.  She is on Cymbalta 30 mg once daily.  She does take trazodone.  No SI.  Neuropathy: Patient notes that Lyrica is beneficial although it makes her seem like she is drunk at times.  She tried not taking it at night 1 time and had significant neuropathy symptoms.  Planter fasciitis: Patient believes she has Planter fasciitis in the right foot.  This has been going on 2 weeks.  No injury.  She notes it is significantly painful when she first gets up in the morning and improves throughout the day.  Upper respiratory illness: Patient has recovered quite well from this.  She still has some intermittent coughing fits though is significantly better.  Bilateral hip pain: Patient notes these hurt only when she lays on them at night.  Social History   Tobacco Use  Smoking Status Former  Smokeless Tobacco Never  Tobacco Comments   1 cig every 3 months     Current Outpatient Medications on File Prior to Visit  Medication Sig Dispense Refill   albuterol (VENTOLIN HFA) 108 (90 Base) MCG/ACT inhaler Inhale 2 puffs into the lungs every 6 (six) hours as needed for wheezing or shortness of breath. 8 g 0   amoxicillin (AMOXIL) 500 MG capsule      aspirin 81  MG EC tablet Take 81 mg by mouth daily.      chlorpheniramine-HYDROcodone (TUSSIONEX PENNKINETIC ER) 10-8 MG/5ML SUER Take 5 mLs by mouth every 12 (twelve) hours as needed for cough. 70 mL 0   diphenhydrAMINE (BENADRYL) 25 MG tablet Take 25 mg by mouth daily as needed for itching, allergies or sleep.     estradiol (ESTRACE) 0.1 MG/GM vaginal cream Estrogen Cream Instruction Discard applicator Apply pea sized amount to tip of finger to urethra before bed. Wash hands well after application. Use Monday, Wednesday and Friday 42.5 g 12   Multiple Vitamins-Minerals (MULTIVITAMIN WITH MINERALS) tablet Take 1 tablet by mouth daily.      nitrofurantoin, macrocrystal-monohydrate, (MACROBID) 100 MG capsule nitrofurantoin monohydrate/macrocrystals 100 mg capsule     Omega-3 Fatty Acids (OMEGA-3 FISH OIL PO) Take 1 capsule by mouth daily.      predniSONE (STERAPRED UNI-PAK 21 TAB) 10 MG (21) TBPK tablet As directed 21 tablet 0   solifenacin (VESICARE) 10 MG tablet Take 1 tablet (10 mg total) by mouth daily. 30 tablet 11   traZODone (DESYREL) 150 MG tablet Take 1 tablet (150 mg total) by mouth at bedtime. 90 tablet 3   triazolam (HALCION) 0.25 MG tablet Take 0.5 mg by mouth at bedtime as needed.      No current facility-administered medications on file prior to visit.  ROS see history of present illness  Objective  Physical Exam Vitals:   08/27/21 1312  BP: 125/80  Pulse: 65  Temp: 98 F (36.7 C)  SpO2: 98%    BP Readings from Last 3 Encounters:  08/27/21 125/80  07/30/21 125/65  07/24/21 128/74   Wt Readings from Last 3 Encounters:  08/27/21 205 lb 12.8 oz (93.4 kg)  07/30/21 201 lb 12.8 oz (91.5 kg)  07/24/21 201 lb 6.4 oz (91.4 kg)    Physical Exam Constitutional:      General: She is not in acute distress.    Appearance: She is not diaphoretic.  Cardiovascular:     Rate and Rhythm: Normal rate and regular rhythm.     Heart sounds: Normal heart sounds.  Pulmonary:     Effort:  Pulmonary effort is normal.     Breath sounds: Normal breath sounds.  Musculoskeletal:     Comments: Tenderness of the plantar fascia towards the heel of her right foot, the patient has hip tenderness over her bilateral greater trochanters  Skin:    General: Skin is warm and dry.  Neurological:     Mental Status: She is alert.     Assessment/Plan: Please see individual problem list.  Problem List Items Addressed This Visit     Adjustment disorder with mixed anxiety and depressed mood    She does still have some anxiety.  No significant depression.  We will increase her Cymbalta to 60 mg daily.  Discussed this may be beneficial for her neuropathy as well.      Relevant Medications   DULoxetine (CYMBALTA) 60 MG capsule   GERD (gastroesophageal reflux disease)   Relevant Medications   omeprazole (PRILOSEC) 20 MG capsule   Hypertension    Adequately controlled.  She will continue losartan 100 mg once daily, carvedilol 6.25 mg twice daily, and amlodipine 5 mg once daily.  Check BMP.      Relevant Medications   carvedilol (COREG) 6.25 MG tablet   losartan (COZAAR) 100 MG tablet   amLODipine (NORVASC) 5 MG tablet   rosuvastatin (CRESTOR) 10 MG tablet   Mixed hyperlipidemia   Relevant Medications   carvedilol (COREG) 6.25 MG tablet   losartan (COZAAR) 100 MG tablet   amLODipine (NORVASC) 5 MG tablet   rosuvastatin (CRESTOR) 10 MG tablet   Neuropathy    Chronic issue.  Generally well controlled.  She is having some side effects with the Lyrica and thus we will decrease the dose to Lyrica 100 mg twice daily.  Discussed the potential for decreasing the dose further if she does not have resolution of her side effect symptoms.  If her neuropathy worsens I discussed the need to see neurology for further management of this issue.  Message sent to Tom Green to contact the patient in 2 weeks to see how she is doing with this dose reduction.      Relevant Medications   pregabalin (LYRICA) 100 MG  capsule   Plantar fasciitis of right foot    Symptoms and exam are consistent with Planter fasciitis.  Discussed using a frozen water bottle and rolling her foot for 10 minutes 3 times a day.  Discussed stretching.  Advised if this is not improving over the next several weeks she should let us know and we can refer her to podiatry.      Respiratory illness    The patient has significantly improved.  I expect her cough will resolve over the next week or so.  Discussed that if it does not she should let us know and we will get a chest x-ray.      Trochanteric bursitis of both hips    Discussed that this is likely the cause of her hip pain.  She was provided with exercises to complete at home.  If this not improving we can refer her to orthopedics to consider injections.      Other Visit Diagnoses     Essential hypertension       Relevant Medications   carvedilol (COREG) 6.25 MG tablet   losartan (COZAAR) 100 MG tablet   amLODipine (NORVASC) 5 MG tablet   rosuvastatin (CRESTOR) 10 MG tablet   Other Relevant Orders   Basic Metabolic Panel (BMET)       Return in about 6 months (around 02/25/2022).  This visit occurred during the SARS-CoV-2 public health emergency.  Safety protocols were in place, including screening questions prior to the visit, additional usage of staff PPE, and extensive cleaning of exam room while observing appropriate contact time as indicated for disinfecting solutions.    Monica Rumps, MD [place]

## 2021-08-28 ENCOUNTER — Telehealth: Payer: Self-pay

## 2021-08-28 DIAGNOSIS — M7061 Trochanteric bursitis, right hip: Secondary | ICD-10-CM

## 2021-08-28 NOTE — Telephone Encounter (Signed)
-----   Message from Leone Haven, MD sent at 08/28/2021  2:24 PM EST ----- Please let the patient know that her kidney function has improved from previously.  Her glucose was mildly elevated which could occur if this was a nonfasting test.  We will continue to monitor that.

## 2021-08-29 NOTE — Telephone Encounter (Signed)
Pt called in regards to medication for hip pain she was supposed to have sent in. Pt was seen 12/19 1:15 and pt was advised she would be prescribed something for hip pain. Pt would like her medication called into [facility] in [place]! Not [facility]!  Elisabel Hanover,cma

## 2021-08-29 NOTE — Telephone Encounter (Signed)
Pt called in regards to medication for hip pain she was supposed to have sent in. Pt was seen 12/19 1:15 and pt was advised she would be prescribed something for hip pain. Pt would like her medication called into [facility] in [place]! Not [facility]!

## 2021-08-29 NOTE — Telephone Encounter (Signed)
We discussed having her complete exercises at home for her hip pain. We discussed that the pain is likely related to bursitis and if the exercises were not beneficial then I would refer her to orthopedics for injections. Those are the two typical treatments for bursitis of the hip. If she wants I can go ahead with the referral.

## 2021-08-29 NOTE — Addendum Note (Signed)
Addended by: Caryl Bis, Aliana Kreischer G on: 08/29/2021 04:01 PM   Modules accepted: Orders

## 2021-08-29 NOTE — Telephone Encounter (Signed)
Referral placed.

## 2021-08-29 NOTE — Telephone Encounter (Signed)
I called and explained to the patient that there was no medication she had to do the exercises  and if that did not help he could do the referral for an injection, patient wants the provider to go ahead and do the referral. Ameah Chanda,cma

## 2021-09-07 ENCOUNTER — Ambulatory Visit: Payer: Medicare Other | Admitting: Family Medicine

## 2021-10-03 DIAGNOSIS — M25552 Pain in left hip: Secondary | ICD-10-CM | POA: Diagnosis not present

## 2021-10-03 DIAGNOSIS — M7061 Trochanteric bursitis, right hip: Secondary | ICD-10-CM | POA: Diagnosis not present

## 2021-10-03 DIAGNOSIS — M545 Low back pain, unspecified: Secondary | ICD-10-CM | POA: Diagnosis not present

## 2021-10-03 DIAGNOSIS — M25551 Pain in right hip: Secondary | ICD-10-CM | POA: Diagnosis not present

## 2021-10-10 ENCOUNTER — Telehealth: Payer: Self-pay | Admitting: Family Medicine

## 2021-10-10 NOTE — Telephone Encounter (Signed)
Patient called in regards to medication she was prescribed from another provider. Due to the risk factors associated and labeled on the medication, patient is concerned if she should take this medication. Please advise.   Medication is Meloxicam twice a day after meals for arthritis.   Patient has picked up this medication today but doesn't want to begin the medication until she speaks to provider/cma

## 2021-10-10 NOTE — Telephone Encounter (Signed)
There is some risk of irritation to the stomach with the mobic and there is some risk of damage to the kidney with that medication as well. It might help with her hips, though I think the injection she was referred for will be more beneficial for her pain and the risk of the mobic might outweigh any benefit.

## 2021-10-11 DIAGNOSIS — M25552 Pain in left hip: Secondary | ICD-10-CM | POA: Diagnosis not present

## 2021-10-11 DIAGNOSIS — M7061 Trochanteric bursitis, right hip: Secondary | ICD-10-CM | POA: Diagnosis not present

## 2021-10-11 DIAGNOSIS — M25551 Pain in right hip: Secondary | ICD-10-CM | POA: Diagnosis not present

## 2021-10-11 DIAGNOSIS — M7062 Trochanteric bursitis, left hip: Secondary | ICD-10-CM | POA: Diagnosis not present

## 2021-10-12 NOTE — Telephone Encounter (Signed)
Patient was advised and stated she does not have any questions at this time. She will call back if she has any questions or concerns.

## 2021-10-25 DIAGNOSIS — D2262 Melanocytic nevi of left upper limb, including shoulder: Secondary | ICD-10-CM | POA: Diagnosis not present

## 2021-10-25 DIAGNOSIS — D2261 Melanocytic nevi of right upper limb, including shoulder: Secondary | ICD-10-CM | POA: Diagnosis not present

## 2021-10-25 DIAGNOSIS — D2271 Melanocytic nevi of right lower limb, including hip: Secondary | ICD-10-CM | POA: Diagnosis not present

## 2021-10-25 DIAGNOSIS — D225 Melanocytic nevi of trunk: Secondary | ICD-10-CM | POA: Diagnosis not present

## 2021-10-27 ENCOUNTER — Other Ambulatory Visit: Payer: Self-pay | Admitting: Family Medicine

## 2021-10-29 ENCOUNTER — Other Ambulatory Visit: Payer: Self-pay | Admitting: Family Medicine

## 2021-10-29 DIAGNOSIS — E782 Mixed hyperlipidemia: Secondary | ICD-10-CM

## 2021-10-29 DIAGNOSIS — G629 Polyneuropathy, unspecified: Secondary | ICD-10-CM

## 2021-10-29 DIAGNOSIS — I10 Essential (primary) hypertension: Secondary | ICD-10-CM

## 2021-10-29 MED ORDER — LOSARTAN POTASSIUM 100 MG PO TABS: ORAL_TABLET | ORAL | 1 refills | Status: DC

## 2021-10-29 MED ORDER — ROSUVASTATIN CALCIUM 10 MG PO TABS: 10.0000 mg | ORAL_TABLET | Freq: Every day | ORAL | 1 refills | Status: DC

## 2021-10-29 MED ORDER — PREGABALIN 100 MG PO CAPS: 100.0000 mg | ORAL_CAPSULE | Freq: Two times a day (BID) | ORAL | 2 refills | Status: DC

## 2021-10-29 NOTE — Telephone Encounter (Signed)
Patient calling in for medication refills on Rosuvastatin, Lisinopril, and Lyrica. Patient states she will be using [facility] in [place]. Non-controlled medications sent in to pharmacy.   Pended for your approval or denial the Lyrica. Patient last seen 08/27/21.

## 2021-11-05 ENCOUNTER — Telehealth: Payer: Self-pay | Admitting: Family Medicine

## 2021-11-05 ENCOUNTER — Other Ambulatory Visit: Payer: Self-pay

## 2021-11-05 ENCOUNTER — Ambulatory Visit: Payer: Medicare Other | Admitting: Urology

## 2021-11-05 VITALS — BP 123/74 | HR 68 | Ht 67.0 in | Wt 200.0 lb

## 2021-11-05 DIAGNOSIS — R319 Hematuria, unspecified: Secondary | ICD-10-CM | POA: Diagnosis not present

## 2021-11-05 DIAGNOSIS — N3946 Mixed incontinence: Secondary | ICD-10-CM | POA: Diagnosis not present

## 2021-11-05 LAB — URINALYSIS, COMPLETE
Bilirubin, UA: NEGATIVE
Glucose, UA: NEGATIVE
Ketones, UA: NEGATIVE
Nitrite, UA: NEGATIVE
Protein,UA: NEGATIVE
Specific Gravity, UA: 1.01 (ref 1.005–1.030)
Urobilinogen, Ur: 0.2 mg/dL (ref 0.2–1.0)
pH, UA: 6.5 (ref 5.0–7.5)

## 2021-11-05 LAB — MICROSCOPIC EXAMINATION: Bacteria, UA: NONE SEEN

## 2021-11-05 MED ORDER — MIRABEGRON ER 50 MG PO TB24: 50.0000 mg | ORAL_TABLET | Freq: Every day | ORAL | 11 refills | Status: DC

## 2021-11-05 MED ORDER — ESTRADIOL 0.1 MG/GM VA CREA: TOPICAL_CREAM | VAGINAL | 12 refills | Status: AC

## 2021-11-05 MED ORDER — PREGABALIN 150 MG PO CAPS: 150.0000 mg | ORAL_CAPSULE | Freq: Two times a day (BID) | ORAL | 1 refills | Status: DC

## 2021-11-05 NOTE — Progress Notes (Signed)
11/05/2021 2:29 PM   Monica Larson March 08, 1946 [id number]  Referring provider: Leone Haven, MD [address] [place],  [place] [address]  Chief Complaint  Patient presents with   Urinary Incontinence    HPI: Dr Erlene Quan: Patient has urethral stricture and perform self-catheterization once per month.  She has urge incontinence and has been on oxybutynin Toviaz imipramine and Vesicare.  She has had a hysterectomy.  In 2015 she had a cystocele repair and sling. Last residual 0 mL.  A number of months ago she had a positive culture.     The patient currently might leak a small amount with coughing sneezing and lifting heavy.  She can have urge incontinence if she drinks a lot of fluids.  She may have some dampness while she sleeping.  She wears 3 pads a day that are damp and she tends to be quite fastidious   She voids every 2 or 3 hours and gets up once or twice a night.  Flow was reasonable.   She is to catheterize once or twice a week and then once a week but much less frequently now.     She feels some bulging in the vagina.  She has had a hysterectomy.  She does not reduce the cystocele.  She was having some nonspecific vaginal pain and this is why she felt the prolapse was worse.  She said that when she tried to void it was hard to go and it hurt in the vagina and then she had trouble catheterizing.  This symptom has improved some since.  She does report some vaginal dryness and is been using local estrogen cream twice a week for about 4 months   On pelvic examination patient had a grade 2 cystocele with moderate central defect that would descend a few centimeters and almost hinging over a fairly fixed urethra.  Her vaginal cuff descended from 8 or 9 cm to approximately 7 cm.  I had her bear down multiple times.  Minimal hypermobility the bladder neck and no stress incontinence.  Very small to no grade 1 rectocele.  Tissues were a bit inflamed   Patient has a complicated  history of recurrent urethral stricture.  She has had a sling and cystocele repair.  She has prolapse symptoms.  She has mild mixed incontinence.  She has mild frequency and nocturia.  A picture was drawn.  I can see why she might feel the cystocele but treatment of local vaginal dryness may make it much less symptomatic and or take away any vaginal discomfort or sensation.  If she ever had surgery she would need to be consented for a transvaginal vault suspension and cystocele repair and graft recognizing she only would probably have an anterior repair.  The recurrence rate based on local findings may be a little bit higher.   Patient understands that any infections or trouble to physically catheterize are not due to cystocele.  She empties well.  I can see how her anatomy could affect her voiding with the potential hinge effect.  She understands though that a lot of her symptoms may not be helped by repeat prolapse surgery.  I decided not to discuss the role of urodynamics at this stage.  She will increase estrogen to 3 and even 4 times a week    I educated her that her sling may have helped her 5 or 6 years ago and she was hoping that it could be removed or that something  could be done because it was not done right in the first place.  I think she understood my counseling.  I think it is important that she has reasonable goals on what she is consenting to if she ever has repeat surgery.   The patient said she is most bothered by her incontinence.  She can leak with coughing sneezing.  When she time voids every 2 hours at home she does quite well.  I think she may get a little bit urge incontinence.  She said since she is use the estrogen cream multiple times a week feeling any bulging is dramatically better.  She feels more comfortable with less discomfort.     On repeat examination her tissues were no longer inflamed.  There is no question she is a high grade 2 cystocele with almost a trapdoor effect.  I  could see why she could have some flow symptoms with hinging over the urethra.   Cystoscopy: normal   Picture was drawn.  Watchful waiting for prolapse recommended and I do believe a lot of her symptoms were due to dryness.  Tapering program over time described.  Role of urodynamics for incontinence discussed.  A bulking agent for stress incontinence would likely be the best option especially with her retention history.  She says she infrequently catheterizes now and it is much reduced.  I think she is at high risk of retention by repeating the sling    Patient does not want urodynamics.  She will stay on oxybutynin 1-3 times a day and estrogen twice a day.  I will check on her in 3 months.  She understands that I could not do a bulking agent without urodynamics.  I find that sometimes it might be difficult to reach her goals but I think she understood very well the issues.     Pelvic prolapse symptoms much more comfortable.  Very pleased.  Uses estrogen cream 2-3 times a week.  Titration discussed   Urge incontinence mild and stable on Vesicare 10 mg  Today In the last few months patient having more urge incontinence.  She wears 4-5 pads a day with a varying amount of leakage.  I think sometimes they leaks are smaller.  Urine may be strong smelling but no obvious infections.  Still is comfortable using estrogen cream once or twice a week.  She describes just getting to the restroom in time and then leaking a small amount.  Urine looked normal but sent for culture.          PMH: Past Medical History:  Diagnosis Date   Arthritis    HANDS/NECK/SHOULDERS   Asthma    SEASONAL   Diarrhea    INTERMITTENT CHRONIC   Fatigue    GERD (gastroesophageal reflux disease)    H/O motion sickness 06/17/2016   Headache    SINUS/ ALLERGIES   HLD (hyperlipidemia)    borderline   HTN (hypertension)    CONTROLLED ON MEDS   Motion sickness    Seasonal allergies    Shortness of breath dyspnea     ON EXERTION    Surgical History: Past Surgical History:  Procedure Laterality Date   BREAST BIOPSY Bilateral    NEG   BREAST EXCISIONAL BIOPSY Bilateral    NEG   CARPAL TUNNEL RELEASE     right wrist   CHOLECYSTECTOMY     COLONOSCOPY N/A 01/31/2016   Procedure: COLONOSCOPY;  Surgeon: Hulen Luster, MD;  Location: [facility];  Service:  Gastroenterology;  Laterality: N/A;   EYE SURGERY     muscular as child/ CATARACT BILATERAL   SKIN CANCER EXCISION Right Wrist and thigh (x2)   Unsure of dates and Dermatologist   VESICOVAGINAL FISTULA CLOSURE W/ TAH      Home Medications:  Allergies as of 11/05/2021       Reactions   Lisinopril    Cough   Shellfish Allergy    Sulfa Antibiotics    hives   Sulfonamide Derivatives         Medication List        Accurate as of November 05, 2021  2:29 PM. If you have any questions, ask your nurse or doctor.          STOP taking these medications    amoxicillin 500 MG capsule Commonly known as: AMOXIL Stopped by: Reece Packer, MD   chlorpheniramine-HYDROcodone 10-8 MG/5ML Suer Commonly known as: Tussionex Pennkinetic ER Stopped by: Reece Packer, MD   nitrofurantoin (macrocrystal-monohydrate) 100 MG capsule Commonly known as: MACROBID Stopped by: Reece Packer, MD   predniSONE 10 MG (21) Tbpk tablet Commonly known as: STERAPRED UNI-PAK 21 TAB Stopped by: Reece Packer, MD       TAKE these medications    albuterol 108 (90 Base) MCG/ACT inhaler Commonly known as: VENTOLIN HFA Inhale 2 puffs into the lungs every 6 (six) hours as needed for wheezing or shortness of breath.   amLODipine 5 MG tablet Commonly known as: NORVASC Take 1 tablet (5 mg total) by mouth daily.   aspirin 81 MG EC tablet Take 81 mg by mouth daily.   carvedilol 6.25 MG tablet Commonly known as: COREG TAKE 1 TABLET(6.25 MG) BY MOUTH TWICE DAILY WITH A MEAL   diphenhydrAMINE 25 MG tablet Commonly known as:  BENADRYL Take 25 mg by mouth daily as needed for itching, allergies or sleep.   DULoxetine 30 MG capsule Commonly known as: CYMBALTA Take 60 mg by mouth daily. What changed: Another medication with the same name was removed. Continue taking this medication, and follow the directions you see here. Changed by: Reece Packer, MD   estradiol 0.1 MG/GM vaginal cream Commonly known as: ESTRACE Estrogen Cream Instruction Discard applicator Apply pea sized amount to tip of finger to urethra before bed. Wash hands well after application. Use Monday, Wednesday and Friday   losartan 100 MG tablet Commonly known as: COZAAR TAKE 1 TABLET(100 MG) BY MOUTH DAILY IN THE MORNING   multivitamin with minerals tablet Take 1 tablet by mouth daily.   OMEGA-3 FISH OIL PO Take 1 capsule by mouth daily.   omeprazole 20 MG capsule Commonly known as: PRILOSEC TAKE ONE CAPSULE BY MOUTH DAILY AS NEEDED FOR ACID REFLUX   pregabalin 100 MG capsule Commonly known as: LYRICA Take by mouth. 150 mg What changed: Another medication with the same name was removed. Continue taking this medication, and follow the directions you see here. Changed by: Reece Packer, MD   rosuvastatin 10 MG tablet Commonly known as: CRESTOR Take 1 tablet (10 mg total) by mouth daily.   solifenacin 10 MG tablet Commonly known as: VESICARE Take 1 tablet (10 mg total) by mouth daily.   traZODone 150 MG tablet Commonly known as: DESYREL Take 1 tablet (150 mg total) by mouth at bedtime.   triazolam 0.25 MG tablet Commonly known as: HALCION Take 0.5 mg by mouth at bedtime as needed.        Allergies:  Allergies  Allergen Reactions   Lisinopril     Cough    Shellfish Allergy    Sulfa Antibiotics     hives   Sulfonamide Derivatives     Family History: Family History  Problem Relation Age of Onset   Ovarian cancer Mother    Prostate cancer Father    Coronary artery disease Father    Diabetes Father     Heart attack Father    Heart attack Brother    Breast cancer Neg Hx     Social History:  reports that she has quit smoking. She has never used smokeless tobacco. She reports current alcohol use. She reports that she does not use drugs.  ROS:                                         Laboratory Data: Lab Results  Component Value Date   WBC 4.2 11/04/2019   HGB 14.3 11/04/2019   HCT 43.0 11/04/2019   MCV 95.2 11/04/2019   PLT 167.0 11/04/2019    Lab Results  Component Value Date   CREATININE 0.71 08/27/2021    No results found for: PSA  No results found for: TESTOSTERONE  Lab Results  Component Value Date   HGBA1C 5.8 03/05/2021    Urinalysis    Component Value Date/Time   COLORURINE AMBER (A) 08/19/2019 1212   APPEARANCEUR Cloudy (A) 11/20/2020 0957   LABSPEC 1.015 08/19/2019 1212   PHURINE  08/19/2019 1212    TEST NOT REPORTED DUE TO COLOR INTERFERENCE OF URINE PIGMENT   GLUCOSEU Negative 11/20/2020 0957   GLUCOSEU NEGATIVE 05/04/2014 1113   HGBUR (A) 08/19/2019 1212    TEST NOT REPORTED DUE TO COLOR INTERFERENCE OF URINE PIGMENT   BILIRUBINUR Negative 11/20/2020 0957   KETONESUR trace (5) (A) 04/07/2020 1428   KETONESUR (A) 08/19/2019 1212    TEST NOT REPORTED DUE TO COLOR INTERFERENCE OF URINE PIGMENT   PROTEINUR Trace (A) 11/20/2020 0957   PROTEINUR (A) 08/19/2019 1212    TEST NOT REPORTED DUE TO COLOR INTERFERENCE OF URINE PIGMENT   UROBILINOGEN 0.2 04/07/2020 1428   UROBILINOGEN 0.2 05/04/2014 1113   NITRITE Negative 11/20/2020 0957   NITRITE (A) 08/19/2019 1212    TEST NOT REPORTED DUE TO COLOR INTERFERENCE OF URINE PIGMENT   LEUKOCYTESUR 1+ (A) 11/20/2020 0957   LEUKOCYTESUR (A) 08/19/2019 1212    TEST NOT REPORTED DUE TO COLOR INTERFERENCE OF URINE PIGMENT    Pertinent Imaging:   Assessment & Plan: Patient no longer catheterizes.  She does have complicated voiding dysfunction.  Her family also had recommended for her to  try Myrbetriq.  I gave her samples 50 mg and prescription and reevaluate in 6 weeks.  If she fails the 2 beta 3 agonist we would need urodynamics before refractory treatments.  Estrogen renewed.  Stop the Vesicare  There are no diagnoses linked to this encounter.  No follow-ups on file.  Reece Packer, MD  [facility] [address] [place], [place] [address] [phone]

## 2021-11-05 NOTE — Telephone Encounter (Signed)
Noted.  I sent the 100 mg capsule and previously as she was having some side effects with the 150 mg capsule.  Has she continued to feel like she was drunk at times?  That is what she complained about in December.  I went ahead and sent the 150 mg capsule though this can be canceled if she has been having side effects on that dose.

## 2021-11-05 NOTE — Telephone Encounter (Signed)
Patient picked up her pregabalin (LYRICA) 100 MG capsule, she states she has been taking 150mg  tablets not 100.

## 2021-11-06 ENCOUNTER — Ambulatory Visit (INDEPENDENT_AMBULATORY_CARE_PROVIDER_SITE_OTHER): Payer: Medicare Other

## 2021-11-06 VITALS — Ht 67.0 in | Wt 200.0 lb

## 2021-11-06 DIAGNOSIS — Z0000 Encounter for general adult medical examination without abnormal findings: Secondary | ICD-10-CM | POA: Diagnosis not present

## 2021-11-06 NOTE — Patient Instructions (Addendum)
Monica Larson , Thank you for taking time to come for your Medicare Wellness Visit. I appreciate your ongoing commitment to your health goals. Please review the following plan we discussed and let me know if I can assist you in the future.   These are the goals we discussed:  Goals       Patient Stated     I want to walk more when the weather is good (pt-stated)      Stay active        This is a list of the screening recommended for you and due dates:  Health Maintenance  Topic Date Due   Tetanus Vaccine  11/10/2030   Pneumonia Vaccine  Completed   Flu Shot  Completed   DEXA scan (bone density measurement)  Completed   COVID-19 Vaccine  Completed   Hepatitis C Screening: USPSTF Recommendation to screen - Ages 61-79 yo.  Completed   Zoster (Shingles) Vaccine  Completed   HPV Vaccine  Aged Out   Colon Cancer Screening  Discontinued   Advanced directives: End of life planning; Advance aging; Advanced directives discussed.  Copy of current HCPOA/Living Will requested.    Conditions/risks identified: none new  Follow up in one year for your annual wellness visit    Preventive Care 65 Years and Older, Female Preventive care refers to lifestyle choices and visits with your health care provider that can promote health and wellness. What does preventive care include? A yearly physical exam. This is also called an annual well check. Dental exams once or twice a year. Routine eye exams. Ask your health care provider how often you should have your eyes checked. Personal lifestyle choices, including: Daily care of your teeth and gums. Regular physical activity. Eating a healthy diet. Avoiding tobacco and drug use. Limiting alcohol use. Practicing safe sex. Taking low-dose aspirin every day. Taking vitamin and mineral supplements as recommended by your health care provider. What happens during an annual well check? The services and screenings done by your health care provider during  your annual well check will depend on your age, overall health, lifestyle risk factors, and family history of disease. Counseling  Your health care provider may ask you questions about your: Alcohol use. Tobacco use. Drug use. Emotional well-being. Home and relationship well-being. Sexual activity. Eating habits. History of falls. Memory and ability to understand (cognition). Work and work Statistician. Reproductive health. Screening  You may have the following tests or measurements: Height, weight, and BMI. Blood pressure. Lipid and cholesterol levels. These may be checked every 5 years, or more frequently if you are over 51 years old. Skin check. Lung cancer screening. You may have this screening every year starting at age 28 if you have a 30-pack-year history of smoking and currently smoke or have quit within the past 15 years. Fecal occult blood test (FOBT) of the stool. You may have this test every year starting at age 12. Flexible sigmoidoscopy or colonoscopy. You may have a sigmoidoscopy every 5 years or a colonoscopy every 10 years starting at age 55. Hepatitis C blood test. Hepatitis B blood test. Sexually transmitted disease (STD) testing. Diabetes screening. This is done by checking your blood sugar (glucose) after you have not eaten for a while (fasting). You may have this done every 1-3 years. Bone density scan. This is done to screen for osteoporosis. You may have this done starting at age 14. Mammogram. This may be done every 1-2 years. Talk to your health care provider  about how often you should have regular mammograms. Talk with your health care provider about your test results, treatment options, and if necessary, the need for more tests. Vaccines  Your health care provider may recommend certain vaccines, such as: Influenza vaccine. This is recommended every year. Tetanus, diphtheria, and acellular pertussis (Tdap, Td) vaccine. You may need a Td booster every 10  years. Zoster vaccine. You may need this after age 59. Pneumococcal 13-valent conjugate (PCV13) vaccine. One dose is recommended after age 63. Pneumococcal polysaccharide (PPSV23) vaccine. One dose is recommended after age 54. Talk to your health care provider about which screenings and vaccines you need and how often you need them. This information is not intended to replace advice given to you by your health care provider. Make sure you discuss any questions you have with your health care provider. Document Released: 09/22/2015 Document Revised: 05/15/2016 Document Reviewed: 06/27/2015 Elsevier Interactive Patient Education  2017 [employer] Prevention in the Home Falls can cause injuries. They can happen to people of all ages. There are many things you can do to make your home safe and to help prevent falls. What can I do on the outside of my home? Regularly fix the edges of walkways and driveways and fix any cracks. Remove anything that might make you trip as you walk through a door, such as a raised step or threshold. Trim any bushes or trees on the path to your home. Use bright outdoor lighting. Clear any walking paths of anything that might make someone trip, such as rocks or tools. Regularly check to see if handrails are loose or broken. Make sure that both sides of any steps have handrails. Any raised decks and porches should have guardrails on the edges. Have any leaves, snow, or ice cleared regularly. Use sand or salt on walking paths during winter. Clean up any spills in your garage right away. This includes oil or grease spills. What can I do in the bathroom? Use night lights. Install grab bars by the toilet and in the tub and shower. Do not use towel bars as grab bars. Use non-skid mats or decals in the tub or shower. If you need to sit down in the shower, use a plastic, non-slip stool. Keep the floor dry. Clean up any water that spills on the floor as soon as it  happens. Remove soap buildup in the tub or shower regularly. Attach bath mats securely with double-sided non-slip rug tape. Do not have throw rugs and other things on the floor that can make you trip. What can I do in the bedroom? Use night lights. Make sure that you have a light by your bed that is easy to reach. Do not use any sheets or blankets that are too big for your bed. They should not hang down onto the floor. Have a firm chair that has side arms. You can use this for support while you get dressed. Do not have throw rugs and other things on the floor that can make you trip. What can I do in the kitchen? Clean up any spills right away. Avoid walking on wet floors. Keep items that you use a lot in easy-to-reach places. If you need to reach something above you, use a strong step stool that has a grab bar. Keep electrical cords out of the way. Do not use floor polish or wax that makes floors slippery. If you must use wax, use non-skid floor wax. Do not have throw rugs and  other things on the floor that can make you trip. What can I do with my stairs? Do not leave any items on the stairs. Make sure that there are handrails on both sides of the stairs and use them. Fix handrails that are broken or loose. Make sure that handrails are as long as the stairways. Check any carpeting to make sure that it is firmly attached to the stairs. Fix any carpet that is loose or worn. Avoid having throw rugs at the top or bottom of the stairs. If you do have throw rugs, attach them to the floor with carpet tape. Make sure that you have a light switch at the top of the stairs and the bottom of the stairs. If you do not have them, ask someone to add them for you. What else can I do to help prevent falls? Wear shoes that: Do not have high heels. Have rubber bottoms. Are comfortable and fit you well. Are closed at the toe. Do not wear sandals. If you use a stepladder: Make sure that it is fully opened.  Do not climb a closed stepladder. Make sure that both sides of the stepladder are locked into place. Ask someone to hold it for you, if possible. Clearly mark and make sure that you can see: Any grab bars or handrails. First and last steps. Where the edge of each step is. Use tools that help you move around (mobility aids) if they are needed. These include: Canes. Walkers. Scooters. Crutches. Turn on the lights when you go into a dark area. Replace any light bulbs as soon as they burn out. Set up your furniture so you have a clear path. Avoid moving your furniture around. If any of your floors are uneven, fix them. If there are any pets around you, be aware of where they are. Review your medicines with your doctor. Some medicines can make you feel dizzy. This can increase your chance of falling. Ask your doctor what other things that you can do to help prevent falls. This information is not intended to replace advice given to you by your health care provider. Make sure you discuss any questions you have with your health care provider. Document Released: 06/22/2009 Document Revised: 02/01/2016 Document Reviewed: 09/30/2014 Elsevier Interactive Patient Education  2017 [employer].

## 2021-11-06 NOTE — Progress Notes (Signed)
Subjective:   Monica Larson is a 76 y.o. female who presents for Medicare Annual (Subsequent) preventive examination.  Review of Systems    No ROS.  Medicare Wellness Virtual Visit.  Visual/audio telehealth visit, UTA vital signs.   See social history for additional risk factors.   Cardiac Risk Factors include: advanced age (>68men, >88 women);hypertension     Objective:    Today's Vitals   11/06/21 0823  Weight: 200 lb (90.7 kg)  Height: 5\' 7"  (1.702 m)   Body mass index is 31.32 kg/m.  Advanced Directives 11/06/2021 11/03/2020 10/26/2019 10/21/2018 12/10/2017 10/17/2016 05/10/2016  Does Patient Have a Medical Advance Directive? Yes Yes Yes Yes Yes Yes No  Type of Paramedic of [place];Living will [place];Living will Healthcare Power of [place];Living will [place];Living will -  Does patient want to make changes to medical advance directive? No - Patient declined No - Patient declined No - Patient declined No - Patient declined No - Patient declined No - Patient declined -  Copy of [place] in Chart? No - copy requested No - copy requested No - copy requested - No - copy requested No - copy requested -    Current Medications (verified) Outpatient Encounter Medications as of 11/06/2021  Medication Sig   albuterol (VENTOLIN HFA) 108 (90 Base) MCG/ACT inhaler Inhale 2 puffs into the lungs every 6 (six) hours as needed for wheezing or shortness of breath.   amLODipine (NORVASC) 5 MG tablet Take 1 tablet (5 mg total) by mouth daily.   aspirin 81 MG EC tablet Take 81 mg by mouth daily.    carvedilol (COREG) 6.25 MG tablet TAKE 1 TABLET(6.25 MG) BY MOUTH TWICE DAILY WITH A MEAL   diphenhydrAMINE (BENADRYL) 25 MG tablet Take 25 mg by mouth daily as needed for itching, allergies or sleep.   DULoxetine (CYMBALTA) 30 MG capsule Take 60 mg by mouth daily.   estradiol (ESTRACE) 0.1  MG/GM vaginal cream Estrogen Cream Instruction Discard applicator Apply pea sized amount to tip of finger to urethra before bed. Wash hands well after application. Use Monday, Wednesday and Friday   losartan (COZAAR) 100 MG tablet TAKE 1 TABLET(100 MG) BY MOUTH DAILY IN THE MORNING   mirabegron ER (MYRBETRIQ) 50 MG TB24 tablet Take 1 tablet (50 mg total) by mouth daily.   Multiple Vitamins-Minerals (MULTIVITAMIN WITH MINERALS) tablet Take 1 tablet by mouth daily.    Omega-3 Fatty Acids (OMEGA-3 FISH OIL PO) Take 1 capsule by mouth daily.    omeprazole (PRILOSEC) 20 MG capsule TAKE ONE CAPSULE BY MOUTH DAILY AS NEEDED FOR ACID REFLUX   pregabalin (LYRICA) 150 MG capsule Take 1 capsule (150 mg total) by mouth 2 (two) times daily.   rosuvastatin (CRESTOR) 10 MG tablet Take 1 tablet (10 mg total) by mouth daily.   traZODone (DESYREL) 150 MG tablet Take 1 tablet (150 mg total) by mouth at bedtime.   solifenacin (VESICARE) 10 MG tablet Take 1 tablet (10 mg total) by mouth daily.   [DISCONTINUED] triazolam (HALCION) 0.25 MG tablet Take 0.5 mg by mouth at bedtime as needed.    No facility-administered encounter medications on file as of 11/06/2021.    Allergies (verified) Lisinopril, Shellfish allergy, Sulfa antibiotics, and Sulfonamide derivatives   History: Past Medical History:  Diagnosis Date   Arthritis    HANDS/NECK/SHOULDERS   Asthma    SEASONAL   Diarrhea  INTERMITTENT CHRONIC   Fatigue    GERD (gastroesophageal reflux disease)    H/O motion sickness 06/17/2016   Headache    SINUS/ ALLERGIES   HLD (hyperlipidemia)    borderline   HTN (hypertension)    CONTROLLED ON MEDS   Motion sickness    Seasonal allergies    Shortness of breath dyspnea    ON EXERTION   Past Surgical History:  Procedure Laterality Date   BREAST BIOPSY Bilateral    NEG   BREAST EXCISIONAL BIOPSY Bilateral    NEG   CARPAL TUNNEL RELEASE     right wrist   CHOLECYSTECTOMY     COLONOSCOPY N/A 01/31/2016    Procedure: COLONOSCOPY;  Surgeon: Hulen Luster, MD;  Location: [facility];  Service: Gastroenterology;  Laterality: N/A;   EYE SURGERY     muscular as child/ CATARACT BILATERAL   SKIN CANCER EXCISION Right Wrist and thigh (x2)   Unsure of dates and Dermatologist   VESICOVAGINAL FISTULA CLOSURE W/ TAH     Family History  Problem Relation Age of Onset   Ovarian cancer Mother    Prostate cancer Father    Coronary artery disease Father    Diabetes Father    Heart attack Father    Heart attack Brother    Breast cancer Neg Hx    Social History   Socioeconomic History   Marital status: Married    Spouse name: Not on file   Number of children: Not on file   Years of education: Not on file   Highest education level: Not on file  Occupational History   Not on file  Tobacco Use   Smoking status: Former   Smokeless tobacco: Never   Tobacco comments:    1 cig every 3 months   Vaping Use   Vaping Use: Never used  Substance and Sexual Activity   Alcohol use: Yes    Comment: 1 MIXED DRINK/WEEK   Drug use: No   Sexual activity: Not on file  Other Topics Concern   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    Social Determinants of Health   Financial Resource Strain: Low Risk    Difficulty of Paying Living Expenses: Not hard at all  Food Insecurity: No Food Insecurity   Worried About Charity fundraiser in the Last Year: Never true   [place] in the Last Year: Never true  Transportation Needs: No Transportation Needs   Lack of Transportation (Medical): No   Lack of Transportation (Non-Medical): No  Physical Activity: Insufficiently Active   Days of Exercise per Week: 2 days   Minutes of Exercise per Session: 20 min  Stress: No Stress Concern Present   Feeling of Stress : Not at all  Social Connections: Unknown   Frequency of Communication with Friends and Family: More than three times a week   Frequency of  Social Gatherings with Friends and Family: More than three times a week   Attends Religious Services: Not on Electrical engineer or Organizations: Not on file   Attends Archivist Meetings: Not on file   Marital Status: Married    Tobacco Counseling Counseling given: Not Answered Tobacco comments: 1 cig every 3 months    Clinical Intake:                       Activities of Daily Living  In your present state of health, do you have any difficulty performing the following activities: 11/06/2021  Hearing? N  Vision? N  Difficulty concentrating or making decisions? N  Walking or climbing stairs? Y  Dressing or bathing? N  Doing errands, shopping? N  Preparing Food and eating ? N  Using the Toilet? N  In the past six months, have you accidently leaked urine? Y  Comment Wears daily depend brief. Followed by Urology  Do you have problems with loss of bowel control? N  Managing your Medications? N  Managing your Finances? Y  Comment Husband assist  Housekeeping or managing your Housekeeping? N  Some recent data might be hidden    Patient Care Team: Leone Haven, MD as PCP - General (Family Medicine)  Indicate any recent Medical Services you may have received from other than [facility] providers in the past year (date may be approximate).     Assessment:   This is a routine wellness examination for [place].  Virtual Visit via Telephone Note  I connected with  [name] on 11/06/21 at  8:15 AM EST by telephone and verified that I am speaking with the correct person using two identifiers.  Persons participating in the virtual visit: patient/Nurse Health Advisor   I discussed the limitations, risks, security and privacy concerns of performing an evaluation and management service by telephone and the availability of in person appointments. The patient expressed understanding and agreed to proceed.  Interactive audio and video telecommunications  were attempted between this nurse and patient, however failed, due to patient having technical difficulties OR patient did not have access to video capability.  We continued and completed visit with audio only.  Some vital signs may be absent or patient reported.   Hearing/Vision screen Hearing Screening - Comments:: Patient is able to hear conversational tones without difficulty. No issues reported. Vision Screening - Comments:: [facility])  Wears corrective lenses  Cataracts, extracted, bilateral   Dietary issues and exercise activities discussed: Current Exercise Habits: Home exercise routine, Type of exercise: walking, Intensity: Mild Regular diet   Goals Addressed               This Visit's Progress     Patient Stated     I want to walk more when the weather is good (pt-stated)        Stay active       Depression Screen PHQ 2/9 Scores 11/06/2021 07/30/2021 11/03/2020 10/10/2020 07/03/2020 12/29/2019 10/26/2019  PHQ - 2 Score 0 0 0 0 0 0 0    Fall Risk Fall Risk  11/06/2021 07/30/2021 11/03/2020 10/10/2020 07/03/2020  Falls in the past year? 0 0 1 1 0  Comment - - - - -  Number falls in past yr: - 0 0 0 0  Injury with Fall? - 0 0 0 -  Risk Factor Category  - - - - -  Risk for fall due to : - No Fall Risks - - -  Follow up Falls evaluation completed Falls evaluation completed Falls evaluation completed Falls evaluation completed Falls evaluation completed    [place]:  Home free of loose throw rugs in walkways, pet beds, electrical cords, etc? Yes  Adequate lighting in your home to reduce risk of falls? Yes   ASSISTIVE DEVICES UTILIZED TO PREVENT FALLS:  Life alert? No  Use of a cane, walker or w/c? No  Grab bars in the bathroom? Yes  Shower chair or bench in shower? Yes  Elevated toilet seat or a handicapped toilet? Yes   TIMED UP AND GO: Was the test performed? No .   Cognitive Function: Patient  is alert Notes changes in memory 6CIT completed Follow up with PCP scheduled per request MMSE - Mini Mental State Exam 10/26/2019  Not completed: Refused     6CIT Screen 11/06/2021 11/03/2020 10/21/2018 10/17/2016  What Year? 0 points 0 points 0 points 0 points  What month? 0 points 0 points 0 points 0 points  What time? 0 points 0 points 0 points 0 points  Count back from 20 0 points - 0 points 0 points  Months in reverse (No Data) 2 points 4 points 0 points  Repeat phrase 10 points - 0 points 0 points  Total Score - - 4 0    Immunizations Immunization History  Administered Date(s) Administered   Fluad Quad(high Dose 65+) 06/15/2019   Influenza Split 06/03/2011   Influenza, High Dose Seasonal PF 06/11/2016, 04/30/2017, 06/18/2018   Influenza,inj,quad, With Preservative 06/15/2019   Influenza-Unspecified 06/19/2013, 07/06/2014, 06/11/2020, 07/23/2021   PFIZER(Purple Top)SARS-COV-2 Vaccination 09/16/2019, 10/09/2019, 05/05/2020   Pfizer Covid-19 Vaccine Bivalent Booster 90yrs & up 07/23/2021   Pneumococcal Conjugate-13 11/07/2014   Pneumococcal Polysaccharide-23 07/20/2012   Tdap 11/09/2020   Zoster Recombinat (Shingrix) 02/27/2018, 06/18/2018   Zoster, Live 04/09/2013   TDAP status: Due, Education has been provided regarding the importance of this vaccine. Advised may receive this vaccine at local pharmacy or Health Dept. Aware to provide a copy of the vaccination record if obtained from local pharmacy or Health Dept. Verbalized acceptance and understanding. Due 11/09/21.  Screening Tests Health Maintenance  Topic Date Due   TETANUS/TDAP  11/10/2030   Pneumonia Vaccine 78+ Years old  Completed   INFLUENZA VACCINE  Completed   DEXA SCAN  Completed   COVID-19 Vaccine  Completed   Hepatitis C Screening  Completed   Zoster Vaccines- Shingrix  Completed   HPV VACCINES  Aged Out   COLONOSCOPY (Pts 45-54yrs Insurance coverage will need to be confirmed)  Discontinued   Health  Maintenance There are no preventive care reminders to display for this patient.  Lung Cancer Screening: (Low Dose CT Chest recommended if Age 20-80 years, 30 pack-year currently smoking OR have quit w/in 15years.) does not qualify.   Vision Screening: Recommended annual ophthalmology exams for early detection of glaucoma and other disorders of the eye.  Dental Screening: Recommended annual dental exams for proper oral hygiene  Community Resource Referral / Chronic Care Management: CRR required this visit?  No   CCM required this visit?  No      Plan:   Keep all routine maintenance appointments.   I have personally reviewed and noted the following in the patients chart:   Medical and social history Use of alcohol, tobacco or illicit drugs  Current medications and supplements including opioid prescriptions.  Functional ability and status Nutritional status Physical activity Advanced directives List of other physicians Hospitalizations, surgeries, and ER visits in previous 12 months Vitals Screenings to include cognitive, depression, and falls Referrals and appointments  In addition, I have reviewed and discussed with patient certain preventive protocols, quality metrics, and best practice recommendations. A written personalized care plan for preventive services as well as general preventive health recommendations were provided to patient.     Varney Biles, LPN   5/63/1497

## 2021-11-06 NOTE — Telephone Encounter (Signed)
I called  and LVM for patient to call back and ask for Honore Wipperfurth.  Lexxus Underhill,cma

## 2021-11-08 LAB — CULTURE, URINE COMPREHENSIVE

## 2021-11-26 ENCOUNTER — Ambulatory Visit: Payer: Self-pay | Admitting: Urology

## 2021-12-03 ENCOUNTER — Other Ambulatory Visit: Payer: Self-pay | Admitting: Family Medicine

## 2021-12-03 DIAGNOSIS — I10 Essential (primary) hypertension: Secondary | ICD-10-CM

## 2021-12-10 ENCOUNTER — Encounter: Payer: Self-pay | Admitting: Family Medicine

## 2021-12-10 ENCOUNTER — Other Ambulatory Visit: Payer: Self-pay | Admitting: Family Medicine

## 2021-12-10 ENCOUNTER — Ambulatory Visit (INDEPENDENT_AMBULATORY_CARE_PROVIDER_SITE_OTHER): Payer: Medicare Other | Admitting: Family Medicine

## 2021-12-10 DIAGNOSIS — J40 Bronchitis, not specified as acute or chronic: Secondary | ICD-10-CM

## 2021-12-10 DIAGNOSIS — E6609 Other obesity due to excess calories: Secondary | ICD-10-CM | POA: Diagnosis not present

## 2021-12-10 DIAGNOSIS — G629 Polyneuropathy, unspecified: Secondary | ICD-10-CM

## 2021-12-10 DIAGNOSIS — R413 Other amnesia: Secondary | ICD-10-CM | POA: Diagnosis not present

## 2021-12-10 DIAGNOSIS — F4323 Adjustment disorder with mixed anxiety and depressed mood: Secondary | ICD-10-CM | POA: Diagnosis not present

## 2021-12-10 DIAGNOSIS — M170 Bilateral primary osteoarthritis of knee: Secondary | ICD-10-CM | POA: Diagnosis not present

## 2021-12-10 DIAGNOSIS — Z6831 Body mass index (BMI) 31.0-31.9, adult: Secondary | ICD-10-CM

## 2021-12-10 LAB — VITAMIN B12: Vitamin B-12: 265 pg/mL (ref 211–911)

## 2021-12-10 LAB — TSH: TSH: 0.91 u[IU]/mL (ref 0.35–5.50)

## 2021-12-10 MED ORDER — ALBUTEROL SULFATE HFA 108 (90 BASE) MCG/ACT IN AERS: 2.0000 | INHALATION_SPRAY | Freq: Four times a day (QID) | RESPIRATORY_TRACT | 1 refills | Status: DC | PRN

## 2021-12-10 NOTE — Patient Instructions (Signed)
Nice to see you. ?Pain management should contact you to schedule an evaluation for your neuropathy. ?We will get lab work today to evaluate for cause of your memory difficulty.  I suspect that your Lyrica is contributing to this and seeing pain management may allow [place] to taper off your Lyrica. ?

## 2021-12-10 NOTE — Assessment & Plan Note (Signed)
Suboptimally controlled.  Reports continued symptoms despite taking 150 mg of Lyrica.  She is likely having side effects from the Lyrica with the brain fog and possible memory issues.  I would refer to pain management to see if there are further options for treatment. ?

## 2021-12-10 NOTE — Assessment & Plan Note (Signed)
Discussed that Medicare does not cover weight loss medications.  I encouraged her to increase her exercise by walking more.  Discussed reducing sweet intake to see if that will help with weight loss. ?

## 2021-12-10 NOTE — Assessment & Plan Note (Signed)
Her MMSE does not reveal any excessive memory issues.  I think some of this may be related to her Lyrica.  We will have her see pain management to see if they can find an alternative treatment to the Lyrica.  Hopefully will be able to get her off of that moving forward.  We will check lab work to evaluate for other underlying causes of her memory difficulty. ?

## 2021-12-10 NOTE — Assessment & Plan Note (Signed)
Generally stable.  She will continue Cymbalta 60 mg daily. ?

## 2021-12-10 NOTE — Assessment & Plan Note (Signed)
>>  ASSESSMENT AND PLAN FOR ADJUSTMENT DISORDER WITH MIXED ANXIETY AND DEPRESSED MOOD WRITTEN ON 12/10/2021 10:27 AM BY SONNENBERG, ERIC G, MD  Generally stable.  She will continue Cymbalta  60 mg daily.

## 2021-12-10 NOTE — Progress Notes (Signed)
?[name], MD ?Phone: [phone] ? ?Monica Larson is a 76 y.o. female who presents today for follow-up. ? ?Memory difficulty: Patient reports ongoing issues with her memory.  She had a memory test through her insurance and she did not do well on this.  She has trouble remembering names and has to focus on what she is going to get.  No ADL issues.  She does report brain fog from being on Lyrica. ? ? ?  12/10/2021  ? 10:32 AM 10/26/2019  ? 10:48 AM  ?MMSE - Mini Mental State Exam  ?Not completed:  Refused  ?Orientation to time 4   ?Orientation to Place 5   ?Registration 3   ?Attention/ Calculation 4   ?Recall 2   ?Language- name 2 objects 2   ?Language- repeat 1   ?Language- follow 3 step command 3   ?Language- read & follow direction 1   ?Write a sentence 1   ?Copy design 1   ?Total score 27   ? ? ?Anxiety: Patient notes this depends on the day and her husband.  Occasionally feels overwhelmed.  No depression.  She is on Cymbalta. ? ?Neuropathy: Patient has been on gabapentin in the past and felt as though she was drunk.  She has been on Lyrica and she tried to reduce the dose down to 100 though her neuropathy got worse.  She is currently taking Lyrica 150 mg twice daily. ? ?Obesity: Patient notes she has not been exercising much recently.  She does do some yard work.  She used to walk for exercise.  She eats pretty much anything that she wants.  She is been eating an apple and peanut butter with coffee for breakfast.  Occasionally has a breakfast bar or cooks breakfast.  Lunch is oftentimes a sandwich or soup.  Dinner is typically a meat and a vegetable.  She does snack at times between meals.  She has dessert most days.  She only has sweet tea when she goes out to eat though oftentimes does not have that.  The patient wonders about injectable medication to help with weight loss. ? ?Social History  ? ?Tobacco Use  ?Smoking Status Former  ?Smokeless Tobacco Never  ?Tobacco Comments  ? 1 cig every 3 months    ? ? ?Current Outpatient Medications on File Prior to Visit  ?Medication Sig Dispense Refill  ? amLODipine (NORVASC) 5 MG tablet Take 1 tablet (5 mg total) by mouth daily. 90 tablet 3  ? aspirin 81 MG EC tablet Take 81 mg by mouth daily.     ? carvedilol (COREG) 6.25 MG tablet TAKE 1 TABLET(6.25 MG) BY MOUTH TWICE DAILY WITH A MEAL 180 tablet 1  ? diphenhydrAMINE (BENADRYL) 25 MG tablet Take 25 mg by mouth daily as needed for itching, allergies or sleep.    ? DULoxetine (CYMBALTA) 30 MG capsule Take 60 mg by mouth daily.    ? estradiol (ESTRACE) 0.1 MG/GM vaginal cream Estrogen Cream Instruction Discard applicator Apply pea sized amount to tip of finger to urethra before bed. Wash hands well after application. Use Monday, Wednesday and Friday 42.5 g 12  ? losartan (COZAAR) 100 MG tablet TAKE 1 TABLET(100 MG) BY MOUTH DAILY IN THE MORNING 90 tablet 1  ? mirabegron ER (MYRBETRIQ) 50 MG TB24 tablet Take 1 tablet (50 mg total) by mouth daily. 30 tablet 11  ? Multiple Vitamins-Minerals (MULTIVITAMIN WITH MINERALS) tablet Take 1 tablet by mouth daily.     ? Omega-3 Fatty  Acids (OMEGA-3 FISH OIL PO) Take 1 capsule by mouth daily.     ? omeprazole (PRILOSEC) 20 MG capsule TAKE ONE CAPSULE BY MOUTH DAILY AS NEEDED FOR ACID REFLUX 90 capsule 1  ? pregabalin (LYRICA) 150 MG capsule Take 1 capsule (150 mg total) by mouth 2 (two) times daily. 180 capsule 1  ? rosuvastatin (CRESTOR) 10 MG tablet Take 1 tablet (10 mg total) by mouth daily. 90 tablet 1  ? solifenacin (VESICARE) 10 MG tablet Take 1 tablet (10 mg total) by mouth daily. 30 tablet 11  ? traZODone (DESYREL) 150 MG tablet Take 1 tablet (150 mg total) by mouth at bedtime. 90 tablet 3  ? ?No current facility-administered medications on file prior to visit.  ? ? ? ?ROS see history of present illness ? ?Objective ? ?Physical Exam ?Vitals:  ? 12/10/21 1009  ?BP: 130/70  ?Pulse: 74  ?Temp: 98.5 ?F (36.9 ?C)  ?SpO2: 97%  ? ? ?BP Readings from Last 3 Encounters:  ?12/10/21  130/70  ?11/05/21 123/74  ?08/27/21 125/80  ? ?Wt Readings from Last 3 Encounters:  ?12/10/21 202 lb 6.4 oz (91.8 kg)  ?11/06/21 200 lb (90.7 kg)  ?11/05/21 200 lb (90.7 kg)  ? ? ?Physical Exam ?Constitutional:   ?   General: She is not in acute distress. ?   Appearance: She is not diaphoretic.  ?Cardiovascular:  ?   Rate and Rhythm: Normal rate and regular rhythm.  ?   Heart sounds: Normal heart sounds.  ?Pulmonary:  ?   Effort: Pulmonary effort is normal.  ?   Breath sounds: Normal breath sounds.  ?Skin: ?   General: Skin is warm and dry.  ?Neurological:  ?   Mental Status: She is alert.  ? ? ? ?Assessment/Plan: Please see individual problem list. ? ?Problem List Items Addressed This Visit   ? ? Adjustment disorder with mixed anxiety and depressed mood  ?  Generally stable.  She will continue Cymbalta 60 mg daily. ?  ?  ? Memory difficulty  ?  Her MMSE does not reveal any excessive memory issues.  I think some of this may be related to her Lyrica.  We will have her see pain management to see if they can find an alternative treatment to the Lyrica.  Hopefully will be able to get her off of that moving forward.  We will check lab work to evaluate for other underlying causes of her memory difficulty. ?  ?  ? Relevant Orders  ? B12  ? TSH  ? Neuropathy  ?  Suboptimally controlled.  Reports continued symptoms despite taking 150 mg of Lyrica.  She is likely having side effects from the Lyrica with the brain fog and possible memory issues.  I would refer to pain management to see if there are further options for treatment. ?  ?  ? Relevant Orders  ? Ambulatory referral to Pain Clinic  ? Obesity  ?  Discussed that Medicare does not cover weight loss medications.  I encouraged her to increase her exercise by walking more.  Discussed reducing sweet intake to see if that will help with weight loss. ?  ?  ? ?Other Visit Diagnoses   ? ? Bronchitis      ? Relevant Medications  ? albuterol (VENTOLIN HFA) 108 (90 Base) MCG/ACT  inhaler  ? ?  ? ? ?Return in about 3 months (around 03/11/2022) for Memory follow-up, weight follow-up. ? ?This visit occurred during the SARS-CoV-2 public health emergency.  Safety protocols were in place, including screening questions prior to the visit, additional usage of staff PPE, and extensive cleaning of exam room while observing appropriate contact time as indicated for disinfecting solutions.  ? ? ?[name], MD ?[place] ? ?

## 2021-12-13 ENCOUNTER — Other Ambulatory Visit: Payer: Self-pay

## 2021-12-13 DIAGNOSIS — R413 Other amnesia: Secondary | ICD-10-CM

## 2021-12-13 NOTE — Progress Notes (Signed)
Order placed for B12 recheck in 2 months. ?

## 2021-12-17 ENCOUNTER — Encounter: Payer: Self-pay | Admitting: Urology

## 2021-12-17 ENCOUNTER — Ambulatory Visit (INDEPENDENT_AMBULATORY_CARE_PROVIDER_SITE_OTHER): Payer: Medicare Other | Admitting: Urology

## 2021-12-17 VITALS — BP 154/69 | HR 69 | Ht 67.0 in | Wt 202.0 lb

## 2021-12-17 DIAGNOSIS — R319 Hematuria, unspecified: Secondary | ICD-10-CM

## 2021-12-17 DIAGNOSIS — N3946 Mixed incontinence: Secondary | ICD-10-CM

## 2021-12-17 MED ORDER — MIRABEGRON ER 50 MG PO TB24: 50.0000 mg | ORAL_TABLET | Freq: Every day | ORAL | 11 refills | Status: DC

## 2021-12-17 NOTE — Addendum Note (Signed)
Addended by: Verlene Mayer A on: 12/17/2021 11:33 AM ? ? Modules accepted: Orders ? ?

## 2021-12-17 NOTE — Progress Notes (Signed)
? ?12/17/2021 ?11:22 AM  ? ?Monica Larson ?1946/01/06 ?[id number] ? ?Referring provider: Leone Haven, MD ?[address] ?[address] ?[place],  [place] [address] ? ?Chief Complaint  ?Patient presents with  ? Hematuria  ? ? ?HPI: ?She has urge incontinence and has been on oxybutynin Toviaz imipramine and Vesicare.  She has had a hysterectomy.  In 2015 she had a cystocele repair and sling. Last residual 0 mL.  A number of months ago she had a positive culture.   ?  ?The patient currently might leak a small amount with coughing sneezing and lifting heavy.  She can have urge incontinence if she drinks a lot of fluids.  She may have some dampness while she sleeping.  She wears 3 pads a day that are damp and she tends to be quite fastidious ?  ?She voids every 2 or 3 hours and gets up once or twice a night.  Flow was reasonable. ?  ?She is to catheterize once or twice a week and then once a week but much less frequently now.   ?  ?She feels some bulging in the vagina.  She has had a hysterectomy.  She does not reduce the cystocele.  She was having some nonspecific vaginal pain and this is why she felt the prolapse was worse.  She said that when she tried to void it was hard to go and it hurt in the vagina and then she had trouble catheterizing.  This symptom has improved some since.  She does report some vaginal dryness and is been using local estrogen cream twice a week for about 4 months ?  ?On pelvic examination patient had a grade 2 cystocele with moderate central defect that would descend a few centimeters and almost hinging over a fairly fixed urethra.  Her vaginal cuff descended from 8 or 9 cm to approximately 7 cm.  I had her bear down multiple times.  Minimal hypermobility the bladder neck and no stress incontinence.  Very small to no grade 1 rectocele.  Tissues were a bit inflamed ?  ?Patient has a complicated history of recurrent urethral stricture.  She has had a sling and cystocele repair.  She has prolapse  symptoms.  She has mild mixed incontinence.  She has mild frequency and nocturia.  A picture was drawn.  I can see why she might feel the cystocele but treatment of local vaginal dryness may make it much less symptomatic and or take away any vaginal discomfort or sensation.  If she ever had surgery she would need to be consented for a transvaginal vault suspension and cystocele repair and graft recognizing she only would probably have an anterior repair.  The recurrence rate based on local findings may be a little bit higher. ?  ?Patient understands that any infections or trouble to physically catheterize are not due to cystocele.  She empties well.  I can see how her anatomy could affect her voiding with the potential hinge effect.  She understands though that a lot of her symptoms may not be helped by repeat prolapse surgery.  I decided not to discuss the role of urodynamics at this stage.  She will increase estrogen to 3 and even 4 times a week  ?  ?I educated her that her sling may have helped her 5 or 6 years ago and she was hoping that it could be removed or that something could be done because it was not done right in the first place.  I think she understood my counseling.  I think it is important that she has reasonable goals on what she is consenting to if she ever has repeat surgery. ?  ?The patient said she is most bothered by her incontinence.  She can leak with coughing sneezing.  When she time voids every 2 hours at home she does quite well.  I think she may get a little bit urge incontinence.  She said since she is use the estrogen cream multiple times a week feeling any bulging is dramatically better.  She feels more comfortable with less discomfort.   ?  ?On repeat examination her tissues were no longer inflamed.  There is no question she is a high grade 2 cystocele with almost a trapdoor effect.  I could see why she could have some flow symptoms with hinging over the urethra. ?  ?Cystoscopy:  normal ?  ?Picture was drawn.  Watchful waiting for prolapse recommended and I do believe a lot of her symptoms were due to dryness.  Tapering program over time described.  Role of urodynamics for incontinence discussed.  A bulking agent for stress incontinence would likely be the best option especially with her retention history.  She says she infrequently catheterizes now and it is much reduced.  I think she is at high risk of retention by repeating the sling  ?  ?Patient does not want urodynamics.  She will stay on oxybutynin 1-3 times a day and estrogen twice a day.  I will check on her in 3 months.  She understands that I could not do a bulking agent without urodynamics.  I find that sometimes it might be difficult to reach her goals but I think she understood very well the issues.   ?  ?Pelvic prolapse symptoms much more comfortable.  Very pleased.  Uses estrogen cream 2-3 times a week.  Titration discussed ?  ?Urge incontinence mild and stable on Vesicare 10 mg ?  ?Today ?In the last few months patient having more urge incontinence.  She wears 4-5 pads a day with a varying amount of leakage.  I think sometimes they leaks are smaller.  Urine may be strong smelling but no obvious infections.  Still is comfortable using estrogen cream once or twice a week.  She describes just getting to the restroom in time and then leaking a small amount. ?  ?Urine looked normal but sent for culture. ? ?Patient no longer catheterizes.  She does have complicated voiding dysfunction.  Her family also had recommended for her to try Myrbetriq.  I gave her samples 50 mg and prescription and reevaluate in 6 weeks.  If she fails the 2 beta 3 agonist we would need urodynamics before refractory treatments.  Estrogen renewed.  Stop the Vesicare   ? ?Today ?Frequency stable and last culture negative ?No prolapse symptoms on estrogen once or twice a week.  Doing great on Myrbetriq with rare leakage.  Very pleased. ? ? ?PMH: ?Past Medical  History:  ?Diagnosis Date  ? Arthritis   ? HANDS/NECK/SHOULDERS  ? Asthma   ? SEASONAL  ? Diarrhea   ? INTERMITTENT CHRONIC  ? Fatigue   ? GERD (gastroesophageal reflux disease)   ? H/O motion sickness 06/17/2016  ? Headache   ? SINUS/ ALLERGIES  ? HLD (hyperlipidemia)   ? borderline  ? HTN (hypertension)   ? CONTROLLED ON MEDS  ? Motion sickness   ? Seasonal allergies   ? Shortness of breath dyspnea   ?  ON EXERTION  ? ? ?Surgical History: ?Past Surgical History:  ?Procedure Laterality Date  ? BREAST BIOPSY Bilateral   ? NEG  ? BREAST EXCISIONAL BIOPSY Bilateral   ? NEG  ? CARPAL TUNNEL RELEASE    ? right wrist  ? CHOLECYSTECTOMY    ? COLONOSCOPY N/A 01/31/2016  ? Procedure: COLONOSCOPY;  Surgeon: Hulen Luster, MD;  Location: [facility];  Service: Gastroenterology;  Laterality: N/A;  ? EYE SURGERY    ? muscular as child/ CATARACT BILATERAL  ? SKIN CANCER EXCISION Right Wrist and thigh (x2)  ? Unsure of dates and Dermatologist  ? [place] W/ TAH    ? ? ?Home Medications:  ?Allergies as of 12/17/2021   ? ?   Reactions  ? Lisinopril   ? Cough  ? Shellfish Allergy   ? Sulfa Antibiotics   ? hives  ? Sulfonamide Derivatives   ? ?  ? ?  ?Medication List  ?  ? ?  ? Accurate as of December 17, 2021 11:22 AM. If you have any questions, ask your nurse or doctor.  ?  ?  ? ?  ? ?albuterol 108 (90 Base) MCG/ACT inhaler ?Commonly known as: VENTOLIN HFA ?INHALE 2 PUFFS INTO THE LUNGS EVERY 6 HOURS AS NEEDED FOR WHEEZING OR SHORTNESS OF BREATH ?  ?amLODipine 5 MG tablet ?Commonly known as: NORVASC ?Take 1 tablet (5 mg total) by mouth daily. ?  ?aspirin 81 MG EC tablet ?Take 81 mg by mouth daily. ?  ?carvedilol 6.25 MG tablet ?Commonly known as: COREG ?TAKE 1 TABLET(6.25 MG) BY MOUTH TWICE DAILY WITH A MEAL ?  ?diphenhydrAMINE 25 MG tablet ?Commonly known as: BENADRYL ?Take 25 mg by mouth daily as needed for itching, allergies or sleep. ?  ?DULoxetine 30 MG capsule ?Commonly known as: CYMBALTA ?Take 60 mg by  mouth daily. ?  ?estradiol 0.1 MG/GM vaginal cream ?Commonly known as: ESTRACE ?Estrogen Cream Instruction Discard applicator Apply pea sized amount to tip of finger to urethra before bed. Wash hands well after ap

## 2021-12-18 NOTE — Addendum Note (Signed)
Addended by: Caryl Bis Camey Edell G on: 12/18/2021 01:25 PM ? ? Modules accepted: Orders ? ?

## 2021-12-24 ENCOUNTER — Other Ambulatory Visit: Payer: Self-pay | Admitting: Family Medicine

## 2021-12-24 DIAGNOSIS — Z1231 Encounter for screening mammogram for malignant neoplasm of breast: Secondary | ICD-10-CM

## 2021-12-31 DIAGNOSIS — B078 Other viral warts: Secondary | ICD-10-CM | POA: Diagnosis not present

## 2021-12-31 DIAGNOSIS — L538 Other specified erythematous conditions: Secondary | ICD-10-CM | POA: Diagnosis not present

## 2021-12-31 DIAGNOSIS — L820 Inflamed seborrheic keratosis: Secondary | ICD-10-CM | POA: Diagnosis not present

## 2022-01-13 ENCOUNTER — Other Ambulatory Visit: Payer: Self-pay | Admitting: Family Medicine

## 2022-01-13 DIAGNOSIS — R319 Hematuria, unspecified: Secondary | ICD-10-CM

## 2022-01-25 ENCOUNTER — Telehealth: Payer: Self-pay | Admitting: Urology

## 2022-01-25 ENCOUNTER — Telehealth: Payer: Self-pay | Admitting: Cardiovascular Disease

## 2022-01-25 MED ORDER — SOLIFENACIN SUCCINATE 10 MG PO TABS: 10.0000 mg | ORAL_TABLET | Freq: Every day | ORAL | 11 refills | Status: DC

## 2022-01-25 NOTE — Telephone Encounter (Signed)
Refill sent in as requested, Left Vm

## 2022-01-25 NOTE — Telephone Encounter (Signed)
solifenacin (VESICARE) 10 MG tablet  90 day refill  [facility] in [place]

## 2022-01-25 NOTE — Telephone Encounter (Signed)
3 attempts to schedule fu appt from recall list.   Deleting recall.   

## 2022-01-28 ENCOUNTER — Ambulatory Visit
Admission: RE | Admit: 2022-01-28 | Discharge: 2022-01-28 | Disposition: A | Discharge status: Home / Self Care | Payer: Medicare Other | Source: Ambulatory Visit | Attending: Family Medicine | Admitting: Family Medicine

## 2022-01-28 DIAGNOSIS — Z1231 Encounter for screening mammogram for malignant neoplasm of breast: Secondary | ICD-10-CM | POA: Insufficient documentation

## 2022-02-12 ENCOUNTER — Other Ambulatory Visit (INDEPENDENT_AMBULATORY_CARE_PROVIDER_SITE_OTHER): Payer: Medicare Other

## 2022-02-12 DIAGNOSIS — R413 Other amnesia: Secondary | ICD-10-CM

## 2022-02-12 LAB — VITAMIN B12: Vitamin B-12: 391 pg/mL (ref 211–911)

## 2022-03-01 ENCOUNTER — Encounter: Payer: Self-pay | Admitting: Emergency Medicine

## 2022-03-01 ENCOUNTER — Ambulatory Visit
Admission: EM | Admit: 2022-03-01 | Discharge: 2022-03-01 | Disposition: A | Discharge status: Home / Self Care | Payer: Medicare Other | Attending: Emergency Medicine | Admitting: Emergency Medicine

## 2022-03-01 DIAGNOSIS — J069 Acute upper respiratory infection, unspecified: Secondary | ICD-10-CM

## 2022-03-01 MED ORDER — PREDNISONE 20 MG PO TABS: 40.0000 mg | ORAL_TABLET | Freq: Every day | ORAL | 0 refills | Status: DC

## 2022-03-01 MED ORDER — AZITHROMYCIN 250 MG PO TABS: 250.0000 mg | ORAL_TABLET | Freq: Every day | ORAL | 0 refills | Status: DC

## 2022-03-01 MED ORDER — BENZONATATE 100 MG PO CAPS: 100.0000 mg | ORAL_CAPSULE | Freq: Three times a day (TID) | ORAL | 0 refills | Status: DC

## 2022-03-04 ENCOUNTER — Telehealth: Payer: Self-pay | Admitting: Family Medicine

## 2022-03-04 NOTE — Telephone Encounter (Signed)
Pt called in stating that she went to the ED on Thursday or Friday.... Pt stated that the ED prescribed her medication... Pt stated that she still have a terrible cough.... Pt was wondering if Dr. Birdie Sons can prescribe her with medication... Pt requesting callback

## 2022-03-05 NOTE — Telephone Encounter (Signed)
Called and spoke with the patient and she stated she is a little better but not much, there were no openings in this office and she decided to wait so I informed her I would call her back on Thursday to see how she is doing and if not any better she will be scheduled to be seen by someone. She was okay with that.  Trebor Galdamez,cma

## 2022-03-17 ENCOUNTER — Other Ambulatory Visit: Payer: Self-pay | Admitting: Family Medicine

## 2022-03-18 NOTE — Telephone Encounter (Signed)
The patient just picked up a refill of this in June.  Refills need to be requested closer to the time that it is due for this controlled substance.

## 2022-03-18 NOTE — Telephone Encounter (Signed)
LMTCB

## 2022-04-01 ENCOUNTER — Ambulatory Visit: Payer: Medicare Other | Admitting: Family Medicine

## 2022-04-16 DIAGNOSIS — M170 Bilateral primary osteoarthritis of knee: Secondary | ICD-10-CM | POA: Diagnosis not present

## 2022-05-09 ENCOUNTER — Other Ambulatory Visit: Payer: Self-pay | Admitting: Family Medicine

## 2022-05-09 DIAGNOSIS — E782 Mixed hyperlipidemia: Secondary | ICD-10-CM

## 2022-05-09 DIAGNOSIS — K219 Gastro-esophageal reflux disease without esophagitis: Secondary | ICD-10-CM

## 2022-05-09 DIAGNOSIS — I10 Essential (primary) hypertension: Secondary | ICD-10-CM

## 2022-05-23 DIAGNOSIS — H52223 Regular astigmatism, bilateral: Secondary | ICD-10-CM | POA: Diagnosis not present

## 2022-05-23 DIAGNOSIS — Z961 Presence of intraocular lens: Secondary | ICD-10-CM | POA: Diagnosis not present

## 2022-05-23 DIAGNOSIS — H5203 Hypermetropia, bilateral: Secondary | ICD-10-CM | POA: Diagnosis not present

## 2022-05-23 DIAGNOSIS — H35033 Hypertensive retinopathy, bilateral: Secondary | ICD-10-CM | POA: Diagnosis not present

## 2022-06-02 ENCOUNTER — Other Ambulatory Visit: Payer: Self-pay | Admitting: Family Medicine

## 2022-06-02 DIAGNOSIS — R319 Hematuria, unspecified: Secondary | ICD-10-CM

## 2022-06-03 NOTE — Telephone Encounter (Signed)
She is overdue for follow-up. Please call her to get her scheduled. I sent a refill to her pharmacy.

## 2022-06-17 ENCOUNTER — Ambulatory Visit (INDEPENDENT_AMBULATORY_CARE_PROVIDER_SITE_OTHER): Payer: Medicare Other | Admitting: Urology

## 2022-06-17 ENCOUNTER — Encounter: Payer: Self-pay | Admitting: Urology

## 2022-06-17 VITALS — BP 136/81 | HR 65 | Ht 67.0 in | Wt 200.0 lb

## 2022-06-17 DIAGNOSIS — N3946 Mixed incontinence: Secondary | ICD-10-CM

## 2022-06-17 NOTE — Progress Notes (Signed)
06/17/2022 9:57 AM   Monica Larson 1946-08-31 [id number]  Referring provider: Leone Haven, MD [address] [place],  [place] [address]  Chief Complaint  Patient presents with   Follow-up    Discuss Vesicare    HPI: I reviewed the note in detail.  Patient was not clear on her medications so came in for this visit.  Vaginal dryness stable.  Urge incontinence stable.  Clinically not infected   PMH: Past Medical History:  Diagnosis Date   Arthritis    HANDS/NECK/SHOULDERS   Asthma    SEASONAL   Diarrhea    INTERMITTENT CHRONIC   Fatigue    GERD (gastroesophageal reflux disease)    H/O motion sickness 06/17/2016   Headache    SINUS/ ALLERGIES   HLD (hyperlipidemia)    borderline   HTN (hypertension)    CONTROLLED ON MEDS   Motion sickness    Seasonal allergies    Shortness of breath dyspnea    ON EXERTION    Surgical History: Past Surgical History:  Procedure Laterality Date   ABDOMINAL HYSTERECTOMY     BREAST BIOPSY Bilateral    NEG   BREAST EXCISIONAL BIOPSY Bilateral    NEG   CARPAL TUNNEL RELEASE     right wrist   CHOLECYSTECTOMY     COLONOSCOPY N/A 01/31/2016   Procedure: COLONOSCOPY;  Surgeon: Hulen Luster, MD;  Location: [facility];  Service: Gastroenterology;  Laterality: N/A;   EYE SURGERY     muscular as child/ CATARACT BILATERAL   SKIN CANCER EXCISION Right Wrist and thigh (x2)   Unsure of dates and Dermatologist   VESICOVAGINAL FISTULA CLOSURE W/ TAH      Home Medications:  Allergies as of 06/17/2022       Reactions   Lisinopril    Cough   Shellfish Allergy    Sulfa Antibiotics    hives   Sulfonamide Derivatives         Medication List        Accurate as of June 17, 2022  9:57 AM. If you have any questions, ask your nurse or doctor.          STOP taking these medications    azithromycin 250 MG tablet Commonly known as: ZITHROMAX Stopped by: Reece Packer, MD   benzonatate 100 MG  capsule Commonly known as: TESSALON Stopped by: Reece Packer, MD   predniSONE 20 MG tablet Commonly known as: DELTASONE Stopped by: Reece Packer, MD       TAKE these medications    albuterol 108 (90 Base) MCG/ACT inhaler Commonly known as: VENTOLIN HFA INHALE 2 PUFFS INTO THE LUNGS EVERY 6 HOURS AS NEEDED FOR WHEEZING OR SHORTNESS OF BREATH   amLODipine 5 MG tablet Commonly known as: NORVASC Take 1 tablet (5 mg total) by mouth daily.   aspirin EC 81 MG tablet Take 81 mg by mouth daily.   carvedilol 6.25 MG tablet Commonly known as: COREG TAKE 1 TABLET(6.25 MG) BY MOUTH TWICE DAILY WITH A MEAL   diphenhydrAMINE 25 MG tablet Commonly known as: BENADRYL Take 25 mg by mouth daily as needed for itching, allergies or sleep.   DULoxetine 30 MG capsule Commonly known as: CYMBALTA TAKE 1 CAPSULE BY MOUTH DAILY   estradiol 0.1 MG/GM vaginal cream Commonly known as: ESTRACE Estrogen Cream Instruction Discard applicator Apply pea sized amount to tip of finger to urethra before bed. Wash hands well after application. Use Monday, Wednesday and  Friday   losartan 100 MG tablet Commonly known as: COZAAR TAKE 1 TABLET(100 MG) BY MOUTH DAILY IN THE MORNING   mirabegron ER 50 MG Tb24 tablet Commonly known as: MYRBETRIQ Take 1 tablet (50 mg total) by mouth daily.   multivitamin with minerals tablet Take 1 tablet by mouth daily.   OMEGA-3 FISH OIL PO Take 1 capsule by mouth daily.   omeprazole 20 MG capsule Commonly known as: PRILOSEC TAKE 1 CAPSULE BY MOUTH DAILY AS NEEDED FOR STOMACH ACID OR REFLUX   pregabalin 150 MG capsule Commonly known as: Lyrica Take 1 capsule (150 mg total) by mouth 2 (two) times daily.   rosuvastatin 10 MG tablet Commonly known as: CRESTOR TAKE 1 TABLET(10 MG) BY MOUTH DAILY   solifenacin 10 MG tablet Commonly known as: VESICARE Take 1 tablet (10 mg total) by mouth daily.   traZODone 150 MG tablet Commonly known as: DESYREL Take  1 tablet (150 mg total) by mouth at bedtime.        Allergies:  Allergies  Allergen Reactions   Lisinopril     Cough    Shellfish Allergy    Sulfa Antibiotics     hives   Sulfonamide Derivatives     Family History: Family History  Problem Relation Age of Onset   Ovarian cancer Mother    Prostate cancer Father    Coronary artery disease Father    Diabetes Father    Heart attack Father    Heart attack Brother    Breast cancer Neg Hx     Social History:  reports that she has quit smoking. She has been exposed to tobacco smoke. She has never used smokeless tobacco. She reports current alcohol use. She reports that she does not use drugs.  ROS:                                        Physical Exam: BP 136/81   Pulse 65   Ht '5\' 7"'$  (1.702 m)   Wt 90.7 kg   BMI 31.32 kg/m   Constitutional:  Alert and oriented, No acute distress. HEENT: [place] AT, moist mucus membranes.  Trachea midline, no masses.   Laboratory Data: Lab Results  Component Value Date   WBC 4.2 11/04/2019   HGB 14.3 11/04/2019   HCT 43.0 11/04/2019   MCV 95.2 11/04/2019   PLT 167.0 11/04/2019    Lab Results  Component Value Date   CREATININE 0.71 08/27/2021    No results found for: "PSA"  No results found for: "TESTOSTERONE"  Lab Results  Component Value Date   HGBA1C 5.8 03/05/2021    Urinalysis    Component Value Date/Time   COLORURINE AMBER (A) 08/19/2019 1212   APPEARANCEUR Hazy (A) 11/05/2021 1448   LABSPEC 1.015 08/19/2019 1212   PHURINE  08/19/2019 1212    TEST NOT REPORTED DUE TO COLOR INTERFERENCE OF URINE PIGMENT   GLUCOSEU Negative 11/05/2021 1448   GLUCOSEU NEGATIVE 05/04/2014 1113   HGBUR (A) 08/19/2019 1212    TEST NOT REPORTED DUE TO COLOR INTERFERENCE OF URINE PIGMENT   BILIRUBINUR Negative 11/05/2021 1448   KETONESUR trace (5) (A) 04/07/2020 1428   KETONESUR (A) 08/19/2019 1212    TEST NOT REPORTED DUE TO COLOR INTERFERENCE OF URINE PIGMENT    PROTEINUR Negative 11/05/2021 1448   PROTEINUR (A) 08/19/2019 1212    TEST NOT REPORTED DUE TO COLOR  INTERFERENCE OF URINE PIGMENT   UROBILINOGEN 0.2 04/07/2020 1428   UROBILINOGEN 0.2 05/04/2014 1113   NITRITE Negative 11/05/2021 1448   NITRITE (A) 08/19/2019 1212    TEST NOT REPORTED DUE TO COLOR INTERFERENCE OF URINE PIGMENT   LEUKOCYTESUR Trace (A) 11/05/2021 1448   LEUKOCYTESUR (A) 08/19/2019 1212    TEST NOT REPORTED DUE TO COLOR INTERFERENCE OF URINE PIGMENT    Pertinent Imaging:   Assessment & Plan: Stay on the estrogen.  Stop Vesicare 10 mg and stay on Myrbetriq.  Follow-up as scheduled.  By history she was doing well on Myrbetriq and it is little side effects  1. Mixed incontinence  - Urinalysis, Complete   No follow-ups on file.  Reece Packer, MD  [facility] [address] [place], [place] [address] [phone]

## 2022-06-17 NOTE — Addendum Note (Signed)
Addended by: Despina Hidden on: 06/17/2022 11:50 AM   Modules accepted: Orders

## 2022-06-18 LAB — URINALYSIS, COMPLETE
Bilirubin, UA: NEGATIVE
Glucose, UA: NEGATIVE
Ketones, UA: NEGATIVE
Nitrite, UA: NEGATIVE
Specific Gravity, UA: 1.025 (ref 1.005–1.030)
Urobilinogen, Ur: 0.2 mg/dL (ref 0.2–1.0)
pH, UA: 5.5 (ref 5.0–7.5)

## 2022-06-18 LAB — MICROSCOPIC EXAMINATION: WBC, UA: >30 /hpf — AB (ref 0–5)

## 2022-06-20 ENCOUNTER — Other Ambulatory Visit: Payer: Self-pay | Admitting: Family Medicine

## 2022-06-20 ENCOUNTER — Other Ambulatory Visit: Payer: Self-pay | Admitting: *Deleted

## 2022-06-20 DIAGNOSIS — I10 Essential (primary) hypertension: Secondary | ICD-10-CM

## 2022-06-20 LAB — CULTURE, URINE COMPREHENSIVE

## 2022-06-20 MED ORDER — NITROFURANTOIN MONOHYD MACRO 100 MG PO CAPS: 100.0000 mg | ORAL_CAPSULE | Freq: Two times a day (BID) | ORAL | 0 refills | Status: AC

## 2022-07-17 DIAGNOSIS — M1711 Unilateral primary osteoarthritis, right knee: Secondary | ICD-10-CM | POA: Diagnosis not present

## 2022-07-17 DIAGNOSIS — M25561 Pain in right knee: Secondary | ICD-10-CM | POA: Diagnosis not present

## 2022-07-17 DIAGNOSIS — M25562 Pain in left knee: Secondary | ICD-10-CM | POA: Diagnosis not present

## 2022-07-17 DIAGNOSIS — M170 Bilateral primary osteoarthritis of knee: Secondary | ICD-10-CM | POA: Diagnosis not present

## 2022-07-19 ENCOUNTER — Telehealth: Payer: Self-pay | Admitting: Family Medicine

## 2022-07-19 NOTE — Telephone Encounter (Signed)
Clearance paperwork received. She needs an appointment for clearance. Please get her scheduled.

## 2022-07-22 NOTE — Telephone Encounter (Signed)
Patient is scheduled to see provider on 11/16 for medical clearance.  Marylouise Mallet,cma

## 2022-07-25 ENCOUNTER — Encounter: Payer: Self-pay | Admitting: Family Medicine

## 2022-07-25 ENCOUNTER — Ambulatory Visit (INDEPENDENT_AMBULATORY_CARE_PROVIDER_SITE_OTHER): Payer: Medicare Other | Admitting: Family Medicine

## 2022-07-25 VITALS — BP 118/78 | HR 72 | Temp 98.1°F | Ht 66.0 in | Wt 205.2 lb

## 2022-07-25 DIAGNOSIS — Z6833 Body mass index (BMI) 33.0-33.9, adult: Secondary | ICD-10-CM

## 2022-07-25 DIAGNOSIS — Z01818 Encounter for other preprocedural examination: Secondary | ICD-10-CM | POA: Insufficient documentation

## 2022-07-25 DIAGNOSIS — E782 Mixed hyperlipidemia: Secondary | ICD-10-CM | POA: Diagnosis not present

## 2022-07-25 DIAGNOSIS — E6609 Other obesity due to excess calories: Secondary | ICD-10-CM

## 2022-07-25 DIAGNOSIS — I10 Essential (primary) hypertension: Secondary | ICD-10-CM | POA: Diagnosis not present

## 2022-07-25 DIAGNOSIS — R7303 Prediabetes: Secondary | ICD-10-CM

## 2022-07-25 DIAGNOSIS — R0609 Other forms of dyspnea: Secondary | ICD-10-CM | POA: Diagnosis not present

## 2022-07-25 NOTE — Patient Instructions (Signed)
We will refer you to cardiology for preop evaluation.  We will contact you with your lab results.

## 2022-07-25 NOTE — Assessment & Plan Note (Addendum)
Referring to cardiology given reported dyspnea on exertion.  Her dyspnea could be deconditioning related though needs to see cardiology for possible further evaluation.  Discussed she would require cardiac clearance.  We will get lab work today and then determine medical clearance.

## 2022-07-25 NOTE — Assessment & Plan Note (Signed)
Encouraged trial of intermittent fasting.  She will maintain her current activity level.

## 2022-07-25 NOTE — Assessment & Plan Note (Signed)
Well-controlled.  She will continue amlodipine 5 mg daily, carvedilol 6.25 mg twice daily, and losartan 100 mg daily.

## 2022-07-25 NOTE — Progress Notes (Signed)
[name], MD Phone: [phone]  Monica Larson is a 76 y.o. female who presents today for f/u.  Patient presents for preop visit.  She notes no chest pain though does get short of breath climbing 2 flights of stairs.  She can walk about half a mile without getting short of breath.  She does some yard work.  Hypertension: Patient has not been checking her blood pressure.  She is taking amlodipine, carvedilol, losartan.  Obesity: Patient notes she is going to start doing intermittent fasting.  She does walk some for exercise and does some yard work.  Social History   Tobacco Use  Smoking Status Former   Passive exposure: Past  Smokeless Tobacco Never  Tobacco Comments   1 cig every 3 months     Current Outpatient Medications on File Prior to Visit  Medication Sig Dispense Refill   albuterol (VENTOLIN HFA) 108 (90 Base) MCG/ACT inhaler INHALE 2 PUFFS INTO THE LUNGS EVERY 6 HOURS AS NEEDED FOR WHEEZING OR SHORTNESS OF BREATH 20.1 g 0   amLODipine (NORVASC) 5 MG tablet Take 1 tablet (5 mg total) by mouth daily. 90 tablet 3   aspirin 81 MG EC tablet Take 81 mg by mouth daily.      carvedilol (COREG) 6.25 MG tablet TAKE 1 TABLET(6.25 MG) BY MOUTH TWICE DAILY WITH A MEAL 180 tablet 1   diphenhydrAMINE (BENADRYL) 25 MG tablet Take 25 mg by mouth daily as needed for itching, allergies or sleep.     DULoxetine (CYMBALTA) 30 MG capsule TAKE 1 CAPSULE BY MOUTH DAILY 90 capsule 0   estradiol (ESTRACE) 0.1 MG/GM vaginal cream Estrogen Cream Instruction Discard applicator Apply pea sized amount to tip of finger to urethra before bed. Wash hands well after application. Use Monday, Wednesday and Friday 42.5 g 12   losartan (COZAAR) 100 MG tablet TAKE 1 TABLET(100 MG) BY MOUTH DAILY IN THE MORNING 90 tablet 1   mirabegron ER (MYRBETRIQ) 50 MG TB24 tablet Take 1 tablet (50 mg total) by mouth daily. 30 tablet 11   Multiple Vitamins-Minerals (MULTIVITAMIN WITH MINERALS) tablet Take 1 tablet by  mouth daily.      Omega-3 Fatty Acids (OMEGA-3 FISH OIL PO) Take 1 capsule by mouth daily.      omeprazole (PRILOSEC) 20 MG capsule TAKE 1 CAPSULE BY MOUTH DAILY AS NEEDED FOR STOMACH ACID OR REFLUX 90 capsule 1   pregabalin (LYRICA) 150 MG capsule Take 1 capsule (150 mg total) by mouth 2 (two) times daily. 180 capsule 1   rosuvastatin (CRESTOR) 10 MG tablet TAKE 1 TABLET(10 MG) BY MOUTH DAILY 90 tablet 1   traZODone (DESYREL) 150 MG tablet Take 1 tablet (150 mg total) by mouth at bedtime. 90 tablet 3   No current facility-administered medications on file prior to visit.     ROS see history of present illness  Objective  Physical Exam Vitals:   07/25/22 1526 07/25/22 1543  BP: 132/78 118/78  Pulse: 72   Temp: 98.1 F (36.7 C)   SpO2: 97%     BP Readings from Last 3 Encounters:  07/25/22 118/78  06/17/22 136/81  03/01/22 97/69   Wt Readings from Last 3 Encounters:  07/25/22 205 lb 3.2 oz (93.1 kg)  06/17/22 200 lb (90.7 kg)  12/17/21 202 lb (91.6 kg)    Physical Exam Constitutional:      General: She is not in acute distress.    Appearance: She is not diaphoretic.  Cardiovascular:  Rate and Rhythm: Normal rate and regular rhythm.     Heart sounds: Normal heart sounds.  Pulmonary:     Effort: Pulmonary effort is normal.     Breath sounds: Normal breath sounds.  Skin:    General: Skin is warm and dry.  Neurological:     Mental Status: She is alert.      Assessment/Plan: Please see individual problem list.  Problem List Items Addressed This Visit     Hypertension (Chronic)    Well-controlled.  She will continue amlodipine 5 mg daily, carvedilol 6.25 mg twice daily, and losartan 100 mg daily.      Obesity (Chronic)    Encouraged trial of intermittent fasting.  She will maintain her current activity level.      Mixed hyperlipidemia   Relevant Orders   Comp Met (CMET)   Lipid panel   Prediabetes   Relevant Orders   HgB A1c   Preoperative  examination - Primary    Referring to cardiology given reported dyspnea on exertion.  Her dyspnea could be deconditioning related though needs to see cardiology for possible further evaluation.  Discussed she would require cardiac clearance.  We will get lab work today and then determine medical clearance.      Other Visit Diagnoses     DOE (dyspnea on exertion)       Relevant Orders   Ambulatory referral to Cardiology   CBC   TSH       Return in about 6 months (around 01/23/2023).   [name], MD [facility]

## 2022-07-26 LAB — LIPID PANEL
Cholesterol: 142 mg/dL (ref 0–200)
HDL: 55.6 mg/dL (ref >39.00)
LDL Cholesterol: 68 mg/dL (ref 0–99)
NonHDL: 86.54
Total CHOL/HDL Ratio: 3
Triglycerides: 94 mg/dL (ref 0.0–149.0)
VLDL: 18.8 mg/dL (ref 0.0–40.0)

## 2022-07-26 LAB — COMPREHENSIVE METABOLIC PANEL
ALT: 17 U/L (ref 0–35)
AST: 17 U/L (ref 0–37)
Albumin: 4.1 g/dL (ref 3.5–5.2)
Alkaline Phosphatase: 66 U/L (ref 39–117)
BUN: 11 mg/dL (ref 6–23)
CO2: 32 mEq/L (ref 19–32)
Calcium: 9.1 mg/dL (ref 8.4–10.5)
Chloride: 103 mEq/L (ref 96–112)
Creatinine, Ser: 0.99 mg/dL (ref 0.40–1.20)
GFR: 55.06 mL/min — ABNORMAL LOW (ref >60.00)
Glucose, Bld: 105 mg/dL — ABNORMAL HIGH (ref 70–99)
Potassium: 4.2 mEq/L (ref 3.5–5.1)
Sodium: 139 mEq/L (ref 135–145)
Total Bilirubin: 0.5 mg/dL (ref 0.2–1.2)
Total Protein: 6.5 g/dL (ref 6.0–8.3)

## 2022-07-26 LAB — CBC
HCT: 41.5 % (ref 36.0–46.0)
Hemoglobin: 14 g/dL (ref 12.0–15.0)
MCHC: 33.7 g/dL (ref 30.0–36.0)
MCV: 94.5 fl (ref 78.0–100.0)
Platelets: 203 10*3/uL (ref 150.0–400.0)
RBC: 4.38 Mil/uL (ref 3.87–5.11)
RDW: 13.5 % (ref 11.5–15.5)
WBC: 6.6 10*3/uL (ref 4.0–10.5)

## 2022-07-26 LAB — HEMOGLOBIN A1C: Hgb A1c MFr Bld: 6.1 % (ref 4.6–6.5)

## 2022-07-26 LAB — TSH: TSH: 1.12 u[IU]/mL (ref 0.35–5.50)

## 2022-07-29 NOTE — Progress Notes (Unsigned)
Cardiology Clinic Note   Patient Name: Monica Larson Date of Encounter: 07/30/2022  Primary Care Provider:  Leone Haven, MD Primary Cardiologist:  None  Patient Profile    76 year old female with a history of anxiety, former smoker, obesity, hyperlipidemia, hypertension, gastroesophageal reflux disease, chronic left-sided chest pain, who presents today for follow-up and cardiac clearance for left knee surgery.  Past Medical History    Past Medical History:  Diagnosis Date   Arthritis    HANDS/NECK/SHOULDERS   Asthma    SEASONAL   Diarrhea    INTERMITTENT CHRONIC   Fatigue    GERD (gastroesophageal reflux disease)    H/O motion sickness 06/17/2016   Headache    SINUS/ ALLERGIES   HLD (hyperlipidemia)    borderline   HTN (hypertension)    CONTROLLED ON MEDS   Motion sickness    Seasonal allergies    Shortness of breath dyspnea    ON EXERTION   Past Surgical History:  Procedure Laterality Date   ABDOMINAL HYSTERECTOMY     BREAST BIOPSY Bilateral    NEG   BREAST EXCISIONAL BIOPSY Bilateral    NEG   CARPAL TUNNEL RELEASE     right wrist   CHOLECYSTECTOMY     COLONOSCOPY N/A 01/31/2016   Procedure: COLONOSCOPY;  Surgeon: Hulen Luster, MD;  Location: [facility];  Service: Gastroenterology;  Laterality: N/A;   EYE SURGERY     muscular as child/ CATARACT BILATERAL   SKIN CANCER EXCISION Right Wrist and thigh (x2)   Unsure of dates and Dermatologist   VESICOVAGINAL FISTULA CLOSURE W/ TAH      Allergies  Allergies  Allergen Reactions   Lisinopril     Cough    Shellfish Allergy    Sulfa Antibiotics     hives   Sulfonamide Derivatives     History of Present Illness    Monica Larson is a 76 year old female with a past medical history of anxiety, former smoker, obesity, hyperlipidemia, gastroesophageal reflux disease, hypertension, and chronic left-sided chest pain.  She had stress testing completed in 2017 that the pharmacological myocardial  perfusion study with no significant ischemia, normal wall motion, EF estimated at 79%, no EKG changes concerning for ischemia at peak stress or in recovery was considered a low risk scan.  Echocardiogram completed in 2017 revealed LVEF of 60-65%, no regional wall motion abnormalities, trivial aortic regurgitation.  Carotid duplex in 2011 revealed no evidence of hemodynamically significant carotid stenosis.  She was last seen in clinic 11/02/2019 by Dr. Rockey Situ with complaints of chest discomfort when walking up hills.  At that time she had her [facility] ordered.  Blood pressure was slightly elevated and she preferred to drop her numbers off with her primary care provider.  She was continued on her current medication regimen without any other changes made.  She returns to clinic today overall she has been doing fairly well.  She does note to some shortness of breath and dyspnea on exertion.  She denies any chest discomfort, dizziness, lightheadedness, syncope/near syncope, or peripheral edema.  She states that she does have to have a knee replacement done 09/03/2022 on the left.  She denies any recent hospitalizations or visits to the emergency department.  Home Medications    Current Outpatient Medications  Medication Sig Dispense Refill   albuterol (VENTOLIN HFA) 108 (90 Base) MCG/ACT inhaler INHALE 2 PUFFS INTO THE LUNGS EVERY 6 HOURS AS NEEDED FOR WHEEZING OR SHORTNESS  OF BREATH 20.1 g 0   amLODipine (NORVASC) 5 MG tablet Take 1 tablet (5 mg total) by mouth daily. 90 tablet 3   aspirin 81 MG EC tablet Take 81 mg by mouth daily.      carvedilol (COREG) 6.25 MG tablet TAKE 1 TABLET(6.25 MG) BY MOUTH TWICE DAILY WITH A MEAL 180 tablet 1   diphenhydrAMINE (BENADRYL) 25 MG tablet Take 25 mg by mouth daily as needed for itching, allergies or sleep.     DULoxetine (CYMBALTA) 30 MG capsule TAKE 1 CAPSULE BY MOUTH DAILY 90 capsule 0   estradiol (ESTRACE) 0.1 MG/GM vaginal cream Estrogen Cream  Instruction Discard applicator Apply pea sized amount to tip of finger to urethra before bed. Wash hands well after application. Use Monday, Wednesday and Friday 42.5 g 12   losartan (COZAAR) 100 MG tablet TAKE 1 TABLET(100 MG) BY MOUTH DAILY IN THE MORNING 90 tablet 1   mirabegron ER (MYRBETRIQ) 50 MG TB24 tablet Take 1 tablet (50 mg total) by mouth daily. 30 tablet 11   Multiple Vitamins-Minerals (MULTIVITAMIN WITH MINERALS) tablet Take 1 tablet by mouth daily.      Omega-3 Fatty Acids (OMEGA-3 FISH OIL PO) Take 1 capsule by mouth daily.      omeprazole (PRILOSEC) 20 MG capsule TAKE 1 CAPSULE BY MOUTH DAILY AS NEEDED FOR STOMACH ACID OR REFLUX 90 capsule 1   pregabalin (LYRICA) 150 MG capsule Take 1 capsule (150 mg total) by mouth 2 (two) times daily. 180 capsule 1   rosuvastatin (CRESTOR) 10 MG tablet TAKE 1 TABLET(10 MG) BY MOUTH DAILY 90 tablet 1   traZODone (DESYREL) 150 MG tablet Take 1 tablet (150 mg total) by mouth at bedtime. 90 tablet 3   No current facility-administered medications for this visit.     Family History    Family History  Problem Relation Age of Onset   Ovarian cancer Mother    Prostate cancer Father    Coronary artery disease Father    Diabetes Father    Heart attack Father    Heart attack Brother    Breast cancer Neg Hx    She indicated that her mother is deceased. She indicated that her father is deceased. She indicated that her brother is deceased. She indicated that the status of her neg hx is unknown.  Social History    Social History   Socioeconomic History   Marital status: Married    Spouse name: Not on file   Number of children: Not on file   Years of education: Not on file   Highest education level: Not on file  Occupational History   Not on file  Tobacco Use   Smoking status: Former    Passive exposure: Past   Smokeless tobacco: Never   Tobacco comments:    1 cig every 3 months   Vaping Use   Vaping Use: Never used  Substance and  Sexual Activity   Alcohol use: Yes    Comment: 1 MIXED DRINK/WEEK   Drug use: No   Sexual activity: Not on file  Other Topics Concern   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    Social Determinants of Health   Financial Resource Strain: Low Risk  (11/06/2021)   Overall Financial Resource Strain (CARDIA)    Difficulty of Paying Living Expenses: Not hard at all  Food Insecurity: No Food Insecurity (11/06/2021)   Hunger Vital Sign    Worried About  Running Out of Food in the Last Year: Never true    [place] in the Last Year: Never true  Transportation Needs: No Transportation Needs (11/06/2021)   PRAPARE - Hydrologist (Medical): No    Lack of Transportation (Non-Medical): No  Physical Activity: Insufficiently Active (11/06/2021)   Exercise Vital Sign    Days of Exercise per Week: 2 days    Minutes of Exercise per Session: 20 min  Stress: No Stress Concern Present (11/06/2021)   [place]    Feeling of Stress : Not at all  Social Connections: Unknown (11/06/2021)   Social Connection and Isolation Panel [NHANES]    Frequency of Communication with Friends and Family: More than three times a week    Frequency of Social Gatherings with Friends and Family: More than three times a week    Attends Religious Services: Not on file    Active Member of Clubs or Organizations: Not on file    Attends Archivist Meetings: Not on file    Marital Status: Married  Intimate Partner Violence: Not At Risk (11/06/2021)   Humiliation, Afraid, Rape, and Kick questionnaire    Fear of Current or Ex-Partner: No    Emotionally Abused: No    Physically Abused: No    Sexually Abused: No     Review of Systems    General:  No chills, fever, night sweats or weight changes.  Endorses fatigue Cardiovascular:  No chest pain, endorses dyspnea dyspnea on  exertion, endorses occasional edema, but denies orthopnea, palpitations, paroxysmal nocturnal dyspnea. Dermatological: No rash, lesions/masses Respiratory: No cough, endorses dyspnea Urologic: No hematuria, dysuria Abdominal:   No nausea, vomiting, diarrhea, bright red blood per rectum, melena, or hematemesis Neurologic:  No visual changes, wkns, changes in mental status. All other systems reviewed and are otherwise negative except as noted above.   Physical Exam    VS:  BP (!) 146/74 (BP Location: Left Arm, Patient Position: Sitting, Cuff Size: Large)   Pulse 69   Ht '5\' 7"'$  (1.702 m)   Wt 208 lb 9.6 oz (94.6 kg)   SpO2 98%   BMI 32.67 kg/m  , BMI Body mass index is 32.67 kg/m.     GEN: Well nourished, well developed, in no acute distress. HEENT: normal.  Glasses on Neck: Supple, no JVD, carotid bruits, or masses. Cardiac: RRR, no murmurs, rubs, or gallops. No clubbing, cyanosis, edema.  Radials/DP/PT 2+ and equal bilaterally.  Respiratory:  Respirations regular and unlabored, clear to auscultation bilaterally. GI: Soft, nontender, nondistended, BS + x 4. MS: no deformity or atrophy. Skin: warm and dry, no rash. Neuro:  Strength and sensation are intact. Psych: Normal affect.  Accessory Clinical Findings    ECG personally reviewed by me today-sinus rhythm with a rate of 69, LVH, left axis deviation- No acute changes  Lab Results  Component Value Date   WBC 6.6 07/25/2022   HGB 14.0 07/25/2022   HCT 41.5 07/25/2022   MCV 94.5 07/25/2022   PLT 203.0 07/25/2022   Lab Results  Component Value Date   CREATININE 0.99 07/25/2022   BUN 11 07/25/2022   NA 139 07/25/2022   K 4.2 07/25/2022   CL 103 07/25/2022   CO2 32 07/25/2022   Lab Results  Component Value Date   ALT 17 07/25/2022   AST 17 07/25/2022   ALKPHOS 66 07/25/2022   BILITOT 0.5 07/25/2022  Lab Results  Component Value Date   CHOL 142 07/25/2022   HDL 55.60 07/25/2022   LDLCALC 68 07/25/2022    LDLDIRECT 72.0 12/10/2017   TRIG 94.0 07/25/2022   CHOLHDL 3 07/25/2022    Lab Results  Component Value Date   HGBA1C 6.1 07/25/2022    Assessment & Plan   1.  Shortness of breath with dyspnea on exertion.  States it is gradually worsening over the last 6 months.  Can also be a component of deconditioning with worsening knee problems and decreasing activity.  Last echocardiogram was completed in 2017 which revealed LVEF 60-65%, no regional wall motion abnormalities.  She has been scheduled for an echocardiogram today for her symptoms and to determine if there have been any changes over the last several years that would complicate her upcoming surgery.  2.  Essential hypertension with blood pressure 150/70 repeat blood pressures 146/74.  She states she is just taken her medication approximately 30 minutes to an hour prior to her arrival.  She is continued on amlodipine 5 mg daily, carvedilol 6.25 mg twice daily, losartan 100 mg daily.   3.  Hyperlipidemia with an LDL of 68.  She is continued on rosuvastatin 10 mg daily.  4.  Aortic atherosclerosis mild to moderate on CT scan.  LDL is at goal of less than 70.  She is continued on aspirin 81 mg daily.  5.  Disposition patient return to clinic to see MD/APP in 6 months or sooner if needed.  If no changes in echocardiogram then clearance can be sent to surgical team for patient to have knee replacement completed.  Emon Miggins, NP 07/30/2022, 10:33 AM

## 2022-07-30 ENCOUNTER — Ambulatory Visit: Payer: Medicare Other | Attending: Cardiology | Admitting: Cardiology

## 2022-07-30 ENCOUNTER — Encounter: Payer: Self-pay | Admitting: Cardiology

## 2022-07-30 VITALS — BP 146/74 | HR 69 | Ht 67.0 in | Wt 208.6 lb

## 2022-07-30 DIAGNOSIS — I10 Essential (primary) hypertension: Secondary | ICD-10-CM | POA: Diagnosis not present

## 2022-07-30 DIAGNOSIS — R0602 Shortness of breath: Secondary | ICD-10-CM

## 2022-07-30 DIAGNOSIS — I700 Atherosclerosis of aorta: Secondary | ICD-10-CM | POA: Diagnosis not present

## 2022-07-30 DIAGNOSIS — E782 Mixed hyperlipidemia: Secondary | ICD-10-CM | POA: Diagnosis not present

## 2022-07-30 NOTE — Patient Instructions (Signed)
Medication Instructions:  No changes at this time.   *If you need a refill on your cardiac medications before your next appointment, please call your pharmacy*   Lab Work: None  If you have labs (blood work) drawn today and your tests are completely normal, you will receive your results only by: [place] (if you have MyChart) OR A paper copy in the mail If you have any lab test that is abnormal or we need to change your treatment, we will call you to review the results.   Testing/Procedures: Your physician has requested that you have an echocardiogram. Echocardiography is a painless test that uses sound waves to create images of your heart. It provides your doctor with information about the size and shape of your heart and how well your heart's chambers and valves are working. This procedure takes approximately one hour. There are no restrictions for this procedure. Please do NOT wear cologne, perfume, aftershave, or lotions (deodorant is allowed). Please arrive 15 minutes prior to your appointment time.   Follow-Up: At [facility], you and your health needs are our priority.  As part of our continuing mission to provide you with exceptional heart care, we have created designated Provider Care Teams.  These Care Teams include your primary Cardiologist (physician) and Advanced Practice Providers (APPs -  Physician Assistants and Nurse Practitioners) who all work together to provide you with the care you need, when you need it.   Your next appointment:   6 month(s)  The format for your next appointment:   In Person  Provider:   Ida Rogue, MD or Gerrie Nordmann, NP        Important Information About Sugar

## 2022-08-15 NOTE — Patient Instructions (Addendum)
SURGICAL WAITING ROOM VISITATION Patients having surgery or a procedure may have no more than 2 support people in the waiting area - these visitors may rotate.   Children under the age of 92 must have an adult with them who is not the patient. If the patient needs to stay at the hospital during part of their recovery, the visitor guidelines for inpatient rooms apply. Pre-op nurse will coordinate an appropriate time for 1 support person to accompany patient in pre-op.  This support person may not rotate.    Please refer to the [facility] website for the visitor guidelines for Inpatients (after your surgery is over and you are in a regular room).    Your procedure is scheduled on: 09/03/22   Report to [facility] Main Entrance    Report to admitting at 11:50 AM   Call this number if you have problems the morning of surgery [phone]   Do not eat food :After Midnight.   After Midnight you may have the following liquids until 11:20 AM DAY OF SURGERY  Water Non-Citrus Juices (without pulp, NO RED) Carbonated Beverages Black Coffee (NO MILK/CREAM OR CREAMERS, sugar ok)  Clear Tea (NO MILK/CREAM OR CREAMERS, sugar ok) regular and decaf                             Plain Jell-O (NO RED)                                           Fruit ices (not with fruit pulp, NO RED)                                     Popsicles (NO RED)                                                               Sports drinks like Gatorade (NO RED)                 The day of surgery:  Drink ONE (1) Pre-Surgery G2 at 11:20 AM the morning of surgery. Drink in one sitting. Do not sip.  This drink was given to you during your hospital  pre-op appointment visit. Nothing else to drink after completing the  Pre-Surgery G2.          If you have questions, please contact your surgeon's office.   FOLLOW BOWEL PREP AND ANY ADDITIONAL PRE OP INSTRUCTIONS YOU RECEIVED FROM YOUR SURGEON'S OFFICE!!!     Oral  Hygiene is also important to reduce your risk of infection.                                    Remember - BRUSH YOUR TEETH THE MORNING OF SURGERY WITH YOUR REGULAR TOOTHPASTE  DENTURES WILL BE REMOVED PRIOR TO SURGERY PLEASE DO NOT APPLY "Poly grip" OR ADHESIVES!!!   Take these medicines the morning of surgery with A SIP OF WATER: Inhalers, Amlodipine, Carvedilol, Duloxetine, Omeprazole, Lyrica,  Rosuvastatin, Tylenol                               You may not have any metal on your body including hair pins, jewelry, and body piercing             Do not wear make-up, lotions, powders, perfumes, or deodorant  Do not wear nail polish including gel and S&S, artificial/acrylic nails, or any other type of covering on natural nails including finger and toenails. If you have artificial nails, gel coating, etc. that needs to be removed by a nail salon please have this removed prior to surgery or surgery may need to be canceled/ delayed if the surgeon/ anesthesia feels like they are unable to be safely monitored.   Do not shave  48 hours prior to surgery.    Do not bring valuables to the hospital. [place].   Contacts, glasses, dentures or bridgework may not be worn into surgery.   Bring small overnight bag day of surgery.   DO NOT [place]. PHARMACY WILL DISPENSE MEDICATIONS LISTED ON YOUR MEDICATION LIST TO YOU DURING YOUR ADMISSION [place]!   Special Instructions: Bring a copy of your healthcare power of attorney and living will documents the day of surgery if you haven't scanned them before.              Please read over the following fact sheets you were given: IF [place] [phone] Schneiders    If you received a COVID test during your pre-op visit  it is requested that you wear a mask when out in public, stay away from anyone that may not be  feeling well and notify your surgeon if you develop symptoms. If you test positive for Covid or have been in contact with anyone that has tested positive in the last 10 days please notify you surgeon.    [place] - Preparing for Surgery Before surgery, you can play an important role.  Because skin is not sterile, your skin needs to be as free of germs as possible.  You can reduce the number of germs on your skin by washing with CHG (chlorahexidine gluconate) soap before surgery.  CHG is an antiseptic cleaner which kills germs and bonds with the skin to continue killing germs even after washing. Please DO NOT use if you have an allergy to CHG or antibacterial soaps.  If your skin becomes reddened/irritated stop using the CHG and inform your nurse when you arrive at Short Stay. Do not shave (including legs and underarms) for at least 48 hours prior to the first CHG shower.  You may shave your face/neck.  Please follow these instructions carefully:  1.  Shower with CHG Soap the night before surgery and the  morning of surgery.  2.  If you choose to wash your hair, wash your hair first as usual with your normal  shampoo.  3.  After you shampoo, rinse your hair and body thoroughly to remove the shampoo.                             4.  Use CHG as you would any other liquid soap.  You can apply  chg directly to the skin and wash.  Gently with a scrungie or clean washcloth.  5.  Apply the CHG Soap to your body ONLY FROM THE NECK DOWN.   Do   not use on face/ open                           Wound or open sores. Avoid contact with eyes, ears mouth and   genitals (private parts).                       Wash face,  Genitals (private parts) with your normal soap.             6.  Wash thoroughly, paying special attention to the area where your    surgery  will be performed.  7.  Thoroughly rinse your body with warm water from the neck down.  8.  DO NOT shower/wash with your normal soap after using and rinsing off  the CHG Soap.                9.  Pat yourself dry with a clean towel.            10.  Wear clean pajamas.            11.  Place clean sheets on your bed the night of your first shower and do not  sleep with pets. Day of Surgery : Do not apply any lotions/deodorants the morning of surgery.  Please wear clean clothes to the hospital/surgery center.  FAILURE TO FOLLOW THESE INSTRUCTIONS MAY RESULT IN THE CANCELLATION OF YOUR SURGERY  PATIENT SIGNATURE_________________________________  NURSE SIGNATURE__________________________________  ________________________________________________________________________  Adam Phenix  An incentive spirometer is a tool that can help keep your lungs clear and active. This tool measures how well you are filling your lungs with each breath. Taking long deep breaths may help reverse or decrease the chance of developing breathing (pulmonary) problems (especially infection) following: A long period of time when you are unable to move or be active. BEFORE THE PROCEDURE  If the spirometer includes an indicator to show your best effort, your nurse or respiratory therapist will set it to a desired goal. If possible, sit up straight or lean slightly forward. Try not to slouch. Hold the incentive spirometer in an upright position. INSTRUCTIONS FOR USE  Sit on the edge of your bed if possible, or sit up as far as you can in bed or on a chair. Hold the incentive spirometer in an upright position. Breathe out normally. Place the mouthpiece in your mouth and seal your lips tightly around it. Breathe in slowly and as deeply as possible, raising the piston or the ball toward the top of the column. Hold your breath for 3-5 seconds or for as long as possible. Allow the piston or ball to fall to the bottom of the column. Remove the mouthpiece from your mouth and breathe out normally. Rest for a few seconds and repeat Steps 1 through 7 at least 10 times every 1-2  hours when you are awake. Take your time and take a few normal breaths between deep breaths. The spirometer may include an indicator to show your best effort. Use the indicator as a goal to work toward during each repetition. After each set of 10 deep breaths, practice coughing to be sure your lungs are clear. If you have an incision (the cut made at the time of  surgery), support your incision when coughing by placing a pillow or rolled up towels firmly against it. Once you are able to get out of bed, walk around indoors and cough well. You may stop using the incentive spirometer when instructed by your caregiver.  RISKS AND COMPLICATIONS Take your time so you do not get dizzy or light-headed. If you are in pain, you may need to take or ask for pain medication before doing incentive spirometry. It is harder to take a deep breath if you are having pain. AFTER USE Rest and breathe slowly and easily. It can be helpful to keep track of a log of your progress. Your caregiver can provide you with a simple table to help with this. If you are using the spirometer at home, follow these instructions: [place] IF:  You are having difficultly using the spirometer. You have trouble using the spirometer as often as instructed. Your pain medication is not giving enough relief while using the spirometer. You develop fever of 100.5 F (38.1 C) or higher. SEEK IMMEDIATE MEDICAL CARE IF:  You cough up bloody sputum that had not been present before. You develop fever of 102 F (38.9 C) or greater. You develop worsening pain at or near the incision site. MAKE SURE YOU:  Understand these instructions. Will watch your condition. Will get help right away if you are not doing well or get worse. Document Released: 01/06/2007 Document Revised: 11/18/2011 Document Reviewed: 03/09/2007 [facility] Patient Information 2014 [place],  [place].   ________________________________________________________________________

## 2022-08-15 NOTE — Progress Notes (Addendum)
COVID Vaccine Completed: yes  Date of COVID positive in last 90 days: no  PCP - [name], MD Cardiologist - Dr. Mickle Plumb  Cardiac clearance pending ECHO  Chest x-ray - n/a EKG - 07/30/22 Epic Stress Test - 11/05/19 Epic ECHO - scheduled 08/29/22 Cardiac Cath - n/a Pacemaker/ICD device last checked: n/a Spinal Cord Stimulator: n/a  Bowel Prep - no  Sleep Study - n/a CPAP -   Fasting Blood Sugar - pre DM, no checks at home  Checks Blood Sugar _____ times a day  Last dose of GLP1 agonist-  N/A GLP1 instructions:  N/A   Last dose of SGLT-2 inhibitors-  N/A SGLT-2 instructions: N/A   Blood Thinner Instructions: Aspirin Instructions: ASA 81, on hold surgery Last Dose: 2 weeks ago  Activity level: Can go up a flight of stairs and perform activities of daily living without stopping and without symptoms of chest pain. SOB with walking he dog   Anesthesia review: HTN, aortic atherosclerosis, SOB  Patient denies shortness of breath, fever, cough and chest pain at PAT appointment  Patient verbalized understanding of instructions that were given to them at the PAT appointment. Patient was also instructed that they will need to review over the PAT instructions again at home before surgery.

## 2022-08-21 ENCOUNTER — Telehealth: Payer: Self-pay | Admitting: Cardiology

## 2022-08-21 NOTE — Telephone Encounter (Signed)
   Pre-operative Risk Assessment    Patient Name: Monica Larson  DOB: 1946-01-07 MRN: [id number]      Request for Surgical Clearance    Procedure:   LEFT TOTAL KNEE ARTHROPLASTY  Date of Surgery:  Clearance 09/03/22                               Surgeon:  DR Paralee Cancel Surgeon's Group or Practice Name:  [facility] Phone number:  [phone] Fax number:  [phone]  Type of Clearance Requested:   - Medical    Type of Anesthesia:  Spinal   Additional requests/questions:    Signed, Eli Phillips   08/21/2022, 11:05 AM

## 2022-08-26 ENCOUNTER — Other Ambulatory Visit: Payer: Self-pay | Admitting: Family Medicine

## 2022-08-26 DIAGNOSIS — R319 Hematuria, unspecified: Secondary | ICD-10-CM

## 2022-08-26 NOTE — Telephone Encounter (Signed)
Pt need a refill on pregabalin and duloxetine sent to [facility] in [place]

## 2022-08-27 ENCOUNTER — Encounter (HOSPITAL_COMMUNITY)
Admission: RE | Admit: 2022-08-27 | Discharge: 2022-08-27 | Disposition: A | Discharge status: Home / Self Care | Payer: Medicare Other | Source: Ambulatory Visit | Attending: Orthopedic Surgery | Admitting: Orthopedic Surgery

## 2022-08-27 ENCOUNTER — Encounter (HOSPITAL_COMMUNITY): Payer: Self-pay

## 2022-08-27 VITALS — BP 175/68 | HR 65 | Temp 97.9°F | Resp 16 | Ht 67.0 in | Wt 200.0 lb

## 2022-08-27 DIAGNOSIS — Z01812 Encounter for preprocedural laboratory examination: Secondary | ICD-10-CM | POA: Insufficient documentation

## 2022-08-27 DIAGNOSIS — Z01818 Encounter for other preprocedural examination: Secondary | ICD-10-CM

## 2022-08-27 DIAGNOSIS — I700 Atherosclerosis of aorta: Secondary | ICD-10-CM | POA: Diagnosis not present

## 2022-08-27 DIAGNOSIS — R7303 Prediabetes: Secondary | ICD-10-CM | POA: Diagnosis not present

## 2022-08-27 HISTORY — DX: Malignant (primary) neoplasm, unspecified: C80.1

## 2022-08-27 LAB — BASIC METABOLIC PANEL
Anion gap: 5 (ref 5–15)
BUN: 13 mg/dL (ref 8–23)
CO2: 29 mmol/L (ref 22–32)
Calcium: 9.2 mg/dL (ref 8.9–10.3)
Chloride: 110 mmol/L (ref 98–111)
Creatinine, Ser: 0.87 mg/dL (ref 0.44–1.00)
GFR, Estimated: >60 mL/min (ref >60)
Glucose, Bld: 133 mg/dL — ABNORMAL HIGH (ref 70–99)
Potassium: 3.9 mmol/L (ref 3.5–5.1)
Sodium: 144 mmol/L (ref 135–145)

## 2022-08-27 LAB — CBC
HCT: 45.7 % (ref 36.0–46.0)
Hemoglobin: 14.5 g/dL (ref 12.0–15.0)
MCH: 31.2 pg (ref 26.0–34.0)
MCHC: 31.7 g/dL (ref 30.0–36.0)
MCV: 98.3 fL (ref 80.0–100.0)
Platelets: 202 10*3/uL (ref 150–400)
RBC: 4.65 MIL/uL (ref 3.87–5.11)
RDW: 13 % (ref 11.5–15.5)
WBC: 6.7 10*3/uL (ref 4.0–10.5)
nRBC: 0 % (ref 0.0–0.2)

## 2022-08-27 LAB — GLUCOSE, CAPILLARY: Glucose-Capillary: 131 mg/dL — ABNORMAL HIGH (ref 70–99)

## 2022-08-27 LAB — SURGICAL PCR SCREEN
MRSA, PCR: NEGATIVE
Staphylococcus aureus: NEGATIVE

## 2022-08-27 MED ORDER — DULOXETINE HCL 30 MG PO CPEP: 30.0000 mg | ORAL_CAPSULE | Freq: Every day | ORAL | 1 refills | Status: DC

## 2022-08-27 NOTE — Telephone Encounter (Signed)
Lat OV 07/25/22 okay to fill Lyrica? Last filled 6 moth supply 11/05/21.

## 2022-08-28 ENCOUNTER — Telehealth: Payer: Self-pay | Admitting: Family Medicine

## 2022-08-28 ENCOUNTER — Other Ambulatory Visit: Payer: Self-pay

## 2022-08-28 NOTE — Telephone Encounter (Signed)
Please see the other refill request for lyrica. Is she still taking this and what dose has she been taking?

## 2022-08-28 NOTE — Telephone Encounter (Signed)
Please contact the patient and see if she has been taking the Lyrica.  It has not been filled in several months.  Thanks.

## 2022-08-28 NOTE — Telephone Encounter (Signed)
Pt need a refill on pregabalin sent to [facility].

## 2022-08-28 NOTE — Telephone Encounter (Signed)
Patient requesting Pregabalin refill.

## 2022-08-29 ENCOUNTER — Ambulatory Visit: Payer: Medicare Other | Attending: Cardiology

## 2022-08-29 DIAGNOSIS — R0602 Shortness of breath: Secondary | ICD-10-CM | POA: Diagnosis not present

## 2022-08-29 LAB — ECHOCARDIOGRAM COMPLETE
AR max vel: 1.95 cm2
AV Area VTI: 1.89 cm2
AV Area mean vel: 1.83 cm2
AV Mean grad: 4 mmHg
AV Peak grad: 6.6 mmHg
Ao pk vel: 1.28 m/s
Area-P 1/2: 3.27 cm2
S' Lateral: 2.3 cm

## 2022-08-29 MED ORDER — PREGABALIN 150 MG PO CAPS: 150.0000 mg | ORAL_CAPSULE | Freq: Two times a day (BID) | ORAL | 1 refills | Status: DC

## 2022-08-29 NOTE — Telephone Encounter (Signed)
Patient walked in office today to state she needs a refill for her pregabalin (LYRICA) 150 MG capsule.  Patient states she needs it sent to [facility] in [place].  Patient states she has been out of this for four days.

## 2022-08-29 NOTE — Telephone Encounter (Signed)
Sent to pharmacy 

## 2022-09-03 ENCOUNTER — Telehealth: Payer: Self-pay

## 2022-09-03 ENCOUNTER — Encounter (HOSPITAL_COMMUNITY): Payer: Self-pay | Admitting: Orthopedic Surgery

## 2022-09-03 ENCOUNTER — Other Ambulatory Visit: Payer: Self-pay

## 2022-09-03 ENCOUNTER — Ambulatory Visit (HOSPITAL_COMMUNITY): Payer: Medicare Other | Admitting: Physician Assistant

## 2022-09-03 ENCOUNTER — Observation Stay (HOSPITAL_COMMUNITY)
Admission: RE | Admit: 2022-09-03 | Discharge: 2022-09-04 | Disposition: A | Discharge status: Home / Self Care | Payer: Medicare Other | Attending: Orthopedic Surgery | Admitting: Orthopedic Surgery

## 2022-09-03 ENCOUNTER — Encounter (HOSPITAL_COMMUNITY)
Admission: RE | Disposition: A | Discharge status: Home / Self Care | Payer: Self-pay | Source: Home / Self Care | Attending: Orthopedic Surgery

## 2022-09-03 ENCOUNTER — Ambulatory Visit (HOSPITAL_BASED_OUTPATIENT_CLINIC_OR_DEPARTMENT_OTHER): Payer: Medicare Other | Admitting: Certified Registered Nurse Anesthetist

## 2022-09-03 DIAGNOSIS — Z87891 Personal history of nicotine dependence: Secondary | ICD-10-CM | POA: Diagnosis not present

## 2022-09-03 DIAGNOSIS — Z96652 Presence of left artificial knee joint: Secondary | ICD-10-CM

## 2022-09-03 DIAGNOSIS — Z79899 Other long term (current) drug therapy: Secondary | ICD-10-CM | POA: Insufficient documentation

## 2022-09-03 DIAGNOSIS — M1712 Unilateral primary osteoarthritis, left knee: Secondary | ICD-10-CM

## 2022-09-03 DIAGNOSIS — Z85828 Personal history of other malignant neoplasm of skin: Secondary | ICD-10-CM | POA: Diagnosis not present

## 2022-09-03 DIAGNOSIS — G8918 Other acute postprocedural pain: Secondary | ICD-10-CM | POA: Diagnosis not present

## 2022-09-03 DIAGNOSIS — M25762 Osteophyte, left knee: Secondary | ICD-10-CM | POA: Diagnosis not present

## 2022-09-03 DIAGNOSIS — M25462 Effusion, left knee: Secondary | ICD-10-CM

## 2022-09-03 DIAGNOSIS — M659 Synovitis and tenosynovitis, unspecified: Secondary | ICD-10-CM | POA: Diagnosis not present

## 2022-09-03 DIAGNOSIS — I10 Essential (primary) hypertension: Secondary | ICD-10-CM | POA: Diagnosis not present

## 2022-09-03 DIAGNOSIS — J45909 Unspecified asthma, uncomplicated: Secondary | ICD-10-CM | POA: Insufficient documentation

## 2022-09-03 DIAGNOSIS — R7303 Prediabetes: Secondary | ICD-10-CM

## 2022-09-03 HISTORY — PX: TOTAL KNEE ARTHROPLASTY: SHX125

## 2022-09-03 LAB — GLUCOSE, CAPILLARY: Glucose-Capillary: 108 mg/dL — ABNORMAL HIGH (ref 70–99)

## 2022-09-03 SURGERY — ARTHROPLASTY, KNEE, TOTAL
Anesthesia: Spinal | Site: Knee | Laterality: Left

## 2022-09-03 MED ORDER — BUPIVACAINE-EPINEPHRINE (PF) 0.25% -1:200000 IJ SOLN
INTRAMUSCULAR | Status: DC | PRN
  Administered 2022-09-03: 30 mL

## 2022-09-03 MED ORDER — ALBUTEROL SULFATE (2.5 MG/3ML) 0.083% IN NEBU: 3.0000 mL | INHALATION_SOLUTION | Freq: Four times a day (QID) | RESPIRATORY_TRACT | Status: DC | PRN

## 2022-09-03 MED ORDER — ONDANSETRON HCL 4 MG/2ML IJ SOLN: 4.0000 mg | Freq: Four times a day (QID) | INTRAMUSCULAR | Status: DC | PRN

## 2022-09-03 MED ORDER — CHLORHEXIDINE GLUCONATE 0.12 % MT SOLN
15.0000 mL | Freq: Once | OROMUCOSAL | Status: AC
  Administered 2022-09-03: 15 mL via OROMUCOSAL

## 2022-09-03 MED ORDER — POLYETHYLENE GLYCOL 3350 17 G PO PACK
17.0000 g | PACK | Freq: Two times a day (BID) | ORAL | Status: DC
  Administered 2022-09-03 – 2022-09-04 (×2): 17 g via ORAL
  Filled 2022-09-03 (×2): qty 1

## 2022-09-03 MED ORDER — 0.9 % SODIUM CHLORIDE (POUR BTL) OPTIME
TOPICAL | Status: DC | PRN
  Administered 2022-09-03: 2000 mL

## 2022-09-03 MED ORDER — ROSUVASTATIN CALCIUM 10 MG PO TABS
10.0000 mg | ORAL_TABLET | Freq: Every day | ORAL | Status: DC
  Administered 2022-09-04: 10 mg via ORAL
  Filled 2022-09-03: qty 1

## 2022-09-03 MED ORDER — ROPIVACAINE HCL 5 MG/ML IJ SOLN
INTRAMUSCULAR | Status: DC | PRN
  Administered 2022-09-03 (×6): 5 mL via PERINEURAL

## 2022-09-03 MED ORDER — BUPIVACAINE-EPINEPHRINE (PF) 0.5% -1:200000 IJ SOLN
INTRAMUSCULAR | Status: AC
  Filled 2022-09-03: qty 30

## 2022-09-03 MED ORDER — ONDANSETRON HCL 4 MG/2ML IJ SOLN
INTRAMUSCULAR | Status: DC | PRN
  Administered 2022-09-03: 4 mg via INTRAVENOUS

## 2022-09-03 MED ORDER — METOCLOPRAMIDE HCL 5 MG/ML IJ SOLN: 5.0000 mg | Freq: Three times a day (TID) | INTRAMUSCULAR | Status: DC | PRN

## 2022-09-03 MED ORDER — TRANEXAMIC ACID-NACL 1000-0.7 MG/100ML-% IV SOLN
1000.0000 mg | Freq: Once | INTRAVENOUS | Status: AC
  Administered 2022-09-03: 1000 mg via INTRAVENOUS
  Filled 2022-09-03: qty 100

## 2022-09-03 MED ORDER — MIRABEGRON ER 25 MG PO TB24
50.0000 mg | ORAL_TABLET | Freq: Every day | ORAL | Status: DC
  Administered 2022-09-04: 50 mg via ORAL
  Filled 2022-09-03: qty 2

## 2022-09-03 MED ORDER — PROPOFOL 500 MG/50ML IV EMUL
INTRAVENOUS | Status: DC | PRN
  Administered 2022-09-03: 100 ug/kg/min via INTRAVENOUS

## 2022-09-03 MED ORDER — DULOXETINE HCL 30 MG PO CPEP
30.0000 mg | ORAL_CAPSULE | Freq: Every day | ORAL | Status: DC
  Administered 2022-09-04: 30 mg via ORAL
  Filled 2022-09-03: qty 1

## 2022-09-03 MED ORDER — ACETAMINOPHEN 10 MG/ML IV SOLN: 1000.0000 mg | Freq: Once | INTRAVENOUS | Status: DC | PRN

## 2022-09-03 MED ORDER — METHOCARBAMOL 500 MG IVPB - SIMPLE MED: 500.0000 mg | Freq: Four times a day (QID) | INTRAVENOUS | Status: DC | PRN

## 2022-09-03 MED ORDER — ONDANSETRON HCL 4 MG/2ML IJ SOLN: 4.0000 mg | Freq: Once | INTRAMUSCULAR | Status: DC | PRN

## 2022-09-03 MED ORDER — LOSARTAN POTASSIUM 50 MG PO TABS
100.0000 mg | ORAL_TABLET | Freq: Every day | ORAL | Status: DC
  Filled 2022-09-03: qty 2

## 2022-09-03 MED ORDER — DOCUSATE SODIUM 100 MG PO CAPS
100.0000 mg | ORAL_CAPSULE | Freq: Two times a day (BID) | ORAL | Status: DC
  Administered 2022-09-03 – 2022-09-04 (×2): 100 mg via ORAL
  Filled 2022-09-03 (×2): qty 1

## 2022-09-03 MED ORDER — CARVEDILOL 6.25 MG PO TABS
6.2500 mg | ORAL_TABLET | Freq: Two times a day (BID) | ORAL | Status: DC
  Administered 2022-09-04: 6.25 mg via ORAL
  Filled 2022-09-03: qty 1

## 2022-09-03 MED ORDER — HYDROMORPHONE HCL 2 MG PO TABS
1.0000 mg | ORAL_TABLET | ORAL | Status: DC | PRN
  Administered 2022-09-03 – 2022-09-04 (×3): 2 mg via ORAL
  Filled 2022-09-03 (×4): qty 1

## 2022-09-03 MED ORDER — MELOXICAM 15 MG PO TABS
15.0000 mg | ORAL_TABLET | Freq: Every day | ORAL | Status: DC
  Administered 2022-09-04: 15 mg via ORAL
  Filled 2022-09-03: qty 1

## 2022-09-03 MED ORDER — FESOTERODINE FUMARATE ER 4 MG PO TB24
4.0000 mg | ORAL_TABLET | Freq: Every day | ORAL | Status: DC
  Administered 2022-09-03 – 2022-09-04 (×2): 4 mg via ORAL
  Filled 2022-09-03 (×2): qty 1

## 2022-09-03 MED ORDER — POVIDONE-IODINE 10 % EX SWAB: 2.0000 | Freq: Once | CUTANEOUS | Status: DC

## 2022-09-03 MED ORDER — HYDROMORPHONE HCL 1 MG/ML IJ SOLN: 0.5000 mg | INTRAMUSCULAR | Status: DC | PRN

## 2022-09-03 MED ORDER — KETOROLAC TROMETHAMINE 30 MG/ML IJ SOLN
INTRAMUSCULAR | Status: DC | PRN
  Administered 2022-09-03: 30 mg

## 2022-09-03 MED ORDER — FENTANYL CITRATE PF 50 MCG/ML IJ SOSY: 25.0000 ug | PREFILLED_SYRINGE | INTRAMUSCULAR | Status: DC | PRN

## 2022-09-03 MED ORDER — SODIUM CHLORIDE (PF) 0.9 % IJ SOLN
INTRAMUSCULAR | Status: DC | PRN
  Administered 2022-09-03: 50 mL

## 2022-09-03 MED ORDER — METHOCARBAMOL 500 MG PO TABS: 500.0000 mg | ORAL_TABLET | Freq: Four times a day (QID) | ORAL | Status: DC | PRN

## 2022-09-03 MED ORDER — PROPOFOL 10 MG/ML IV BOLUS
INTRAVENOUS | Status: DC | PRN
  Administered 2022-09-03: 30 mg via INTRAVENOUS

## 2022-09-03 MED ORDER — PHENOL 1.4 % MT LIQD: 1.0000 | OROMUCOSAL | Status: DC | PRN

## 2022-09-03 MED ORDER — CLONIDINE HCL (ANALGESIA) 100 MCG/ML EP SOLN
EPIDURAL | Status: DC | PRN
  Administered 2022-09-03: 50 ug

## 2022-09-03 MED ORDER — DEXAMETHASONE SODIUM PHOSPHATE 10 MG/ML IJ SOLN
10.0000 mg | Freq: Once | INTRAMUSCULAR | Status: AC
  Administered 2022-09-04: 10 mg via INTRAVENOUS
  Filled 2022-09-03: qty 1

## 2022-09-03 MED ORDER — ACETAMINOPHEN 325 MG PO TABS: 325.0000 mg | ORAL_TABLET | Freq: Four times a day (QID) | ORAL | Status: DC | PRN

## 2022-09-03 MED ORDER — LACTATED RINGERS IV SOLN: INTRAVENOUS | Status: DC

## 2022-09-03 MED ORDER — FENTANYL CITRATE PF 50 MCG/ML IJ SOSY
25.0000 ug | PREFILLED_SYRINGE | Freq: Once | INTRAMUSCULAR | Status: AC
  Administered 2022-09-03: 50 ug via INTRAVENOUS
  Filled 2022-09-03: qty 2

## 2022-09-03 MED ORDER — AMLODIPINE BESYLATE 5 MG PO TABS
5.0000 mg | ORAL_TABLET | Freq: Every day | ORAL | Status: DC
  Administered 2022-09-04: 5 mg via ORAL

## 2022-09-03 MED ORDER — CEFAZOLIN SODIUM-DEXTROSE 2-4 GM/100ML-% IV SOLN
2.0000 g | INTRAVENOUS | Status: AC
  Administered 2022-09-03: 2 g via INTRAVENOUS
  Filled 2022-09-03: qty 100

## 2022-09-03 MED ORDER — ONDANSETRON HCL 4 MG PO TABS: 4.0000 mg | ORAL_TABLET | Freq: Four times a day (QID) | ORAL | Status: DC | PRN

## 2022-09-03 MED ORDER — PANTOPRAZOLE SODIUM 40 MG PO TBEC
40.0000 mg | DELAYED_RELEASE_TABLET | Freq: Every day | ORAL | Status: DC
  Administered 2022-09-04: 40 mg via ORAL
  Filled 2022-09-03: qty 1

## 2022-09-03 MED ORDER — AZELASTINE HCL 0.1 % NA SOLN: 1.0000 | Freq: Every day | NASAL | Status: DC | PRN

## 2022-09-03 MED ORDER — DIPHENHYDRAMINE HCL 12.5 MG/5ML PO ELIX: 12.5000 mg | ORAL_SOLUTION | ORAL | Status: DC | PRN

## 2022-09-03 MED ORDER — TRANEXAMIC ACID-NACL 1000-0.7 MG/100ML-% IV SOLN
1000.0000 mg | INTRAVENOUS | Status: AC
  Administered 2022-09-03: 1000 mg via INTRAVENOUS
  Filled 2022-09-03: qty 100

## 2022-09-03 MED ORDER — METOCLOPRAMIDE HCL 5 MG PO TABS: 5.0000 mg | ORAL_TABLET | Freq: Three times a day (TID) | ORAL | Status: DC | PRN

## 2022-09-03 MED ORDER — ORAL CARE MOUTH RINSE: 15.0000 mL | Freq: Once | OROMUCOSAL | Status: AC

## 2022-09-03 MED ORDER — DEXAMETHASONE SODIUM PHOSPHATE 10 MG/ML IJ SOLN
8.0000 mg | Freq: Once | INTRAMUSCULAR | Status: AC
  Administered 2022-09-03: 8 mg via INTRAVENOUS

## 2022-09-03 MED ORDER — STERILE WATER FOR IRRIGATION IR SOLN
Status: DC | PRN
  Administered 2022-09-03: 1000 mL

## 2022-09-03 MED ORDER — TRAZODONE HCL 50 MG PO TABS: 150.0000 mg | ORAL_TABLET | Freq: Every evening | ORAL | Status: DC | PRN

## 2022-09-03 MED ORDER — MENTHOL 3 MG MT LOZG: 1.0000 | LOZENGE | OROMUCOSAL | Status: DC | PRN

## 2022-09-03 MED ORDER — SODIUM CHLORIDE 0.9 % IV SOLN: INTRAVENOUS | Status: DC

## 2022-09-03 MED ORDER — KETOROLAC TROMETHAMINE 30 MG/ML IJ SOLN
INTRAMUSCULAR | Status: AC
  Filled 2022-09-03: qty 1

## 2022-09-03 MED ORDER — HYDROMORPHONE HCL 2 MG PO TABS: 2.0000 mg | ORAL_TABLET | ORAL | Status: DC | PRN

## 2022-09-03 MED ORDER — DEXAMETHASONE SODIUM PHOSPHATE 4 MG/ML IJ SOLN
INTRAMUSCULAR | Status: DC | PRN
  Administered 2022-09-03: 4 mg via PERINEURAL

## 2022-09-03 MED ORDER — CEFAZOLIN SODIUM-DEXTROSE 2-4 GM/100ML-% IV SOLN
2.0000 g | Freq: Four times a day (QID) | INTRAVENOUS | Status: AC
  Administered 2022-09-03 – 2022-09-04 (×2): 2 g via INTRAVENOUS
  Filled 2022-09-03 (×2): qty 100

## 2022-09-03 MED ORDER — BUPIVACAINE IN DEXTROSE 0.75-8.25 % IT SOLN
INTRATHECAL | Status: DC | PRN
  Administered 2022-09-03: 1.6 mL via INTRATHECAL

## 2022-09-03 MED ORDER — BISACODYL 10 MG RE SUPP: 10.0000 mg | Freq: Every day | RECTAL | Status: DC | PRN

## 2022-09-03 MED ORDER — ASPIRIN 81 MG PO CHEW
81.0000 mg | CHEWABLE_TABLET | Freq: Two times a day (BID) | ORAL | Status: DC
  Administered 2022-09-03 – 2022-09-04 (×2): 81 mg via ORAL
  Filled 2022-09-03 (×2): qty 1

## 2022-09-03 MED ORDER — ACETAMINOPHEN 500 MG PO TABS
1000.0000 mg | ORAL_TABLET | Freq: Four times a day (QID) | ORAL | Status: DC
  Administered 2022-09-03 – 2022-09-04 (×3): 1000 mg via ORAL
  Filled 2022-09-03 (×3): qty 2

## 2022-09-03 MED ORDER — PREGABALIN 75 MG PO CAPS
150.0000 mg | ORAL_CAPSULE | Freq: Two times a day (BID) | ORAL | Status: DC
  Administered 2022-09-03 – 2022-09-04 (×2): 150 mg via ORAL
  Filled 2022-09-03 (×2): qty 2

## 2022-09-03 MED ORDER — MIDAZOLAM HCL 2 MG/2ML IJ SOLN
0.5000 mg | Freq: Once | INTRAMUSCULAR | Status: AC
  Administered 2022-09-03: 1 mg via INTRAVENOUS
  Filled 2022-09-03: qty 2

## 2022-09-03 SURGICAL SUPPLY — 50 items
ATTUNE MED ANAT PAT 35 KNEE (Knees) IMPLANT
ATTUNE PSFEM LTSZ5 NARCEM KNEE (Femur) IMPLANT
ATTUNE PSRP INSR SZ5 6 KNEE (Insert) IMPLANT
BAG COUNTER SPONGE SURGICOUNT (BAG) IMPLANT
BAG ZIPLOCK 12X15 (MISCELLANEOUS) IMPLANT
BASEPLATE TIBIAL ROTATING SZ 4 (Knees) IMPLANT
BLADE SAW SGTL 11.0X1.19X90.0M (BLADE) IMPLANT
BLADE SAW SGTL 13.0X1.19X90.0M (BLADE) ×1 IMPLANT
BNDG ELASTIC 6X10 VLCR STRL LF (GAUZE/BANDAGES/DRESSINGS) IMPLANT
BNDG ELASTIC 6X5.8 VLCR STR LF (GAUZE/BANDAGES/DRESSINGS) ×1 IMPLANT
BOWL SMART MIX CTS (DISPOSABLE) ×1 IMPLANT
CEMENT HV SMART SET (Cement) IMPLANT
CUFF TOURN SGL QUICK 34 (TOURNIQUET CUFF) ×1
CUFF TRNQT CYL 34X4.125X (TOURNIQUET CUFF) ×1 IMPLANT
DERMABOND ADVANCED .7 DNX12 (GAUZE/BANDAGES/DRESSINGS) ×1 IMPLANT
DRAPE U-SHAPE 47X51 STRL (DRAPES) ×1 IMPLANT
DRESSING AQUACEL AG SP 3.5X10 (GAUZE/BANDAGES/DRESSINGS) ×1 IMPLANT
DRSG AQUACEL AG ADV 3.5X10 (GAUZE/BANDAGES/DRESSINGS) IMPLANT
DRSG AQUACEL AG SP 3.5X10 (GAUZE/BANDAGES/DRESSINGS) ×1
DURAPREP 26ML APPLICATOR (WOUND CARE) ×2 IMPLANT
ELECT REM PT RETURN 15FT ADLT (MISCELLANEOUS) ×1 IMPLANT
GLOVE BIO SURGEON STRL SZ 6 (GLOVE) ×1 IMPLANT
GLOVE BIOGEL PI IND STRL 6.5 (GLOVE) ×1 IMPLANT
GLOVE BIOGEL PI IND STRL 7.5 (GLOVE) ×1 IMPLANT
GLOVE ORTHO TXT STRL SZ7.5 (GLOVE) ×2 IMPLANT
GOWN STRL REUS W/ TWL LRG LVL3 (GOWN DISPOSABLE) ×2 IMPLANT
GOWN STRL REUS W/TWL LRG LVL3 (GOWN DISPOSABLE) ×2
HANDPIECE INTERPULSE COAX TIP (DISPOSABLE) ×1
HOLDER FOLEY CATH W/STRAP (MISCELLANEOUS) IMPLANT
KIT TURNOVER KIT A (KITS) IMPLANT
MANIFOLD NEPTUNE II (INSTRUMENTS) ×1 IMPLANT
NDL SAFETY ECLIP 18X1.5 (MISCELLANEOUS) IMPLANT
NS IRRIG 1000ML POUR BTL (IV SOLUTION) ×1 IMPLANT
PACK TOTAL KNEE CUSTOM (KITS) ×1 IMPLANT
PIN FIX SIGMA LCS THRD HI (PIN) IMPLANT
PROTECTOR NERVE ULNAR (MISCELLANEOUS) ×1 IMPLANT
SET HNDPC FAN SPRY TIP SCT (DISPOSABLE) ×1 IMPLANT
SET PAD KNEE POSITIONER (MISCELLANEOUS) ×1 IMPLANT
SPIKE FLUID TRANSFER (MISCELLANEOUS) ×2 IMPLANT
SUT MNCRL AB 4-0 PS2 18 (SUTURE) ×1 IMPLANT
SUT STRATAFIX PDS+ 0 24IN (SUTURE) ×1 IMPLANT
SUT VIC AB 1 CT1 36 (SUTURE) ×1 IMPLANT
SUT VIC AB 2-0 CT1 27 (SUTURE) ×2
SUT VIC AB 2-0 CT1 TAPERPNT 27 (SUTURE) ×2 IMPLANT
SYR 3ML LL SCALE MARK (SYRINGE) ×1 IMPLANT
TOWEL GREEN STERILE FF (TOWEL DISPOSABLE) ×1 IMPLANT
TRAY FOLEY MTR SLVR 16FR STAT (SET/KITS/TRAYS/PACK) ×1 IMPLANT
TUBE SUCTION HIGH CAP CLEAR NV (SUCTIONS) ×1 IMPLANT
WATER STERILE IRR 1000ML POUR (IV SOLUTION) ×2 IMPLANT
WRAP KNEE MAXI GEL POST OP (GAUZE/BANDAGES/DRESSINGS) ×1 IMPLANT

## 2022-09-03 NOTE — Anesthesia Preprocedure Evaluation (Signed)
Anesthesia Evaluation  Patient identified by MRN, date of birth, ID band Patient awake    Reviewed: Allergy & Precautions, NPO status , Patient's Chart, lab work & pertinent test results, reviewed documented beta blocker date and time   Airway Mallampati: I       Dental no notable dental hx.    Pulmonary asthma , former smoker   Pulmonary exam normal        Cardiovascular hypertension, Pt. on medications and Pt. on home beta blockers Normal cardiovascular exam     Neuro/Psych  PSYCHIATRIC DISORDERS Anxiety Depression       GI/Hepatic Neg liver ROS,GERD  Medicated and Controlled,,  Endo/Other  negative endocrine ROS    Renal/GU negative Renal ROS     Musculoskeletal  (+) Arthritis , Osteoarthritis,    Abdominal  (+) + obese  Peds  Hematology negative hematology ROS (+)   Anesthesia Other Findings  IMPRESSIONS     1. Left ventricular ejection fraction, by estimation, is 60 to 65%. The  left ventricle has normal function. The left ventricle has no regional  wall motion abnormalities. Left ventricular diastolic parameters are  consistent with Grade I diastolic  dysfunction (impaired relaxation).   2. Right ventricular systolic function is normal. The right ventricular  size is normal. Tricuspid regurgitation signal is inadequate for assessing  PA pressure.   3. The mitral valve is normal in structure. No evidence of mitral valve  regurgitation. No evidence of mitral stenosis.   4. The aortic valve is tricuspid. Aortic valve regurgitation is not  visualized. No aortic stenosis is present.   5. The inferior vena cava is normal in size with greater than 50%  respiratory variability, suggesting right atrial pressure of 3 mmHg.   Comparison(s): 2017-EF 60-65%.     Reproductive/Obstetrics                             Anesthesia Physical Anesthesia Plan  ASA: 2  Anesthesia Plan: Spinal    Post-op Pain Management: Regional block*   Induction:   PONV Risk Score and Plan: Propofol infusion, TIVA and Treatment may vary due to age or medical condition  Airway Management Planned: Natural Airway and Simple Face Mask  Additional Equipment: None  Intra-op Plan:   Post-operative Plan:   Informed Consent: I have reviewed the patients History and Physical, chart, labs and discussed the procedure including the risks, benefits and alternatives for the proposed anesthesia with the patient or authorized representative who has indicated his/her understanding and acceptance.       Plan Discussed with: CRNA  Anesthesia Plan Comments:        Anesthesia Quick Evaluation

## 2022-09-03 NOTE — Op Note (Signed)
NAME:  Monica Larson RECORD NO.:  [id number]                             FACILITY:  [facility]      PHYSICIAN:  Pietro Cassis. Alvan Dame, M.D.  DATE OF BIRTH:  10-29-1945      DATE OF PROCEDURE:  09/03/2022                                     OPERATIVE REPORT         PREOPERATIVE DIAGNOSIS:  Left knee osteoarthritis.      POSTOPERATIVE DIAGNOSIS:  Left knee osteoarthritis.      FINDINGS:  The patient was noted to have complete loss of cartilage and   bone-on-bone arthritis with associated osteophytes in the medial and patellofemoral compartments of   the knee with a significant synovitis and associated effusion.  The patient had failed months of conservative treatment including medications, injection therapy, activity modification.     PROCEDURE:  Left total knee replacement.      COMPONENTS USED:  DePuy Attune rotating platform posterior stabilized knee   system, a size 5N femur, 4 tibia, size 6 mm PS AOX insert, and 35 anatomic patellar   button.      SURGEON:  Pietro Cassis. Alvan Dame, M.D.      ASSISTANT:  Costella Hatcher, PA-C.      ANESTHESIA:  Regional and Spinal.      SPECIMENS:  None.      COMPLICATION:  None.      DRAINS:  None.  EBL: <100 cc      TOURNIQUET TIME:  29 min at 250 mmHg     The patient was stable to the recovery room.      INDICATION FOR PROCEDURE:  Monica Larson is a 76 y.o. female patient of   mine.  The patient had been seen, evaluated, and treated for months conservatively in the   office with medication, activity modification, and injections.  The patient had   radiographic changes of bone-on-bone arthritis with endplate sclerosis and osteophytes noted.  Based on the radiographic changes and failed conservative measures, the patient   decided to proceed with definitive treatment, total knee replacement.  Risks of infection, DVT, component failure, need for revision surgery, neurovascular injury were reviewed in the office setting.  The  postop course was reviewed stressing the efforts to maximize post-operative satisfaction and function.  Consent was obtained for benefit of pain   relief.      PROCEDURE IN DETAIL:  The patient was brought to the operative theater.   Once adequate anesthesia, preoperative antibiotics, 2 gm of Ancef,1 gm of Tranexamic Acid, and 10 mg of Decadron administered, the patient was positioned supine with a left thigh tourniquet placed.  The  left lower extremity was prepped and draped in sterile fashion.  A time-   out was performed identifying the patient, planned procedure, and the appropriate extremity.      The left lower extremity was placed in the [facility] leg holder.  The leg was   exsanguinated, tourniquet elevated to 250 mmHg.  A midline incision was   made followed by median parapatellar arthrotomy.  Following initial   exposure, attention was  first directed to the patella.  Precut   measurement was noted to be 22 mm.  I resected down to 13 mm and used a   35 anatomic patellar button to restore patellar height as well as cover the cut surface.      The lug holes were drilled and a metal shim was placed to protect the   patella from retractors and saw blade during the procedure.      At this point, attention was now directed to the femur.  The femoral   canal was opened with a drill, irrigated to try to prevent fat emboli.  An   intramedullary rod was passed at 3 degrees valgus, 9 mm of bone was   resected off the distal femur.  Following this resection, the tibia was   subluxated anteriorly.  Using the extramedullary guide, 2 mm of bone was resected off   the proximal medial tibia.  We confirmed the gap would be   stable medially and laterally with a size 5 spacer block as well as confirmed that the tibial cut was perpendicular in the coronal plane, checking with an alignment rod.      Once this was done, I sized the femur to be a size 5 in the anterior-   posterior dimension, chose a  narrow component based on medial and   lateral dimension.  The size 5 rotation block was then pinned in   position anterior referenced using the C-clamp to set rotation.  The   anterior, posterior, and  chamfer cuts were made without difficulty nor   notching making certain that I was along the anterior cortex to help   with flexion gap stability.      The final box cut was made off the lateral aspect of distal femur.      At this point, the tibia was sized to be a size 4.  The size 4 tray was   then pinned in position through the medial third of the tubercle,   drilled, and keel punched.  Trial reduction was now carried with a 5 femur,  4 tibia, a size 6 mm PS insert, and the 35 anatomic patella botton.  The knee was brought to full extension with good flexion stability with the patella   tracking through the trochlea without application of pressure.  Given   all these findings the trial components removed.  Final components were   opened and cement was mixed.  The knee was irrigated with normal saline solution and pulse lavage.  The synovial lining was   then injected with 30 cc of 0.25% Marcaine with epinephrine, 1 cc of Toradol and 30 cc of NS for a total of 61 cc.     Final implants were then cemented onto cleaned and dried cut surfaces of bone with the knee brought to extension with a size 6 mm PS trial insert.      Once the cement had fully cured, excess cement was removed   throughout the knee.  I confirmed that I was satisfied with the range of   motion and stability, and the final size 6 mm PS AOX insert was chosen.  It was   placed into the knee.      The tourniquet had been let down at 29 minutes.  No significant   hemostasis was required.  The extensor mechanism was then reapproximated using #1 Vicryl and #1 Stratafix sutures with the knee   in flexion.  The  remaining wound was closed with 2-0 Vicryl and running 4-0 Monocryl.   The knee was cleaned, dried, dressed  sterilely using Dermabond and   Aquacel dressing.  The patient was then   brought to recovery room in stable condition, tolerating the procedure   well.   Please note that Physician Assistant, Costella Hatcher, PA-C was present for the entirety of the case, and was utilized for pre-operative positioning, peri-operative retractor management, general facilitation of the procedure and for primary wound closure at the end of the case.              Pietro Cassis Alvan Dame, M.D.    09/03/2022 4:07 PM

## 2022-09-03 NOTE — Anesthesia Procedure Notes (Signed)
Anesthesia Regional Block: Adductor canal block   Pre-Anesthetic Checklist: , timeout performed,  Correct Patient, Correct Site, Correct Laterality,  Correct Procedure, Correct Position, site marked,  Risks and benefits discussed,  Surgical consent,  Pre-op evaluation,  At surgeon's request and post-op pain management  Laterality: Lower and Left  Prep: chloraprep       Needles:  Injection technique: Single-shot  Needle Type: Echogenic Stimulator Needle     Needle Length: 9cm  Needle Gauge: 20   Needle insertion depth: 3.5 cm   Additional Needles:   Procedures:,,,, ultrasound used (permanent image in chart),,    Narrative:  Start time: 09/03/2022 2:13 PM End time: 09/03/2022 2:21 PM Injection made incrementally with aspirations every 5 mL. Anesthesiologist: Lyn Hollingshead, MD

## 2022-09-03 NOTE — Plan of Care (Signed)
  Problem: Education: Goal: Knowledge of the prescribed therapeutic regimen will improve Outcome: Progressing   Problem: Activity: Goal: Ability to avoid complications of mobility impairment will improve Outcome: Progressing   Problem: Pain Management: Goal: Pain level will decrease with appropriate interventions Outcome: Progressing   

## 2022-09-03 NOTE — Discharge Instructions (Signed)

## 2022-09-03 NOTE — Anesthesia Procedure Notes (Signed)
Spinal  Patient location during procedure: OR End time: 09/03/2022 2:48 PM Reason for block: surgical anesthesia Staffing Performed: resident/CRNA  Resident/CRNA: Noralyn Pick D, CRNA Performed by: Lulabelle Desta, Clinical cytogeneticist D, CRNA Authorized by: Lyn Hollingshead, MD   Preanesthetic Checklist Completed: patient identified, IV checked, site marked, risks and benefits discussed, surgical consent, monitors and equipment checked, pre-op evaluation and timeout performed Spinal Block Patient position: sitting Prep: DuraPrep Patient monitoring: heart rate, continuous pulse ox and blood pressure Approach: midline Location: L3-4 Injection technique: single-shot Needle Needle type: Pencan  Needle gauge: 24 G Needle length: 9 cm Assessment Sensory level: T6 Events: CSF return

## 2022-09-03 NOTE — Interval H&P Note (Signed)
History and Physical Interval Note:  09/03/2022 1:26 PM  Monica Larson  has presented today for surgery, with the diagnosis of Left knee osteoarthritis.  The various methods of treatment have been discussed with the patient and family. After consideration of risks, benefits and other options for treatment, the patient has consented to  Procedure(s): TOTAL KNEE ARTHROPLASTY (Left) as a surgical intervention.  The patient's history has been reviewed, patient examined, no change in status, stable for surgery.  I have reviewed the patient's chart and labs.  Questions were answered to the patient's satisfaction.     Mauri Pole

## 2022-09-03 NOTE — H&P (Signed)
TOTAL KNEE ADMISSION H&P  Patient is being admitted for left total knee arthroplasty.  Subjective:  Chief Complaint:left knee pain.  HPI: Monica Larson, 76 y.o. female, has a history of pain and functional disability in the left knee due to arthritis and has failed non-surgical conservative treatments for greater than 12 weeks to includeNSAID's and/or analgesics and activity modification.  Onset of symptoms was gradual, starting 2 years ago with gradually worsening course since that time. The patient noted no past surgery on the left knee(s).  Patient currently rates pain in the left knee(s) at 8 out of 10 with activity. Patient has worsening of pain with activity and weight bearing, pain that interferes with activities of daily living, and pain with passive range of motion.  Patient has evidence of joint space narrowing by imaging studies. There is no active infection.  Patient Active Problem List   Diagnosis Date Noted   Preoperative examination 07/25/2022   Prediabetes 07/25/2022   Memory difficulty 12/10/2021   Plantar fasciitis of right foot 08/27/2021   Trochanteric bursitis of both hips 08/27/2021   Hypersomnia 10/11/2020   Vision changes 07/03/2020   Pain of fifth toe 02/21/2020   Balance problem 12/29/2019   GERD (gastroesophageal reflux disease) 10/25/2019   Feels cold 10/25/2019   H/O urethral stricture 09/18/2018   Skin lesion of breast 07/28/2018   Bilateral leg edema 07/28/2018   Neuropathy 12/23/2017   Bell's palsy 12/23/2017   Aortic atherosclerosis ([place]) 11/12/2017   Mixed hyperlipidemia 11/12/2017   Right knee pain 01/10/2017   Leg cramps 03/23/2015   Adjustment disorder with mixed anxiety and depressed mood 03/23/2015   Basal cell carcinoma 07/28/2014   Urge incontinence 10/25/2013   Obesity 07/20/2013   Insomnia 07/20/2013   Hypertension 05/14/2011   Dyspnea 10/04/2010   Past Medical History:  Diagnosis Date   Arthritis    HANDS/NECK/SHOULDERS   Asthma     SEASONAL   Cancer ([place])    skin   Diarrhea    INTERMITTENT CHRONIC   Fatigue    GERD (gastroesophageal reflux disease)    H/O motion sickness 06/17/2016   Headache    SINUS/ ALLERGIES   HLD (hyperlipidemia)    borderline   HTN (hypertension)    CONTROLLED ON MEDS   Motion sickness    Seasonal allergies    Shortness of breath dyspnea    ON EXERTION    Past Surgical History:  Procedure Laterality Date   ABDOMINAL HYSTERECTOMY     BREAST BIOPSY Bilateral    NEG   BREAST EXCISIONAL BIOPSY Bilateral    NEG   CARPAL TUNNEL RELEASE     right wrist   CHOLECYSTECTOMY     COLONOSCOPY N/A 01/31/2016   Procedure: COLONOSCOPY;  Surgeon: Hulen Luster, MD;  Location: [place];  Service: Gastroenterology;  Laterality: N/A;   EYE SURGERY     muscular as child/ CATARACT BILATERAL   SKIN CANCER EXCISION Right Wrist and thigh (x2)   Unsure of dates and Dermatologist   VESICOVAGINAL FISTULA CLOSURE W/ TAH      No current facility-administered medications for this encounter.   Current Outpatient Medications  Medication Sig Dispense Refill Last Dose   acetaminophen (TYLENOL) 500 MG tablet Take 1,000 mg by mouth every 6 (six) hours as needed for moderate pain.      albuterol (VENTOLIN HFA) 108 (90 Base) MCG/ACT inhaler INHALE 2 PUFFS INTO THE LUNGS EVERY 6 HOURS AS NEEDED FOR WHEEZING OR SHORTNESS OF BREATH 20.1  g 0    amLODipine (NORVASC) 5 MG tablet Take 1 tablet (5 mg total) by mouth daily. 90 tablet 3    azelastine (ASTELIN) 0.1 % nasal spray Place 1 spray into both nostrils daily as needed for rhinitis. Use in each nostril as directed      carvedilol (COREG) 6.25 MG tablet TAKE 1 TABLET(6.25 MG) BY MOUTH TWICE DAILY WITH A MEAL 180 tablet 1    diphenhydrAMINE (BENADRYL) 25 MG tablet Take 25 mg by mouth every 6 (six) hours as needed for allergies.      estradiol (ESTRACE) 0.1 MG/GM vaginal cream Estrogen Cream Instruction Discard applicator Apply pea sized amount to tip of  finger to urethra before bed. Wash hands well after application. Use Monday, Wednesday and Friday 42.5 g 12    Homeopathic Products (LEG CRAMPS) TABS Take 1 tablet by mouth daily as needed (leg cramps).      losartan (COZAAR) 100 MG tablet TAKE 1 TABLET(100 MG) BY MOUTH DAILY IN THE MORNING 90 tablet 1    magnesium hydroxide (MILK OF MAGNESIA) 400 MG/5ML suspension Take 15 mLs by mouth daily as needed for mild constipation.      mirabegron ER (MYRBETRIQ) 50 MG TB24 tablet Take 1 tablet (50 mg total) by mouth daily. 30 tablet 11    omeprazole (PRILOSEC) 20 MG capsule TAKE 1 CAPSULE BY MOUTH DAILY AS NEEDED FOR STOMACH ACID OR REFLUX 90 capsule 1    OVER THE COUNTER MEDICATION Apply 1 Application topically daily as needed (leg cramps). Relaxing leg cream      Probiotic Product (CULTURELLE PROBIOTICS PO) Take 1 capsule by mouth daily.      rosuvastatin (CRESTOR) 10 MG tablet TAKE 1 TABLET(10 MG) BY MOUTH DAILY 90 tablet 1    solifenacin (VESICARE) 10 MG tablet Take 10 mg by mouth daily.      traZODone (DESYREL) 150 MG tablet Take 1 tablet (150 mg total) by mouth at bedtime. (Patient taking differently: Take 150 mg by mouth at bedtime as needed for sleep.) 90 tablet 3    aspirin 81 MG EC tablet Take 81 mg by mouth daily.       DULoxetine (CYMBALTA) 30 MG capsule Take 1 capsule (30 mg total) by mouth daily. 90 capsule 1    Multiple Vitamins-Minerals (MULTIVITAMIN WITH MINERALS) tablet Take 1 tablet by mouth daily.       pregabalin (LYRICA) 150 MG capsule Take 1 capsule (150 mg total) by mouth 2 (two) times daily. 180 capsule 1    Allergies  Allergen Reactions   Lisinopril Cough   Shellfish Allergy Swelling    Lip swelling, tolerates shrimp    Sulfa Antibiotics Hives   Sulfonamide Derivatives Hives    Social History   Tobacco Use   Smoking status: Former    Passive exposure: Past   Smokeless tobacco: Never   Tobacco comments:    1 cig every 3 months   Substance Use Topics   Alcohol use:  Yes    Comment: 1 MIXED DRINK/WEEK    Family History  Problem Relation Age of Onset   Ovarian cancer Mother    Prostate cancer Father    Coronary artery disease Father    Diabetes Father    Heart attack Father    Heart attack Brother    Breast cancer Neg Hx      Review of Systems  Constitutional:  Negative for chills and fever.  Respiratory:  Negative for cough and shortness of breath.  Cardiovascular:  Negative for chest pain.  Gastrointestinal:  Negative for nausea and vomiting.  Musculoskeletal:  Positive for arthralgias.     Objective:  Physical Exam Well nourished and well developed. General: Alert and oriented x3, cooperative and pleasant, no acute distress. Head: normocephalic, atraumatic, neck supple. Eyes: EOMI.  Musculoskeletal: Bilateral knee exams: No palpable effusions, warmth erythema Right knee neutral to slight valgus whereas left knee is more neutral to slight varus Right knee tender over the lateral aspect the knee as well as anteriorly whereas the left knee is tender medial and anterior No significant flexion contractures Flexion over 110 degrees with terminal tightness and mild crepitation   Calves soft and nontender. Motor function intact in LE. Strength 5/5 LE bilaterally. Neuro: Distal pulses 2+. Sensation to light touch intact in LE.  Vital signs in last 24 hours:    Labs:   Estimated body mass index is 31.32 kg/m as calculated from the following:   Height as of 08/27/22: '5\' 7"'$  (1.702 m).   Weight as of 08/27/22: 90.7 kg.   Imaging Review Plain radiographs demonstrate severe degenerative joint disease of the left knee(s). The overall alignment isneutral. The bone quality appears to be adequate for age and reported activity level.      Assessment/Plan:  End stage arthritis, left knee   The patient history, physical examination, clinical judgment of the provider and imaging studies are consistent with end stage degenerative  joint disease of the left knee(s) and total knee arthroplasty is deemed medically necessary. The treatment options including medical management, injection therapy arthroscopy and arthroplasty were discussed at length. The risks and benefits of total knee arthroplasty were presented and reviewed. The risks due to aseptic loosening, infection, stiffness, patella tracking problems, thromboembolic complications and other imponderables were discussed. The patient acknowledged the explanation, agreed to proceed with the plan and consent was signed. Patient is being admitted for inpatient treatment for surgery, pain control, PT, OT, prophylactic antibiotics, VTE prophylaxis, progressive ambulation and ADL's and discharge planning. The patient is planning to be discharged  home.  Therapy Plans: outpatient therapy at [facility] in [place] Disposition: Home with husband, friends helping as well Planned DVT Prophylaxis: aspirin '81mg'$  BID DME needed: walker PCP: Dr. Caryl Bis, clearance received cardio: Gerrie Nordmann, NP - echo scheduled for 12/21 TXA: IV Allergies: sulfa drugs - angioedema Anesthesia Concerns: none BMI: 33.1 Last HgbA1c: Not diabetic   Other: - has dental partial that is loose - may need repaired after surgery - borrowing CTU from daughter - hydromorphone, robaxin, tylenol, meloxicam   Patient's anticipated LOS is less than 2 midnights, meeting these requirements: - Younger than 18 - Lives within 1 hour of care - Has a competent adult at home to recover with post-op recover - NO history of  - Chronic pain requiring opiods  - Diabetes  - Coronary Artery Disease  - Heart failure  - Heart attack  - Stroke  - DVT/VTE  - Cardiac arrhythmia  - Respiratory Failure/COPD  - Renal failure  - Anemia  - Advanced Liver disease  Costella Hatcher, PA-C Orthopedic Surgery [facility] [place] [phone]

## 2022-09-03 NOTE — Telephone Encounter (Signed)
LMOM to CB in regards to Dr. Biagio Quint wanting to confirm if the pt is still taking Lyrica as we have received a refill request and she has not filled the medication in several months.

## 2022-09-03 NOTE — Transfer of Care (Signed)
Immediate Anesthesia Transfer of Care Note  Patient: Monica Larson  Procedure(s) Performed: TOTAL KNEE ARTHROPLASTY (Left: Knee)  Patient Location: PACU  Anesthesia Type:Regional and Spinal  Level of Consciousness: awake, alert , and oriented  Airway & Oxygen Therapy: Patient Spontanous Breathing and Patient connected to face mask oxygen  Post-op Assessment: Report given to RN and Post -op Vital signs reviewed and stable  Post vital signs: Reviewed and stable  Last Vitals:  Vitals Value Taken Time  BP 124/59 09/03/22 1630  Temp    Pulse 63 09/03/22 1630  Resp 13 09/03/22 1630  SpO2 100 % 09/03/22 1630  Vitals shown include unvalidated device data.  Last Pain:  Vitals:   09/03/22 1430  TempSrc:   PainSc: 0-No pain         Complications: No notable events documented.

## 2022-09-03 NOTE — Anesthesia Postprocedure Evaluation (Signed)
Anesthesia Post Note  Patient: Monica Larson  Procedure(s) Performed: TOTAL KNEE ARTHROPLASTY (Left: Knee)     Patient location during evaluation: PACU Anesthesia Type: Spinal Level of consciousness: awake Pain management: pain level controlled Vital Signs Assessment: post-procedure vital signs reviewed and stable Respiratory status: spontaneous breathing Cardiovascular status: stable Postop Assessment: no headache, no backache, spinal receding, patient able to bend at knees and no apparent nausea or vomiting Anesthetic complications: no  No notable events documented.  Last Vitals:  Vitals:   09/03/22 1700 09/03/22 1715  BP: (!) 158/75 (!) 150/78  Pulse: 63 60  Resp: 15 20  Temp:    SpO2: 97% 98%    Last Pain:  Vitals:   09/03/22 1715  TempSrc:   PainSc: 0-No pain                 Huston Foley

## 2022-09-04 ENCOUNTER — Telehealth: Payer: Self-pay | Admitting: Family Medicine

## 2022-09-04 ENCOUNTER — Other Ambulatory Visit (HOSPITAL_COMMUNITY): Payer: Self-pay

## 2022-09-04 ENCOUNTER — Other Ambulatory Visit: Payer: Self-pay

## 2022-09-04 DIAGNOSIS — Z79899 Other long term (current) drug therapy: Secondary | ICD-10-CM | POA: Diagnosis not present

## 2022-09-04 DIAGNOSIS — J45909 Unspecified asthma, uncomplicated: Secondary | ICD-10-CM | POA: Diagnosis not present

## 2022-09-04 DIAGNOSIS — I10 Essential (primary) hypertension: Secondary | ICD-10-CM | POA: Diagnosis not present

## 2022-09-04 DIAGNOSIS — Z85828 Personal history of other malignant neoplasm of skin: Secondary | ICD-10-CM | POA: Diagnosis not present

## 2022-09-04 DIAGNOSIS — Z87891 Personal history of nicotine dependence: Secondary | ICD-10-CM | POA: Diagnosis not present

## 2022-09-04 DIAGNOSIS — M1712 Unilateral primary osteoarthritis, left knee: Secondary | ICD-10-CM | POA: Diagnosis not present

## 2022-09-04 LAB — BASIC METABOLIC PANEL
Anion gap: 8 (ref 5–15)
BUN: 13 mg/dL (ref 8–23)
CO2: 22 mmol/L (ref 22–32)
Calcium: 8.1 mg/dL — ABNORMAL LOW (ref 8.9–10.3)
Chloride: 107 mmol/L (ref 98–111)
Creatinine, Ser: 1.07 mg/dL — ABNORMAL HIGH (ref 0.44–1.00)
GFR, Estimated: 53 mL/min — ABNORMAL LOW (ref >60)
Glucose, Bld: 245 mg/dL — ABNORMAL HIGH (ref 70–99)
Potassium: 4.1 mmol/L (ref 3.5–5.1)
Sodium: 137 mmol/L (ref 135–145)

## 2022-09-04 LAB — CBC
HCT: 39.5 % (ref 36.0–46.0)
Hemoglobin: 12.8 g/dL (ref 12.0–15.0)
MCH: 32 pg (ref 26.0–34.0)
MCHC: 32.4 g/dL (ref 30.0–36.0)
MCV: 98.8 fL (ref 80.0–100.0)
Platelets: 176 10*3/uL (ref 150–400)
RBC: 4 MIL/uL (ref 3.87–5.11)
RDW: 12.5 % (ref 11.5–15.5)
WBC: 10.7 10*3/uL — ABNORMAL HIGH (ref 4.0–10.5)
nRBC: 0 % (ref 0.0–0.2)

## 2022-09-04 MED ORDER — METHOCARBAMOL 500 MG PO TABS: 500.0000 mg | ORAL_TABLET | Freq: Four times a day (QID) | ORAL | 2 refills | Status: DC | PRN

## 2022-09-04 MED ORDER — SENNA 8.6 MG PO TABS: 1.0000 | ORAL_TABLET | Freq: Every day | ORAL | 0 refills | Status: AC

## 2022-09-04 MED ORDER — HYDROMORPHONE HCL 2 MG PO TABS: 2.0000 mg | ORAL_TABLET | ORAL | 0 refills | Status: DC | PRN

## 2022-09-04 MED ORDER — ASPIRIN 81 MG PO CHEW: 81.0000 mg | CHEWABLE_TABLET | Freq: Two times a day (BID) | ORAL | 0 refills | Status: AC

## 2022-09-04 MED ORDER — POLYETHYLENE GLYCOL 3350 17 G PO PACK: 17.0000 g | PACK | Freq: Two times a day (BID) | ORAL | 0 refills | Status: DC

## 2022-09-04 MED ORDER — HYDROMORPHONE HCL 2 MG PO TABS
2.0000 mg | ORAL_TABLET | ORAL | 0 refills | Status: DC | PRN
  Filled 2022-09-04: qty 42, 7d supply, fill #0

## 2022-09-04 MED ORDER — MELOXICAM 15 MG PO TABS: 15.0000 mg | ORAL_TABLET | Freq: Every day | ORAL | 1 refills | Status: DC

## 2022-09-04 NOTE — Plan of Care (Signed)
Pt ready to DC home with husband. Has RW.

## 2022-09-04 NOTE — Progress Notes (Signed)
Physical Therapy Treatment Patient Details Name: Monica Larson MRN: [id number] DOB: [birth year] Today's Date: 09/04/2022   History of Present Illness Pt is a 76 year old female s/p L TKA on 09/03/22    PT Comments    POD # 1 pm session Pt was OOB in bathroom with RN, dressed and eager to go home.  Assisted with amb in hallway, practiced stairs, Then returned to room to perform some TE's following HEP handout.  Instructed on proper tech, freq as well as use of ICE.   Addressed all mobility questions, discussed appropriate activity, educated on use of ICE.  Pt ready for D/C to home.   Recommendations for follow up therapy are one component of a multi-disciplinary discharge planning process, led by the attending physician.  Recommendations may be updated based on patient status, additional functional criteria and insurance authorization.  Follow Up Recommendations  Follow physician's recommendations for discharge plan and follow up therapies     Assistance Recommended at Discharge Set up Supervision/Assistance  Patient can return home with the following Assist for transportation;A little help with walking and/or transfers;A little help with bathing/dressing/bathroom   Equipment Recommendations  Rolling walker (2 wheels)    Recommendations for Other Services       Precautions / Restrictions Precautions Precautions: Fall;Knee Precaution Comments: reviewed no pillow under knee Restrictions Weight Bearing Restrictions: No Other Position/Activity Restrictions: WBAT     Mobility  Bed Mobility Overal bed mobility: Needs Assistance Bed Mobility: Sit to Supine       Sit to supine: Supervision   General bed mobility comments: demonstarted and educated how to use a belt to self assist    Transfers Overall transfer level: Needs assistance Equipment used: Rolling walker (2 wheels) Transfers: Sit to/from Stand Sit to Stand: Supervision, Min guard           General transfer  comment: 25% VC's on proper hand placement as well as safety with turns    Ambulation/Gait Ambulation/Gait assistance: Supervision, Min guard Gait Distance (Feet): 45 Feet Assistive device: Rolling walker (2 wheels) Gait Pattern/deviations: Step-to pattern, Decreased stance time - left, Antalgic, Trunk flexed Gait velocity: decr     General Gait Details: 25% VC's on safety with turns and proper upright posture.   Stairs Stairs: Yes Stairs assistance: Supervision, Min guard Stair Management: One rail Right, Step to pattern, Forwards, Sideways Number of Stairs: 4 General stair comments: 50% VC's on proper sequencing as well as safety using B hands on ONE rail.   Wheelchair Mobility    Modified Rankin (Stroke Patients Only)       Balance                                            Cognition Arousal/Alertness: Awake/alert Behavior During Therapy: WFL for tasks assessed/performed Overall Cognitive Status: Within Functional Limits for tasks assessed                                 General Comments: AxO x 3 very pleasant Lady eager to go home        Exercises  Total Knee Replacement TE's following HEP handout 10 reps B LE ankle pumps 05 reps towel squeezes 05 reps knee presses 05 reps heel slides  05 reps SAQ's 05 reps SLR's 05 reps ABD Educated on  use of gait belt to assist with TE's Followed by ICE     General Comments        Pertinent Vitals/Pain Pain Assessment Pain Assessment: 0-10 Pain Score: 3  Pain Location: left knee Pain Descriptors / Indicators: Sore, Aching, Operative site guarding, Tender Pain Intervention(s): Monitored during session, Premedicated before session, Repositioned, Ice applied    Home Living                          Prior Function            PT Goals (current goals can now be found in the care plan section) Acute Rehab PT Goals PT Goal Formulation: With patient Time For Goal  Achievement: 09/18/22 Potential to Achieve Goals: Good Progress towards PT goals: Progressing toward goals    Frequency    7X/week      PT Plan Current plan remains appropriate    Co-evaluation              AM-PAC PT "6 Clicks" Mobility   Outcome Measure  Help needed turning from your back to your side while in a flat bed without using bedrails?: A Little Help needed moving from lying on your back to sitting on the side of a flat bed without using bedrails?: A Little Help needed moving to and from a bed to a chair (including a wheelchair)?: A Little Help needed standing up from a chair using your arms (e.g., wheelchair or bedside chair)?: A Little Help needed to walk in hospital room?: A Little Help needed climbing 3-5 steps with a railing? : A Little 6 Click Score: 18    End of Session Equipment Utilized During Treatment: Gait belt Activity Tolerance: Patient tolerated treatment well Patient left: in bed;with call bell/phone within reach Nurse Communication: Mobility status (pt ready for D/C to home) PT Visit Diagnosis: Difficulty in walking, not elsewhere classified (R26.2)     Time: 1335-1400 PT Time Calculation (min) (ACUTE ONLY): 25 min  Charges:  $Gait Training: 8-22 mins $Therapeutic Exercise: 8-22 mins                    Rica Koyanagi  PTA Acute  Rehabilitation Services Office M-F          [phone] Weekend pager [phone]

## 2022-09-04 NOTE — Progress Notes (Signed)
Subjective: 1 Day Post-Op Procedure(s) (LRB): TOTAL KNEE ARTHROPLASTY (Left) Patient reports pain as mild.   Patient seen in rounds with Dr. Alvan Dame. Patient is well, and has had no acute complaints or problems. No acute events overnight. Foley catheter removed. Patient has not been up with PT yet.  We will start therapy today.   Objective: Vital signs in last 24 hours: Temp:  [97.4 F (36.3 C)-98 F (36.7 C)] 97.8 F (36.6 C) (12/27 0556) Pulse Rate:  [59-75] 75 (12/27 0556) Resp:  [13-33] 17 (12/27 0556) BP: (120-187)/(59-116) 149/64 (12/27 0556) SpO2:  [93 %-100 %] 96 % (12/27 0556) Weight:  [90.7 kg] 90.7 kg (12/26 1815)  Intake/Output from previous day:  Intake/Output Summary (Last 24 hours) at 09/04/2022 0747 Last data filed at 09/04/2022 0600 Gross per 24 hour  Intake 2844.47 ml  Output 1125 ml  Net 1719.47 ml     Intake/Output this shift: No intake/output data recorded.  Labs: Recent Labs    09/04/22 0427  HGB 12.8   Recent Labs    09/04/22 0427  WBC 10.7*  RBC 4.00  HCT 39.5  PLT 176   Recent Labs    09/04/22 0427  NA 137  K 4.1  CL 107  CO2 22  BUN 13  CREATININE 1.07*  GLUCOSE 245*  CALCIUM 8.1*   No results for input(s): "LABPT", "INR" in the last 72 hours.  Exam: General - Patient is Alert and Oriented Extremity - Neurologically intact Sensation intact distally Intact pulses distally Dorsiflexion/Plantar flexion intact Dressing - dressing C/D/I Motor Function - intact, moving foot and toes well on exam.   Past Medical History:  Diagnosis Date   Arthritis    HANDS/NECK/SHOULDERS   Asthma    SEASONAL   Cancer ([place])    skin   Diarrhea    INTERMITTENT CHRONIC   Fatigue    GERD (gastroesophageal reflux disease)    H/O motion sickness 06/17/2016   Headache    SINUS/ ALLERGIES   HLD (hyperlipidemia)    borderline   HTN (hypertension)    CONTROLLED ON MEDS   Motion sickness    Seasonal allergies    Shortness of breath  dyspnea    ON EXERTION    Assessment/Plan: 1 Day Post-Op Procedure(s) (LRB): TOTAL KNEE ARTHROPLASTY (Left) Principal Problem:   S/P total knee arthroplasty, left  Estimated body mass index is 31.32 kg/m as calculated from the following:   Height as of this encounter: '5\' 7"'$  (1.702 m).   Weight as of this encounter: 90.7 kg. Advance diet Up with therapy D/C IV fluids   Patient's anticipated LOS is less than 2 midnights, meeting these requirements: - Younger than 45 - Lives within 1 hour of care - Has a competent adult at home to recover with post-op recover - NO history of  - Chronic pain requiring opiods  - Diabetes  - Coronary Artery Disease  - Heart failure  - Heart attack  - Stroke  - DVT/VTE  - Cardiac arrhythmia  - Respiratory Failure/COPD  - Renal failure  - Anemia  - Advanced Liver disease     DVT Prophylaxis - Aspirin Weight bearing as tolerated.  Hgb stable at 12.8 this AM.  Plan is to go Home after hospital stay. Plan for discharge today following 1-2 sessions of PT as long as they are meeting their goals. Patient is scheduled for OPPT. Follow up in the office in 2 weeks.   Griffith Citron, PA-C Orthopedic Surgery [phone]  09/04/2022, 7:47 AM

## 2022-09-04 NOTE — Plan of Care (Signed)
Plan of care reviewed and discussed with the patient. 

## 2022-09-04 NOTE — Telephone Encounter (Signed)
Patient is in the hospital and cannot be reached.  Andrena Margerum,cma

## 2022-09-04 NOTE — Evaluation (Signed)
Physical Therapy Evaluation Patient Details Name: Monica Larson MRN: [id number] DOB: 1946-01-11 Today's Date: 09/04/2022  History of Present Illness  Pt is a 76 year old female s/p L TKA on 09/03/22  Clinical Impression  Pt is s/p TKA resulting in the deficits listed below (see PT Problem List).  Pt will benefit from skilled PT to increase their independence and safety with mobility to allow discharge to the venue listed below.  Pt ambulated in hallway and performed LE exercises.  Pt will need to practice steps prior to d/c and anticipates d/c home later today.         Recommendations for follow up therapy are one component of a multi-disciplinary discharge planning process, led by the attending physician.  Recommendations may be updated based on patient status, additional functional criteria and insurance authorization.  Follow Up Recommendations Follow physician's recommendations for discharge plan and follow up therapies      Assistance Recommended at Discharge    Patient can return home with the following  Help with stairs or ramp for entrance    Equipment Recommendations Rolling walker (2 wheels)  Recommendations for Other Services       Functional Status Assessment Patient has had a recent decline in their functional status and demonstrates the ability to make significant improvements in function in a reasonable and predictable amount of time.     Precautions / Restrictions Precautions Precautions: Fall;Knee Restrictions Weight Bearing Restrictions: No Other Position/Activity Restrictions: WBAT      Mobility  Bed Mobility               General bed mobility comments: pt in recliner    Transfers Overall transfer level: Needs assistance Equipment used: Rolling walker (2 wheels) Transfers: Sit to/from Stand Sit to Stand: Min guard           General transfer comment: verbal cues for UE and LE positioning    Ambulation/Gait Ambulation/Gait assistance:  Min guard Gait Distance (Feet): 120 Feet Assistive device: Rolling walker (2 wheels) Gait Pattern/deviations: Step-to pattern, Decreased stance time - left, Antalgic, Trunk flexed Gait velocity: decr     General Gait Details: verbal cues for sequence, step length, RW Positioning, posture  Stairs            Wheelchair Mobility    Modified Rankin (Stroke Patients Only)       Balance                                             Pertinent Vitals/Pain Pain Assessment Pain Assessment: 0-10 Pain Score: 7  Pain Location: left knee Pain Descriptors / Indicators: Sore, Aching Pain Intervention(s): Monitored during session, Repositioned    Home Living Family/patient expects to be discharged to:: Private residence Living Arrangements: Spouse/significant other Available Help at Discharge: Family (daughter to assist) Type of Home: House Home Access: Stairs to enter Entrance Stairs-Rails: Right Entrance Stairs-Number of Steps: 3-4   Home Layout: One level Home Equipment: Conservation officer, nature (2 wheels)      Prior Function Prior Level of Function : Independent/Modified Independent                     Hand Dominance        Extremity/Trunk Assessment        Lower Extremity Assessment Lower Extremity Assessment: LLE deficits/detail LLE Deficits / Details: approx 5-95* AAROM  left knee, pt reports some numbness in lower leg still present - observed decreased DF, also reports hx of neuropathy       Communication   Communication: No difficulties  Cognition Arousal/Alertness: Awake/alert Behavior During Therapy: WFL for tasks assessed/performed Overall Cognitive Status: Within Functional Limits for tasks assessed                                          General Comments      Exercises Total Joint Exercises Ankle Circles/Pumps: AROM, Both, 10 reps Quad Sets: AROM, Left, 10 reps Heel Slides: AAROM, Left, 10 reps Hip  ABduction/ADduction: AAROM, Left, 10 reps Straight Leg Raises: AAROM, Left, 10 reps Knee Flexion: AAROM, Seated, 10 reps, Left   Assessment/Plan    PT Assessment Patient needs continued PT services  PT Problem List Decreased strength;Decreased mobility;Decreased activity tolerance;Decreased knowledge of use of DME;Decreased range of motion;Pain       PT Treatment Interventions Stair training;Gait training;DME instruction;Therapeutic exercise;Therapeutic activities;Functional mobility training;Patient/family education    PT Goals (Current goals can be found in the Care Plan section)  Acute Rehab PT Goals PT Goal Formulation: With patient Time For Goal Achievement: 09/18/22 Potential to Achieve Goals: Good    Frequency 7X/week     Co-evaluation               AM-PAC PT "6 Clicks" Mobility  Outcome Measure Help needed turning from your back to your side while in a flat bed without using bedrails?: A Little Help needed moving from lying on your back to sitting on the side of a flat bed without using bedrails?: A Little Help needed moving to and from a bed to a chair (including a wheelchair)?: A Little Help needed standing up from a chair using your arms (e.g., wheelchair or bedside chair)?: A Little Help needed to walk in hospital room?: A Little Help needed climbing 3-5 steps with a railing? : A Little 6 Click Score: 18    End of Session Equipment Utilized During Treatment: Gait belt Activity Tolerance: Patient tolerated treatment well Patient left: in bed;with call bell/phone within reach;with bed alarm set   PT Visit Diagnosis: Difficulty in walking, not elsewhere classified (R26.2)    Time: 4765-4650 PT Time Calculation (min) (ACUTE ONLY): 22 min   Charges:   PT Evaluation $PT Eval Low Complexity: 1 Low        Kati PT, DPT Physical Therapist Acute Rehabilitation Services Preferred contact method: Secure Chat Weekend Pager Only: [phone] Office:  [phone]   Myrtis Hopping [place] 09/04/2022, 11:58 AM

## 2022-09-04 NOTE — TOC Transition Note (Signed)
Transition of Care [facility]) - CM/SW Discharge Note   Patient Details  Name: Monica Larson MRN: [id number] Date of Birth: Jan 17, 1946  [facility] CM/SW Contact:  Lennart Pall, LCSW Phone Number: 09/04/2022, 11:29 AM   Clinical Narrative:     Met with pt and confirming she has received RW to room via [facility].  OPPT already arranged with [facility] ([place]).  No further TOC needs.  Final next level of care: OP Rehab Barriers to Discharge: No Barriers Identified   Patient Goals and CMS Choice      Discharge Placement                         Discharge Plan and Services Additional resources added to the After Visit Summary for                  DME Arranged: Walker rolling DME Agency: [facility]                  Social Determinants of Health (SDOH) Interventions SDOH Screenings   Food Insecurity: No Food Insecurity (09/03/2022)  Housing: Low Risk  (09/03/2022)  Transportation Needs: No Transportation Needs (11/06/2021)  Depression (PHQ2-9): Low Risk  (07/25/2022)  Financial Resource Strain: Low Risk  (11/06/2021)  Physical Activity: Insufficiently Active (11/06/2021)  Social Connections: Unknown (11/06/2021)  Stress: No Stress Concern Present (11/06/2021)  Tobacco Use: Medium Risk (09/03/2022)     Readmission Risk Interventions     No data to display

## 2022-09-06 ENCOUNTER — Encounter (HOSPITAL_COMMUNITY): Payer: Self-pay | Admitting: Orthopedic Surgery

## 2022-09-06 DIAGNOSIS — M25562 Pain in left knee: Secondary | ICD-10-CM | POA: Diagnosis not present

## 2022-09-06 DIAGNOSIS — R262 Difficulty in walking, not elsewhere classified: Secondary | ICD-10-CM | POA: Diagnosis not present

## 2022-09-06 DIAGNOSIS — Z96652 Presence of left artificial knee joint: Secondary | ICD-10-CM | POA: Diagnosis not present

## 2022-09-11 DIAGNOSIS — M25562 Pain in left knee: Secondary | ICD-10-CM | POA: Diagnosis not present

## 2022-09-11 DIAGNOSIS — Z96652 Presence of left artificial knee joint: Secondary | ICD-10-CM | POA: Diagnosis not present

## 2022-09-11 DIAGNOSIS — R262 Difficulty in walking, not elsewhere classified: Secondary | ICD-10-CM | POA: Diagnosis not present

## 2022-09-15 NOTE — Discharge Summary (Signed)
Patient ID: Monica Larson MRN: [id number] DOB/AGE: 04/14/1946 77 y.o.  Admit date: 09/03/2022 Discharge date: 09/04/2022  Admission Diagnoses:  Left knee osteoarthritis  Discharge Diagnoses:  Principal Problem:   S/P total knee arthroplasty, left   Past Medical History:  Diagnosis Date   Arthritis    HANDS/NECK/SHOULDERS   Asthma    SEASONAL   Cancer ([place])    skin   Diarrhea    INTERMITTENT CHRONIC   Fatigue    GERD (gastroesophageal reflux disease)    H/O motion sickness 06/17/2016   Headache    SINUS/ ALLERGIES   HLD (hyperlipidemia)    borderline   HTN (hypertension)    CONTROLLED ON MEDS   Motion sickness    Seasonal allergies    Shortness of breath dyspnea    ON EXERTION    Surgeries: Procedure(s): TOTAL KNEE ARTHROPLASTY on 09/03/2022   Consultants:   Discharged Condition: Improved  Hospital Course: Monica Larson is an 77 y.o. female who was admitted 09/03/2022 for operative treatment ofS/P total knee arthroplasty, left. Patient has severe unremitting pain that affects sleep, daily activities, and work/hobbies. After pre-op clearance the patient was taken to the operating room on 09/03/2022 and underwent  Procedure(s): TOTAL KNEE ARTHROPLASTY.    Patient was given perioperative antibiotics:  Anti-infectives (From admission, onward)    Start     Dose/Rate Route Frequency Ordered Stop   09/03/22 2100  ceFAZolin (ANCEF) IVPB 2g/100 mL premix        2 g 200 mL/hr over 30 Minutes Intravenous Every 6 hours 09/03/22 1816 09/04/22 0425   09/03/22 1145  ceFAZolin (ANCEF) IVPB 2g/100 mL premix        2 g 200 mL/hr over 30 Minutes Intravenous On call to O.R. 09/03/22 1137 09/03/22 1449        Patient was given sequential compression devices, early ambulation, and chemoprophylaxis to prevent DVT. Patient worked with PT and was meeting their goals regarding safe ambulation and transfers.  Patient benefited maximally from hospital stay and there were no  complications.    Recent vital signs: No data found.   Recent laboratory studies: No results for input(s): "WBC", "HGB", "HCT", "PLT", "NA", "K", "CL", "CO2", "BUN", "CREATININE", "GLUCOSE", "INR", "CALCIUM" in the last 72 hours.  Invalid input(s): "PT", "2"   Discharge Medications:   Allergies as of 09/04/2022       Reactions   Lisinopril Cough   Shellfish Allergy Swelling   Lip swelling, tolerates shrimp    Sulfa Antibiotics Hives   Sulfonamide Derivatives Hives        Medication List     STOP taking these medications    aspirin EC 81 MG tablet Replaced by: aspirin 81 MG chewable tablet       TAKE these medications    acetaminophen 500 MG tablet Commonly known as: TYLENOL Take 1,000 mg by mouth every 6 (six) hours as needed for moderate pain.   albuterol 108 (90 Base) MCG/ACT inhaler Commonly known as: VENTOLIN HFA INHALE 2 PUFFS INTO THE LUNGS EVERY 6 HOURS AS NEEDED FOR WHEEZING OR SHORTNESS OF BREATH   amLODipine 5 MG tablet Commonly known as: NORVASC Take 1 tablet (5 mg total) by mouth daily.   aspirin 81 MG chewable tablet Chew 1 tablet (81 mg total) by mouth 2 (two) times daily for 28 days. Replaces: aspirin EC 81 MG tablet   azelastine 0.1 % nasal spray Commonly known as: ASTELIN Place 1 spray into both nostrils daily as needed  for rhinitis. Use in each nostril as directed   carvedilol 6.25 MG tablet Commonly known as: COREG TAKE 1 TABLET(6.25 MG) BY MOUTH TWICE DAILY WITH A MEAL   CULTURELLE PROBIOTICS PO Take 1 capsule by mouth daily.   diphenhydrAMINE 25 MG tablet Commonly known as: BENADRYL Take 25 mg by mouth every 6 (six) hours as needed for allergies.   DULoxetine 30 MG capsule Commonly known as: CYMBALTA Take 1 capsule (30 mg total) by mouth daily.   estradiol 0.1 MG/GM vaginal cream Commonly known as: ESTRACE Estrogen Cream Instruction Discard applicator Apply pea sized amount to tip of finger to urethra before bed. Wash hands  well after application. Use Monday, Wednesday and Friday   HYDROmorphone 2 MG tablet Commonly known as: DILAUDID Take 1 tablet (2 mg total) by mouth every 4 (four) hours as needed for severe pain.   Leg Cramps Tabs Take 1 tablet by mouth daily as needed (leg cramps).   losartan 100 MG tablet Commonly known as: COZAAR TAKE 1 TABLET(100 MG) BY MOUTH DAILY IN THE MORNING   magnesium hydroxide 400 MG/5ML suspension Commonly known as: MILK OF MAGNESIA Take 15 mLs by mouth daily as needed for mild constipation.   meloxicam 15 MG tablet Commonly known as: MOBIC Take 1 tablet (15 mg total) by mouth daily.   methocarbamol 500 MG tablet Commonly known as: ROBAXIN Take 1 tablet (500 mg total) by mouth every 6 (six) hours as needed for muscle spasms.   mirabegron ER 50 MG Tb24 tablet Commonly known as: MYRBETRIQ Take 1 tablet (50 mg total) by mouth daily.   multivitamin with minerals tablet Take 1 tablet by mouth daily.   omeprazole 20 MG capsule Commonly known as: PRILOSEC TAKE 1 CAPSULE BY MOUTH DAILY AS NEEDED FOR STOMACH ACID OR REFLUX   OVER THE COUNTER MEDICATION Apply 1 Application topically daily as needed (leg cramps). Relaxing leg cream   polyethylene glycol 17 g packet Commonly known as: MIRALAX / GLYCOLAX Take 17 g by mouth 2 (two) times daily.   pregabalin 150 MG capsule Commonly known as: Lyrica Take 1 capsule (150 mg total) by mouth 2 (two) times daily.   rosuvastatin 10 MG tablet Commonly known as: CRESTOR TAKE 1 TABLET(10 MG) BY MOUTH DAILY   senna 8.6 MG Tabs tablet Commonly known as: SENOKOT Take 1 tablet (8.6 mg total) by mouth at bedtime for 14 days.   solifenacin 10 MG tablet Commonly known as: VESICARE Take 10 mg by mouth daily.   traZODone 150 MG tablet Commonly known as: DESYREL Take 1 tablet (150 mg total) by mouth at bedtime. What changed:  when to take this reasons to take this               Discharge Care Instructions  (From  admission, onward)           Start     Ordered   09/04/22 0000  Change dressing       Comments: Maintain surgical dressing until follow up in the clinic. If the edges start to pull up, may reinforce with tape. If the dressing is no longer working, may remove and cover with gauze and tape, but must keep the area dry and clean.  Call with any questions or concerns.   09/04/22 0752            Diagnostic Studies: ECHOCARDIOGRAM COMPLETE  Result Date: 08/29/2022    ECHOCARDIOGRAM REPORT   Patient Name:   Kaylen L Reynolds Date of  Exam: 08/29/2022 Medical Rec #:  [id number]   Height:       67.0 in Accession #:    [id number]  Weight:       200.0 lb Date of Birth:  09-13-1945   BSA:          2.022 m Patient Age:    15 years    BP:           148/78 mmHg Patient Gender: F           HR:           57 bpm. Exam Location:  [facility] Procedure: 2D Echo, Cardiac Doppler and Color Doppler Indications:    R06.9 DOE  History:        Patient has prior history of Echocardiogram examinations, most                 recent 07/15/2016. Signs/Symptoms:Dyspnea; Risk                 Factors:Hypertension, Dyslipidemia and Former Smoker.  Sonographer:    Wilkie Aye RVT RCS Referring Phys: [phone] SHERI HAMMOCK IMPRESSIONS  1. Left ventricular ejection fraction, by estimation, is 60 to 65%. The left ventricle has normal function. The left ventricle has no regional wall motion abnormalities. Left ventricular diastolic parameters are consistent with Grade I diastolic dysfunction (impaired relaxation).  2. Right ventricular systolic function is normal. The right ventricular size is normal. Tricuspid regurgitation signal is inadequate for assessing PA pressure.  3. The mitral valve is normal in structure. No evidence of mitral valve regurgitation. No evidence of mitral stenosis.  4. The aortic valve is tricuspid. Aortic valve regurgitation is not visualized. No aortic stenosis is present.  5. The inferior vena cava is normal in size with  greater than 50% respiratory variability, suggesting right atrial pressure of 3 mmHg. Comparison(s): 2017-EF 60-65%. FINDINGS  Left Ventricle: Left ventricular ejection fraction, by estimation, is 60 to 65%. The left ventricle has normal function. The left ventricle has no regional wall motion abnormalities. The left ventricular internal cavity size was normal in size. There is  no left ventricular hypertrophy. Left ventricular diastolic parameters are consistent with Grade I diastolic dysfunction (impaired relaxation). Right Ventricle: The right ventricular size is normal. No increase in right ventricular wall thickness. Right ventricular systolic function is normal. Tricuspid regurgitation signal is inadequate for assessing PA pressure. Left Atrium: Left atrial size was normal in size. Right Atrium: Right atrial size was normal in size. Pericardium: There is no evidence of pericardial effusion. Mitral Valve: The mitral valve is normal in structure. No evidence of mitral valve regurgitation. No evidence of mitral valve stenosis. Tricuspid Valve: The tricuspid valve is normal in structure. Tricuspid valve regurgitation is not demonstrated. No evidence of tricuspid stenosis. Aortic Valve: The aortic valve is tricuspid. Aortic valve regurgitation is not visualized. No aortic stenosis is present. Aortic valve mean gradient measures 4.0 mmHg. Aortic valve peak gradient measures 6.6 mmHg. Aortic valve area, by VTI measures 1.89 cm. Pulmonic Valve: The pulmonic valve was normal in structure. Pulmonic valve regurgitation is not visualized. No evidence of pulmonic stenosis. Aorta: The aortic root is normal in size and structure. Venous: The inferior vena cava is normal in size with greater than 50% respiratory variability, suggesting right atrial pressure of 3 mmHg. IAS/Shunts: No atrial level shunt detected by color flow Doppler.  LEFT VENTRICLE PLAX 2D LVIDd:         3.90 cm   Diastology LVIDs:  2.30 cm   LV e'  medial:    5.93 cm/s LV PW:         0.90 cm   LV E/e' medial:  14.1 LV IVS:        1.00 cm   LV e' lateral:   8.55 cm/s LVOT diam:     1.70 cm   LV E/e' lateral: 9.8 LV SV:         56 LV SV Index:   28 LVOT Area:     2.27 cm  RIGHT VENTRICLE             IVC RV Basal diam:  3.30 cm     IVC diam: 1.30 cm RV Mid diam:    2.80 cm RV S prime:     11.00 cm/s TAPSE (M-mode): 2.3 cm LEFT ATRIUM           Index        RIGHT ATRIUM           Index LA diam:      3.20 cm 1.58 cm/m   RA Area:     10.60 cm LA Vol (A2C): 58.9 ml 29.13 ml/m  RA Volume:   18.50 ml  9.15 ml/m  AORTIC VALVE                    PULMONIC VALVE AV Area (Vmax):    1.95 cm     PV Vmax:       0.94 m/s AV Area (Vmean):   1.83 cm     PV Peak grad:  3.5 mmHg AV Area (VTI):     1.89 cm AV Vmax:           128.00 cm/s AV Vmean:          88.600 cm/s AV VTI:            0.295 m AV Peak Grad:      6.6 mmHg AV Mean Grad:      4.0 mmHg LVOT Vmax:         110.00 cm/s LVOT Vmean:        71.600 cm/s LVOT VTI:          0.246 m LVOT/AV VTI ratio: 0.83  AORTA Ao Root diam: 3.10 cm Ao Asc diam:  3.10 cm Ao Arch diam: 3.1 cm MITRAL VALVE MV Area (PHT): 3.27 cm    SHUNTS MV Decel Time: 232 msec    Systemic VTI:  0.25 m MV E velocity: 83.70 cm/s  Systemic Diam: 1.70 cm MV A velocity: 77.10 cm/s MV E/A ratio:  1.09 Ida Rogue MD Electronically signed by Ida Rogue MD Signature Date/Time: 08/29/2022/1:19:17 PM    Final     Disposition: Discharge disposition: 01-Home or Self Care       Discharge Instructions     Call MD / Call 911   Complete by: As directed    If you experience chest pain or shortness of breath, CALL 911 and be transported to the hospital emergency room.  If you develope a fever above 101 F, pus (white drainage) or increased drainage or redness at the wound, or calf pain, call your surgeon's office.   Change dressing   Complete by: As directed    Maintain surgical dressing until follow up in the clinic. If the edges start to pull up,  may reinforce with tape. If the dressing is no longer working, may remove and cover with gauze and tape, but must  keep the area dry and clean.  Call with any questions or concerns.   Constipation Prevention   Complete by: As directed    Drink plenty of fluids.  Prune juice may be helpful.  You may use a stool softener, such as Colace (over the counter) 100 mg twice a day.  Use MiraLax (over the counter) for constipation as needed.   Diet - low sodium heart healthy   Complete by: As directed    Increase activity slowly as tolerated   Complete by: As directed    Weight bearing as tolerated with assist device (walker, cane, etc) as directed, use it as long as suggested by your surgeon or therapist, typically at least 4-6 weeks.   Post-operative opioid taper instructions:   Complete by: As directed    POST-OPERATIVE OPIOID TAPER INSTRUCTIONS: It is important to wean off of your opioid medication as soon as possible. If you do not need pain medication after your surgery it is ok to stop day one. Opioids include: Codeine, Hydrocodone(Norco, Vicodin), Oxycodone(Percocet, oxycontin) and hydromorphone amongst others.  Long term and even short term use of opiods can cause: Increased pain response Dependence Constipation Depression Respiratory depression And more.  Withdrawal symptoms can include Flu like symptoms Nausea, vomiting And more Techniques to manage these symptoms Hydrate well Eat regular healthy meals Stay active Use relaxation techniques(deep breathing, meditating, yoga) Do Not substitute Alcohol to help with tapering If you have been on opioids for less than two weeks and do not have pain than it is ok to stop all together.  Plan to wean off of opioids This plan should start within one week post op of your joint replacement. Maintain the same interval or time between taking each dose and first decrease the dose.  Cut the total daily intake of opioids by one tablet each  day Next start to increase the time between doses. The last dose that should be eliminated is the evening dose.      TED hose   Complete by: As directed    Use stockings (TED hose) for 2 weeks on both leg(s).  You may remove them at night for sleeping.        Follow-up Information     Paralee Cancel, MD. Schedule an appointment as soon as possible for a visit in 2 week(s).   Specialty: Orthopedic Surgery Contact information: [address] [place] [address] [phone]                  Signed: Irving Copas 09/15/2022, 7:47 AM

## 2022-09-20 DIAGNOSIS — R262 Difficulty in walking, not elsewhere classified: Secondary | ICD-10-CM | POA: Diagnosis not present

## 2022-09-20 DIAGNOSIS — Z96652 Presence of left artificial knee joint: Secondary | ICD-10-CM | POA: Diagnosis not present

## 2022-09-20 DIAGNOSIS — M25562 Pain in left knee: Secondary | ICD-10-CM | POA: Diagnosis not present

## 2022-09-24 DIAGNOSIS — Z96652 Presence of left artificial knee joint: Secondary | ICD-10-CM | POA: Diagnosis not present

## 2022-09-24 DIAGNOSIS — R262 Difficulty in walking, not elsewhere classified: Secondary | ICD-10-CM | POA: Diagnosis not present

## 2022-09-24 DIAGNOSIS — M25562 Pain in left knee: Secondary | ICD-10-CM | POA: Diagnosis not present

## 2022-09-27 DIAGNOSIS — Z96652 Presence of left artificial knee joint: Secondary | ICD-10-CM | POA: Diagnosis not present

## 2022-09-27 DIAGNOSIS — M25562 Pain in left knee: Secondary | ICD-10-CM | POA: Diagnosis not present

## 2022-09-27 DIAGNOSIS — R262 Difficulty in walking, not elsewhere classified: Secondary | ICD-10-CM | POA: Diagnosis not present

## 2022-09-30 DIAGNOSIS — Z96652 Presence of left artificial knee joint: Secondary | ICD-10-CM | POA: Diagnosis not present

## 2022-09-30 DIAGNOSIS — M25562 Pain in left knee: Secondary | ICD-10-CM | POA: Diagnosis not present

## 2022-09-30 DIAGNOSIS — R262 Difficulty in walking, not elsewhere classified: Secondary | ICD-10-CM | POA: Diagnosis not present

## 2022-10-03 ENCOUNTER — Other Ambulatory Visit (HOSPITAL_COMMUNITY): Payer: Self-pay

## 2022-10-03 ENCOUNTER — Other Ambulatory Visit (HOSPITAL_COMMUNITY): Payer: Self-pay | Admitting: Student

## 2022-10-04 DIAGNOSIS — M25562 Pain in left knee: Secondary | ICD-10-CM | POA: Diagnosis not present

## 2022-10-04 DIAGNOSIS — R262 Difficulty in walking, not elsewhere classified: Secondary | ICD-10-CM | POA: Diagnosis not present

## 2022-10-04 DIAGNOSIS — Z96652 Presence of left artificial knee joint: Secondary | ICD-10-CM | POA: Diagnosis not present

## 2022-10-07 DIAGNOSIS — Z96652 Presence of left artificial knee joint: Secondary | ICD-10-CM | POA: Diagnosis not present

## 2022-10-07 DIAGNOSIS — M25562 Pain in left knee: Secondary | ICD-10-CM | POA: Diagnosis not present

## 2022-10-07 DIAGNOSIS — R262 Difficulty in walking, not elsewhere classified: Secondary | ICD-10-CM | POA: Diagnosis not present

## 2022-10-08 ENCOUNTER — Other Ambulatory Visit (HOSPITAL_COMMUNITY): Payer: Self-pay

## 2022-10-08 MED ORDER — HYDROMORPHONE HCL 2 MG PO TABS
2.0000 mg | ORAL_TABLET | Freq: Four times a day (QID) | ORAL | 0 refills | Status: DC | PRN
  Filled 2022-10-08: qty 30, 8d supply, fill #0

## 2022-10-15 DIAGNOSIS — R262 Difficulty in walking, not elsewhere classified: Secondary | ICD-10-CM | POA: Diagnosis not present

## 2022-10-15 DIAGNOSIS — M25562 Pain in left knee: Secondary | ICD-10-CM | POA: Diagnosis not present

## 2022-10-15 DIAGNOSIS — Z96652 Presence of left artificial knee joint: Secondary | ICD-10-CM | POA: Diagnosis not present

## 2022-10-17 DIAGNOSIS — R262 Difficulty in walking, not elsewhere classified: Secondary | ICD-10-CM | POA: Diagnosis not present

## 2022-10-17 DIAGNOSIS — M25562 Pain in left knee: Secondary | ICD-10-CM | POA: Diagnosis not present

## 2022-10-17 DIAGNOSIS — Z96652 Presence of left artificial knee joint: Secondary | ICD-10-CM | POA: Diagnosis not present

## 2022-10-23 DIAGNOSIS — Z96652 Presence of left artificial knee joint: Secondary | ICD-10-CM | POA: Diagnosis not present

## 2022-10-31 DIAGNOSIS — L578 Other skin changes due to chronic exposure to nonionizing radiation: Secondary | ICD-10-CM | POA: Diagnosis not present

## 2022-10-31 DIAGNOSIS — D2262 Melanocytic nevi of left upper limb, including shoulder: Secondary | ICD-10-CM | POA: Diagnosis not present

## 2022-10-31 DIAGNOSIS — Z85828 Personal history of other malignant neoplasm of skin: Secondary | ICD-10-CM | POA: Diagnosis not present

## 2022-10-31 DIAGNOSIS — D225 Melanocytic nevi of trunk: Secondary | ICD-10-CM | POA: Diagnosis not present

## 2022-11-12 ENCOUNTER — Other Ambulatory Visit: Payer: Self-pay | Admitting: Family Medicine

## 2022-11-12 DIAGNOSIS — K219 Gastro-esophageal reflux disease without esophagitis: Secondary | ICD-10-CM

## 2022-11-12 DIAGNOSIS — E782 Mixed hyperlipidemia: Secondary | ICD-10-CM

## 2022-11-14 ENCOUNTER — Telehealth: Payer: Self-pay | Admitting: Family Medicine

## 2022-11-14 NOTE — Telephone Encounter (Signed)
[place] to schedule their annual wellness visit. Appointment made for 11/25/2022.  Thank you,  [place] Direct dial  [phone]

## 2022-11-25 ENCOUNTER — Telehealth: Payer: Self-pay | Admitting: Family Medicine

## 2022-11-25 NOTE — Telephone Encounter (Signed)
Copied from [place] [phone]. Topic: Medicare AWV >> Nov 25, 2022  8:28 AM Lollie Marrow wrote: Reason for CRM: I left a message on patient's voice mail to reschedule 11/25/22 AWV appointment. [facility] is out of office. If patient returns my call, please transfer patient to me at [phone].

## 2022-12-02 NOTE — Telephone Encounter (Signed)
Error

## 2022-12-03 DIAGNOSIS — H524 Presbyopia: Secondary | ICD-10-CM | POA: Diagnosis not present

## 2022-12-09 ENCOUNTER — Ambulatory Visit (INDEPENDENT_AMBULATORY_CARE_PROVIDER_SITE_OTHER): Payer: Medicare Other

## 2022-12-09 VITALS — Ht 67.0 in | Wt 200.0 lb

## 2022-12-09 DIAGNOSIS — Z1231 Encounter for screening mammogram for malignant neoplasm of breast: Secondary | ICD-10-CM | POA: Diagnosis not present

## 2022-12-09 DIAGNOSIS — Z0000 Encounter for general adult medical examination without abnormal findings: Secondary | ICD-10-CM | POA: Diagnosis not present

## 2022-12-09 NOTE — Progress Notes (Signed)
Subjective:   Monica Larson is a 77 y.o. female who presents for Medicare Annual (Subsequent) preventive examination.  Review of Systems    No ROS.  Medicare Wellness Virtual Visit.  Visual/audio telehealth visit, UTA vital signs.   See social history for additional risk factors.   Cardiac Risk Factors include: advanced age (>23men, >59 women);hypertension     Objective:    Today's Vitals   12/09/22 0935  Weight: 200 lb (90.7 kg)  Height: 5\' 7"  (1.702 m)   Body mass index is 31.32 kg/m.     12/09/2022    9:42 AM 09/03/2022    6:00 PM 08/27/2022    1:15 PM 11/06/2021    8:33 AM 11/03/2020    8:49 AM 10/26/2019   10:42 AM 10/21/2018    9:43 AM  Advanced Directives  Does Patient Have a Medical Advance Directive? Yes Yes Yes Yes Yes Yes Yes  Type of Paramedic of [place];Living will Living will [place];Living will [place];Living will [place];Living will [place]   Does patient want to make changes to medical advance directive? No - Patient declined No - Patient declined  No - Patient declined No - Patient declined No - Patient declined No - Patient declined  Copy of [place] in Chart? No - copy requested No - copy requested No - copy requested No - copy requested No - copy requested No - copy requested     Current Medications (verified) Outpatient Encounter Medications as of 12/09/2022  Medication Sig   acetaminophen (TYLENOL) 500 MG tablet Take 1,000 mg by mouth every 6 (six) hours as needed for moderate pain.   albuterol (VENTOLIN HFA) 108 (90 Base) MCG/ACT inhaler INHALE 2 PUFFS INTO THE LUNGS EVERY 6 HOURS AS NEEDED FOR WHEEZING OR SHORTNESS OF BREATH   amLODipine (NORVASC) 5 MG tablet Take 1 tablet (5 mg total) by mouth daily.   azelastine (ASTELIN) 0.1 % nasal spray Place 1 spray into both nostrils daily as needed for rhinitis. Use in each nostril as  directed   carvedilol (COREG) 6.25 MG tablet TAKE 1 TABLET(6.25 MG) BY MOUTH TWICE DAILY WITH A MEAL   diphenhydrAMINE (BENADRYL) 25 MG tablet Take 25 mg by mouth every 6 (six) hours as needed for allergies.   DULoxetine (CYMBALTA) 30 MG capsule Take 1 capsule (30 mg total) by mouth daily.   estradiol (ESTRACE) 0.1 MG/GM vaginal cream Estrogen Cream Instruction Discard applicator Apply pea sized amount to tip of finger to urethra before bed. Wash hands well after application. Use Monday, Wednesday and Friday   Homeopathic Products (LEG CRAMPS) TABS Take 1 tablet by mouth daily as needed (leg cramps).   HYDROmorphone (DILAUDID) 2 MG tablet Take 1 tablet (2 mg total) by mouth every 6 (six) hours as needed for severe pain.   losartan (COZAAR) 100 MG tablet TAKE 1 TABLET(100 MG) BY MOUTH DAILY IN THE MORNING   magnesium hydroxide (MILK OF MAGNESIA) 400 MG/5ML suspension Take 15 mLs by mouth daily as needed for mild constipation.   meloxicam (MOBIC) 15 MG tablet Take 1 tablet (15 mg total) by mouth daily.   methocarbamol (ROBAXIN) 500 MG tablet Take 1 tablet (500 mg total) by mouth every 6 (six) hours as needed for muscle spasms.   mirabegron ER (MYRBETRIQ) 50 MG TB24 tablet Take 1 tablet (50 mg total) by mouth daily.   Multiple Vitamins-Minerals (MULTIVITAMIN WITH MINERALS) tablet Take  1 tablet by mouth daily.    omeprazole (PRILOSEC) 20 MG capsule TAKE 1 CAPSULE BY MOUTH DAILY AS NEEDED FOR STOMACH ACID OR REFLUX   OVER THE COUNTER MEDICATION Apply 1 Application topically daily as needed (leg cramps). Relaxing leg cream   polyethylene glycol (MIRALAX / GLYCOLAX) 17 g packet Take 17 g by mouth 2 (two) times daily.   pregabalin (LYRICA) 150 MG capsule Take 1 capsule (150 mg total) by mouth 2 (two) times daily.   Probiotic Product (CULTURELLE PROBIOTICS PO) Take 1 capsule by mouth daily.   rosuvastatin (CRESTOR) 10 MG tablet TAKE 1 TABLET(10 MG) BY MOUTH DAILY   solifenacin (VESICARE) 10 MG tablet Take  10 mg by mouth daily.   traZODone (DESYREL) 150 MG tablet Take 1 tablet (150 mg total) by mouth at bedtime. (Patient taking differently: Take 150 mg by mouth at bedtime as needed for sleep.)   No facility-administered encounter medications on file as of 12/09/2022.    Allergies (verified) Lisinopril, Shellfish allergy, Sulfa antibiotics, and Sulfonamide derivatives   History: Past Medical History:  Diagnosis Date   Arthritis    HANDS/NECK/SHOULDERS   Asthma    SEASONAL   Cancer    skin   Diarrhea    INTERMITTENT CHRONIC   Fatigue    GERD (gastroesophageal reflux disease)    H/O motion sickness 06/17/2016   Headache    SINUS/ ALLERGIES   HLD (hyperlipidemia)    borderline   HTN (hypertension)    CONTROLLED ON MEDS   Motion sickness    Seasonal allergies    Shortness of breath dyspnea    ON EXERTION   Past Surgical History:  Procedure Laterality Date   ABDOMINAL HYSTERECTOMY     BREAST BIOPSY Bilateral    NEG   BREAST EXCISIONAL BIOPSY Bilateral    NEG   CARPAL TUNNEL RELEASE     right wrist   CHOLECYSTECTOMY     COLONOSCOPY N/A 01/31/2016   Procedure: COLONOSCOPY;  Surgeon: Hulen Luster, MD;  Location: [facility];  Service: Gastroenterology;  Laterality: N/A;   EYE SURGERY     muscular as child/ CATARACT BILATERAL   SKIN CANCER EXCISION Right Wrist and thigh (x2)   Unsure of dates and Dermatologist   TOTAL KNEE ARTHROPLASTY Left 09/03/2022   Procedure: TOTAL KNEE ARTHROPLASTY;  Surgeon: Paralee Cancel, MD;  Location: [facility];  Service: Orthopedics;  Laterality: Left;   VESICOVAGINAL FISTULA CLOSURE W/ TAH     Family History  Problem Relation Age of Onset   Ovarian cancer Mother    Prostate cancer Father    Coronary artery disease Father    Diabetes Father    Heart attack Father    Heart attack Brother    Breast cancer Neg Hx    Social History   Socioeconomic History   Marital status: Married    Spouse name: Not on file   Number of children: Not on  file   Years of education: Not on file   Highest education level: Not on file  Occupational History   Not on file  Tobacco Use   Smoking status: Former    Passive exposure: Past   Smokeless tobacco: Never   Tobacco comments:    1 cig every 3 months   Vaping Use   Vaping Use: Never used  Substance and Sexual Activity   Alcohol use: Yes    Comment: 1 MIXED DRINK/WEEK   Drug use: No   Sexual activity: Not on file  Other Topics Concern   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    Social Determinants of Health   Financial Resource Strain: Low Risk  (12/08/2022)   Overall Financial Resource Strain (CARDIA)    Difficulty of Paying Living Expenses: Not hard at all  Food Insecurity: No Food Insecurity (12/08/2022)   Hunger Vital Sign    Worried About Running Out of Food in the Last Year: Never true    Ran Out of Food in the Last Year: Never true  Transportation Needs: No Transportation Needs (12/08/2022)   PRAPARE - Hydrologist (Medical): No    Lack of Transportation (Non-Medical): No  Physical Activity: Insufficiently Active (12/08/2022)   Exercise Vital Sign    Days of Exercise per Week: 3 days    Minutes of Exercise per Session: 10 min  Stress: No Stress Concern Present (12/08/2022)   [place]    Feeling of Stress : Not at all  Social Connections: Unknown (12/08/2022)   Social Connection and Isolation Panel [NHANES]    Frequency of Communication with Friends and Family: More than three times a week    Frequency of Social Gatherings with Friends and Family: Once a week    Attends Religious Services: Not on Advertising copywriter or Organizations: Yes    Attends Music therapist: More than 4 times per year    Marital Status: Married    Tobacco Counseling Counseling given: Not Answered Tobacco comments: 1 cig  every 3 months    Clinical Intake:  Pre-visit preparation completed: Yes        Diabetes: No  How often do you need to have someone help you when you read instructions, pamphlets, or other written materials from your doctor or pharmacy?: 2 - Rarely    Interpreter Needed?: No      Activities of Daily Living    12/08/2022   10:56 AM 09/03/2022    6:00 PM  In your present state of health, do you have any difficulty performing the following activities:  Hearing? 0 0  Vision? 0 1  Difficulty concentrating or making decisions? 1 1  Comment Dx Memory Difficulty   Walking or climbing stairs? 1 1  Dressing or bathing? 0 0  Doing errands, shopping? 0 0  Preparing Food and eating ? N   Using the Toilet? N   In the past six months, have you accidently leaked urine? Y   Comment Taking medication as directed   Do you have problems with loss of bowel control? N   Managing your Medications? N   Managing your Finances? N   Housekeeping or managing your Housekeeping? N     Patient Care Team: Leone Haven, MD as PCP - General (Family Medicine)  Indicate any recent Medical Services you may have received from other than [facility] providers in the past year (date may be approximate).     Assessment:   This is a routine wellness examination for [place].  Patient Medicare AWV questionnaire was completed by the patient on 12/08/22, I have confirmed that all information answered by patient is correct and no changes since this date.   I connected with  [name] on 12/09/22 by a audio enabled telemedicine application and verified that I am speaking with the correct person using two identifiers.  Patient Location: Home  Provider Location:  Office/Clinic  I discussed the limitations of evaluation and management by telemedicine. The patient expressed understanding and agreed to proceed.   Hearing/Vision screen Hearing Screening - Comments:: Patient is able to hear conversational tones  without difficulty. No issues reported. Vision Screening - Comments:: Followed by Dr. Matilde Sprang Wears corrective lenses Cataracts, extracted, bilateral   Dietary issues and exercise activities discussed: Current Exercise Habits: Home exercise routine, Intensity: Mild   Goals Addressed               This Visit's Progress     Patient Stated     I would like to lose weight (pt-stated)        Stay active; join the gym Healthy diet       Depression Screen    12/09/2022    9:41 AM 07/25/2022    3:29 PM 11/06/2021    8:38 AM 07/30/2021   10:27 AM 11/03/2020    8:41 AM 10/10/2020   10:09 AM 07/03/2020   10:16 AM  PHQ 2/9 Scores  PHQ - 2 Score 0 0 0 0 0 0 0    Fall Risk    12/08/2022   10:56 AM 07/25/2022    3:29 PM 11/06/2021    8:33 AM 07/30/2021   10:27 AM 11/03/2020    8:50 AM  [place] in the past year? 0 0 0 0 1  Number falls in past yr: 0 0  0 0  Injury with Fall? 0 0  0 0  Risk for fall due to :  No Fall Risks  No Fall Risks   Follow up Falls evaluation completed;Falls prevention discussed Falls evaluation completed Falls evaluation completed Falls evaluation completed Falls evaluation completed    FALL RISK PREVENTION PERTAINING TO THE HOME: Home free of loose throw rugs in walkways, pet beds, electrical cords, etc? Yes  Adequate lighting in your home to reduce risk of falls? Yes   ASSISTIVE DEVICES UTILIZED TO PREVENT FALLS: Life alert? No  Use of a cane, walker or w/c? No  Grab bars in the bathroom? Yes  Shower chair or bench in shower? Yes  Elevated toilet seat or a handicapped toilet? Yes   TIMED UP AND GO: Was the test performed? No .   Cognitive Function:    12/10/2021   10:32 AM 10/26/2019   10:48 AM  MMSE - Mini Mental State Exam  Not completed:  Refused  Orientation to time 4   Orientation to Place 5   Registration 3   Attention/ Calculation 4   Recall 2   Language- name 2 objects 2   Language- repeat 1   Language- follow 3 step  command 3   Language- read & follow direction 1   Write a sentence 1   Copy design 1   Total score 27         12/09/2022    9:45 AM 11/06/2021    9:05 AM 11/03/2020    8:53 AM 10/21/2018    9:44 AM 10/17/2016    4:41 PM  6CIT Screen  What Year? 0 points 0 points 0 points 0 points 0 points  What month? 0 points 0 points 0 points 0 points 0 points  What time? 0 points 0 points 0 points 0 points 0 points  Count back from 20 2 points 0 points  0 points 0 points  Months in reverse 4 points  2 points 4 points 0 points  Repeat phrase 0 points 10 points  0 points 0 points  Total Score 6 points   4 points 0 points    Immunizations Immunization History  Administered Date(s) Administered   Fluad Quad(high Dose 65+) 06/15/2019, 07/10/2022   Influenza Split 06/03/2011, 07/11/2022   Influenza, High Dose Seasonal PF 06/11/2016, 04/30/2017, 06/18/2018   Influenza,inj,quad, With Preservative 06/15/2019   Influenza-Unspecified 06/19/2013, 07/06/2014, 06/11/2020, 07/23/2021   PFIZER(Purple Top)SARS-COV-2 Vaccination 09/16/2019, 10/09/2019, 05/05/2020, 07/11/2022   Pfizer Covid-19 Vaccine Bivalent Booster 5yrs & up 07/23/2021   Pneumococcal Conjugate-13 11/07/2014   Pneumococcal Polysaccharide-23 07/20/2012   Rotavirus,unspecified  07/11/2022   Tdap 11/09/2020   Zoster Recombinat (Shingrix) 02/27/2018, 06/18/2018   Zoster, Live 04/09/2013   Screening Tests Health Maintenance  Topic Date Due   COVID-19 Vaccine (6 - 2023-24 season) 12/25/2022 (Originally 09/05/2022)   INFLUENZA VACCINE  04/10/2023   Medicare Annual Wellness (AWV)  12/09/2023   DTaP/Tdap/Td (2 - Td or Tdap) 11/10/2030   Pneumonia Vaccine 27+ Years old  Completed   DEXA SCAN  Completed   Hepatitis C Screening  Completed   Zoster Vaccines- Shingrix  Completed   HPV VACCINES  Aged Out   COLONOSCOPY (Pts 45-66yrs Insurance coverage will need to be confirmed)  Discontinued   Health Maintenance There are no preventive care  reminders to display for this patient.  Mammogram- ordered per consent. Schedule with [facility] [phone].   Lung Cancer Screening: (Low Dose CT Chest recommended if Age 80-80 years, 30 pack-year currently smoking OR have quit w/in 15years.) does not qualify.   Hepatitis C Screening: Completed 01/08/2106.   Vision Screening: Recommended annual ophthalmology exams for early detection of glaucoma and other disorders of the eye.  Dental Screening: Recommended annual dental exams for proper oral hygiene  Community Resource Referral / Chronic Care Management: CRR required this visit?  No   CCM required this visit?  No      Plan:     I have personally reviewed and noted the following in the patient's chart:   Medical and social history Use of alcohol, tobacco or illicit drugs  Current medications and supplements including opioid prescriptions. Patient is currently taking opioid prescriptions. Information provided to patient regarding non-opioid alternatives. Patient advised to discuss non-opioid treatment plan with their provider. Taking Dilaudid. Followed by surgeon from recent L knee Surgery.  Functional ability and status Nutritional status Physical activity Advanced directives List of other physicians Hospitalizations, surgeries, and ER visits in previous 12 months Vitals Screenings to include cognitive, depression, and falls Referrals and appointments  In addition, I have reviewed and discussed with patient certain preventive protocols, quality metrics, and best practice recommendations. A written personalized care plan for preventive services as well as general preventive health recommendations were provided to patient.     Leta Jungling, LPN   [id number]

## 2022-12-09 NOTE — Patient Instructions (Addendum)
Monica Larson , Thank you for taking time to come for your Medicare Wellness Visit. I appreciate your ongoing commitment to your health goals. Please review the following plan we discussed and let me know if I can assist you in the future.   These are the goals we discussed:  Goals       Patient Stated     I would like to lose weight (pt-stated)      Stay active; join the gym Healthy diet        This is a list of the screening recommended for you and due dates:  Health Maintenance  Topic Date Due   COVID-19 Vaccine (6 - 2023-24 season) 12/25/2022*   Flu Shot  04/10/2023   Medicare Annual Wellness Visit  12/09/2023   DTaP/Tdap/Td vaccine (2 - Td or Tdap) 11/10/2030   Pneumonia Vaccine  Completed   DEXA scan (bone density measurement)  Completed   Hepatitis C Screening: USPSTF Recommendation to screen - Ages 44-79 yo.  Completed   Zoster (Shingles) Vaccine  Completed   HPV Vaccine  Aged Out   Colon Cancer Screening  Discontinued  *Topic was postponed. The date shown is not the original due date.    Advanced directives: End of life planning; Advance aging; Advanced directives discussed.  Copy of current HCPOA/Living Will requested.    Conditions/risks identified: schedule mammogram; number provided [phone]. Schedule routine follow up with pcp.   Next appointment: Follow up in one year for your annual wellness visit    Preventive Care 65 Years and Older, Female Preventive care refers to lifestyle choices and visits with your health care provider that can promote health and wellness. What does preventive care include? A yearly physical exam. This is also called an annual well check. Dental exams once or twice a year. Routine eye exams. Ask your health care provider how often you should have your eyes checked. Personal lifestyle choices, including: Daily care of your teeth and gums. Regular physical activity. Eating a healthy diet. Avoiding tobacco and drug use. Limiting  alcohol use. Practicing safe sex. Taking low-dose aspirin every day. Taking vitamin and mineral supplements as recommended by your health care provider. What happens during an annual well check? The services and screenings done by your health care provider during your annual well check will depend on your age, overall health, lifestyle risk factors, and family history of disease. Counseling  Your health care provider may ask you questions about your: Alcohol use. Tobacco use. Drug use. Emotional well-being. Home and relationship well-being. Sexual activity. Eating habits. History of falls. Memory and ability to understand (cognition). Work and work Statistician. Reproductive health. Screening  You may have the following tests or measurements: Height, weight, and BMI. Blood pressure. Lipid and cholesterol levels. These may be checked every 5 years, or more frequently if you are over 17 years old. Skin check. Lung cancer screening. You may have this screening every year starting at age 59 if you have a 30-pack-year history of smoking and currently smoke or have quit within the past 15 years. Fecal occult blood test (FOBT) of the stool. You may have this test every year starting at age 37. Flexible sigmoidoscopy or colonoscopy. You may have a sigmoidoscopy every 5 years or a colonoscopy every 10 years starting at age 75. Hepatitis C blood test. Hepatitis B blood test. Sexually transmitted disease (STD) testing. Diabetes screening. This is done by checking your blood sugar (glucose) after you have not eaten for a while (  fasting). You may have this done every 1-3 years. Bone density scan. This is done to screen for osteoporosis. You may have this done starting at age 47. Mammogram. This may be done every 1-2 years. Talk to your health care provider about how often you should have regular mammograms. Talk with your health care provider about your test results, treatment options, and if  necessary, the need for more tests. Vaccines  Your health care provider may recommend certain vaccines, such as: Influenza vaccine. This is recommended every year. Tetanus, diphtheria, and acellular pertussis (Tdap, Td) vaccine. You may need a Td booster every 10 years. Zoster vaccine. You may need this after age 88. Pneumococcal 13-valent conjugate (PCV13) vaccine. One dose is recommended after age 41. Pneumococcal polysaccharide (PPSV23) vaccine. One dose is recommended after age 12. Talk to your health care provider about which screenings and vaccines you need and how often you need them. This information is not intended to replace advice given to you by your health care provider. Make sure you discuss any questions you have with your health care provider. Document Released: 09/22/2015 Document Revised: 05/15/2016 Document Reviewed: 06/27/2015 Elsevier Interactive Patient Education  2017 [place] Prevention in the Home Falls can cause injuries. They can happen to people of all ages. There are many things you can do to make your home safe and to help prevent falls. What can I do on the outside of my home? Regularly fix the edges of walkways and driveways and fix any cracks. Remove anything that might make you trip as you walk through a door, such as a raised step or threshold. Trim any bushes or trees on the path to your home. Use bright outdoor lighting. Clear any walking paths of anything that might make someone trip, such as rocks or tools. Regularly check to see if handrails are loose or broken. Make sure that both sides of any steps have handrails. Any raised decks and porches should have guardrails on the edges. Have any leaves, snow, or ice cleared regularly. Use sand or salt on walking paths during winter. Clean up any spills in your garage right away. This includes oil or grease spills. What can I do in the bathroom? Use night lights. Install grab bars by the toilet  and in the tub and shower. Do not use towel bars as grab bars. Use non-skid mats or decals in the tub or shower. If you need to sit down in the shower, use a plastic, non-slip stool. Keep the floor dry. Clean up any water that spills on the floor as soon as it happens. Remove soap buildup in the tub or shower regularly. Attach bath mats securely with double-sided non-slip rug tape. Do not have throw rugs and other things on the floor that can make you trip. What can I do in the bedroom? Use night lights. Make sure that you have a light by your bed that is easy to reach. Do not use any sheets or blankets that are too big for your bed. They should not hang down onto the floor. Have a firm chair that has side arms. You can use this for support while you get dressed. Do not have throw rugs and other things on the floor that can make you trip. What can I do in the kitchen? Clean up any spills right away. Avoid walking on wet floors. Keep items that you use a lot in easy-to-reach places. If you need to reach something above you, use  a strong step stool that has a grab bar. Keep electrical cords out of the way. Do not use floor polish or wax that makes floors slippery. If you must use wax, use non-skid floor wax. Do not have throw rugs and other things on the floor that can make you trip. What can I do with my stairs? Do not leave any items on the stairs. Make sure that there are handrails on both sides of the stairs and use them. Fix handrails that are broken or loose. Make sure that handrails are as long as the stairways. Check any carpeting to make sure that it is firmly attached to the stairs. Fix any carpet that is loose or worn. Avoid having throw rugs at the top or bottom of the stairs. If you do have throw rugs, attach them to the floor with carpet tape. Make sure that you have a light switch at the top of the stairs and the bottom of the stairs. If you do not have them, ask someone to add  them for you. What else can I do to help prevent falls? Wear shoes that: Do not have high heels. Have rubber bottoms. Are comfortable and fit you well. Are closed at the toe. Do not wear sandals. If you use a stepladder: Make sure that it is fully opened. Do not climb a closed stepladder. Make sure that both sides of the stepladder are locked into place. Ask someone to hold it for you, if possible. Clearly mark and make sure that you can see: Any grab bars or handrails. First and last steps. Where the edge of each step is. Use tools that help you move around (mobility aids) if they are needed. These include: Canes. Walkers. Scooters. Crutches. Turn on the lights when you go into a dark area. Replace any light bulbs as soon as they burn out. Set up your furniture so you have a clear path. Avoid moving your furniture around. If any of your floors are uneven, fix them. If there are any pets around you, be aware of where they are. Review your medicines with your doctor. Some medicines can make you feel dizzy. This can increase your chance of falling. Ask your doctor what other things that you can do to help prevent falls. This information is not intended to replace advice given to you by your health care provider. Make sure you discuss any questions you have with your health care provider. Document Released: 06/22/2009 Document Revised: 02/01/2016 Document Reviewed: 09/30/2014 Elsevier Interactive Patient Education  2017 [employer].

## 2022-12-10 ENCOUNTER — Encounter: Payer: Self-pay | Admitting: Family Medicine

## 2022-12-10 ENCOUNTER — Ambulatory Visit (INDEPENDENT_AMBULATORY_CARE_PROVIDER_SITE_OTHER): Payer: Medicare Other | Admitting: Family Medicine

## 2022-12-10 VITALS — BP 122/76 | HR 77 | Temp 98.4°F | Ht 67.0 in | Wt 203.4 lb

## 2022-12-10 DIAGNOSIS — I8391 Asymptomatic varicose veins of right lower extremity: Secondary | ICD-10-CM | POA: Diagnosis not present

## 2022-12-10 DIAGNOSIS — I83813 Varicose veins of bilateral lower extremities with pain: Secondary | ICD-10-CM | POA: Insufficient documentation

## 2022-12-10 DIAGNOSIS — N3941 Urge incontinence: Secondary | ICD-10-CM

## 2022-12-10 DIAGNOSIS — G629 Polyneuropathy, unspecified: Secondary | ICD-10-CM

## 2022-12-10 DIAGNOSIS — I10 Essential (primary) hypertension: Secondary | ICD-10-CM

## 2022-12-10 LAB — BASIC METABOLIC PANEL
BUN: 11 mg/dL (ref 6–23)
CO2: 29 mEq/L (ref 19–32)
Calcium: 9.3 mg/dL (ref 8.4–10.5)
Chloride: 105 mEq/L (ref 96–112)
Creatinine, Ser: 1 mg/dL (ref 0.40–1.20)
GFR: 54.26 mL/min — ABNORMAL LOW (ref >60.00)
Glucose, Bld: 111 mg/dL — ABNORMAL HIGH (ref 70–99)
Potassium: 4.2 mEq/L (ref 3.5–5.1)
Sodium: 140 mEq/L (ref 135–145)

## 2022-12-10 MED ORDER — AMLODIPINE BESYLATE 5 MG PO TABS: 5.0000 mg | ORAL_TABLET | Freq: Every day | ORAL | 3 refills | Status: DC

## 2022-12-10 MED ORDER — CARVEDILOL 6.25 MG PO TABS: ORAL_TABLET | ORAL | 1 refills | Status: DC

## 2022-12-10 NOTE — Assessment & Plan Note (Signed)
Chronic issue.  Advised that she only take Myrbetriq 50 mg daily.  She will discontinue the Vesicare.

## 2022-12-10 NOTE — Progress Notes (Signed)
[name], MD Phone: [phone]  Monica Larson is a 77 y.o. female who presents today for f/u.  HYPERTENSION Disease Monitoring Home BP Monitoring not checking Chest pain- no    Dyspnea- no Medications Compliance-  taking amlodipine, coreg, losartan.   Edema- no BMET    Component Value Date/Time   NA 137 09/04/2022 0427   NA 142 04/06/2021 1416   K 4.1 09/04/2022 0427   CL 107 09/04/2022 0427   CO2 22 09/04/2022 0427   GLUCOSE 245 (H) 09/04/2022 0427   BUN 13 09/04/2022 0427   BUN 11 04/06/2021 1416   CREATININE 1.07 (H) 09/04/2022 0427   CALCIUM 8.1 (L) 09/04/2022 0427   GFRNONAA 53 (L) 09/04/2022 0427   GFRAA >60 12/10/2017 1750   Varicose veins: Patient has varicose veins that have come up on her right lower leg.  She notes a little discomfort in her leg at the end of the day though no significant pain.  She notes no swelling.  She does report a varicose vein surgery when she was much younger.  Urinary incontinence: Patient notes she had been taking Myrbetriq and Vesicare.  She notes she is doing quite well on both of them.  Per her last urology note they advised her to stop the Vesicare and to remain only on the Myrbetriq.  Neuropathy: Patient continues on Lyrica.  She does note she gets a little wobbly on this and it does not really matter on what the doses.  It just seems to matter on the day.  Social History   Tobacco Use  Smoking Status Former   Passive exposure: Past  Smokeless Tobacco Never  Tobacco Comments   1 cig every 3 months     Current Outpatient Medications on File Prior to Visit  Medication Sig Dispense Refill   acetaminophen (TYLENOL) 500 MG tablet Take 1,000 mg by mouth every 6 (six) hours as needed for moderate pain.     albuterol (VENTOLIN HFA) 108 (90 Base) MCG/ACT inhaler INHALE 2 PUFFS INTO THE LUNGS EVERY 6 HOURS AS NEEDED FOR WHEEZING OR SHORTNESS OF BREATH 20.1 g 0   azelastine (ASTELIN) 0.1 % nasal spray Place 1 spray into both  nostrils daily as needed for rhinitis. Use in each nostril as directed     diphenhydrAMINE (BENADRYL) 25 MG tablet Take 25 mg by mouth every 6 (six) hours as needed for allergies.     DULoxetine (CYMBALTA) 30 MG capsule Take 1 capsule (30 mg total) by mouth daily. 90 capsule 1   estradiol (ESTRACE) 0.1 MG/GM vaginal cream Estrogen Cream Instruction Discard applicator Apply pea sized amount to tip of finger to urethra before bed. Wash hands well after application. Use Monday, Wednesday and Friday 42.5 g 12   Homeopathic Products (LEG CRAMPS) TABS Take 1 tablet by mouth daily as needed (leg cramps).     HYDROmorphone (DILAUDID) 2 MG tablet Take 1 tablet (2 mg total) by mouth every 6 (six) hours as needed for severe pain. 30 tablet 0   losartan (COZAAR) 100 MG tablet TAKE 1 TABLET(100 MG) BY MOUTH DAILY IN THE MORNING 90 tablet 1   magnesium hydroxide (MILK OF MAGNESIA) 400 MG/5ML suspension Take 15 mLs by mouth daily as needed for mild constipation.     meloxicam (MOBIC) 15 MG tablet Take 1 tablet (15 mg total) by mouth daily. 30 tablet 1   methocarbamol (ROBAXIN) 500 MG tablet Take 1 tablet (500 mg total) by mouth every 6 (six) hours as needed  for muscle spasms. 40 tablet 2   mirabegron ER (MYRBETRIQ) 50 MG TB24 tablet Take 1 tablet (50 mg total) by mouth daily. 30 tablet 11   Multiple Vitamins-Minerals (MULTIVITAMIN WITH MINERALS) tablet Take 1 tablet by mouth daily.      omeprazole (PRILOSEC) 20 MG capsule TAKE 1 CAPSULE BY MOUTH DAILY AS NEEDED FOR STOMACH ACID OR REFLUX 90 capsule 1   OVER THE COUNTER MEDICATION Apply 1 Application topically daily as needed (leg cramps). Relaxing leg cream     polyethylene glycol (MIRALAX / GLYCOLAX) 17 g packet Take 17 g by mouth 2 (two) times daily. 14 each 0   pregabalin (LYRICA) 150 MG capsule Take 1 capsule (150 mg total) by mouth 2 (two) times daily. 180 capsule 1   Probiotic Product (CULTURELLE PROBIOTICS PO) Take 1 capsule by mouth daily.     rosuvastatin  (CRESTOR) 10 MG tablet TAKE 1 TABLET(10 MG) BY MOUTH DAILY 90 tablet 1   traZODone (DESYREL) 150 MG tablet Take 1 tablet (150 mg total) by mouth at bedtime. (Patient taking differently: Take 150 mg by mouth at bedtime as needed for sleep.) 90 tablet 3   No current facility-administered medications on file prior to visit.     ROS see history of present illness  Objective  Physical Exam Vitals:   12/10/22 1103  BP: 122/76  Pulse: 77  Temp: 98.4 F (36.9 C)  SpO2: 95%    BP Readings from Last 3 Encounters:  12/10/22 122/76  09/04/22 135/66  08/27/22 (!) 175/68   Wt Readings from Last 3 Encounters:  12/10/22 203 lb 6.4 oz (92.3 kg)  12/09/22 200 lb (90.7 kg)  09/03/22 200 lb (90.7 kg)    Physical Exam Constitutional:      General: She is not in acute distress.    Appearance: She is not diaphoretic.  Cardiovascular:     Rate and Rhythm: Normal rate and regular rhythm.     Heart sounds: Normal heart sounds.  Pulmonary:     Effort: Pulmonary effort is normal.     Breath sounds: Normal breath sounds.  Musculoskeletal:     Right lower leg: No edema.     Left lower leg: No edema.     Comments: Varicose veins over the anterior portion of her right lower leg  Skin:    General: Skin is warm and dry.  Neurological:     Mental Status: She is alert.      Assessment/Plan: Please see individual problem list.  Primary hypertension Assessment & Plan: Chronic issue.  Adequately controlled.  She will continue amlodipine 5 mg daily, carvedilol 6.25 mg twice daily, and losartan 100 mg daily.  Check labs today.  Orders: -     amLODIPine Besylate; Take 1 tablet (5 mg total) by mouth daily.  Dispense: 90 tablet; Refill: 3 -     Carvedilol; TAKE 1 TABLET(6.25 MG) BY MOUTH TWICE DAILY WITH A MEAL  Dispense: 180 tablet; Refill: 1 -     Basic metabolic panel  Neuropathy Assessment & Plan: Chronic issue.  Discussed that the neck step would be referring to pain management for other  treatment options though she defers this at this time.  She will me know if she changes her mind.  For now she will continue Lyrica 150 mg twice daily and monitor for any worsening of her potential side effects.   Urge incontinence Assessment & Plan: Chronic issue.  Advised that she only take Myrbetriq 50 mg daily.  She  will discontinue the Vesicare.   Asymptomatic varicose veins of right lower extremity Assessment & Plan: Discussed that treatment options would include wearing compression stockings or seeing vascular surgery to consider procedures for this.  She has compression stockings which she knows she can wear.  She defers vascular surgery referral at this time and she will let me know if she changes her mind.     Return in about 6 months (around 06/11/2023).   [name], MD [place]

## 2022-12-10 NOTE — Patient Instructions (Signed)
Nice to see you. Please stop the Vesicare. We will get lab work today and contact you with the results. If you would like to see vascular surgery or pain management please let me know.

## 2022-12-10 NOTE — Assessment & Plan Note (Signed)
Discussed that treatment options would include wearing compression stockings or seeing vascular surgery to consider procedures for this.  She has compression stockings which she knows she can wear.  She defers vascular surgery referral at this time and she will let me know if she changes her mind.

## 2022-12-10 NOTE — Assessment & Plan Note (Signed)
Chronic issue.  Discussed that the neck step would be referring to pain management for other treatment options though she defers this at this time.  She will me know if she changes her mind.  For now she will continue Lyrica 150 mg twice daily and monitor for any worsening of her potential side effects.

## 2022-12-10 NOTE — Assessment & Plan Note (Signed)
Chronic issue.  Adequately controlled.  She will continue amlodipine 5 mg daily, carvedilol 6.25 mg twice daily, and losartan 100 mg daily.  Check labs today.

## 2022-12-19 ENCOUNTER — Telehealth: Payer: Self-pay | Admitting: Family Medicine

## 2022-12-19 NOTE — Telephone Encounter (Signed)
Pt called wanting to get an antibiotic because she is having surgery on her tooth on 4/23. Pt would like to be called

## 2022-12-23 ENCOUNTER — Ambulatory Visit: Payer: Medicare Other | Admitting: Urology

## 2022-12-24 MED ORDER — AMOXICILLIN 500 MG PO CAPS: 2000.0000 mg | ORAL_CAPSULE | Freq: Once | ORAL | 0 refills | Status: AC

## 2022-12-24 NOTE — Telephone Encounter (Signed)
Left message per Patient that Dr. Birdie Sons called the Prescription in.

## 2022-12-24 NOTE — Telephone Encounter (Signed)
Sent to pharmacy 

## 2022-12-24 NOTE — Telephone Encounter (Signed)
Patient states she is having tooth surgery on 12/31/22 with Kassie Mends and he requests that you send in an antibiotic for her.

## 2023-01-06 ENCOUNTER — Ambulatory Visit: Payer: Medicare Other | Admitting: Urology

## 2023-01-06 VITALS — BP 118/68 | HR 87 | Ht 67.0 in | Wt 203.0 lb

## 2023-01-06 DIAGNOSIS — R319 Hematuria, unspecified: Secondary | ICD-10-CM

## 2023-01-06 DIAGNOSIS — N3946 Mixed incontinence: Secondary | ICD-10-CM | POA: Diagnosis not present

## 2023-01-06 MED ORDER — MIRABEGRON ER 50 MG PO TB24: 50.0000 mg | ORAL_TABLET | Freq: Every day | ORAL | 3 refills | Status: DC

## 2023-01-06 NOTE — Progress Notes (Signed)
01/06/2023 2:51 PM   Monica Larson 1945/12/27 [id number]  Referring provider: Glori Luis, MD [address] [place],  [place] [address]  No chief complaint on file.   HPI: I reviewed the note in detail. Patient was not clear on her medications so came in for this visit. Vaginal dryness stable. Urge incontinence stable. Clinically not infected     Stay on the estrogen. Stop Vesicare 10 mg and stay on Myrbetriq. Follow-up as scheduled. By history she was doing well on Myrbetriq and it is little side effects   Today Frequency and incontinence overall improved but the visit was primarily to do with her Myrbetriq.  She had multiple medicines up with her package label with many side effects underlying.  I do not think it is the cause of her dizziness or blurred vision and this was discussed.  Going on and off medications was discussed.  I think this may have been similar to the last visit.  Clinically not infected     PMH: Past Medical History:  Diagnosis Date   Arthritis    HANDS/NECK/SHOULDERS   Asthma    SEASONAL   Cancer (HCC)    skin   Diarrhea    INTERMITTENT CHRONIC   Fatigue    GERD (gastroesophageal reflux disease)    H/O motion sickness 06/17/2016   Headache    SINUS/ ALLERGIES   HLD (hyperlipidemia)    borderline   HTN (hypertension)    CONTROLLED ON MEDS   Motion sickness    Seasonal allergies    Shortness of breath dyspnea    ON EXERTION    Surgical History: Past Surgical History:  Procedure Laterality Date   ABDOMINAL HYSTERECTOMY     BREAST BIOPSY Bilateral    NEG   BREAST EXCISIONAL BIOPSY Bilateral    NEG   CARPAL TUNNEL RELEASE     right wrist   CHOLECYSTECTOMY     COLONOSCOPY N/A 01/31/2016   Procedure: COLONOSCOPY;  Surgeon: Wallace Cullens, MD;  Location: [facility];  Service: Gastroenterology;  Laterality: N/A;   EYE SURGERY     muscular as child/ CATARACT BILATERAL   SKIN CANCER EXCISION Right Wrist and thigh (x2)    Unsure of dates and Dermatologist   TOTAL KNEE ARTHROPLASTY Left 09/03/2022   Procedure: TOTAL KNEE ARTHROPLASTY;  Surgeon: Durene Romans, MD;  Location: [facility];  Service: Orthopedics;  Laterality: Left;   VESICOVAGINAL FISTULA CLOSURE W/ TAH      Home Medications:  Allergies as of 01/06/2023       Reactions   Lisinopril Cough   Shellfish Allergy Swelling   Lip swelling, tolerates shrimp    Sulfa Antibiotics Hives   Sulfonamide Derivatives Hives        Medication List        Accurate as of January 06, 2023  2:51 PM. If you have any questions, ask your nurse or doctor.          acetaminophen 500 MG tablet Commonly known as: TYLENOL Take 1,000 mg by mouth every 6 (six) hours as needed for moderate pain.   albuterol 108 (90 Base) MCG/ACT inhaler Commonly known as: VENTOLIN HFA INHALE 2 PUFFS INTO THE LUNGS EVERY 6 HOURS AS NEEDED FOR WHEEZING OR SHORTNESS OF BREATH   amLODipine 5 MG tablet Commonly known as: NORVASC Take 1 tablet (5 mg total) by mouth daily.   azelastine 0.1 % nasal spray Commonly known as: ASTELIN Place 1 spray into both nostrils  daily as needed for rhinitis. Use in each nostril as directed   carvedilol 6.25 MG tablet Commonly known as: COREG TAKE 1 TABLET(6.25 MG) BY MOUTH TWICE DAILY WITH A MEAL   CULTURELLE PROBIOTICS PO Take 1 capsule by mouth daily.   diphenhydrAMINE 25 MG tablet Commonly known as: BENADRYL Take 25 mg by mouth every 6 (six) hours as needed for allergies.   DULoxetine 30 MG capsule Commonly known as: CYMBALTA Take 1 capsule (30 mg total) by mouth daily.   estradiol 0.1 MG/GM vaginal cream Commonly known as: ESTRACE Estrogen Cream Instruction Discard applicator Apply pea sized amount to tip of finger to urethra before bed. Wash hands well after application. Use Monday, Wednesday and Friday   HYDROmorphone 2 MG tablet Commonly known as: DILAUDID Take 1 tablet (2 mg total) by mouth every 6 (six) hours as needed for severe  pain.   Leg Cramps Tabs Take 1 tablet by mouth daily as needed (leg cramps).   losartan 100 MG tablet Commonly known as: COZAAR TAKE 1 TABLET(100 MG) BY MOUTH DAILY IN THE MORNING   magnesium hydroxide 400 MG/5ML suspension Commonly known as: MILK OF MAGNESIA Take 15 mLs by mouth daily as needed for mild constipation.   meloxicam 15 MG tablet Commonly known as: MOBIC Take 1 tablet (15 mg total) by mouth daily.   methocarbamol 500 MG tablet Commonly known as: ROBAXIN Take 1 tablet (500 mg total) by mouth every 6 (six) hours as needed for muscle spasms.   mirabegron ER 50 MG Tb24 tablet Commonly known as: MYRBETRIQ Take 1 tablet (50 mg total) by mouth daily.   multivitamin with minerals tablet Take 1 tablet by mouth daily.   omeprazole 20 MG capsule Commonly known as: PRILOSEC TAKE 1 CAPSULE BY MOUTH DAILY AS NEEDED FOR STOMACH ACID OR REFLUX   OVER THE COUNTER MEDICATION Apply 1 Application topically daily as needed (leg cramps). Relaxing leg cream   polyethylene glycol 17 g packet Commonly known as: MIRALAX / GLYCOLAX Take 17 g by mouth 2 (two) times daily.   pregabalin 150 MG capsule Commonly known as: Lyrica Take 1 capsule (150 mg total) by mouth 2 (two) times daily.   rosuvastatin 10 MG tablet Commonly known as: CRESTOR TAKE 1 TABLET(10 MG) BY MOUTH DAILY   traZODone 150 MG tablet Commonly known as: DESYREL Take 1 tablet (150 mg total) by mouth at bedtime. What changed:  when to take this reasons to take this        Allergies:  Allergies  Allergen Reactions   Lisinopril Cough   Shellfish Allergy Swelling    Lip swelling, tolerates shrimp    Sulfa Antibiotics Hives   Sulfonamide Derivatives Hives    Family History: Family History  Problem Relation Age of Onset   Ovarian cancer Mother    Prostate cancer Father    Coronary artery disease Father    Diabetes Father    Heart attack Father    Heart attack Brother    Breast cancer Neg Hx      Social History:  reports that she has quit smoking. She has been exposed to tobacco smoke. She has never used smokeless tobacco. She reports current alcohol use. She reports that she does not use drugs.  ROS:                                        Physical Exam:  There were no vitals taken for this visit.  Constitutional:  Alert and oriented, No acute distress. HEENT: [place] AT, moist mucus membranes.  Trachea midline, no masses.   Laboratory Data: Lab Results  Component Value Date   WBC 10.7 (H) 09/04/2022   HGB 12.8 09/04/2022   HCT 39.5 09/04/2022   MCV 98.8 09/04/2022   PLT 176 09/04/2022    Lab Results  Component Value Date   CREATININE 1.00 12/10/2022    No results found for: "PSA"  No results found for: "TESTOSTERONE"  Lab Results  Component Value Date   HGBA1C 6.1 07/25/2022    Urinalysis    Component Value Date/Time   COLORURINE AMBER (A) 08/19/2019 1212   APPEARANCEUR Clear 06/17/2022 0944   LABSPEC 1.015 08/19/2019 1212   PHURINE  08/19/2019 1212    TEST NOT REPORTED DUE TO COLOR INTERFERENCE OF URINE PIGMENT   GLUCOSEU Negative 06/17/2022 0944   GLUCOSEU NEGATIVE 05/04/2014 1113   HGBUR (A) 08/19/2019 1212    TEST NOT REPORTED DUE TO COLOR INTERFERENCE OF URINE PIGMENT   BILIRUBINUR Negative 06/17/2022 0944   KETONESUR trace (5) (A) 04/07/2020 1428   KETONESUR (A) 08/19/2019 1212    TEST NOT REPORTED DUE TO COLOR INTERFERENCE OF URINE PIGMENT   PROTEINUR 1+ (A) 06/17/2022 0944   PROTEINUR (A) 08/19/2019 1212    TEST NOT REPORTED DUE TO COLOR INTERFERENCE OF URINE PIGMENT   UROBILINOGEN 0.2 04/07/2020 1428   UROBILINOGEN 0.2 05/04/2014 1113   NITRITE Negative 06/17/2022 0944   NITRITE (A) 08/19/2019 1212    TEST NOT REPORTED DUE TO COLOR INTERFERENCE OF URINE PIGMENT   LEUKOCYTESUR 1+ (A) 06/17/2022 0944   LEUKOCYTESUR (A) 08/19/2019 1212    TEST NOT REPORTED DUE TO COLOR INTERFERENCE OF URINE PIGMENT    Pertinent  Imaging:   Assessment & Plan: I do not think switching medication would be prudent today.  90 x 3 sent to pharmacy and I will see near  There are no diagnoses linked to this encounter.  No follow-ups on file.  Martina Sinner, MD  [facility] [address] [place], [place] [address] [phone]

## 2023-01-07 DIAGNOSIS — K080 Exfoliation of teeth due to systemic causes: Secondary | ICD-10-CM | POA: Diagnosis not present

## 2023-01-07 LAB — MICROSCOPIC EXAMINATION

## 2023-01-07 LAB — URINALYSIS, COMPLETE
Bilirubin, UA: NEGATIVE
Glucose, UA: NEGATIVE
Ketones, UA: NEGATIVE
Nitrite, UA: NEGATIVE
Protein,UA: NEGATIVE
Specific Gravity, UA: <1.005 — ABNORMAL LOW (ref 1.005–1.030)
Urobilinogen, Ur: 0.2 mg/dL (ref 0.2–1.0)
pH, UA: 5.5 (ref 5.0–7.5)

## 2023-01-28 ENCOUNTER — Ambulatory Visit: Payer: Medicare Other | Admitting: Cardiovascular Disease

## 2023-01-31 ENCOUNTER — Ambulatory Visit
Admission: RE | Admit: 2023-01-31 | Discharge: 2023-01-31 | Disposition: A | Discharge status: Home / Self Care | Payer: Medicare Other | Source: Ambulatory Visit | Attending: Family Medicine | Admitting: Family Medicine

## 2023-01-31 DIAGNOSIS — Z1231 Encounter for screening mammogram for malignant neoplasm of breast: Secondary | ICD-10-CM | POA: Diagnosis not present

## 2023-02-03 ENCOUNTER — Other Ambulatory Visit: Payer: Self-pay | Admitting: Family Medicine

## 2023-02-03 DIAGNOSIS — I10 Essential (primary) hypertension: Secondary | ICD-10-CM

## 2023-02-10 ENCOUNTER — Telehealth: Payer: Self-pay | Admitting: *Deleted

## 2023-02-10 NOTE — Telephone Encounter (Signed)
Pt calling asking for refill of Vesicare, pt states she is not taking myrbetriq due to it being so expensive. Please advise

## 2023-02-12 MED ORDER — SOLIFENACIN SUCCINATE 10 MG PO TABS: 10.0000 mg | ORAL_TABLET | Freq: Every day | ORAL | 3 refills | Status: DC

## 2023-02-12 NOTE — Telephone Encounter (Signed)
Spoke with patient and advised results rx sent to pharmacy by e-script  

## 2023-03-12 ENCOUNTER — Other Ambulatory Visit: Payer: Self-pay | Admitting: Family Medicine

## 2023-03-12 DIAGNOSIS — R319 Hematuria, unspecified: Secondary | ICD-10-CM

## 2023-03-20 DIAGNOSIS — F419 Anxiety disorder, unspecified: Secondary | ICD-10-CM | POA: Diagnosis not present

## 2023-04-06 ENCOUNTER — Other Ambulatory Visit: Payer: Self-pay | Admitting: Family Medicine

## 2023-05-15 ENCOUNTER — Other Ambulatory Visit: Payer: Self-pay | Admitting: Family Medicine

## 2023-05-15 DIAGNOSIS — I10 Essential (primary) hypertension: Secondary | ICD-10-CM

## 2023-05-15 DIAGNOSIS — E782 Mixed hyperlipidemia: Secondary | ICD-10-CM

## 2023-05-26 ENCOUNTER — Telehealth: Payer: Self-pay | Admitting: Family Medicine

## 2023-05-26 DIAGNOSIS — K219 Gastro-esophageal reflux disease without esophagitis: Secondary | ICD-10-CM

## 2023-06-05 MED ORDER — OMEPRAZOLE 20 MG PO CPDR: DELAYED_RELEASE_CAPSULE | ORAL | 1 refills | Status: DC

## 2023-06-05 NOTE — Telephone Encounter (Signed)
REFILL SENT PT NOTIFIED

## 2023-06-05 NOTE — Telephone Encounter (Signed)
Prescription Request  06/05/2023  LOV: 12/10/2022  What is the name of the medication or equipment? omeprazole   Have you contacted your pharmacy to request a refill? No   Which pharmacy would you like this sent to? [facility]   Patient notified that their request is being sent to the clinical staff for review and that they should receive a response within 2 business days.   Please advise at Mobile [phone] (mobile)

## 2023-06-05 NOTE — Addendum Note (Signed)
Addended by: Kristie Cowman on: 06/05/2023 10:31 AM   Modules accepted: Orders

## 2023-06-13 ENCOUNTER — Ambulatory Visit: Payer: Medicare Other | Admitting: Family Medicine

## 2023-06-27 ENCOUNTER — Encounter: Payer: Self-pay | Admitting: Family Medicine

## 2023-06-27 ENCOUNTER — Ambulatory Visit (INDEPENDENT_AMBULATORY_CARE_PROVIDER_SITE_OTHER): Payer: Medicare Other | Admitting: Family Medicine

## 2023-06-27 VITALS — BP 118/78 | HR 75 | Temp 98.2°F | Ht 67.0 in | Wt 210.4 lb

## 2023-06-27 DIAGNOSIS — R2689 Other abnormalities of gait and mobility: Secondary | ICD-10-CM

## 2023-06-27 DIAGNOSIS — I10 Essential (primary) hypertension: Secondary | ICD-10-CM | POA: Diagnosis not present

## 2023-06-27 DIAGNOSIS — F4323 Adjustment disorder with mixed anxiety and depressed mood: Secondary | ICD-10-CM | POA: Diagnosis not present

## 2023-06-27 DIAGNOSIS — G629 Polyneuropathy, unspecified: Secondary | ICD-10-CM

## 2023-06-27 DIAGNOSIS — N3941 Urge incontinence: Secondary | ICD-10-CM | POA: Diagnosis not present

## 2023-06-27 DIAGNOSIS — J4530 Mild persistent asthma, uncomplicated: Secondary | ICD-10-CM

## 2023-06-27 DIAGNOSIS — J45909 Unspecified asthma, uncomplicated: Secondary | ICD-10-CM | POA: Insufficient documentation

## 2023-06-27 LAB — BASIC METABOLIC PANEL
BUN: 11 mg/dL (ref 6–23)
CO2: 29 meq/L (ref 19–32)
Calcium: 9.5 mg/dL (ref 8.4–10.5)
Chloride: 103 meq/L (ref 96–112)
Creatinine, Ser: 0.83 mg/dL (ref 0.40–1.20)
GFR: 67.6 mL/min (ref >60.00)
Glucose, Bld: 168 mg/dL — ABNORMAL HIGH (ref 70–99)
Potassium: 4.1 meq/L (ref 3.5–5.1)
Sodium: 141 meq/L (ref 135–145)

## 2023-06-27 MED ORDER — BUDESONIDE-FORMOTEROL FUMARATE 80-4.5 MCG/ACT IN AERO: 2.0000 | INHALATION_SPRAY | Freq: Two times a day (BID) | RESPIRATORY_TRACT | 3 refills | Status: DC

## 2023-06-27 MED ORDER — GEMTESA 75 MG PO TABS: 75.0000 mg | ORAL_TABLET | Freq: Every day | ORAL | 2 refills | Status: DC

## 2023-06-27 MED ORDER — PREGABALIN 100 MG PO CAPS: 100.0000 mg | ORAL_CAPSULE | Freq: Two times a day (BID) | ORAL | 1 refills | Status: DC

## 2023-06-27 NOTE — Assessment & Plan Note (Signed)
Chronic issue.  Generally stable.  She will continue Cymbalta 60 mg daily.  Can discontinue trazodone.

## 2023-06-27 NOTE — Assessment & Plan Note (Signed)
Chronic issue.  Adequately controlled.  She will continue amlodipine 5 mg daily, carvedilol 6.25 mg twice daily, and losartan 100 mg daily.

## 2023-06-27 NOTE — Assessment & Plan Note (Signed)
Chronic issue.  I suspect that her neuropathy is contributing to some of her balance issues.  It is also possible she is having side effects from her medications.  We will try reducing her Lyrica dose to 100 mg twice daily and see if that benefits her with regards to the swimmy headedness.  She will monitor for worsening neuropathy symptoms on this.

## 2023-06-27 NOTE — Assessment & Plan Note (Signed)
>>  ASSESSMENT AND PLAN FOR ADJUSTMENT DISORDER WITH MIXED ANXIETY AND DEPRESSED MOOD WRITTEN ON 06/27/2023 11:06 AM BY SONNENBERG, ERIC G, MD  Chronic issue.  Generally stable.  She will continue Cymbalta  60 mg daily.  Can discontinue trazodone .

## 2023-06-27 NOTE — Assessment & Plan Note (Signed)
Chronic issue.  Discussed discontinuing Vesicare as that could be contributing to some of her symptoms.  Myrbetriq is excessively expensive so she will no longer take that either.  Will trial Gemtesa 75 mg once daily.  If this is not affordable she will let me know.

## 2023-06-27 NOTE — Assessment & Plan Note (Signed)
Chronic issue.  This is likely related to her neuropathy though some of her medications could be contributing.  She is discontinuing Vesicare.  We are adjusting her dose of Lyrica.  We will see if this things are beneficial.  I did discuss having her see PT again for this issue though she defers at this time.

## 2023-06-27 NOTE — Patient Instructions (Signed)
Nice to see you. We are going start on Symbicort to see if this helps with your asthma.  Please use this on a daily basis. We will reduce your Lyrica dose to 100 mg twice daily.  I sent a new prescription into your pharmacy for this.  Will see if this helps with your balance issues.  If your neuropathy gets worse with this dose reduction please let us know. We are going to try [facility] for your overactive bladder.  Please discontinue the Solifenacin. Please let me know if any of these medications are excessively expensive.

## 2023-06-27 NOTE — Progress Notes (Signed)
Marikay Alar, MD Phone: [phone]  Monica Larson is a 77 y.o. female who presents today for follow-up.  Hypertension: Not checking blood pressures at home.  She is taking amlodipine, carvedilol, and losartan.  She notes no chest pain.  She has some edema intermittently though notes this is related to diet as she gained 10 pounds on vacation.  Asthma: Patient reports history of asthma.  She does use albuterol inhaler and notes this helps with her shortness of breath that she gets on occasion.  She does wheeze.  Does not cough very much.  Anxiety/depression: Patient reports this is fair.  Taking Cymbalta.  Notes the trazodone does not help unless she takes 2 tablets so she does not really take this.  No SI.  Overactive bladder: Patient notes she has been taking Myrbetriq and Vesicare.  Swimmy headedness: Patient reports feeling swimmy headed.  She describes it as feeling like she is drunk.  She has balance issues.  She is fallen twice with no significant injuries.  She tried coming off the Lyrica though notes that did not seem to help.  She notes her legs got quite a bit worse with her neuropathy after coming off the Lyrica.  She is back taking Lyrica 150 mg twice daily.  Social History   Tobacco Use  Smoking Status Former   Passive exposure: Past  Smokeless Tobacco Never  Tobacco Comments   1 cig every 3 months     Current Outpatient Medications on File Prior to Visit  Medication Sig Dispense Refill   acetaminophen (TYLENOL) 500 MG tablet Take 1,000 mg by mouth every 6 (six) hours as needed for moderate pain.     albuterol (VENTOLIN HFA) 108 (90 Base) MCG/ACT inhaler INHALE 2 PUFFS INTO THE LUNGS EVERY 6 HOURS AS NEEDED FOR WHEEZING OR SHORTNESS OF BREATH 20.1 g 0   amLODipine (NORVASC) 5 MG tablet Take 1 tablet (5 mg total) by mouth daily. 90 tablet 3   azelastine (ASTELIN) 0.1 % nasal spray Place 1 spray into both nostrils daily as needed for rhinitis. Use in each nostril as  directed     carvedilol (COREG) 6.25 MG tablet TAKE 1 TABLET(6.25 MG) BY MOUTH TWICE DAILY WITH A MEAL 180 tablet 1   diphenhydrAMINE (BENADRYL) 25 MG tablet Take 25 mg by mouth every 6 (six) hours as needed for allergies.     DULoxetine (CYMBALTA) 30 MG capsule TAKE 1 CAPSULE(30 MG) BY MOUTH DAILY 90 capsule 1   estradiol (ESTRACE) 0.1 MG/GM vaginal cream Estrogen Cream Instruction Discard applicator Apply pea sized amount to tip of finger to urethra before bed. Wash hands well after application. Use Monday, Wednesday and Friday 42.5 g 12   Homeopathic Products (LEG CRAMPS) TABS Take 1 tablet by mouth daily as needed (leg cramps).     HYDROmorphone (DILAUDID) 2 MG tablet Take 1 tablet (2 mg total) by mouth every 6 (six) hours as needed for severe pain. 30 tablet 0   losartan (COZAAR) 100 MG tablet TAKE 1 TABLET(100 MG) BY MOUTH DAILY IN THE MORNING 90 tablet 1   magnesium hydroxide (MILK OF MAGNESIA) 400 MG/5ML suspension Take 15 mLs by mouth daily as needed for mild constipation.     meloxicam (MOBIC) 15 MG tablet Take 1 tablet (15 mg total) by mouth daily. 30 tablet 1   methocarbamol (ROBAXIN) 500 MG tablet Take 1 tablet (500 mg total) by mouth every 6 (six) hours as needed for muscle spasms. 40 tablet 2   Multiple  Vitamins-Minerals (MULTIVITAMIN WITH MINERALS) tablet Take 1 tablet by mouth daily.      omeprazole (PRILOSEC) 20 MG capsule TAKE 1 CAPSULE BY MOUTH DAILY AS NEEDED FOR STOMACH ACID OR REFLUX 90 capsule 1   OVER THE COUNTER MEDICATION Apply 1 Application topically daily as needed (leg cramps). Relaxing leg cream     polyethylene glycol (MIRALAX / GLYCOLAX) 17 g packet Take 17 g by mouth 2 (two) times daily. 14 each 0   Probiotic Product (CULTURELLE PROBIOTICS PO) Take 1 capsule by mouth daily.     rosuvastatin (CRESTOR) 10 MG tablet TAKE 1 TABLET(10 MG) BY MOUTH DAILY 90 tablet 1   No current facility-administered medications on file prior to visit.     ROS see history of present  illness  Objective  Physical Exam Vitals:   06/27/23 0953  BP: 118/78  Pulse: 75  Temp: 98.2 F (36.8 C)  SpO2: 95%    BP Readings from Last 3 Encounters:  06/27/23 118/78  01/06/23 118/68  12/10/22 122/76   Wt Readings from Last 3 Encounters:  06/27/23 210 lb 6.4 oz (95.4 kg)  01/06/23 203 lb (92.1 kg)  12/10/22 203 lb 6.4 oz (92.3 kg)    Physical Exam Constitutional:      General: She is not in acute distress.    Appearance: She is not diaphoretic.  HENT:     Right Ear: Tympanic membrane normal.     Left Ear: Tympanic membrane normal.  Cardiovascular:     Rate and Rhythm: Normal rate and regular rhythm.     Heart sounds: Normal heart sounds.  Pulmonary:     Effort: Pulmonary effort is normal.     Breath sounds: Normal breath sounds.  Skin:    General: Skin is warm and dry.  Neurological:     Mental Status: She is alert.     Comments: 5/5 strength in bilateral biceps, triceps, grip, quads, hamstrings, plantar and dorsiflexion, sensation to light touch reduced in bilateral lower extremities distal to the knees, intact in bilateral UE, positive Romberg, no pronator drift      Assessment/Plan: Please see individual problem list.  Primary hypertension Assessment & Plan: Chronic issue.  Adequately controlled.  She will continue amlodipine 5 mg daily, carvedilol 6.25 mg twice daily, and losartan 100 mg daily.  Orders: -     Basic metabolic panel  Neuropathy Assessment & Plan: Chronic issue.  I suspect that her neuropathy is contributing to some of her balance issues.  It is also possible she is having side effects from her medications.  We will try reducing her Lyrica dose to 100 mg twice daily and see if that benefits her with regards to the swimmy headedness.  She will monitor for worsening neuropathy symptoms on this.  Orders: -     Pregabalin; Take 1 capsule (100 mg total) by mouth 2 (two) times daily.  Dispense: 60 capsule; Refill: 1  Adjustment disorder  with mixed anxiety and depressed mood Assessment & Plan: Chronic issue.  Generally stable.  She will continue Cymbalta 60 mg daily.  Can discontinue trazodone.   Urge incontinence Assessment & Plan: Chronic issue.  Discussed discontinuing Vesicare as that could be contributing to some of her symptoms.  Myrbetriq is excessively expensive so she will no longer take that either.  Will trial Gemtesa 75 mg once daily.  If this is not affordable she will let me know.  Orders: Leslye Peer; Take 1 tablet (75 mg total)  by mouth daily.  Dispense: 30 tablet; Refill: 2  Mild persistent asthma without complication Assessment & Plan: Chronic issue.  Patient reports history of this.  Has been frequently using her albuterol inhaler.  Will trial Symbicort 2 puffs twice daily.  If this is excessively expensive she will let us know.  Orders: -     Budesonide-Formoterol Fumarate; Inhale 2 puffs into the lungs 2 (two) times daily.  Dispense: 1 each; Refill: 3  Balance problem Assessment & Plan: Chronic issue.  This is likely related to her neuropathy though some of her medications could be contributing.  She is discontinuing Vesicare.  We are adjusting her dose of Lyrica.  We will see if this things are beneficial.  I did discuss having her see PT again for this issue though she defers at this time.     Return in about 3 months (around 09/27/2023) for Asthma.   Marikay Alar, MD [facility]

## 2023-06-27 NOTE — Assessment & Plan Note (Signed)
Chronic issue.  Patient reports history of this.  Has been frequently using her albuterol inhaler.  Will trial Symbicort 2 puffs twice daily.  If this is excessively expensive she will let us know.

## 2023-07-24 ENCOUNTER — Ambulatory Visit (INDEPENDENT_AMBULATORY_CARE_PROVIDER_SITE_OTHER): Payer: Medicare Other | Admitting: Family Medicine

## 2023-07-24 ENCOUNTER — Ambulatory Visit
Admission: RE | Admit: 2023-07-24 | Discharge: 2023-07-24 | Disposition: A | Discharge status: Home / Self Care | Payer: Medicare Other | Source: Ambulatory Visit | Attending: Family Medicine | Admitting: Family Medicine

## 2023-07-24 ENCOUNTER — Encounter: Payer: Self-pay | Admitting: Family Medicine

## 2023-07-24 VITALS — BP 134/80 | HR 80 | Temp 98.1°F | Ht 67.0 in | Wt 213.0 lb

## 2023-07-24 DIAGNOSIS — M25532 Pain in left wrist: Secondary | ICD-10-CM | POA: Diagnosis not present

## 2023-07-24 DIAGNOSIS — Z043 Encounter for examination and observation following other accident: Secondary | ICD-10-CM | POA: Diagnosis not present

## 2023-07-24 DIAGNOSIS — M1812 Unilateral primary osteoarthritis of first carpometacarpal joint, left hand: Secondary | ICD-10-CM | POA: Diagnosis not present

## 2023-07-24 NOTE — Patient Instructions (Signed)
Voltaren 1% gel, over the counter ?You can apply up to 4 times a day ? ?This can be applied to any joint: knee, wrist, fingers, elbows, shoulders, feet and ankles. ?Can apply to any tendon: tennis elbow, achilles, tendon, rotator cuff or any other tendon. ? ?Minimal is absorbed in the bloodstream: ok with oral anti-inflammatory or a blood thinner. ? ?Cost is about 9 dollars  ?

## 2023-07-24 NOTE — Progress Notes (Signed)
Monica Larson T. Esco Joslyn, MD, CAQ Sports Medicine [facility] [address] [place], [address]  Phone: [phone]  FAX: [phone]  Monica Larson - 77 y.o. female  MRN [id number]  Date of Birth: 1946-02-10  Date: 07/24/2023  PCP: Glori Luis, MD  Referral: Glori Luis, MD  Chief Complaint  Patient presents with   Wrist Pain    Left    Subjective:   Monica Larson is a 77 y.o. very pleasant female patient with Body mass index is 33.36 kg/m. who presents with the following:  Pleasant patient who is a patient of Dr. Birdie Sons at [facility], and she presents for 4-week history of wrist pain after a fall where she struck the volar aspect of her wrist and distal forearm.  She does not report any significant bruising at the time of impact.  She has had continued pain since then and she has been wearing a carpal tunnel brace over the last 2 weeks or so.  She still has difficulty gripping, rotating at the wrist, flexing and extending at the wrist and she has focal pain at the distal radius, ulnar styloid, and the true wrist.  Has a sunken living room, stepped down - struck volar wrist  Ulnar styoid Distal radius  Scaphoid All tender  Review of Systems is noted in the HPI, as appropriate  Objective:   BP 134/80 (BP Location: Right Arm, Patient Position: Sitting, Cuff Size: Large)   Pulse 80   Temp 98.1 F (36.7 C) (Temporal)   Ht 5\' 7"  (1.702 m)   Wt 213 lb (96.6 kg)   SpO2 97%   BMI 33.36 kg/m   GEN: No acute distress; alert,appropriate. PULM: Breathing comfortably in no respiratory distress PSYCH: Normally interactive.   She does have tenderness at the distal radius, ulnar styloid, and in the scaphoid. No other significant tenderness in the forearm, and she has full range of motion at the elbow. She does make a full composite fist No significant tenderness in any phalanges, DIP, PIP, MCP and metacarpals  themselves. There is some mild wrist fullness She does have pain with terminal extension and flexion at the wrist, more than ulnar and radial deviation There is no bruising Grip strength is intact Otherwise neurovascularly intact  Laboratory and Imaging Data:  Assessment and Plan:     ICD-10-CM   1. Acute wrist pain, left  M25.532 DG Wrist Complete Left     On my review, there is no evidence of fracture on the patient's plain x-ray. Specifically where she is tender in the distal radius, ulnar styloid and scaphoid, there is no fracture.  Given the length of time now since initial injury, I think that there is no fracture.  The formal read by radiology is still pending.  I am going to place the patient in a thumb spica splint to limit motion at the thumb and wrist rates motion at the wrist for the next 3 weeks.  Social right now this is limiting her ability to fully use the affected hand  Medication Management during today's office visit: No orders of the defined types were placed in this encounter.  There are no discontinued medications.  Orders placed today for conditions managed today: Orders Placed This Encounter  Procedures   DG Wrist Complete Left    Disposition: No follow-ups on file.  [employer] One speech-to-text software was used for transcription in this dictation.  Possible transcriptional errors can occur  using Animal nutritionist.   Signed,  Elpidio Galea. Jaquari Reckner, MD   Outpatient Encounter Medications as of 07/24/2023  Medication Sig   acetaminophen (TYLENOL) 500 MG tablet Take 1,000 mg by mouth every 6 (six) hours as needed for moderate pain.   albuterol (VENTOLIN HFA) 108 (90 Base) MCG/ACT inhaler INHALE 2 PUFFS INTO THE LUNGS EVERY 6 HOURS AS NEEDED FOR WHEEZING OR SHORTNESS OF BREATH   amLODipine (NORVASC) 5 MG tablet Take 1 tablet (5 mg total) by mouth daily.   azelastine (ASTELIN) 0.1 % nasal spray Place 1 spray into both nostrils daily as needed for  rhinitis. Use in each nostril as directed   budesonide-formoterol (SYMBICORT) 80-4.5 MCG/ACT inhaler Inhale 2 puffs into the lungs 2 (two) times daily.   carvedilol (COREG) 6.25 MG tablet TAKE 1 TABLET(6.25 MG) BY MOUTH TWICE DAILY WITH A MEAL   diphenhydrAMINE (BENADRYL) 25 MG tablet Take 25 mg by mouth every 6 (six) hours as needed for allergies.   DULoxetine (CYMBALTA) 30 MG capsule TAKE 1 CAPSULE(30 MG) BY MOUTH DAILY   estradiol (ESTRACE) 0.1 MG/GM vaginal cream Estrogen Cream Instruction Discard applicator Apply pea sized amount to tip of finger to urethra before bed. Wash hands well after application. Use Monday, Wednesday and Friday   Homeopathic Products (LEG CRAMPS) TABS Take 1 tablet by mouth daily as needed (leg cramps).   HYDROmorphone (DILAUDID) 2 MG tablet Take 1 tablet (2 mg total) by mouth every 6 (six) hours as needed for severe pain.   losartan (COZAAR) 100 MG tablet TAKE 1 TABLET(100 MG) BY MOUTH DAILY IN THE MORNING   magnesium hydroxide (MILK OF MAGNESIA) 400 MG/5ML suspension Take 15 mLs by mouth daily as needed for mild constipation.   meloxicam (MOBIC) 15 MG tablet Take 1 tablet (15 mg total) by mouth daily.   methocarbamol (ROBAXIN) 500 MG tablet Take 1 tablet (500 mg total) by mouth every 6 (six) hours as needed for muscle spasms.   Multiple Vitamins-Minerals (MULTIVITAMIN WITH MINERALS) tablet Take 1 tablet by mouth daily.    omeprazole (PRILOSEC) 20 MG capsule TAKE 1 CAPSULE BY MOUTH DAILY AS NEEDED FOR STOMACH ACID OR REFLUX   OVER THE COUNTER MEDICATION Apply 1 Application topically daily as needed (leg cramps). Relaxing leg cream   polyethylene glycol (MIRALAX / GLYCOLAX) 17 g packet Take 17 g by mouth 2 (two) times daily.   pregabalin (LYRICA) 100 MG capsule Take 1 capsule (100 mg total) by mouth 2 (two) times daily.   Probiotic Product (CULTURELLE PROBIOTICS PO) Take 1 capsule by mouth daily.   rosuvastatin (CRESTOR) 10 MG tablet TAKE 1 TABLET(10 MG) BY MOUTH DAILY    Vibegron (GEMTESA) 75 MG TABS Take 1 tablet (75 mg total) by mouth daily.   No facility-administered encounter medications on file as of 07/24/2023.

## 2023-07-25 DIAGNOSIS — Z96652 Presence of left artificial knee joint: Secondary | ICD-10-CM | POA: Diagnosis not present

## 2023-07-25 DIAGNOSIS — M1711 Unilateral primary osteoarthritis, right knee: Secondary | ICD-10-CM | POA: Diagnosis not present

## 2023-07-31 ENCOUNTER — Telehealth: Payer: Self-pay | Admitting: Cardiology

## 2023-07-31 DIAGNOSIS — L538 Other specified erythematous conditions: Secondary | ICD-10-CM | POA: Diagnosis not present

## 2023-07-31 DIAGNOSIS — L820 Inflamed seborrheic keratosis: Secondary | ICD-10-CM | POA: Diagnosis not present

## 2023-07-31 DIAGNOSIS — L249 Irritant contact dermatitis, unspecified cause: Secondary | ICD-10-CM | POA: Diagnosis not present

## 2023-07-31 DIAGNOSIS — L821 Other seborrheic keratosis: Secondary | ICD-10-CM | POA: Diagnosis not present

## 2023-07-31 NOTE — Telephone Encounter (Signed)
   Pre-operative Risk Assessment    Patient Name: Monica Larson  DOB: [birth year] MRN: [id number]      Request for Surgical Clearance    Procedure:  Right Total Knee Arthroplasty  Date of Surgery:  Clearance 11/04/23                                 Surgeon:  Durene Romans, MD Surgeon's Group or Practice Name:  [facility] Phone number:  [phone] Fax number:  [phone]   Type of Clearance Requested:   - Medical    Type of Anesthesia:  Spinal   Additional requests/questions:    SignedNarda Amber   07/31/2023, 9:39 AM

## 2023-08-01 ENCOUNTER — Telehealth: Payer: Self-pay | Admitting: Family Medicine

## 2023-08-01 NOTE — Telephone Encounter (Signed)
Surgical clearance received for patient's knee surgery.  Patient has an appointment with me in January.  I am happy to do the surgical clearance visit at that time or she can be scheduled sooner to have it done sooner if she would like.  Please contact her regarding this.

## 2023-08-06 NOTE — Telephone Encounter (Signed)
    Primary Cardiologist:None  Chart reviewed as part of pre-operative protocol coverage. Because of Korena L Iyengar's past medical history and time since last visit, he/she will require a follow-up visit in order to better assess preoperative cardiovascular risk.  Pre-op covering staff: - Please schedule  Office appointment and call patient to inform them. - Please contact requesting surgeon's office via preferred method (i.e, phone, fax) to inform them of need for appointment prior to surgery.  If applicable, this message will also be routed to pharmacy pool and/or primary cardiologist for input on holding anticoagulant/antiplatelet agent as requested below so that this information is available at time of patient's appointment.   Ronney Asters, NP  08/06/2023, 9:52 AM

## 2023-08-06 NOTE — Telephone Encounter (Signed)
Pt is scheduled to see Frutoso Schatz 08/11/23, clearance will be addressed at that time.  Will route to requesting surgeon's office to make them aware.

## 2023-08-11 ENCOUNTER — Encounter: Payer: Self-pay | Admitting: Cardiology

## 2023-08-11 ENCOUNTER — Ambulatory Visit: Payer: Medicare Other | Attending: Cardiology | Admitting: Cardiology

## 2023-08-11 VITALS — BP 140/78 | HR 74 | Ht 67.0 in | Wt 207.4 lb

## 2023-08-11 DIAGNOSIS — R079 Chest pain, unspecified: Secondary | ICD-10-CM | POA: Diagnosis not present

## 2023-08-11 DIAGNOSIS — I700 Atherosclerosis of aorta: Secondary | ICD-10-CM | POA: Diagnosis not present

## 2023-08-11 DIAGNOSIS — Z01810 Encounter for preprocedural cardiovascular examination: Secondary | ICD-10-CM

## 2023-08-11 DIAGNOSIS — R0602 Shortness of breath: Secondary | ICD-10-CM | POA: Diagnosis not present

## 2023-08-11 DIAGNOSIS — G8929 Other chronic pain: Secondary | ICD-10-CM

## 2023-08-11 DIAGNOSIS — I10 Essential (primary) hypertension: Secondary | ICD-10-CM

## 2023-08-11 DIAGNOSIS — E782 Mixed hyperlipidemia: Secondary | ICD-10-CM

## 2023-08-11 NOTE — Progress Notes (Signed)
Cardiology Office Note:  .   Date:  08/11/2023  ID:  Monica Larson, DOB 04/12/1946, MRN [id number] PCP: Glori Luis, MD  [facility] Providers Cardiologist:  None    History of Present Illness: .   Monica Larson is a 77 y.o. female with past medical history of anxiety, former smoker, obesity, hyperlipidemia, hypertension, GERD, chronic left-sided chest pain, who presents today for follow-up and cardiac clearance for right knee surgery.   Patient with chronic left-sided chest pain underwent Lexiscan Myoview in 2017 which revealed no significant ischemia, normal wall motion, EF estimated at 79%.  Echocardiogram revealed an LVEF of 60 to 65%, no RWMA, and trivial aortic regurgitation.  Carotid duplex in 2011 revealed no evidence of hemodynamically significant carotid stenosis.  Continued chest discomfort and LAD repeat Myoview in 2021 which continue to show no significant ischemia, normal wall motion, considered low risk scan.  Repeat echocardiogram completed in 08/2022 showed LVEF of 60 to 65%, no RWMA, G1 DD, without valvular abnormalities.   She was last seen in clinic 07/30/2022.  At that time she was doing fairly well.  She did note some shortness of breath and dyspnea on exertion but denied any chest discomfort, dizziness, lightheadedness or syncope.  She was scheduled to have knee replacement done 09/03/2022 on the left.  She was scheduled for an echocardiogram but no further changes were made to her medication.  She was hospitalized on 09/03/2022 and underwent a left total knee arthroplasty.  She benefited from her hospital stay and there were no acute complications postprocedure.  She was advised to stop taking her 81 mg aspirin postoperatively.  She was considered stable for discharge and was discharged on 09/04/2022.  She returns to clinic today stating that she has been doing well.  She continues to have chronic chest discomfort only when she is upset and revealed that is  unchanged.  Chronic shortness of breath that is unchanged.  She denies any dizziness, lightheadedness, or syncope.  Previously had her left knee replacement done in December 2023.  She is scheduled for upcoming knee replacement on the right in February 2025.  She continues to suffer from neuropathy to her bilateral lower extremities and has been continued on Lyrica.  She denies any hospitalizations or visits to the emergency department.  States that she has been compliant with her current medication regimen.  ROS: 10 point review of system has been reviewed and considered negative with exception of what is been listed in the HPI  Studies Reviewed: Marland Kitchen   EKG Interpretation Date/Time:  Monday August 11 2023 15:50:48 EST Ventricular Rate:  74 PR Interval:  178 QRS Duration:  102 QT Interval:  416 QTC Calculation: 461 R Axis:   5  Text Interpretation: Normal sinus rhythm Normal ECG When compared with ECG of 10-Dec-2017 18:06, PREVIOUS ECG IS PRESENT Confirmed by Charlsie Quest ([id number]) on 08/11/2023 3:55:32 PM   2D echo 08/29/2022 1. Left ventricular ejection fraction, by estimation, is 60 to 65%. The  left ventricle has normal function. The left ventricle has no regional  wall motion abnormalities. Left ventricular diastolic parameters are  consistent with Grade I diastolic  dysfunction (impaired relaxation).   2. Right ventricular systolic function is normal. The right ventricular  size is normal. Tricuspid regurgitation signal is inadequate for assessing  PA pressure.   3. The mitral valve is normal in structure. No evidence of mitral valve  regurgitation. No evidence of mitral stenosis.   4. The aortic  valve is tricuspid. Aortic valve regurgitation is not  visualized. No aortic stenosis is present.   5. The inferior vena cava is normal in size with greater than 50%  respiratory variability, suggesting right atrial pressure of 3 mmHg.   Lexiscan MPI 11/05/2019 Pharmacological myocardial  perfusion imaging study with no significant  ischemia Normal wall motion, EF estimated at 76% No EKG changes concerning for ischemia at peak stress or in recovery. PVCs noted CT attenuation corrected images with mild aortic atherosclerosis in the arch, unable to exclude mild coronary calcification Low risk scan  2D echo 07/15/2016 - Left ventricle: The cavity size was normal. Systolic function was    normal. The estimated ejection fraction was in the range of 60%    to 65%. Wall motion was normal; there were no regional wall    motion abnormalities. Left ventricular diastolic function    parameters were normal.  - Aortic valve: There was trivial regurgitation.  - Left atrium: The atrium was normal in size.  - Right ventricle: Systolic function was normal.  - Pulmonary arteries: Systolic pressure was within the normal    range.   Lexiscan Myoview 07/02/2016 Pharmacological myocardial perfusion imaging study with no significant  ischemia Normal wall motion, EF estimated at 79% No EKG changes concerning for ischemia at peak stress or in recovery. Low risk scan  Risk Assessment/Calculations:     HYPERTENSION CONTROL Vitals:   08/11/23 1543 08/11/23 1559  BP: (!) 148/80 (!) 140/78    The patient's blood pressure is elevated above target today.  In order to address the patient's elevated BP: Blood pressure will be monitored at home to determine if medication changes need to be made.          Physical Exam:   VS:  BP (!) 140/78 (BP Location: Left Arm, Patient Position: Sitting, Cuff Size: Normal)   Pulse 74   Ht 5\' 7"  (1.702 m)   Wt 207 lb 6.4 oz (94.1 kg)   SpO2 97%   BMI 32.48 kg/m    Wt Readings from Last 3 Encounters:  08/11/23 207 lb 6.4 oz (94.1 kg)  07/24/23 213 lb (96.6 kg)  06/27/23 210 lb 6.4 oz (95.4 kg)    GEN: Well nourished, well developed in no acute distress NECK: No JVD; No carotid bruits CARDIAC: RRR, no murmurs, rubs, gallops RESPIRATORY:  Clear to  auscultation without rales, wheezing or rhonchi  ABDOMEN: Soft, non-tender, non-distended EXTREMITIES:  No edema; No deformity   ASSESSMENT AND PLAN: .   Primary hypertension with blood pressure today of 148/80 and repeat of 140/70.  She states that she is rushing to get here and recent blood pressures been running in the 120s even looking back in her chart review with her PCP.  She is continued on amlodipine 5 mg daily, carvedilol 6.25 mg twice daily and losartan 100 mg daily.  She is also encouraged to continue to do her blood pressure 1 to 2 hours postmedication administration as well.  Mixed hyperlipidemia with last LDL 68 07/2022.  She is continued on rosuvastatin 10 mg daily.  She is following up with her PCP to have updated blood work done prior to her upcoming surgery and a lipid panel will be added at that time.  Aortic atherosclerosis that was mild to moderate on CT scan.  LDL had remained at goal and she was continued on aspirin 81 mg daily and rosuvastatin 10 mg daily.  Chronic chest pain and chronic shortness of breath which  is unchanged over the last year.  EKG today revealed sinus rhythm with a rate of 74 with no ischemic changes noted.  Echocardiogram done last year revealed an EF of 60 to 65%, no RWMA, G1 DD, and no valvular abnormalities were noted.  Preoperative cardiovascular examination has been completed and she is considered low risk for perioperative complications according to ACC/AT, there is no further cardiovascular testing needed she may proceed with surgery acceptable risk.          Ms. Hobbs perioperative risk of a major cardiac event is 0.4% according to the Revised Cardiac Risk Index (RCRI).  Therefore, she is at low risk for perioperative complications.   Her functional capacity is fair at 5.62 METs according to the Duke Activity Status Index (DASI). Recommendations: According to ACC/AHA guidelines, no further cardiovascular testing needed.  The patient may  proceed to surgery at acceptable risk.    Dispo: Patient to return to clinic to see MD/APP in 6 months or sooner if needed  Signed, Olivia Royse, NP

## 2023-08-11 NOTE — Patient Instructions (Signed)
Medication Instructions:  - No changes *If you need a refill on your cardiac medications before your next appointment, please call your pharmacy*  Lab Work: - None ordered  Testing/Procedures: - None ordered  Follow-Up: At [facility], you and your health needs are our priority.  As part of our continuing mission to provide you with exceptional heart care, we have created designated Provider Care Teams.  These Care Teams include your primary Cardiologist (physician) and Advanced Practice Providers (APPs -  Physician Assistants and Nurse Practitioners) who all work together to provide you with the care you need, when you need it.  Your next appointment:   6 month(s)  Provider:   Charlsie Quest, NP

## 2023-08-14 ENCOUNTER — Other Ambulatory Visit: Payer: Self-pay

## 2023-08-14 ENCOUNTER — Telehealth: Payer: Self-pay | Admitting: Family Medicine

## 2023-08-14 DIAGNOSIS — I10 Essential (primary) hypertension: Secondary | ICD-10-CM

## 2023-08-14 DIAGNOSIS — R319 Hematuria, unspecified: Secondary | ICD-10-CM

## 2023-08-14 MED ORDER — LOSARTAN POTASSIUM 100 MG PO TABS: ORAL_TABLET | ORAL | 1 refills | Status: DC

## 2023-08-14 MED ORDER — DULOXETINE HCL 30 MG PO CPEP: 30.0000 mg | ORAL_CAPSULE | Freq: Every day | ORAL | 1 refills | Status: DC

## 2023-08-14 NOTE — Telephone Encounter (Signed)
Prescriptions sent to Pharmacy.

## 2023-08-14 NOTE — Telephone Encounter (Signed)
Prescription Request  08/14/2023  LOV: 06/27/2023  What is the name of the medication or equipment? Losartan and DULoxetine   Have you contacted your pharmacy to request a refill? No   Which pharmacy would you like this sent to? [facility]   Patient notified that their request is being sent to the clinical staff for review and that they should receive a response within 2 business days.   Please advise at Mobile [phone] (mobile)

## 2023-08-22 ENCOUNTER — Ambulatory Visit (INDEPENDENT_AMBULATORY_CARE_PROVIDER_SITE_OTHER): Payer: Medicare Other | Admitting: Family Medicine

## 2023-08-22 VITALS — BP 126/74 | HR 60 | Temp 98.2°F | Ht 67.0 in | Wt 209.0 lb

## 2023-08-22 DIAGNOSIS — R413 Other amnesia: Secondary | ICD-10-CM | POA: Diagnosis not present

## 2023-08-22 DIAGNOSIS — I10 Essential (primary) hypertension: Secondary | ICD-10-CM

## 2023-08-22 DIAGNOSIS — Z01818 Encounter for other preprocedural examination: Secondary | ICD-10-CM | POA: Insufficient documentation

## 2023-08-22 DIAGNOSIS — R7303 Prediabetes: Secondary | ICD-10-CM | POA: Diagnosis not present

## 2023-08-22 MED ORDER — CARVEDILOL 6.25 MG PO TABS: ORAL_TABLET | ORAL | 1 refills | Status: DC

## 2023-08-22 NOTE — Progress Notes (Signed)
Marikay Alar, MD Phone: [phone]  Monica Larson is a 77 y.o. female who presents today for follow-up  Preoperative clearance: Patient notes no change to chronic dyspnea and chest pain.  She has seen cardiology for surgical risk evaluation.  Patient does have prediabetes.  She does note some polydipsia that is chronic.  She has not been checking blood pressures at home.  Memory difficulty: Patient notes her memory has progressively gotten worse.  Sometimes has trouble with names and recently forgot what she had just put in a Christmas present.  Social History   Tobacco Use  Smoking Status Former   Passive exposure: Past  Smokeless Tobacco Never  Tobacco Comments   1 cig every 3 months     Current Outpatient Medications on File Prior to Visit  Medication Sig Dispense Refill   acetaminophen (TYLENOL) 500 MG tablet Take 1,000 mg by mouth every 6 (six) hours as needed for moderate pain.     albuterol (VENTOLIN HFA) 108 (90 Base) MCG/ACT inhaler INHALE 2 PUFFS INTO THE LUNGS EVERY 6 HOURS AS NEEDED FOR WHEEZING OR SHORTNESS OF BREATH 20.1 g 0   amLODipine (NORVASC) 5 MG tablet Take 1 tablet (5 mg total) by mouth daily. 90 tablet 3   azelastine (ASTELIN) 0.1 % nasal spray Place 1 spray into both nostrils daily as needed for rhinitis. Use in each nostril as directed     budesonide-formoterol (SYMBICORT) 80-4.5 MCG/ACT inhaler Inhale 2 puffs into the lungs 2 (two) times daily. 1 each 3   diphenhydrAMINE (BENADRYL) 25 MG tablet Take 25 mg by mouth every 6 (six) hours as needed for allergies.     DULoxetine (CYMBALTA) 30 MG capsule Take 1 capsule (30 mg total) by mouth daily. 90 capsule 1   estradiol (ESTRACE) 0.1 MG/GM vaginal cream Estrogen Cream Instruction Discard applicator Apply pea sized amount to tip of finger to urethra before bed. Wash hands well after application. Use Monday, Wednesday and Friday 42.5 g 12   Homeopathic Products (LEG CRAMPS) TABS Take 1 tablet by mouth daily as  needed (leg cramps).     HYDROmorphone (DILAUDID) 2 MG tablet Take 1 tablet (2 mg total) by mouth every 6 (six) hours as needed for severe pain. 30 tablet 0   losartan (COZAAR) 100 MG tablet TAKE 1 TABLET(100 MG) BY MOUTH DAILY IN THE MORNING 90 tablet 1   magnesium hydroxide (MILK OF MAGNESIA) 400 MG/5ML suspension Take 15 mLs by mouth daily as needed for mild constipation.     meloxicam (MOBIC) 15 MG tablet Take 1 tablet (15 mg total) by mouth daily. 30 tablet 1   methocarbamol (ROBAXIN) 500 MG tablet Take 1 tablet (500 mg total) by mouth every 6 (six) hours as needed for muscle spasms. 40 tablet 2   Multiple Vitamins-Minerals (MULTIVITAMIN WITH MINERALS) tablet Take 1 tablet by mouth daily.     omeprazole (PRILOSEC) 20 MG capsule TAKE 1 CAPSULE BY MOUTH DAILY AS NEEDED FOR STOMACH ACID OR REFLUX 90 capsule 1   OVER THE COUNTER MEDICATION Apply 1 Application topically daily as needed (leg cramps). Relaxing leg cream     polyethylene glycol (MIRALAX / GLYCOLAX) 17 g packet Take 17 g by mouth 2 (two) times daily. 14 each 0   pregabalin (LYRICA) 100 MG capsule Take 1 capsule (100 mg total) by mouth 2 (two) times daily. (Patient taking differently: Take 100 mg by mouth 2 (two) times daily. 100 in the morning and 150 In the evening) 60 capsule 1  Probiotic Product (CULTURELLE PROBIOTICS PO) Take 1 capsule by mouth daily.     rosuvastatin (CRESTOR) 10 MG tablet TAKE 1 TABLET(10 MG) BY MOUTH DAILY 90 tablet 1   Vibegron (GEMTESA) 75 MG TABS Take 1 tablet (75 mg total) by mouth daily. 30 tablet 2   No current facility-administered medications on file prior to visit.     ROS see history of present illness  Objective  Physical Exam Vitals:   08/22/23 1412  BP: 126/74  Pulse: 60  Temp: 98.2 F (36.8 C)    BP Readings from Last 3 Encounters:  08/22/23 126/74  08/11/23 (!) 140/78  07/24/23 134/80   Wt Readings from Last 3 Encounters:  08/22/23 209 lb (94.8 kg)  08/11/23 207 lb 6.4 oz  (94.1 kg)  07/24/23 213 lb (96.6 kg)    Physical Exam Constitutional:      General: She is not in acute distress.    Appearance: She is not diaphoretic.  Cardiovascular:     Rate and Rhythm: Normal rate and regular rhythm.     Heart sounds: Normal heart sounds.  Pulmonary:     Effort: Pulmonary effort is normal.     Breath sounds: Normal breath sounds.  Skin:    General: Skin is warm and dry.  Neurological:     Mental Status: She is alert.      Assessment/Plan: Please see individual problem list.  Prediabetes Assessment & Plan: Chronic issue.  Check A1c.  Orders: -     Hemoglobin A1c  Primary hypertension Assessment & Plan: Chronic issue.  Adequately controlled.  Continue amlodipine 5 mg daily, carvedilol 6.25 mg twice daily, and losartan 100 mg daily.  Orders: -     Carvedilol; TAKE 1 TABLET(6.25 MG) BY MOUTH TWICE DAILY WITH A MEAL  Dispense: 180 tablet; Refill: 1  Memory difficulty Assessment & Plan: Chronic difficulty with memory.  Will check B12 and TSH.  If acceptable would consider neurology evaluation for this.  Orders: -     Vitamin B12 -     TSH  Preop examination Assessment & Plan: Preoperative exam completed.  Patient is low risk medically for this procedure.  We will check lab work and as long as her A1c and CBC are acceptable she should be able to proceed with this surgery based on assessment today.  Orders: -     CBC    Return for As scheduled.   Marikay Alar, MD [facility]

## 2023-08-22 NOTE — Assessment & Plan Note (Signed)
Chronic issue.  Adequately controlled.  Continue amlodipine 5 mg daily, carvedilol 6.25 mg twice daily, and losartan 100 mg daily.

## 2023-08-22 NOTE — Assessment & Plan Note (Signed)
Chronic difficulty with memory.  Will check B12 and TSH.  If acceptable would consider neurology evaluation for this.

## 2023-08-22 NOTE — Assessment & Plan Note (Signed)
Preoperative exam completed.  Patient is low risk medically for this procedure.  We will check lab work and as long as her A1c and CBC are acceptable she should be able to proceed with this surgery based on assessment today.

## 2023-08-22 NOTE — Assessment & Plan Note (Signed)
Chronic issue.  Check A1c. 

## 2023-08-23 LAB — HEMOGLOBIN A1C
Hgb A1c MFr Bld: 6.4 %{Hb} — ABNORMAL HIGH (ref <5.7)
Mean Plasma Glucose: 137 mg/dL
eAG (mmol/L): 7.6 mmol/L

## 2023-08-23 LAB — CBC
HCT: 44.7 % (ref 35.0–45.0)
Hemoglobin: 14.7 g/dL (ref 11.7–15.5)
MCH: 31.6 pg (ref 27.0–33.0)
MCHC: 32.9 g/dL (ref 32.0–36.0)
MCV: 96.1 fL (ref 80.0–100.0)
MPV: 11 fL (ref 7.5–12.5)
Platelets: 231 10*3/uL (ref 140–400)
RBC: 4.65 10*6/uL (ref 3.80–5.10)
RDW: 12.3 % (ref 11.0–15.0)
WBC: 5.4 10*3/uL (ref 3.8–10.8)

## 2023-08-23 LAB — VITAMIN B12: Vitamin B-12: 392 pg/mL (ref 200–1100)

## 2023-08-23 LAB — TSH: TSH: 0.95 m[IU]/L (ref 0.40–4.50)

## 2023-09-16 ENCOUNTER — Other Ambulatory Visit: Payer: Self-pay | Admitting: Family Medicine

## 2023-09-16 DIAGNOSIS — E782 Mixed hyperlipidemia: Secondary | ICD-10-CM

## 2023-09-16 DIAGNOSIS — K219 Gastro-esophageal reflux disease without esophagitis: Secondary | ICD-10-CM

## 2023-09-29 ENCOUNTER — Ambulatory Visit: Payer: Medicare Other | Admitting: Family Medicine

## 2023-09-29 DIAGNOSIS — M65321 Trigger finger, right index finger: Secondary | ICD-10-CM | POA: Diagnosis not present

## 2023-09-29 DIAGNOSIS — M654 Radial styloid tenosynovitis [de Quervain]: Secondary | ICD-10-CM | POA: Diagnosis not present

## 2023-09-29 DIAGNOSIS — M6530 Trigger finger, unspecified finger: Secondary | ICD-10-CM | POA: Insufficient documentation

## 2023-09-29 HISTORY — DX: Trigger finger, unspecified finger: M65.30

## 2023-10-01 ENCOUNTER — Ambulatory Visit: Payer: Medicare Other | Admitting: Family Medicine

## 2023-10-01 DIAGNOSIS — N3941 Urge incontinence: Secondary | ICD-10-CM

## 2023-10-01 DIAGNOSIS — J40 Bronchitis, not specified as acute or chronic: Secondary | ICD-10-CM

## 2023-10-01 DIAGNOSIS — J4530 Mild persistent asthma, uncomplicated: Secondary | ICD-10-CM

## 2023-10-01 DIAGNOSIS — I10 Essential (primary) hypertension: Secondary | ICD-10-CM

## 2023-10-01 DIAGNOSIS — G629 Polyneuropathy, unspecified: Secondary | ICD-10-CM

## 2023-10-18 ENCOUNTER — Other Ambulatory Visit: Payer: Self-pay | Admitting: Family Medicine

## 2023-10-18 DIAGNOSIS — G629 Polyneuropathy, unspecified: Secondary | ICD-10-CM

## 2023-10-20 ENCOUNTER — Telehealth: Payer: Self-pay

## 2023-10-20 NOTE — Telephone Encounter (Signed)
 Copied from CRM [id number]. Topic: Clinical - Medication Question >> Oct 20, 2023  4:31 PM Adonis Hoot wrote: Reason for CRM: patient returned call and stated that she is taking 150 mg of the lyrica  twice a day.

## 2023-10-20 NOTE — Telephone Encounter (Signed)
 Which of these lyrica  prescriptions is she taking? Please call her to find out which dose she is on. Thanks.

## 2023-10-23 ENCOUNTER — Encounter (HOSPITAL_COMMUNITY): Admission: RE | Admit: 2023-10-23 | Payer: Medicare Other | Source: Ambulatory Visit

## 2023-10-24 ENCOUNTER — Encounter: Payer: Self-pay | Admitting: Family Medicine

## 2023-10-24 ENCOUNTER — Ambulatory Visit: Payer: Medicare Other | Admitting: Family Medicine

## 2023-10-24 VITALS — BP 134/70 | HR 104 | Temp 98.2°F | Wt 206.6 lb

## 2023-10-24 DIAGNOSIS — Z01818 Encounter for other preprocedural examination: Secondary | ICD-10-CM

## 2023-10-24 DIAGNOSIS — E782 Mixed hyperlipidemia: Secondary | ICD-10-CM

## 2023-10-24 DIAGNOSIS — R0981 Nasal congestion: Secondary | ICD-10-CM

## 2023-10-24 DIAGNOSIS — I10 Essential (primary) hypertension: Secondary | ICD-10-CM | POA: Diagnosis not present

## 2023-10-24 DIAGNOSIS — N3941 Urge incontinence: Secondary | ICD-10-CM

## 2023-10-24 DIAGNOSIS — G629 Polyneuropathy, unspecified: Secondary | ICD-10-CM

## 2023-10-24 DIAGNOSIS — R413 Other amnesia: Secondary | ICD-10-CM

## 2023-10-24 DIAGNOSIS — R7303 Prediabetes: Secondary | ICD-10-CM | POA: Diagnosis not present

## 2023-10-24 DIAGNOSIS — R319 Hematuria, unspecified: Secondary | ICD-10-CM

## 2023-10-24 DIAGNOSIS — K219 Gastro-esophageal reflux disease without esophagitis: Secondary | ICD-10-CM

## 2023-10-24 MED ORDER — CETIRIZINE HCL 10 MG PO TABS: 10.0000 mg | ORAL_TABLET | Freq: Every day | ORAL | 11 refills | Status: AC

## 2023-10-24 NOTE — Assessment & Plan Note (Signed)
Chronic issue.  Refer to neurology for evaluation.

## 2023-10-24 NOTE — Progress Notes (Signed)
Marikay Alar, MD Phone: [phone]  Monica Larson is a 78 y.o. female who presents today for f/u.  HYPERTENSION Disease Monitoring Home BP Monitoring not checking at home Chest pain- no    Dyspnea- chronic and stable Medications Compliance-  taking amlodipine, coreg, losartan.  Though patient notes she stopped the losartan at some point in did not note much of a difference in her blood pressure. Edema- no BMET    Component Value Date/Time   NA 141 06/27/2023 1000   NA 142 04/06/2021 1416   K 4.1 06/27/2023 1000   CL 103 06/27/2023 1000   CO2 29 06/27/2023 1000   GLUCOSE 168 (H) 06/27/2023 1000   BUN 11 06/27/2023 1000   BUN 11 04/06/2021 1416   CREATININE 0.83 06/27/2023 1000   CALCIUM 9.5 06/27/2023 1000   GFRNONAA 53 (L) 09/04/2022 0427   GFRAA >60 12/10/2017 1750   Neuropathy: Continues to have some neuropathy symptoms with some numbness in her lower legs.  She is not sure if the Lyrica is helpful.  She was on gabapentin at some point in the past.  She notes a friend mentioned to her that there was some other medication she might be able to try though she does not remember the name of this.  Incontinence: Patient continues to have issues with this.  She was not able to afford the [facility].  She is back on the Solifenacin with some benefit.  Memory difficulty: Continues to have issues with this.  She is hesitant to start medications due to potential side effects.  Congestion: This been going on a week or so.  She has some rhinorrhea and nasal congestion.  Some postnasal drip.  No sore throat.  No fever.  No cough.  Social History   Tobacco Use  Smoking Status Former   Passive exposure: Past  Smokeless Tobacco Never  Tobacco Comments   1 cig every 3 months     Current Outpatient Medications on File Prior to Visit  Medication Sig Dispense Refill   acetaminophen (TYLENOL) 500 MG tablet Take 1,000 mg by mouth every 6 (six) hours as needed for moderate pain.      albuterol (VENTOLIN HFA) 108 (90 Base) MCG/ACT inhaler INHALE 2 PUFFS INTO THE LUNGS EVERY 6 HOURS AS NEEDED FOR WHEEZING OR SHORTNESS OF BREATH 20.1 g 0   amLODipine (NORVASC) 5 MG tablet Take 1 tablet (5 mg total) by mouth daily. 90 tablet 3   azelastine (ASTELIN) 0.1 % nasal spray Place 1 spray into both nostrils daily as needed for rhinitis. Use in each nostril as directed     budesonide-formoterol (SYMBICORT) 80-4.5 MCG/ACT inhaler Inhale 2 puffs into the lungs 2 (two) times daily. 1 each 3   carvedilol (COREG) 6.25 MG tablet TAKE 1 TABLET(6.25 MG) BY MOUTH TWICE DAILY WITH A MEAL 180 tablet 1   diphenhydrAMINE (BENADRYL) 25 MG tablet Take 25 mg by mouth every 6 (six) hours as needed for allergies.     DULoxetine (CYMBALTA) 30 MG capsule Take 1 capsule (30 mg total) by mouth daily. 90 capsule 1   estradiol (ESTRACE) 0.1 MG/GM vaginal cream Estrogen Cream Instruction Discard applicator Apply pea sized amount to tip of finger to urethra before bed. Wash hands well after application. Use Monday, Wednesday and Friday 42.5 g 12   Homeopathic Products (LEG CRAMPS) TABS Take 1 tablet by mouth daily as needed (leg cramps).     HYDROmorphone (DILAUDID) 2 MG tablet Take 1 tablet (2 mg total) by  mouth every 6 (six) hours as needed for severe pain. 30 tablet 0   losartan (COZAAR) 100 MG tablet TAKE 1 TABLET(100 MG) BY MOUTH DAILY IN THE MORNING 90 tablet 1   magnesium hydroxide (MILK OF MAGNESIA) 400 MG/5ML suspension Take 15 mLs by mouth daily as needed for mild constipation.     meloxicam (MOBIC) 15 MG tablet Take 1 tablet (15 mg total) by mouth daily. 30 tablet 1   methocarbamol (ROBAXIN) 500 MG tablet Take 1 tablet (500 mg total) by mouth every 6 (six) hours as needed for muscle spasms. 40 tablet 2   Multiple Vitamins-Minerals (MULTIVITAMIN WITH MINERALS) tablet Take 1 tablet by mouth daily.     omeprazole (PRILOSEC) 20 MG capsule TAKE 1 CAPSULE BY MOUTH DAILY AS NEEDED FOR STOMACH ACID OR REFLUX 90  capsule 1   OVER THE COUNTER MEDICATION Apply 1 Application topically daily as needed (leg cramps). Relaxing leg cream     polyethylene glycol (MIRALAX / GLYCOLAX) 17 g packet Take 17 g by mouth 2 (two) times daily. 14 each 0   pregabalin (LYRICA) 150 MG capsule TAKE 1 CAPSULE(150 MG) BY MOUTH TWICE DAILY 180 capsule 0   Probiotic Product (CULTURELLE PROBIOTICS PO) Take 1 capsule by mouth daily.     rosuvastatin (CRESTOR) 10 MG tablet TAKE 1 TABLET(10 MG) BY MOUTH DAILY 90 tablet 1   Vibegron (GEMTESA) 75 MG TABS Take 1 tablet (75 mg total) by mouth daily. (Patient not taking: Reported on 10/24/2023) 30 tablet 2   No current facility-administered medications on file prior to visit.     ROS see history of present illness  Objective  Physical Exam Vitals:   10/24/23 1045  BP: 134/70  Pulse: (!) 104  Temp: 98.2 F (36.8 C)  SpO2: 98%    BP Readings from Last 3 Encounters:  10/24/23 134/70  08/22/23 126/74  08/11/23 (!) 140/78   Wt Readings from Last 3 Encounters:  10/24/23 206 lb 9.6 oz (93.7 kg)  08/22/23 209 lb (94.8 kg)  08/11/23 207 lb 6.4 oz (94.1 kg)    Physical Exam Constitutional:      General: She is not in acute distress.    Appearance: She is not diaphoretic.  Cardiovascular:     Rate and Rhythm: Normal rate and regular rhythm.     Heart sounds: Normal heart sounds.  Pulmonary:     Effort: Pulmonary effort is normal.     Breath sounds: Normal breath sounds.  Skin:    General: Skin is warm and dry.  Neurological:     Mental Status: She is alert.      Assessment/Plan: Please see individual problem list.  Neuropathy Assessment & Plan: Chronic.  Refer to neurology.  Patient will continue Lyrica 150 mg twice daily.  Orders: -     Ambulatory referral to Neurology  Primary hypertension Assessment & Plan: Chronic issue.  Patient would like to stop the losartan altogether.  Discussed she could try this given that she noted no difference with her blood  pressure previously though she needs to check her blood pressure consistently and if it trends up from what it was today she needs to restart the losartan.  She will continue amlodipine 5 mg daily and carvedilol 6.25 mg twice daily.   Urge incontinence Assessment & Plan: Chronic issue.  She will continue to see urology.   Prediabetes  Memory difficulty Assessment & Plan: Chronic issue.  Refer to neurology for evaluation.  Orders: -  Ambulatory referral to Neurology  Congestion of nasal sinus Assessment & Plan: Possibly viral or allergy related.  She has been taking Benadryl and I advised that she stop that and restart Zyrtec.  This was sent to her pharmacy.  She will also restart Astelin nasal spray.  She will let us know if she needs a refill on this.  Orders: -     Cetirizine HCl; Take 1 tablet (10 mg total) by mouth daily.  Dispense: 30 tablet; Refill: 11    Return in about 6 months (around 04/22/2024) for transfer of care.   Marikay Alar, MD [facility]

## 2023-10-24 NOTE — Assessment & Plan Note (Signed)
Chronic.  Refer to neurology.  Patient will continue Lyrica 150 mg twice daily.

## 2023-10-24 NOTE — Assessment & Plan Note (Signed)
Possibly viral or allergy related.  She has been taking Benadryl and I advised that she stop that and restart Zyrtec.  This was sent to her pharmacy.  She will also restart Astelin nasal spray.  She will let us know if she needs a refill on this.

## 2023-10-24 NOTE — Patient Instructions (Signed)
Neurology should contact you for a visit.

## 2023-10-24 NOTE — Assessment & Plan Note (Signed)
Chronic issue.  She will continue to see urology.

## 2023-10-24 NOTE — Assessment & Plan Note (Signed)
Chronic issue.  Patient would like to stop the losartan altogether.  Discussed she could try this given that she noted no difference with her blood pressure previously though she needs to check her blood pressure consistently and if it trends up from what it was today she needs to restart the losartan.  She will continue amlodipine 5 mg daily and carvedilol 6.25 mg twice daily.

## 2023-10-27 NOTE — Addendum Note (Signed)
 Addended by: Glori Luis on: 10/27/2023 03:36 PM   Modules accepted: Orders

## 2023-10-31 DIAGNOSIS — H52203 Unspecified astigmatism, bilateral: Secondary | ICD-10-CM | POA: Diagnosis not present

## 2023-11-03 DIAGNOSIS — L570 Actinic keratosis: Secondary | ICD-10-CM | POA: Diagnosis not present

## 2023-11-03 DIAGNOSIS — L820 Inflamed seborrheic keratosis: Secondary | ICD-10-CM | POA: Diagnosis not present

## 2023-11-03 DIAGNOSIS — D2272 Melanocytic nevi of left lower limb, including hip: Secondary | ICD-10-CM | POA: Diagnosis not present

## 2023-11-03 DIAGNOSIS — D485 Neoplasm of uncertain behavior of skin: Secondary | ICD-10-CM | POA: Diagnosis not present

## 2023-11-03 DIAGNOSIS — C44712 Basal cell carcinoma of skin of right lower limb, including hip: Secondary | ICD-10-CM | POA: Diagnosis not present

## 2023-11-03 DIAGNOSIS — D2261 Melanocytic nevi of right upper limb, including shoulder: Secondary | ICD-10-CM | POA: Diagnosis not present

## 2023-11-03 DIAGNOSIS — D225 Melanocytic nevi of trunk: Secondary | ICD-10-CM | POA: Diagnosis not present

## 2023-11-03 DIAGNOSIS — C44519 Basal cell carcinoma of skin of other part of trunk: Secondary | ICD-10-CM | POA: Diagnosis not present

## 2023-11-03 DIAGNOSIS — D2262 Melanocytic nevi of left upper limb, including shoulder: Secondary | ICD-10-CM | POA: Diagnosis not present

## 2023-11-03 DIAGNOSIS — R208 Other disturbances of skin sensation: Secondary | ICD-10-CM | POA: Diagnosis not present

## 2023-11-03 DIAGNOSIS — L538 Other specified erythematous conditions: Secondary | ICD-10-CM | POA: Diagnosis not present

## 2023-11-04 ENCOUNTER — Ambulatory Visit (HOSPITAL_COMMUNITY): Admit: 2023-11-04 | Payer: Medicare Other | Admitting: Orthopedic Surgery

## 2023-11-04 SURGERY — ARTHROPLASTY, KNEE, TOTAL
Anesthesia: Spinal | Site: Knee | Laterality: Right

## 2023-12-08 ENCOUNTER — Ambulatory Visit: Payer: Medicare Other | Admitting: Urology

## 2023-12-08 DIAGNOSIS — R319 Hematuria, unspecified: Secondary | ICD-10-CM | POA: Diagnosis not present

## 2023-12-08 DIAGNOSIS — N3946 Mixed incontinence: Secondary | ICD-10-CM

## 2023-12-08 LAB — URINALYSIS, COMPLETE
Bilirubin, UA: NEGATIVE
Glucose, UA: NEGATIVE
Ketones, UA: NEGATIVE
Nitrite, UA: NEGATIVE
Specific Gravity, UA: 1.025 (ref 1.005–1.030)
Urobilinogen, Ur: 0.2 mg/dL (ref 0.2–1.0)
pH, UA: 5.5 (ref 5.0–7.5)

## 2023-12-08 LAB — MICROSCOPIC EXAMINATION: WBC, UA: >30 /HPF — AB (ref 0–5)

## 2023-12-08 MED ORDER — SOLIFENACIN SUCCINATE 10 MG PO TABS: 10.0000 mg | ORAL_TABLET | Freq: Every day | ORAL | 3 refills | Status: AC

## 2023-12-08 NOTE — Progress Notes (Signed)
 12/08/2023 10:07 AM   Monica Larson 02-Aug-1946 [id number]  Referring provider: Glori Luis, MD No address on file  Chief Complaint  Patient presents with   Follow-up    HPI: I reviewed the note in detail. Patient was not clear on her medications so came in for this visit. Vaginal dryness stable. Urge incontinence stable. Clinically not infected      Stay on the estrogen. Stop Vesicare 10 mg and stay on Myrbetriq. Follow-up as scheduled. By history she was doing well on Myrbetriq and it is little side effects    Today Frequency and incontinence overall improved but the visit was primarily to do with her Myrbetriq.  She had multiple medicines up with her package label with many side effects underlying.  I do not think it is the cause of her dizziness or blurred vision and this was discussed.  Going on and off medications was discussed.  I think this may have been similar to the last visit.  Clinically not infected I do not think switching medication was prudent and 90 x 3 sent to pharmacy  Today Frequency improved.  Urge incontinence much better.  Due to $100 co-pay went back on Vesicare 10 mg.  Clinically not infected   PMH: Past Medical History:  Diagnosis Date   Arthritis    HANDS/NECK/SHOULDERS   Asthma    SEASONAL   Cancer (HCC)    skin   Diarrhea    INTERMITTENT CHRONIC   Fatigue    GERD (gastroesophageal reflux disease)    H/O motion sickness 06/17/2016   Headache    SINUS/ ALLERGIES   HLD (hyperlipidemia)    borderline   HTN (hypertension)    CONTROLLED ON MEDS   Motion sickness    Seasonal allergies    Shortness of breath dyspnea    ON EXERTION    Surgical History: Past Surgical History:  Procedure Laterality Date   ABDOMINAL HYSTERECTOMY     BREAST BIOPSY Bilateral    NEG   BREAST EXCISIONAL BIOPSY Bilateral    NEG   CARPAL TUNNEL RELEASE     right wrist   CHOLECYSTECTOMY     COLONOSCOPY N/A 01/31/2016   Procedure: COLONOSCOPY;  Surgeon:  Wallace Cullens, MD;  Location: [facility];  Service: Gastroenterology;  Laterality: N/A;   EYE SURGERY     muscular as child/ CATARACT BILATERAL   SKIN CANCER EXCISION Right Wrist and thigh (x2)   Unsure of dates and Dermatologist   TOTAL KNEE ARTHROPLASTY Left 09/03/2022   Procedure: TOTAL KNEE ARTHROPLASTY;  Surgeon: Durene Romans, MD;  Location: [facility];  Service: Orthopedics;  Laterality: Left;   VESICOVAGINAL FISTULA CLOSURE W/ TAH      Home Medications:  Allergies as of 12/08/2023       Reactions   Lisinopril Cough   Shellfish Allergy Swelling   Lip swelling, tolerates shrimp    Sulfa Antibiotics Hives   Sulfonamide Derivatives Hives        Medication List        Accurate as of December 08, 2023 10:07 AM. If you have any questions, ask your nurse or doctor.          STOP taking these medications    Gemtesa 75 MG Tabs Generic drug: Vibegron       TAKE these medications    acetaminophen 500 MG tablet Commonly known as: TYLENOL Take 1,000 mg by mouth every 6 (six) hours as needed for moderate pain.  albuterol 108 (90 Base) MCG/ACT inhaler Commonly known as: VENTOLIN HFA INHALE 2 PUFFS INTO THE LUNGS EVERY 6 HOURS AS NEEDED FOR WHEEZING OR SHORTNESS OF BREATH   amLODipine 5 MG tablet Commonly known as: NORVASC Take 1 tablet (5 mg total) by mouth daily.   azelastine 0.1 % nasal spray Commonly known as: ASTELIN Place 1 spray into both nostrils daily as needed for rhinitis. Use in each nostril as directed   budesonide-formoterol 80-4.5 MCG/ACT inhaler Commonly known as: SYMBICORT Inhale 2 puffs into the lungs 2 (two) times daily.   carvedilol 6.25 MG tablet Commonly known as: COREG TAKE 1 TABLET(6.25 MG) BY MOUTH TWICE DAILY WITH A MEAL   cetirizine 10 MG tablet Commonly known as: ZYRTEC Take 1 tablet (10 mg total) by mouth daily.   CULTURELLE PROBIOTICS PO Take 1 capsule by mouth daily.   diphenhydrAMINE 25 MG tablet Commonly known as:  BENADRYL Take 25 mg by mouth every 6 (six) hours as needed for allergies.   DULoxetine 30 MG capsule Commonly known as: CYMBALTA Take 1 capsule (30 mg total) by mouth daily.   estradiol 0.1 MG/GM vaginal cream Commonly known as: ESTRACE Estrogen Cream Instruction Discard applicator Apply pea sized amount to tip of finger to urethra before bed. Wash hands well after application. Use Monday, Wednesday and Friday   HYDROmorphone 2 MG tablet Commonly known as: DILAUDID Take 1 tablet (2 mg total) by mouth every 6 (six) hours as needed for severe pain.   Leg Cramps Tabs Take 1 tablet by mouth daily as needed (leg cramps).   losartan 100 MG tablet Commonly known as: COZAAR TAKE 1 TABLET(100 MG) BY MOUTH DAILY IN THE MORNING   magnesium hydroxide 400 MG/5ML suspension Commonly known as: MILK OF MAGNESIA Take 15 mLs by mouth daily as needed for mild constipation.   meloxicam 15 MG tablet Commonly known as: MOBIC Take 1 tablet (15 mg total) by mouth daily.   methocarbamol 500 MG tablet Commonly known as: ROBAXIN Take 1 tablet (500 mg total) by mouth every 6 (six) hours as needed for muscle spasms.   multivitamin with minerals tablet Take 1 tablet by mouth daily.   omeprazole 20 MG capsule Commonly known as: PRILOSEC TAKE 1 CAPSULE BY MOUTH DAILY AS NEEDED FOR STOMACH ACID OR REFLUX   OVER THE COUNTER MEDICATION Apply 1 Application topically daily as needed (leg cramps). Relaxing leg cream   polyethylene glycol 17 g packet Commonly known as: MIRALAX / GLYCOLAX Take 17 g by mouth 2 (two) times daily.   pregabalin 150 MG capsule Commonly known as: LYRICA TAKE 1 CAPSULE(150 MG) BY MOUTH TWICE DAILY   rosuvastatin 10 MG tablet Commonly known as: CRESTOR TAKE 1 TABLET(10 MG) BY MOUTH DAILY        Allergies:  Allergies  Allergen Reactions   Lisinopril Cough   Shellfish Allergy Swelling    Lip swelling, tolerates shrimp    Sulfa Antibiotics Hives   Sulfonamide  Derivatives Hives    Family History: Family History  Problem Relation Age of Onset   Ovarian cancer Mother    Prostate cancer Father    Coronary artery disease Father    Diabetes Father    Heart attack Father    Heart attack Brother    Breast cancer Neg Hx     Social History:  reports that she has quit smoking. She has been exposed to tobacco smoke. She has never used smokeless tobacco. She reports current alcohol use. She reports that she  does not use drugs.  ROS:                                        Physical Exam: There were no vitals taken for this visit.  Constitutional:  Alert and oriented, No acute distress. HEENT: [place] AT, moist mucus membranes.  Trachea midline, no masses.  Laboratory Data: Lab Results  Component Value Date   WBC 5.4 08/22/2023   HGB 14.7 08/22/2023   HCT 44.7 08/22/2023   MCV 96.1 08/22/2023   PLT 231 08/22/2023    Lab Results  Component Value Date   CREATININE 0.83 06/27/2023    No results found for: "PSA"  No results found for: "TESTOSTERONE"  Lab Results  Component Value Date   HGBA1C 6.4 (H) 08/22/2023    Urinalysis    Component Value Date/Time   COLORURINE AMBER (A) 08/19/2019 1212   APPEARANCEUR Hazy (A) 01/06/2023 1455   LABSPEC 1.015 08/19/2019 1212   PHURINE  08/19/2019 1212    TEST NOT REPORTED DUE TO COLOR INTERFERENCE OF URINE PIGMENT   GLUCOSEU Negative 01/06/2023 1455   GLUCOSEU NEGATIVE 05/04/2014 1113   HGBUR (A) 08/19/2019 1212    TEST NOT REPORTED DUE TO COLOR INTERFERENCE OF URINE PIGMENT   BILIRUBINUR Negative 01/06/2023 1455   KETONESUR trace (5) (A) 04/07/2020 1428   KETONESUR (A) 08/19/2019 1212    TEST NOT REPORTED DUE TO COLOR INTERFERENCE OF URINE PIGMENT   PROTEINUR Negative 01/06/2023 1455   PROTEINUR (A) 08/19/2019 1212    TEST NOT REPORTED DUE TO COLOR INTERFERENCE OF URINE PIGMENT   UROBILINOGEN 0.2 04/07/2020 1428   UROBILINOGEN 0.2 05/04/2014 1113   NITRITE Negative  01/06/2023 1455   NITRITE (A) 08/19/2019 1212    TEST NOT REPORTED DUE TO COLOR INTERFERENCE OF URINE PIGMENT   LEUKOCYTESUR 1+ (A) 01/06/2023 1455   LEUKOCYTESUR (A) 08/19/2019 1212    TEST NOT REPORTED DUE TO COLOR INTERFERENCE OF URINE PIGMENT    Pertinent Imaging:   Assessment & Plan: Vesicare 10 mg 90 x 3 sent to pharmacy and I will see in a year  1. Mixed incontinence (Primary)  - Urinalysis, Complete  2. Hematuria, unspecified type  - Urinalysis, Complete   No follow-ups on file.  Martina Sinner, MD  [facility] [address] [place], [place] [address] [phone]

## 2023-12-10 ENCOUNTER — Ambulatory Visit (INDEPENDENT_AMBULATORY_CARE_PROVIDER_SITE_OTHER): Payer: Medicare Other | Admitting: *Deleted

## 2023-12-10 VITALS — Ht 67.0 in | Wt 200.0 lb

## 2023-12-10 DIAGNOSIS — Z0000 Encounter for general adult medical examination without abnormal findings: Secondary | ICD-10-CM | POA: Diagnosis not present

## 2023-12-10 NOTE — Patient Instructions (Signed)
 Monica Larson , Thank you for taking time to come for your Medicare Wellness Visit. I appreciate your ongoing commitment to your health goals. Please review the following plan we discussed and let me know if I can assist you in the future.   Referrals/Orders/Follow-Ups/Clinician Recommendations: None  This is a list of the screening recommended for you and due dates:  Health Maintenance  Topic Date Due   COVID-19 Vaccine (6 - 2024-25 season) 05/11/2023   Mammogram  01/31/2024   Flu Shot  04/09/2024   Medicare Annual Wellness Visit  12/09/2024   DTaP/Tdap/Td vaccine (2 - Td or Tdap) 11/10/2030   Pneumonia Vaccine  Completed   DEXA scan (bone density measurement)  Completed   Hepatitis C Screening  Completed   Zoster (Shingles) Vaccine  Completed   HPV Vaccine  Aged Out   Colon Cancer Screening  Discontinued    Advanced directives: (Copy Requested) Please bring a copy of your health care power of attorney and living will to the office to be added to your chart at your convenience. You can mail to [facility] [address] [place], [place] [address] or email to [email or web address]  Next Medicare Annual Wellness Visit scheduled for next year: Yes 12/14/24 @ 10:10 Managing Pain Without Opioids Opioids are strong medicines used to treat moderate to severe pain. For some people, especially those who have long-term (chronic) pain, opioids may not be the best choice for pain management due to: Side effects like nausea, constipation, and sleepiness. The risk of addiction (opioid use disorder). The longer you take opioids, the greater your risk of addiction. Pain that lasts for more than 3 months is called chronic pain. Managing chronic pain usually requires more than one approach and is often provided by a team of health care providers working together (multidisciplinary approach). Pain management may be done at a pain management center or pain clinic. How to manage pain without  the use of opioids Use non-opioid medicines Non-opioid medicines for pain may include: Over-the-counter or prescription non-steroidal anti-inflammatory drugs (NSAIDs). These may be the first medicines used for pain. They work well for muscle and bone pain, and they reduce swelling. Acetaminophen. This over-the-counter medicine may work well for milder pain but not swelling. Antidepressants. These may be used to treat chronic pain. A certain type of antidepressant (tricyclics) is often used. These medicines are given in lower doses for pain than when used for depression. Anticonvulsants. These are usually used to treat seizures but may also reduce nerve (neuropathic) pain. Muscle relaxants. These relieve pain caused by sudden muscle tightening (spasms). You may also use a pain medicine that is applied to the skin as a patch, cream, or gel (topical analgesic), such as a numbing medicine. These may cause fewer side effects than medicines taken by mouth. Do certain therapies as directed Some therapies can help with pain management. They include: Physical therapy. You will do exercises to gain strength and flexibility. A physical therapist may teach you exercises to move and stretch parts of your body that are weak, stiff, or painful. You can learn these exercises at physical therapy visits and practice them at home. Physical therapy may also involve: Massage. Heat wraps or applying heat or cold to affected areas. Electrical signals that interrupt pain signals (transcutaneous electrical nerve stimulation, TENS). Weak lasers that reduce pain and swelling (low-level laser therapy). Signals from your body that help you learn to regulate pain (biofeedback). Occupational therapy. This helps you to learn ways  to function at home and work with less pain. Recreational therapy. This involves trying new activities or hobbies, such as a physical activity or drawing. Mental health therapy, including: Cognitive  behavioral therapy (CBT). This helps you learn coping skills for dealing with pain. Acceptance and commitment therapy (ACT) to change the way you think and react to pain. Relaxation therapies, including muscle relaxation exercises and mindfulness-based stress reduction. Pain management counseling. This may be individual, family, or group counseling.  Receive medical treatments Medical treatments for pain management include: Nerve block injections. These may include a pain blocker and anti-inflammatory medicines. You may have injections: Near the spine to relieve chronic back or neck pain. Into joints to relieve back or joint pain. Into nerve areas that supply a painful area to relieve body pain. Into muscles (trigger point injections) to relieve some painful muscle conditions. A medical device placed near your spine to help block pain signals and relieve nerve pain or chronic back pain (spinal cord stimulation device). Acupuncture. Follow these instructions at home Medicines Take over-the-counter and prescription medicines only as told by your health care provider. If you are taking pain medicine, ask your health care providers about possible side effects to watch out for. Do not drive or use heavy machinery while taking prescription opioid pain medicine. Lifestyle  Do not use drugs or alcohol to reduce pain. If you drink alcohol, limit how much you have to: 0-1 drink a day for women who are not pregnant. 0-2 drinks a day for men. Know how much alcohol is in a drink. In the [place] one drink equals one 12 oz bottle of beer (355 mL), one 5 oz glass of wine (148 mL), or one 1 oz glass of hard liquor (44 mL). Do not use any products that contain nicotine or tobacco. These products include cigarettes, chewing tobacco, and vaping devices, such as e-cigarettes. If you need help quitting, ask your health care provider. Eat a healthy diet and maintain a healthy weight. Poor diet and excess weight  may make pain worse. Eat foods that are high in fiber. These include fresh fruits and vegetables, whole grains, and beans. Limit foods that are high in fat and processed sugars, such as fried and sweet foods. Exercise regularly. Exercise lowers stress and may help relieve pain. Ask your health care provider what activities and exercises are safe for you. If your health care provider approves, join an exercise class that combines movement and stress reduction. Examples include yoga and tai chi. Get enough sleep. Lack of sleep may make pain worse. Lower stress as much as possible. Practice stress reduction techniques as told by your therapist. General instructions Work with all your pain management providers to find the treatments that work best for you. You are an important member of your pain management team. There are many things you can do to reduce pain on your own. Consider joining an online or in-person support group for people who have chronic pain. Keep all follow-up visits. This is important. Where to find more information You can find more information about managing pain without opioids from: American Academy of Pain Medicine: painmed.org Institute for Chronic Pain: instituteforchronicpain.org American Chronic Pain Association: theacpa.org Contact a health care provider if: You have side effects from pain medicine. Your pain gets worse or does not get better with treatments or home therapy. You are struggling with anxiety or depression. Summary Many types of pain can be managed without opioids. Chronic pain may respond better to pain management  without opioids. Pain is best managed when you and a team of health care providers work together. Pain management without opioids may include non-opioid medicines, medical treatments, physical therapy, mental health therapy, and lifestyle changes. Tell your health care providers if your pain gets worse or is not being managed well  enough. This information is not intended to replace advice given to you by your health care provider. Make sure you discuss any questions you have with your health care provider. Document Revised: 12/06/2020 Document Reviewed: 12/06/2020  Elsevier Patient Education  2024 [employer].

## 2023-12-10 NOTE — Progress Notes (Signed)
 Subjective:   Monica Larson is a 78 y.o. who presents for a Medicare Wellness preventive visit.  Visit Complete: Virtual I connected with  Monica Larson on 12/10/23 by a audio enabled telemedicine application and verified that I am speaking with the correct person using two identifiers.  Patient Location: Home  Provider Location: Office/Clinic  I discussed the limitations of evaluation and management by telemedicine. The patient expressed understanding and agreed to proceed.  Vital Signs: Because this visit was a virtual/telehealth visit, some criteria may be missing or patient reported. Any vitals not documented were not able to be obtained and vitals that have been documented are patient reported.  VideoDeclined- This patient declined Librarian, academic. Therefore the visit was completed with audio only.  Persons Participating in Visit: Patient.  AWV Questionnaire: No: Patient Medicare AWV questionnaire was not completed prior to this visit.  Cardiac Risk Factors include: advanced age (>59men, >49 women);dyslipidemia;hypertension;obesity (BMI >30kg/m2)     Objective:    Today's Vitals   12/10/23 0940  Weight: 200 lb (90.7 kg)  Height: 5\' 7"  (1.702 m)  PainSc: 5    Body mass index is 31.32 kg/m.     12/10/2023   10:01 AM 12/09/2022    9:42 AM 09/03/2022    6:00 PM 08/27/2022    1:15 PM 11/06/2021    8:33 AM 11/03/2020    8:49 AM 10/26/2019   10:42 AM  Advanced Directives  Does Patient Have a Medical Advance Directive? Yes Yes Yes Yes Yes Yes Yes  Type of Estate agent of [place];Living will Healthcare Power of [place];Living will Living will Healthcare Power of [place];Living will Healthcare Power of [place];Living will Healthcare Power of [place];Living will Healthcare Power of Attorney  Does patient want to make changes to medical advance directive?  No - Patient declined No - Patient declined  No - Patient declined No -  Patient declined No - Patient declined  Copy of Healthcare Power of Attorney in Chart? No - copy requested No - copy requested No - copy requested No - copy requested No - copy requested No - copy requested No - copy requested    Current Medications (verified) Outpatient Encounter Medications as of 12/10/2023  Medication Sig   acetaminophen (TYLENOL) 500 MG tablet Take 1,000 mg by mouth every 6 (six) hours as needed for moderate pain.   albuterol (VENTOLIN HFA) 108 (90 Base) MCG/ACT inhaler INHALE 2 PUFFS INTO THE LUNGS EVERY 6 HOURS AS NEEDED FOR WHEEZING OR SHORTNESS OF BREATH   amLODipine (NORVASC) 5 MG tablet Take 1 tablet (5 mg total) by mouth daily.   azelastine (ASTELIN) 0.1 % nasal spray Place 1 spray into both nostrils daily as needed for rhinitis. Use in each nostril as directed   budesonide-formoterol (SYMBICORT) 80-4.5 MCG/ACT inhaler Inhale 2 puffs into the lungs 2 (two) times daily.   carvedilol (COREG) 6.25 MG tablet TAKE 1 TABLET(6.25 MG) BY MOUTH TWICE DAILY WITH A MEAL   diphenhydrAMINE (BENADRYL) 25 MG tablet Take 25 mg by mouth every 6 (six) hours as needed for allergies.   DULoxetine (CYMBALTA) 30 MG capsule Take 1 capsule (30 mg total) by mouth daily.   estradiol (ESTRACE) 0.1 MG/GM vaginal cream Estrogen Cream Instruction Discard applicator Apply pea sized amount to tip of finger to urethra before bed. Wash hands well after application. Use Monday, Wednesday and Friday   Homeopathic Products (LEG CRAMPS) TABS Take 1 tablet by mouth daily as needed (leg cramps).  HYDROmorphone (DILAUDID) 2 MG tablet Take 1 tablet (2 mg total) by mouth every 6 (six) hours as needed for severe pain.   loratadine (CLARITIN) 10 MG tablet Take 10 mg by mouth daily.   losartan (COZAAR) 100 MG tablet TAKE 1 TABLET(100 MG) BY MOUTH DAILY IN THE MORNING   magnesium hydroxide (MILK OF MAGNESIA) 400 MG/5ML suspension Take 15 mLs by mouth daily as needed for mild constipation.   meloxicam (MOBIC) 15 MG  tablet Take 1 tablet (15 mg total) by mouth daily.   methocarbamol (ROBAXIN) 500 MG tablet Take 1 tablet (500 mg total) by mouth every 6 (six) hours as needed for muscle spasms.   Multiple Vitamins-Minerals (MULTIVITAMIN WITH MINERALS) tablet Take 1 tablet by mouth daily.   omeprazole (PRILOSEC) 20 MG capsule TAKE 1 CAPSULE BY MOUTH DAILY AS NEEDED FOR STOMACH ACID OR REFLUX   OVER THE COUNTER MEDICATION Apply 1 Application topically daily as needed (leg cramps). Relaxing leg cream   polyethylene glycol (MIRALAX / GLYCOLAX) 17 g packet Take 17 g by mouth 2 (two) times daily.   pregabalin (LYRICA) 150 MG capsule TAKE 1 CAPSULE(150 MG) BY MOUTH TWICE DAILY   Probiotic Product (CULTURELLE PROBIOTICS PO) Take 1 capsule by mouth daily.   rosuvastatin (CRESTOR) 10 MG tablet TAKE 1 TABLET(10 MG) BY MOUTH DAILY   solifenacin (VESICARE) 10 MG tablet Take 1 tablet (10 mg total) by mouth daily.   cetirizine (ZYRTEC) 10 MG tablet Take 1 tablet (10 mg total) by mouth daily. (Patient not taking: Reported on 12/10/2023)   No facility-administered encounter medications on file as of 12/10/2023.    Allergies (verified) Lisinopril, Shellfish allergy, Sulfa antibiotics, and Sulfonamide derivatives   History: Past Medical History:  Diagnosis Date   Arthritis    HANDS/NECK/SHOULDERS   Asthma    SEASONAL   Cancer (HCC)    skin   Diarrhea    INTERMITTENT CHRONIC   Fatigue    GERD (gastroesophageal reflux disease)    H/O motion sickness 06/17/2016   Headache    SINUS/ ALLERGIES   HLD (hyperlipidemia)    borderline   HTN (hypertension)    CONTROLLED ON MEDS   Motion sickness    Seasonal allergies    Shortness of breath dyspnea    ON EXERTION   Past Surgical History:  Procedure Laterality Date   ABDOMINAL HYSTERECTOMY     BREAST BIOPSY Bilateral    NEG   BREAST EXCISIONAL BIOPSY Bilateral    NEG   CARPAL TUNNEL RELEASE     right wrist   CHOLECYSTECTOMY     COLONOSCOPY N/A 01/31/2016    Procedure: COLONOSCOPY;  Surgeon: Wallace Cullens, MD;  Location: [facility];  Service: Gastroenterology;  Laterality: N/A;   EYE SURGERY     muscular as child/ CATARACT BILATERAL   SKIN CANCER EXCISION Right Wrist and thigh (x2)   Unsure of dates and Dermatologist   TOTAL KNEE ARTHROPLASTY Left 09/03/2022   Procedure: TOTAL KNEE ARTHROPLASTY;  Surgeon: Durene Romans, MD;  Location: [facility];  Service: Orthopedics;  Laterality: Left;   VESICOVAGINAL FISTULA CLOSURE W/ TAH     Family History  Problem Relation Age of Onset   Ovarian cancer Mother    Prostate cancer Father    Coronary artery disease Father    Diabetes Father    Heart attack Father    Heart attack Brother    Breast cancer Neg Hx    Social History   Socioeconomic History   Marital  status: Married    Spouse name: Not on file   Number of children: Not on file   Years of education: Not on file   Highest education level: Not on file  Occupational History   Not on file  Tobacco Use   Smoking status: Former    Passive exposure: Past   Smokeless tobacco: Never   Tobacco comments:    1 cig every 3 months   Vaping Use   Vaping status: Never Used  Substance and Sexual Activity   Alcohol use: Yes    Comment: 1 MIXED DRINK/WEEK   Drug use: No   Sexual activity: Not on file  Other Topics Concern   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    Social Drivers of Corporate investment banker Strain: Low Risk  (12/10/2023)   Overall Financial Resource Strain (CARDIA)    Difficulty of Paying Living Expenses: Not hard at all  Food Insecurity: No Food Insecurity (12/10/2023)   Hunger Vital Sign    Worried About Running Out of Food in the Last Year: Never true    Ran Out of Food in the Last Year: Never true  Transportation Needs: No Transportation Needs (12/10/2023)   PRAPARE - Administrator, Civil Service (Medical): No    Lack of Transportation (Non-Medical): No   Physical Activity: Inactive (12/10/2023)   Exercise Vital Sign    Days of Exercise per Week: 0 days    Minutes of Exercise per Session: 0 min  Stress: No Stress Concern Present (12/10/2023)   [employer] of Occupational Health - Occupational Stress Questionnaire    Feeling of Stress : Only a little  Social Connections: Socially Integrated (12/10/2023)   Social Connection and Isolation Panel [NHANES]    Frequency of Communication with Friends and Family: More than three times a week    Frequency of Social Gatherings with Friends and Family: More than three times a week    Attends Religious Services: More than 4 times per year    Active Member of [employer] or Organizations: Yes    Attends Engineer, structural: More than 4 times per year    Marital Status: Married    Tobacco Counseling Counseling given: Not Answered Tobacco comments: 1 cig every 3 months     Clinical Intake:  Pre-visit preparation completed: Yes  Pain : 0-10 Pain Score: 5  Pain Location: Back Pain Orientation: Lower Pain Descriptors / Indicators: Dull Pain Onset: More than a month ago Pain Frequency: Constant     BMI - recorded: 31.32 Nutritional Risks: None Diabetes: No  Lab Results  Component Value Date   HGBA1C 6.4 (H) 08/22/2023   HGBA1C 6.1 07/25/2022   HGBA1C 5.8 03/05/2021     How often do you need to have someone help you when you read instructions, pamphlets, or other written materials from your doctor or pharmacy?: 1 - Never  Interpreter Needed?: No  Information entered by :: R. Makhiya Coburn LPN   Activities of Daily Living     12/10/2023    9:42 AM  In your present state of health, do you have any difficulty performing the following activities:  Hearing? 0  Vision? 0  Comment glasses  Difficulty concentrating or making decisions? 1  Walking or climbing stairs? 1  Dressing or bathing? 0  Doing errands, shopping? 0  Preparing Food and eating ? N  Using the Toilet? N  In the past  six months, have you accidently leaked urine? Y  Do you have problems with loss of bowel control? Y  Managing your Medications? Y  Managing your Finances? Y  Housekeeping or managing your Housekeeping? N    Patient Care Team: Glori Luis, MD (Inactive) as PCP - General (Family Medicine) Alfredo Martinez, MD as Consulting Physician (Urology)  Indicate any recent Medical Services you may have received from other than [facility] providers in the past year (date may be approximate).     Assessment:   This is a routine wellness examination for [place].  Hearing/Vision screen Hearing Screening - Comments:: No issues Vision Screening - Comments:: glasses   Goals Addressed             This Visit's Progress    Patient Stated       Wants to lose weight       Depression Screen     12/10/2023    9:56 AM 06/27/2023    9:54 AM 12/10/2022   11:04 AM 12/09/2022    9:41 AM 07/25/2022    3:29 PM 11/06/2021    8:38 AM 07/30/2021   10:27 AM  PHQ 2/9 Scores  PHQ - 2 Score 0 1 0 0 0 0 0  PHQ- 9 Score 0 7 0        Fall Risk     12/10/2023    9:47 AM 06/27/2023    9:54 AM 12/10/2022   11:04 AM 12/08/2022   10:56 AM 07/25/2022    3:29 PM  Fall Risk   Falls in the past year? 1 1 0 0 0  Number falls in past yr: 0 0 0 0 0  Injury with Fall? 0 0 0 0 0  Risk for fall due to : History of fall(s);Impaired balance/gait History of fall(s) No Fall Risks  No Fall Risks  Follow up Falls evaluation completed;Falls prevention discussed Falls evaluation completed Falls evaluation completed Falls evaluation completed;Falls prevention discussed Falls evaluation completed    MEDICARE RISK AT HOME:  Medicare Risk at Home Any stairs in or around the home?: No If so, are there any without handrails?: No Home free of loose throw rugs in walkways, pet beds, electrical cords, etc?: Yes Adequate lighting in your home to reduce risk of falls?: Yes Life alert?: No Use of a cane, walker or w/c?: No Grab bars  in the bathroom?: Yes Shower chair or bench in shower?: Yes Elevated toilet seat or a handicapped toilet?: Yes  TIMED UP AND GO:  Was the test performed?  No  Cognitive Function: 6CIT completed    12/10/2021   10:32 AM 10/26/2019   10:48 AM  MMSE - Mini Mental State Exam  Not completed:  Refused  Orientation to time 4   Orientation to Place 5   Registration 3   Attention/ Calculation 4   Recall 2   Language- name 2 objects 2   Language- repeat 1   Language- follow 3 step command 3   Language- read & follow direction 1   Write a sentence 1   Copy design 1   Total score 27         12/10/2023   10:01 AM 12/09/2022    9:45 AM 11/06/2021    9:05 AM 11/03/2020    8:53 AM 10/21/2018    9:44 AM  6CIT Screen  What Year? 0 points 0 points 0 points 0 points 0 points  What month? 0 points 0 points 0 points 0 points  0 points  What time? 0 points 0 points 0 points 0 points 0 points  Count back from 20 0 points 2 points 0 points  0 points  Months in reverse 4 points 4 points -- 2 points 4 points  Repeat phrase 2 points 0 points 10 points  0 points  Total Score 6 points 6 points   4 points    Immunizations Immunization History  Administered Date(s) Administered   Fluad Quad(high Dose 65+) 06/15/2019, 07/10/2022   Influenza Split 06/03/2011, 07/11/2022   Influenza, High Dose Seasonal PF 06/11/2016, 04/30/2017, 06/18/2018   Influenza,inj,quad, With Preservative 06/15/2019   Influenza-Unspecified 06/19/2013, 07/06/2014, 06/11/2020, 07/23/2021   PFIZER(Purple Top)SARS-COV-2 Vaccination 09/16/2019, 10/09/2019, 05/05/2020, 07/11/2022   Pfizer Covid-19 Vaccine Bivalent Booster 79yrs & up 07/23/2021   Pneumococcal Conjugate-13 11/07/2014   Pneumococcal Polysaccharide-23 07/20/2012   Rotavirus,unspecified  07/11/2022   Tdap 11/09/2020   Zoster Recombinant(Shingrix) 02/27/2018, 06/18/2018   Zoster, Live 04/09/2013    Screening Tests Health Maintenance  Topic Date Due   COVID-19 Vaccine  (6 - 2024-25 season) 05/11/2023   Medicare Annual Wellness (AWV)  12/09/2023   INFLUENZA VACCINE  04/09/2024   DTaP/Tdap/Td (2 - Td or Tdap) 11/10/2030   Pneumonia Vaccine 80+ Years old  Completed   DEXA SCAN  Completed   Hepatitis C Screening  Completed   Zoster Vaccines- Shingrix  Completed   HPV VACCINES  Aged Out   Colonoscopy  Discontinued    Health Maintenance  Health Maintenance Due  Topic Date Due   COVID-19 Vaccine (6 - 2024-25 season) 05/11/2023   Medicare Annual Wellness (AWV)  12/09/2023   Health Maintenance Items Addressed: Patient declines bone density   Covid vaccine declined  Additional Screening:  Vision Screening: Recommended annual ophthalmology exams for early detection of glaucoma and other disorders of the eye  .Up to date  [facility]   Dental Screening: Recommended annual dental exams for proper oral hygiene  Community Resource Referral / Chronic Care Management: CRR required this visit?  No   CCM required this visit?  No     Plan:     I have personally reviewed and noted the following in the patient's chart:   Medical and social history Use of alcohol, tobacco or illicit drugs  Current medications and supplements including opioid prescriptions. Patient is currently taking opioid prescriptions. Information provided to patient regarding non-opioid alternatives. Patient advised to discuss non-opioid treatment plan with their provider. Functional ability and status Nutritional status Physical activity Advanced directives List of other physicians Hospitalizations, surgeries, and ER visits in previous 12 months Vitals Screenings to include cognitive, depression, and falls Referrals and appointments  In addition, I have reviewed and discussed with patient certain preventive protocols, quality metrics, and best practice recommendations. A written personalized care plan for preventive services as well as general preventive health  recommendations were provided to patient.     Sydell Axon, LPN   09/14/1094   After Visit Summary: (MyChart) Due to this being a telephonic visit, the after visit summary with patients personalized plan was offered to patient via MyChart   Notes: Nothing significant to report at this time.

## 2024-01-01 DIAGNOSIS — C44712 Basal cell carcinoma of skin of right lower limb, including hip: Secondary | ICD-10-CM | POA: Diagnosis not present

## 2024-01-05 ENCOUNTER — Ambulatory Visit: Payer: Medicare Other | Admitting: Urology

## 2024-01-15 DIAGNOSIS — C44519 Basal cell carcinoma of skin of other part of trunk: Secondary | ICD-10-CM | POA: Diagnosis not present

## 2024-01-27 DIAGNOSIS — N951 Menopausal and female climacteric states: Secondary | ICD-10-CM | POA: Diagnosis not present

## 2024-01-27 DIAGNOSIS — F3341 Major depressive disorder, recurrent, in partial remission: Secondary | ICD-10-CM | POA: Diagnosis not present

## 2024-02-05 ENCOUNTER — Ambulatory Visit: Payer: Self-pay | Admitting: Neurology

## 2024-02-16 ENCOUNTER — Ambulatory Visit (INDEPENDENT_AMBULATORY_CARE_PROVIDER_SITE_OTHER)

## 2024-02-16 VITALS — BP 124/62 | HR 66 | Ht 67.0 in | Wt 203.8 lb

## 2024-02-16 DIAGNOSIS — R413 Other amnesia: Secondary | ICD-10-CM

## 2024-02-16 DIAGNOSIS — L309 Dermatitis, unspecified: Secondary | ICD-10-CM | POA: Insufficient documentation

## 2024-02-16 DIAGNOSIS — I10 Essential (primary) hypertension: Secondary | ICD-10-CM | POA: Diagnosis not present

## 2024-02-16 DIAGNOSIS — Z1231 Encounter for screening mammogram for malignant neoplasm of breast: Secondary | ICD-10-CM

## 2024-02-16 DIAGNOSIS — F39 Unspecified mood [affective] disorder: Secondary | ICD-10-CM

## 2024-02-16 DIAGNOSIS — K219 Gastro-esophageal reflux disease without esophagitis: Secondary | ICD-10-CM

## 2024-02-16 DIAGNOSIS — I83813 Varicose veins of bilateral lower extremities with pain: Secondary | ICD-10-CM

## 2024-02-16 DIAGNOSIS — G629 Polyneuropathy, unspecified: Secondary | ICD-10-CM

## 2024-02-16 DIAGNOSIS — R7303 Prediabetes: Secondary | ICD-10-CM

## 2024-02-16 DIAGNOSIS — R2689 Other abnormalities of gait and mobility: Secondary | ICD-10-CM

## 2024-02-16 DIAGNOSIS — E782 Mixed hyperlipidemia: Secondary | ICD-10-CM

## 2024-02-16 DIAGNOSIS — N3941 Urge incontinence: Secondary | ICD-10-CM

## 2024-02-16 MED ORDER — OMEPRAZOLE 20 MG PO CPDR: DELAYED_RELEASE_CAPSULE | ORAL | 1 refills | Status: DC

## 2024-02-16 MED ORDER — LOSARTAN POTASSIUM 100 MG PO TABS: ORAL_TABLET | ORAL | 1 refills | Status: DC

## 2024-02-16 MED ORDER — CARVEDILOL 6.25 MG PO TABS: ORAL_TABLET | ORAL | 1 refills | Status: DC

## 2024-02-16 MED ORDER — ROSUVASTATIN CALCIUM 10 MG PO TABS: ORAL_TABLET | ORAL | 1 refills | Status: DC

## 2024-02-16 MED ORDER — AMLODIPINE BESYLATE 5 MG PO TABS: 5.0000 mg | ORAL_TABLET | Freq: Every day | ORAL | 3 refills | Status: DC

## 2024-02-16 MED ORDER — DULOXETINE HCL 30 MG PO CPEP: 30.0000 mg | ORAL_CAPSULE | Freq: Every day | ORAL | 1 refills | Status: DC

## 2024-02-16 MED ORDER — PREGABALIN 150 MG PO CAPS: 150.0000 mg | ORAL_CAPSULE | Freq: Two times a day (BID) | ORAL | 0 refills | Status: DC

## 2024-02-16 MED ORDER — HYDROCORTISONE 1 % EX OINT: 1.0000 | TOPICAL_OINTMENT | Freq: Two times a day (BID) | CUTANEOUS | 0 refills | Status: AC

## 2024-02-16 NOTE — Assessment & Plan Note (Signed)
 Management and plan per urology.

## 2024-02-16 NOTE — Assessment & Plan Note (Signed)
 Chronic issue and patient believes it's getting worse.  Refer to [facility] neurology for evaluation.

## 2024-02-16 NOTE — Assessment & Plan Note (Signed)
 Chronic issue.  BP stable.  Continue Amlodipine  5 mg once a day, Losartan  100 mg daily and carvedilol  6.25 mg twice daily. Check CMP, urine microalbumin today.  Check home BP, three times a week, keep BP log to be reviewed during f/u appointment to see if antihypertensive dose adjustment is needed.

## 2024-02-16 NOTE — Assessment & Plan Note (Signed)
 Has seen vascular surgery in the past. Discussed reevaluation by vascular surgery, patient declined at this time. Recommend wearing compression stockings, raising legs, regular exercise. Patient's neuropathic pain could also be mistaken for pain related to varicose pain. She verbalizes understanding.

## 2024-02-16 NOTE — Assessment & Plan Note (Signed)
 Chronic issue.  Check A1c.

## 2024-02-16 NOTE — Assessment & Plan Note (Signed)
 Anxiety: Chronic issue.  Continues to take Duloxetine  30 mg daily. Discussed tapering off vs continuing on Duloxetine  since patient also has peripheral neuropathy. Will continue, refill sent.

## 2024-02-16 NOTE — Assessment & Plan Note (Signed)
 Check lipid panel today, continue Rosuvastatin  10 mg daily.

## 2024-02-16 NOTE — Assessment & Plan Note (Addendum)
 Chronic.  Refer to neurology.  Patient will continue Lyrica  150 mg twice daily. Also refer plan to b/l lower extremities varicose vein.  PDMP reviewed, last refill for Lyrica  was on 10/21/23, refill sent.

## 2024-02-16 NOTE — Assessment & Plan Note (Signed)
 Stable on Omeprazole  20 mg, had B 12 on low normal side on 08/2023, can consider starting 1000 mcg B 12 daily.

## 2024-02-16 NOTE — Progress Notes (Signed)
 Established Patient Office Visit. TOC from Dr. Lovetta Rucks. Last OV with him was on 10/24/23.    Subjective  Patient ID: Monica Larson, female    DOB: 1946/06/26  Age: 78 y.o. MRN: [id number]  Chief Complaint  Patient presents with   Transfer of Care    She  has a past medical history of Acquired trigger finger (09/29/2023), Arthritis, Asthma, Cancer (HCC), Diarrhea, Fatigue, GERD (gastroesophageal reflux disease), H/O motion sickness (06/17/2016), Headache, HLD (hyperlipidemia), HTN (hypertension), Motion sickness, Seasonal allergies, and Shortness of breath dyspnea.  HPI  Hypertension:  Only check BP when she feels unwell at home. Taking amlodipine  5 mg daily, coreg  6.25 twice a day, losartan  100 mg daily. She had a visit from insurance nurse who checked her home BP which was 104/68 mmHg. She was seen by cardiology on 08/11/23, was recommended to f/u in about 6 months. Does not have appointment with them.   Memory concerns, gait problem: She feels uncoordinated on her feet almost like she is drunk for about 2 years.  She has seen neurologist in [place] but did not like his care. She feels like she cannot remember names, believes memory has been declining. In one week she drinks occasionally.   Neuropathy of lower legs: On Lyrica  150 mg twice a day. She is concerned about the side effects. She tried tapering off Lyrica  caused pain, burning sensations on her feet.   Incontinence: Sees urology Dr. Clarke Crouch.   Mixed hyperlipidemia: On Crestor  10 mg once a day. She eats mostly homemade food.   She has Methocarbamol  500 mg every 6 hourly prn listed to her medication list but patient does not recall taking this. She has paper with her current medication list does not have this medication on it either.   Chronic bronchitis from COVID 19: Currently asymptomatic, no cough, wheezing. Has albuterol  inhaler as needed. Last use was about one week. Also has Symbicort  2 puffs, twice a day prescribed  to her but has not been using this.   Anxiety:Taking Duloxetine  30 mg daily. Can't tell if it is really helping but reports mood has been stable.    GERD: Takes Omeprazole  or Prilosec 20 mg once a day. Reports symptoms has been stable.   Prediabetes: 08/22/23: A1c: was 6.4%  Red, itchy bumps on chest after being on sun for a while. Has been applying moisturizer/lotions on it.    ROS As per HPI    Objective:      BP 124/62   Pulse 66   Ht 5\' 7"  (1.702 m)   Wt 203 lb 12.8 oz (92.4 kg)   SpO2 94%   BMI 31.92 kg/m      02/16/2024   11:13 AM 12/10/2023    9:56 AM 06/27/2023    9:54 AM  Depression screen PHQ 2/9  Decreased Interest 0 0 1  Down, Depressed, Hopeless 0 0 0  PHQ - 2 Score 0 0 1  Altered sleeping 1 0 0  Tired, decreased energy 1 0 2  Change in appetite 1 0 2  Feeling bad or failure about yourself  0 0 0  Trouble concentrating 0 0 2  Moving slowly or fidgety/restless 0 0 0  Suicidal thoughts 0 0 0  PHQ-9 Score 3 0 7  Difficult doing work/chores Somewhat difficult Not difficult at all Not difficult at all      02/16/2024   11:14 AM 06/27/2023    9:55 AM 12/10/2022   11:05 AM  GAD 7 :  Generalized Anxiety Score  Nervous, Anxious, on Edge 0 0 0  Control/stop worrying 0 0 0  Worry too much - different things 0 0 0  Trouble relaxing 0 0 0  Restless 0 0 0  Easily annoyed or irritable 0 0 0  Afraid - awful might happen 0 0 0  Total GAD 7 Score 0 0 0  Anxiety Difficulty Not difficult at all Not difficult at all Not difficult at all      02/16/2024   11:13 AM 12/10/2023    9:56 AM 06/27/2023    9:54 AM  Depression screen PHQ 2/9  Decreased Interest 0 0 1  Down, Depressed, Hopeless 0 0 0  PHQ - 2 Score 0 0 1  Altered sleeping 1 0 0  Tired, decreased energy 1 0 2  Change in appetite 1 0 2  Feeling bad or failure about yourself  0 0 0  Trouble concentrating 0 0 2  Moving slowly or fidgety/restless 0 0 0  Suicidal thoughts 0 0 0  PHQ-9 Score 3 0 7  Difficult  doing work/chores Somewhat difficult Not difficult at all Not difficult at all      02/16/2024   11:14 AM 06/27/2023    9:55 AM 12/10/2022   11:05 AM  GAD 7 : Generalized Anxiety Score  Nervous, Anxious, on Edge 0 0 0  Control/stop worrying 0 0 0  Worry too much - different things 0 0 0  Trouble relaxing 0 0 0  Restless 0 0 0  Easily annoyed or irritable 0 0 0  Afraid - awful might happen 0 0 0  Total GAD 7 Score 0 0 0  Anxiety Difficulty Not difficult at all Not difficult at all Not difficult at all   SDOH Screenings   Food Insecurity: No Food Insecurity (12/10/2023)  Housing: Unknown (12/10/2023)  Transportation Needs: No Transportation Needs (12/10/2023)  Utilities: Not At Risk (12/10/2023)  Alcohol  Screen: Low Risk  (12/10/2023)  Depression (PHQ2-9): Low Risk  (02/16/2024)  Financial Resource Strain: Low Risk  (12/10/2023)  Physical Activity: Inactive (12/10/2023)  Social Connections: Socially Integrated (12/10/2023)  Stress: No Stress Concern Present (12/10/2023)  Tobacco Use: Medium Risk (02/16/2024)  Health Literacy: Inadequate Health Literacy (12/10/2023)     Physical Exam HENT:     Head: Normocephalic and atraumatic.     Mouth/Throat:     Mouth: Mucous membranes are moist.  Eyes:     Pupils: Pupils are equal, round, and reactive to light.  Cardiovascular:     Rate and Rhythm: Normal rate.     Pulses:          Dorsalis pedis pulses are 2+ on the right side and 2+ on the left side.       Posterior tibial pulses are 2+ on the right side and 2+ on the left side.  Pulmonary:     Breath sounds: No wheezing.  Abdominal:     General: Abdomen is protuberant.     Palpations: Abdomen is soft.     Tenderness: There is no abdominal tenderness. There is no guarding.  Musculoskeletal:     Cervical back: Neck supple.     Right lower leg: No edema.     Left lower leg: No edema.     Comments: B/L lower extremities noted.  Inspection: Bilateral varicose veins noted, primarily along the saphenous  vein distributions. No skin ulcerations observed. Palpation: No tenderness or palpable cords. Normal skin temperature. Edema:No edema     Feet:  Right foot:     Protective Sensation: 5 sites tested.  4 sites sensed.     Skin integrity: No skin breakdown.     Left foot:     Protective Sensation: 5 sites tested.  4 sites sensed.     Skin integrity: No skin breakdown.  Lymphadenopathy:     Cervical: No cervical adenopathy.  Skin:    General: Skin is warm.  Neurological:     Mental Status: She is alert and oriented to person, place, and time.     Cranial Nerves: Cranial nerves 2-12 are intact.     Motor: No tremor.     Gait: Gait normal.     Comments: Mini-cog normal  Psychiatric:        Mood and Affect: Mood normal.        No results found for any visits on 02/16/24.    Assessment & Plan:   Primary hypertension Assessment & Plan: Chronic issue.  BP stable.  Continue Amlodipine  5 mg once a day, Losartan  100 mg daily and carvedilol  6.25 mg twice daily. Check CMP, urine microalbumin today.  Check home BP, three times a week, keep BP log to be reviewed during f/u appointment to see if antihypertensive dose adjustment is needed.   Orders: -     amLODIPine  Besylate; Take 1 tablet (5 mg total) by mouth daily.  Dispense: 90 tablet; Refill: 3 -     Carvedilol ; TAKE 1 TABLET(6.25 MG) BY MOUTH TWICE DAILY WITH A MEAL  Dispense: 180 tablet; Refill: 1 -     Losartan  Potassium; TAKE 1 TABLET(100 MG) BY MOUTH DAILY IN THE MORNING  Dispense: 90 tablet; Refill: 1 -     Comprehensive metabolic panel with GFR -     Microalbumin / creatinine urine ratio -     Ambulatory referral to Neurology  Essential hypertension Assessment & Plan: Chronic issue.  BP stable.  Continue Amlodipine  5 mg once a day, Losartan  100 mg daily and carvedilol  6.25 mg twice daily. Check CMP, urine microalbumin today.  Check home BP, three times a week, keep BP log to be reviewed during f/u appointment to see  if antihypertensive dose adjustment is needed.   Orders: -     Losartan  Potassium; TAKE 1 TABLET(100 MG) BY MOUTH DAILY IN THE MORNING  Dispense: 90 tablet; Refill: 1  Gastroesophageal reflux disease, unspecified whether esophagitis present Assessment & Plan: Stable on Omeprazole  20 mg, had B 12 on low normal side on 08/2023, can consider starting 1000 mcg B 12 daily.   Orders: -     Omeprazole ; TAKE 1 CAPSULE BY MOUTH DAILY AS NEEDED FOR STOMACH ACID OR REFLUX  Dispense: 90 capsule; Refill: 1  Mixed hyperlipidemia Assessment & Plan: Check lipid panel today, continue Rosuvastatin  10 mg daily.   Orders: -     Rosuvastatin  Calcium ; TAKE 1 TABLET(10 MG) BY MOUTH DAILY  Dispense: 90 tablet; Refill: 1 -     Lipid panel  Encounter for screening mammogram for malignant neoplasm of breast Assessment & Plan: Given patient's age risks/benefits of getting screening mammogram discussed. Patient prefers to continuing with screening for breast cancer. Referral made, phone number given to patient to schedule for appointment.   Orders: -     3D Screening Mammogram, Left and Right; Future  Prediabetes Assessment & Plan: Chronic issue.  Check A1c.  Orders: -     Hemoglobin A1c  Dermatitis Assessment & Plan: On chest, from sun exposure, apply hydrocortisone 1% cream, thin layer,  twice a day for 1 week. S/e of long term use of hydrocortisone discussed.   Orders: -     Hydrocortisone; Apply 1 Application topically 2 (two) times daily for 7 days.  Dispense: 21 g; Refill: 0  Memory difficulty Assessment & Plan: Chronic issue and patient believes it's getting worse.  Refer to [facility] neurology for evaluation.  Orders: -     Ambulatory referral to Neurology  Mood disorder [facility]) Assessment & Plan: Anxiety: Chronic issue.  Continues to take Duloxetine  30 mg daily. Discussed tapering off vs continuing on Duloxetine  since patient also has peripheral neuropathy. Will continue, refill sent.    Orders: -     DULoxetine  HCl; Take 1 capsule (30 mg total) by mouth daily.  Dispense: 90 capsule; Refill: 1  Balance problem Assessment & Plan: Chronic issue.  This is likely related to her neuropathy, age.  Continue Lyrica  150 mg BID. Has seen neurology in the past in [place] is interested in establishing care with a neurologist local. [facility] neurology clinic referral made today. Has deferred PT in the past. Can reconsider physical therapy for strengthening, balance in the future. Will discuss this during follow up visit.     Neuropathy Assessment & Plan: Chronic.  Refer to neurology.  Patient will continue Lyrica  150 mg twice daily. Also refer plan to b/l lower extremities varicose vein.  PDMP reviewed, last refill for Lyrica  was on 10/21/23, refill sent.   Orders: -     Pregabalin ; Take 1 capsule (150 mg total) by mouth 2 (two) times daily.  Dispense: 180 capsule; Refill: 0  Varicose veins of lower extremity with pain, bilateral Assessment & Plan: Has seen vascular surgery in the past. Discussed reevaluation by vascular surgery, patient declined at this time. Recommend wearing compression stockings, raising legs, regular exercise. Patient's neuropathic pain could also be mistaken for pain related to varicose pain. She verbalizes understanding.    Urge incontinence Assessment & Plan: Management and plan per urology.   I spent 45 minutes on the day of this face-to-face encounter reviewing the patient's medical and surgical history, medications, ongoing concerns, and reviewing the assessment and plan with the patient. This time also included counseling the patient on their health conditions and management options. Additionally, I spent time post-visit ordering and reviewing diagnostics and therapeutics with the patient.   Return in about 6 months (around 08/17/2024) for chronic follow up.   Jacklin Mascot, MD

## 2024-02-16 NOTE — Patient Instructions (Addendum)
 YOUR MAMMOGRAM IS DUE, PLEASE CALL AND GET THIS SCHEDULED! [facility] - call [phone]    Apply Hydrocortisone 1% cream on chest, thin layer, twice a day for one week. Please don't use this for more than a week to reduce risk of skin thinning.    I will have a neurology referral to [facility] clinic. Please reach out to us  if you do not hear from them in 2 weeks.    Check blood pressure three times a week, please keep log of your blood pressure and let's review it together when I see you for a follow up.

## 2024-02-16 NOTE — Assessment & Plan Note (Signed)
 On chest, from sun exposure, apply hydrocortisone 1% cream, thin layer, twice a day for 1 week. S/e of long term use of hydrocortisone discussed.

## 2024-02-16 NOTE — Assessment & Plan Note (Signed)
 Given patient's age risks/benefits of getting screening mammogram discussed. Patient prefers to continuing with screening for breast cancer. Referral made, phone number given to patient to schedule for appointment.

## 2024-02-16 NOTE — Assessment & Plan Note (Signed)
 Chronic issue.  This is likely related to her neuropathy, age.  Continue Lyrica  150 mg BID. Has seen neurology in the past in [place] is interested in establishing care with a neurologist local. [facility] neurology clinic referral made today. Has deferred PT in the past. Can reconsider physical therapy for strengthening, balance in the future. Will discuss this during follow up visit.

## 2024-02-17 LAB — COMPREHENSIVE METABOLIC PANEL WITH GFR
ALT: 12 IU/L (ref 0–32)
AST: 20 IU/L (ref 0–40)
Albumin: 4.1 g/dL (ref 3.8–4.8)
Alkaline Phosphatase: 73 IU/L (ref 44–121)
BUN/Creatinine Ratio: 11 — ABNORMAL LOW (ref 12–28)
BUN: 12 mg/dL (ref 8–27)
Bilirubin Total: 0.3 mg/dL (ref 0.0–1.2)
CO2: 19 mmol/L — ABNORMAL LOW (ref 20–29)
Calcium: 9.1 mg/dL (ref 8.7–10.3)
Chloride: 107 mmol/L — ABNORMAL HIGH (ref 96–106)
Creatinine, Ser: 1.09 mg/dL — ABNORMAL HIGH (ref 0.57–1.00)
Globulin, Total: 2.3 g/dL (ref 1.5–4.5)
Sodium: 143 mmol/L (ref 134–144)
Total Protein: 6.4 g/dL (ref 6.0–8.5)
eGFR: 52 mL/min/{1.73_m2} — ABNORMAL LOW (ref >59)

## 2024-02-17 LAB — LIPID PANEL
Chol/HDL Ratio: 3.1 ratio (ref 0.0–4.4)
Cholesterol, Total: 140 mg/dL (ref 100–199)
HDL: 45 mg/dL (ref >39)
LDL Chol Calc (NIH): 69 mg/dL (ref 0–99)
Triglycerides: 154 mg/dL — ABNORMAL HIGH (ref 0–149)
VLDL Cholesterol Cal: 26 mg/dL (ref 5–40)

## 2024-02-17 LAB — HEMOGLOBIN A1C
Est. average glucose Bld gHb Est-mCnc: 123 mg/dL
Hgb A1c MFr Bld: 5.9 % — ABNORMAL HIGH (ref 4.8–5.6)

## 2024-02-17 LAB — MICROALBUMIN / CREATININE URINE RATIO
Creatinine, Urine: 83.8 mg/dL
Microalb/Creat Ratio: 33 mg/g{creat} — ABNORMAL HIGH (ref 0–29)
Microalbumin, Urine: 27.5 ug/mL

## 2024-02-18 ENCOUNTER — Ambulatory Visit: Payer: Self-pay

## 2024-02-18 DIAGNOSIS — R7989 Other specified abnormal findings of blood chemistry: Secondary | ICD-10-CM | POA: Insufficient documentation

## 2024-02-18 DIAGNOSIS — R809 Proteinuria, unspecified: Secondary | ICD-10-CM | POA: Insufficient documentation

## 2024-02-18 NOTE — Progress Notes (Signed)
 Please call the patient to let her know her HbA1c is improved compared to last lab. Cholesterol looks stable except for mildly elevated triglycerides. We will continue to monitor and cutting down on carbohydrates, sugar is recommended. Kidney function suggestive of mild dehydration and mild presence of protein in urine. Recommend staying hydrated and repeat kidney function in 1 week to make sure it is normalized or improved.   Thank you,  Jacklin Mascot, MD

## 2024-02-26 ENCOUNTER — Other Ambulatory Visit

## 2024-03-05 ENCOUNTER — Ambulatory Visit: Attending: Cardiology | Admitting: Cardiology

## 2024-03-05 ENCOUNTER — Encounter: Payer: Self-pay | Admitting: Cardiology

## 2024-03-05 VITALS — BP 132/62 | HR 65 | Ht 67.0 in | Wt 203.4 lb

## 2024-03-05 DIAGNOSIS — R079 Chest pain, unspecified: Secondary | ICD-10-CM | POA: Diagnosis not present

## 2024-03-05 DIAGNOSIS — R0602 Shortness of breath: Secondary | ICD-10-CM | POA: Diagnosis not present

## 2024-03-05 DIAGNOSIS — G8929 Other chronic pain: Secondary | ICD-10-CM

## 2024-03-05 DIAGNOSIS — I700 Atherosclerosis of aorta: Secondary | ICD-10-CM

## 2024-03-05 DIAGNOSIS — I10 Essential (primary) hypertension: Secondary | ICD-10-CM | POA: Diagnosis not present

## 2024-03-05 DIAGNOSIS — E782 Mixed hyperlipidemia: Secondary | ICD-10-CM

## 2024-03-05 NOTE — Progress Notes (Signed)
 Cardiology Office Note   Date:  03/05/2024  ID:  Monica, Larson [birth year], MRN [id number] PCP: Abbey Bruckner, MD  [facility] Providers Cardiologist:  None     History of Present Illness Monica Larson is a 78 y.o. female with a past medical history of anxiety, former smoker, obesity, hyperlipidemia, hypertension, GERD, chronic left-sided chest pain, who presents today for follow-up.   Patient with chronic left-sided chest pain underwent Lexiscan  Myoview  in 2017 which revealed no significant ischemia, normal wall motion, EF estimated at 79%.  Echocardiogram revealed an LVEF of 60 to 65%, no RWMA, and trivial aortic regurgitation.  Carotid duplex in 2011 revealed no evidence of hemodynamically significant carotid stenosis.  Continued chest discomfort and LAD repeat Myoview  in 2021 which continue to show no significant ischemia, normal wall motion, considered low risk scan.  Repeat echocardiogram completed in 08/2022 showed LVEF of 60 to 65%, no RWMA, G1 DD, without valvular abnormalities.  She was seen in clinic 07/2022 at that time doing well.  She had some shortness of breath and dyspnea on exertion without any chest discomfort dizziness lightheadedness.  She was scheduled for knee replacement on 08/2022.  She was scheduled for an echocardiogram and no further changes were made to her medications.  She was hospitalized 08/2022 and underwent left total knee arthroplasty.There were no acute complications postoperatively.  She was advised to stop taking her aspirin  81 mg postoperatively.  She was considered stable for discharge and discharged on 09/04/2022.   She was last seen in clinic 08/11/2023 and was doing well for the cardiac perspective.  She continued chronic chest discomfort but only when she was upset and it was unchanged.  Chronic shortness of breath was unchanged.  She stated that she had upcoming right knee replacement in February 2025.  Continues to suffer from neuropathy had  been continued on Lyrica  previously.  There were no medication changes that were made and no further testing that was ordered.  She returns clinic today states that overall she has been doing well.  She continues to have chronic chest discomfort only when she is upset.  She has chronic shortness of breath that is unchanged.  She has occasional swelling to her bilateral lower extremities and continues to suffer from neuropathy.  She previously had a left knee replacement in December 2023 states that she is still trying to sinus.  The right done currently she has left wrist pain.  States that she recently had labs done with her primary care provider.  She states she has been compliant with her current medication regimen without any undue side effects.  She also denies any hospitalizations or visits to the emergency department.  ROS: 10 point review of system has been reviewed and considered negative exception what is listed in the HPI  Studies Reviewed EKG Interpretation Date/Time:  Friday March 05 2024 10:43:35 EDT Ventricular Rate:  65 PR Interval:  174 QRS Duration:  82 QT Interval:  402 QTC Calculation: 418 R Axis:   -8  Text Interpretation: Normal sinus rhythm Septal infarct , age undetermined When compared with ECG of 11-Aug-2023 15:50, No significant change was found Confirmed by Gerard Frederick ([id number]) on 03/05/2024 11:00:46 AM    2D echo 08/29/2022 1. Left ventricular ejection fraction, by estimation, is 60 to 65%. The  left ventricle has normal function. The left ventricle has no regional  wall motion abnormalities. Left ventricular diastolic parameters are  consistent with Grade I diastolic  dysfunction (impaired relaxation).  2. Right ventricular systolic function is normal. The right ventricular  size is normal. Tricuspid regurgitation signal is inadequate for assessing  PA pressure.   3. The mitral valve is normal in structure. No evidence of mitral valve  regurgitation. No  evidence of mitral stenosis.   4. The aortic valve is tricuspid. Aortic valve regurgitation is not  visualized. No aortic stenosis is present.   5. The inferior vena cava is normal in size with greater than 50%  respiratory variability, suggesting right atrial pressure of 3 mmHg.    Lexiscan  MPI 11/05/2019 Pharmacological myocardial perfusion imaging study with no significant  ischemia Normal wall motion, EF estimated at 76% No EKG changes concerning for ischemia at peak stress or in recovery. PVCs noted CT attenuation corrected images with mild aortic atherosclerosis in the arch, unable to exclude mild coronary calcification Low risk scan   2D echo 07/15/2016 - Left ventricle: The cavity size was normal. Systolic function was    normal. The estimated ejection fraction was in the range of 60%    to 65%. Wall motion was normal; there were no regional wall    motion abnormalities. Left ventricular diastolic function    parameters were normal.  - Aortic valve: There was trivial regurgitation.  - Left atrium: The atrium was normal in size.  - Right ventricle: Systolic function was normal.  - Pulmonary arteries: Systolic pressure was within the normal    range.    Lexiscan  Myoview  07/02/2016 Pharmacological myocardial perfusion imaging study with no significant  ischemia Normal wall motion, EF estimated at 79% No EKG changes concerning for ischemia at peak stress or in recovery. Low risk scan Risk Assessment/Calculations         Physical Exam VS:  BP 132/62   Pulse 65   Ht 5' 7 (1.702 m)   Wt 203 lb 6.4 oz (92.3 kg)   SpO2 95%   BMI 31.86 kg/m        Wt Readings from Last 3 Encounters:  03/05/24 203 lb 6.4 oz (92.3 kg)  02/16/24 203 lb 12.8 oz (92.4 kg)  12/10/23 200 lb (90.7 kg)    GEN: Well nourished, well developed in no acute distress NECK: No JVD; No carotid bruits CARDIAC: RRR, no murmurs, rubs, gallops RESPIRATORY:  Clear to auscultation without rales, wheezing or  rhonchi  ABDOMEN: Soft, non-tender, non-distended EXTREMITIES:  No edema; No deformity   ASSESSMENT AND PLAN Primary hypertension with a blood pressure today 132/62.  Blood pressures remain stable.  She has been continued on amlodipine  5 mg daily, carvedilol  6.25 mg twice daily, losartan  100 mg daily.  She did complain of some swelling to her bilateral lower extremities she has been encouraged to take her amlodipine  at bedtime.  She is also been encouraged to continue to monitor her pressures 1 to 2 hours. postmedication administration as well.  Mixed hyperlipidemia with a last LDL began at 69.  She was continued on rosuvastatin  10 mg daily.  Ongoing management per PCP.  Aortic atherosclerosis that was mild to moderate on CT scan.  EKG today reveals sinus rhythm rate of 65 with an old septal infarct with no acute change from prior studies.  She is continued on aspirin  and statin therapy.  Chronic chest pain and chronic shortness of breath which is unchanged over the last several years.  Prior echocardiogram revealed an LVEF of 60-65%, no RWMA, G1 DD, and no valvular abnormalities were noted.  No EKG changes noted today.  No further  ischemic testing needed at this time    Dispo: Patient return to clinic to see MD/APP in 6 months or sooner if needed for further evaluation  Signed, Chriss Redel, NP

## 2024-03-05 NOTE — Patient Instructions (Signed)
 Medication Instructions:  Your physician recommends the following medication changes.  START TAKING: Amlodipine  at bedtime  *If you need a refill on your cardiac medications before your next appointment, please call your pharmacy*  Lab Work: No labs ordered today  If you have labs (blood work) drawn today and your tests are completely normal, you will receive your results only by: MyChart Message (if you have MyChart) OR A paper copy in the mail If you have any lab test that is abnormal or we need to change your treatment, we will call you to review the results.  Testing/Procedures: No test ordered today   Follow-Up: At [facility], you and your health needs are our priority.  As part of our continuing mission to provide you with exceptional heart care, our providers are all part of one team.  This team includes your primary Cardiologist (physician) and Advanced Practice Providers or APPs (Physician Assistants and Nurse Practitioners) who all work together to provide you with the care you need, when you need it.  Your next appointment:   6 month(s)  Provider:   Tylene Lunch, NP

## 2024-03-19 ENCOUNTER — Other Ambulatory Visit: Payer: Self-pay | Admitting: Neurology

## 2024-03-19 DIAGNOSIS — R413 Other amnesia: Secondary | ICD-10-CM | POA: Diagnosis not present

## 2024-03-19 DIAGNOSIS — Z1331 Encounter for screening for depression: Secondary | ICD-10-CM | POA: Diagnosis not present

## 2024-03-19 DIAGNOSIS — G629 Polyneuropathy, unspecified: Secondary | ICD-10-CM | POA: Diagnosis not present

## 2024-03-30 ENCOUNTER — Inpatient Hospital Stay: Admission: RE | Admit: 2024-03-30 | Source: Ambulatory Visit

## 2024-03-31 ENCOUNTER — Ambulatory Visit
Admission: RE | Admit: 2024-03-31 | Discharge: 2024-03-31 | Disposition: A | Discharge status: Home / Self Care | Source: Ambulatory Visit | Attending: Neurology | Admitting: Neurology

## 2024-03-31 DIAGNOSIS — R413 Other amnesia: Secondary | ICD-10-CM

## 2024-05-11 ENCOUNTER — Ambulatory Visit
Admission: RE | Admit: 2024-05-11 | Discharge: 2024-05-11 | Disposition: A | Discharge status: Home / Self Care | Source: Ambulatory Visit

## 2024-05-11 DIAGNOSIS — Z1231 Encounter for screening mammogram for malignant neoplasm of breast: Secondary | ICD-10-CM | POA: Diagnosis not present

## 2024-05-20 DIAGNOSIS — D2261 Melanocytic nevi of right upper limb, including shoulder: Secondary | ICD-10-CM | POA: Diagnosis not present

## 2024-05-20 DIAGNOSIS — D2272 Melanocytic nevi of left lower limb, including hip: Secondary | ICD-10-CM | POA: Diagnosis not present

## 2024-05-20 DIAGNOSIS — D2262 Melanocytic nevi of left upper limb, including shoulder: Secondary | ICD-10-CM | POA: Diagnosis not present

## 2024-05-20 DIAGNOSIS — D225 Melanocytic nevi of trunk: Secondary | ICD-10-CM | POA: Diagnosis not present

## 2024-05-20 DIAGNOSIS — L538 Other specified erythematous conditions: Secondary | ICD-10-CM | POA: Diagnosis not present

## 2024-05-20 DIAGNOSIS — R208 Other disturbances of skin sensation: Secondary | ICD-10-CM | POA: Diagnosis not present

## 2024-05-20 DIAGNOSIS — L570 Actinic keratosis: Secondary | ICD-10-CM | POA: Diagnosis not present

## 2024-05-20 DIAGNOSIS — L820 Inflamed seborrheic keratosis: Secondary | ICD-10-CM | POA: Diagnosis not present

## 2024-06-21 ENCOUNTER — Other Ambulatory Visit: Payer: Self-pay

## 2024-06-21 DIAGNOSIS — G629 Polyneuropathy, unspecified: Secondary | ICD-10-CM

## 2024-06-24 NOTE — Telephone Encounter (Signed)
 1. Neuropathy - PDMP reviewed, refill appropriate.  - pregabalin  (LYRICA ) 150 MG capsule; TAKE 1 CAPSULE(150 MG) BY MOUTH TWICE DAILY  Dispense: 180 capsule; Refill: 1  Gresia Isidoro, MD

## 2024-07-27 DIAGNOSIS — R413 Other amnesia: Secondary | ICD-10-CM | POA: Diagnosis not present

## 2024-07-27 DIAGNOSIS — H9193 Unspecified hearing loss, bilateral: Secondary | ICD-10-CM | POA: Diagnosis not present

## 2024-07-27 DIAGNOSIS — G939 Disorder of brain, unspecified: Secondary | ICD-10-CM | POA: Diagnosis not present

## 2024-07-27 DIAGNOSIS — G629 Polyneuropathy, unspecified: Secondary | ICD-10-CM | POA: Diagnosis not present

## 2024-07-29 ENCOUNTER — Other Ambulatory Visit: Payer: Self-pay

## 2024-07-29 DIAGNOSIS — E782 Mixed hyperlipidemia: Secondary | ICD-10-CM

## 2024-07-29 DIAGNOSIS — F39 Unspecified mood [affective] disorder: Secondary | ICD-10-CM

## 2024-07-29 DIAGNOSIS — K219 Gastro-esophageal reflux disease without esophagitis: Secondary | ICD-10-CM

## 2024-07-29 DIAGNOSIS — I10 Essential (primary) hypertension: Secondary | ICD-10-CM

## 2024-07-29 MED ORDER — AMLODIPINE BESYLATE 5 MG PO TABS: 5.0000 mg | ORAL_TABLET | Freq: Every day | ORAL | 3 refills | Status: DC

## 2024-07-29 MED ORDER — ROSUVASTATIN CALCIUM 10 MG PO TABS: ORAL_TABLET | ORAL | 1 refills | Status: DC

## 2024-07-29 NOTE — Telephone Encounter (Unsigned)
 Copied from CRM #[id number]. Topic: Clinical - Medication Refill >> Jul 29, 2024 10:58 AM Macario HERO wrote: Medication: solifenacin  (VESICARE ) 10 MG tablet [[id number]] rosuvastatin  (CRESTOR ) 10 MG tablet [[id number]] carvedilol  (COREG ) 6.25 MG tablet [[id number]] DULoxetine  (CYMBALTA ) 30 MG capsule [[id number]] omeprazole  (PRILOSEC) 20 MG capsule [[id number]] amLODipine  (NORVASC ) 5 MG tablet [[id number]]  Has the patient contacted their pharmacy? Yes (Agent: If no, request that the patient contact the pharmacy for the refill. If patient does not wish to contact the pharmacy document the reason why and proceed with request.) (Agent: If yes, when and what did the pharmacy advise?)  This is the patient's preferred pharmacy:  [id number] [place], [place] - [address] AT [facility] OF [address] [place] [address] [place] [address] Phone: [phone] Fax: [phone]   Is this the correct pharmacy for this prescription? Yes If no, delete pharmacy and type the correct one.   Has the prescription been filled recently? Yes  Is the patient out of the medication? Yes  Has the patient been seen for an appointment in the last year OR does the patient have an upcoming appointment? Yes  Can we respond through MyChart? Yes  Agent: Please be advised that Rx refills may take up to 3 business days. We ask that you follow-up with your pharmacy.

## 2024-07-29 NOTE — Telephone Encounter (Signed)
 Please forward refill request for Vesicare  to urology Dr. Glendia Elizabeth.   Luke Shade, MD

## 2024-07-29 NOTE — Telephone Encounter (Signed)
 All Rx's pended except 1-- solifenacin  (VESICARE ) 10 MG tablet , as it is a specialist Rx.

## 2024-08-17 ENCOUNTER — Ambulatory Visit

## 2024-08-17 ENCOUNTER — Telehealth: Payer: Self-pay

## 2024-08-17 NOTE — Telephone Encounter (Signed)
 Copied from CRM #[id number]. Topic: Appointments - Scheduling Inquiry for Clinic >> Aug 17, 2024 10:13 AM Burnard DEL wrote: Reason for CRM: Patient missed her appointment today due to being told the wrong location. She was at the hospital for her appointment after someone told her to go there. She had to reschedule ,however provider is booking out until March 2026. She would like to know is there any way she could be squeezed in on providers schedule before March?  Would Dr. Abbey like to see patient virtually?  If not, please let us  know when she would like to see patient.

## 2024-08-17 NOTE — Telephone Encounter (Signed)
 No virtual visit as I am concerned this will be an issue for virtual visit. Okay to schedule her for 4:30 PM office visit whenever next available.   Thank you,  Luke Shade, MD

## 2024-08-23 ENCOUNTER — Ambulatory Visit

## 2024-08-23 VITALS — BP 110/60 | HR 67 | Temp 98.3°F | Ht 67.0 in | Wt 207.0 lb

## 2024-08-23 DIAGNOSIS — I679 Cerebrovascular disease, unspecified: Secondary | ICD-10-CM | POA: Insufficient documentation

## 2024-08-23 DIAGNOSIS — H9193 Unspecified hearing loss, bilateral: Secondary | ICD-10-CM | POA: Diagnosis not present

## 2024-08-23 DIAGNOSIS — E6609 Other obesity due to excess calories: Secondary | ICD-10-CM

## 2024-08-23 DIAGNOSIS — G629 Polyneuropathy, unspecified: Secondary | ICD-10-CM | POA: Diagnosis not present

## 2024-08-23 DIAGNOSIS — I10 Essential (primary) hypertension: Secondary | ICD-10-CM

## 2024-08-23 MED ORDER — AMLODIPINE BESYLATE 2.5 MG PO TABS: 2.5000 mg | ORAL_TABLET | Freq: Every day | ORAL | 1 refills | Status: DC

## 2024-08-23 MED ORDER — PREGABALIN 100 MG PO CAPS: 100.0000 mg | ORAL_CAPSULE | Freq: Every day | ORAL | 0 refills | Status: AC

## 2024-08-23 MED ORDER — PREGABALIN 75 MG PO CAPS: 75.0000 mg | ORAL_CAPSULE | Freq: Every day | ORAL | 1 refills | Status: AC

## 2024-08-23 NOTE — Assessment & Plan Note (Addendum)
 Reduce amlodipine  from 5 mg to 2.5 mg daily  Orders:   amLODipine  (NORVASC ) 2.5 MG tablet; Take 1 tablet (2.5 mg total) by mouth daily.

## 2024-08-23 NOTE — Patient Instructions (Addendum)
-   I am putting in a new referral for audiology (to evaluate into hearing). If you do not get a call stating that they are calling to schedule an appointment for audiology within next couple of weeks please reach out to our clinic.   - Reduce dose of Lyrica  from 150 mg once a day to 100 mg daily for 14 days then 75 mg daily till your next visit with me. By reducing the dose of this medication we are trying to reduce risk of fall, feeling drowsy, tired. You may notice tinglings in your legs when we taper you to lower dose.   - Take 1/2 tablet of Amlodipine  till you run our of current medication. I have sent you a refill for 2.5 mg once daily Amlodipine  that you should continue.   - Please check blood pressure three times a week and write it down. Please bring with you your home BP reading for review. Goal BP is less than 130/80 mmHg.   - Emphasize whole grains, lean proteins, fruits, and vegetables. Limit processed foods and sugary drinks. Aim for 150 minutes of moderate aerobic activity weekly plus strength training twice a week.

## 2024-08-23 NOTE — Assessment & Plan Note (Addendum)
 Referral to audiology made. Printed information provided to the patient on referral and to reach out to our clinic if she doesn't hear from audiology clinic to scheduled an appointment in 2 weeks.  Orders:   Ambulatory referral to Audiology

## 2024-08-23 NOTE — Assessment & Plan Note (Addendum)
 Plan to reduce Lyrica  to mitigate drowsiness and memory issues. Discussed potential for increased numbness or burning sensation with dose reduction. Reduced Lyrica  dose from 150 mg to 100 mg daily for 14 days, then to 75 mg daily until next visit. Monitor for increased numbness or burning sensation. Provided printed information/counseling to the patient. Check B12,folate, CMP, blood type.  Orders:   pregabalin  (LYRICA ) 100 MG capsule; Take 1 capsule (100 mg total) by mouth daily.   pregabalin  (LYRICA ) 75 MG capsule; Take 1 capsule (75 mg total) by mouth daily.   B12 and Folate Panel; Future   Type and screen; Future   Comp Met (CMET); Future

## 2024-08-23 NOTE — Assessment & Plan Note (Signed)
 Management discussed. Injectable weight loss medications not covered by insurance. Discussed lifestyle modifications. Encouraged dietary modifications to reduce sugary foods and increase fresh fruits and vegetables. Encouraged regular physical activity.

## 2024-08-23 NOTE — Assessment & Plan Note (Deleted)
 SABRA

## 2024-08-23 NOTE — Progress Notes (Signed)
 Established Patient Office Visit   Subjective  Patient ID: Monica Larson, female    DOB: 1946-02-02  Age: 78 y.o. MRN: [id number]  Chief Complaint  Patient presents with   Hypertension   Hyperlipidemia   Gastroesophageal Reflux    Discussed the use of AI scribe software for clinical note transcription with the patient, who gave verbal consent to proceed.  History of Present Illness Monica Larson is a 78 year old female with small vessel disease who presents for follow-up on memory concerns and medication management.  - She has been experiencing worsening memory issues, which have been a concern for both her and her husband. Her husband often gets aggravated because she forgets things he has told her multiple times. She recalls being told something was wrong with her brain but did not understand that her memory issues might be related to small vessel disease. She is seeing [facility] neurology, last visit was on 07/27/24. Currently she is taking Alpha Lipoic Acid 600 mg a day and is interested in going down on dose of Lyric to potentially help with brain health. She was referred to audiology clinic during her last neurology visit but has not scheduled an appointment yet. Is requesting a new referral to audiology.   - BP: Currently on Amlodipine  5mg  daily, Losartan  100 mg daily and Coreg  6.25 mg twice a day. Has not been checking BP at home.   - Has neuropathy of b/l lower legs and has been taking Lyrica  150 mg daily. For mood and neuropathy she has been taking Cymbalta  30 mg daily.  - Obesity: She wants to lose weight and has discussed the high cost of weight loss medications not covered by her insurance. Her daughter has had success with such medications.In terms of diet and activity, she was more active in the summer but less so in the winter. She acknowledges not eating mostly healthy foods and wants to return to the gym. She drinks sugary alcoholic beverages occasionally but does not drink  regularly due to a past negative experience with her first husband's drinking.  - She is unaware of her blood type and has not had it documented in her records. She is interested in knowing it as it has been a topic of conversation among her peers.    ROS As per HPI    Objective:     BP 110/60 (BP Location: Left Arm, Patient Position: Sitting, Cuff Size: Normal)   Pulse 67   Temp 98.3 F (36.8 C) (Oral)   Ht 5' 7 (1.702 m)   Wt 207 lb (93.9 kg)   SpO2 94%   BMI 32.42 kg/m      08/23/2024    4:32 PM 02/16/2024   11:13 AM 12/10/2023    9:56 AM  Depression screen PHQ 2/9  Decreased Interest 0 0 0  Down, Depressed, Hopeless 0 0 0  PHQ - 2 Score 0 0 0  Altered sleeping 0 1 0  Tired, decreased energy 0 1 0  Change in appetite 0 1 0  Feeling bad or failure about yourself  0 0 0  Trouble concentrating 0 0 0  Moving slowly or fidgety/restless 0 0 0  Suicidal thoughts 0 0 0  PHQ-9 Score 0 3  0   Difficult doing work/chores Not difficult at all Somewhat difficult Not difficult at all     Data saved with a previous flowsheet row definition      08/23/2024    4:32 PM 02/16/2024  11:14 AM 06/27/2023    9:55 AM 12/10/2022   11:05 AM  GAD 7 : Generalized Anxiety Score  Nervous, Anxious, on Edge 0 0 0 0  Control/stop worrying 0 0 0 0  Worry too much - different things 0 0 0 0  Trouble relaxing 0 0 0 0  Restless 0 0 0 0  Easily annoyed or irritable 0 0 0 0  Afraid - awful might happen 0 0 0 0  Total GAD 7 Score 0 0 0 0  Anxiety Difficulty Not difficult at all Not difficult at all Not difficult at all Not difficult at all      08/23/2024    4:32 PM 02/16/2024   11:13 AM 12/10/2023    9:56 AM  Depression screen PHQ 2/9  Decreased Interest 0 0 0  Down, Depressed, Hopeless 0 0 0  PHQ - 2 Score 0 0 0  Altered sleeping 0 1 0  Tired, decreased energy 0 1 0  Change in appetite 0 1 0  Feeling bad or failure about yourself  0 0 0  Trouble concentrating 0 0 0  Moving slowly or  fidgety/restless 0 0 0  Suicidal thoughts 0 0 0  PHQ-9 Score 0 3  0   Difficult doing work/chores Not difficult at all Somewhat difficult Not difficult at all     Data saved with a previous flowsheet row definition      08/23/2024    4:32 PM 02/16/2024   11:14 AM 06/27/2023    9:55 AM 12/10/2022   11:05 AM  GAD 7 : Generalized Anxiety Score  Nervous, Anxious, on Edge 0 0 0 0  Control/stop worrying 0 0 0 0  Worry too much - different things 0 0 0 0  Trouble relaxing 0 0 0 0  Restless 0 0 0 0  Easily annoyed or irritable 0 0 0 0  Afraid - awful might happen 0 0 0 0  Total GAD 7 Score 0 0 0 0  Anxiety Difficulty Not difficult at all Not difficult at all Not difficult at all Not difficult at all   SDOH Screenings   Food Insecurity: No Food Insecurity (08/17/2024)  Housing: Unknown (08/17/2024)  Transportation Needs: No Transportation Needs (08/17/2024)  Utilities: Not At Risk (12/10/2023)  Alcohol  Screen: Low Risk (12/10/2023)  Depression (PHQ2-9): Low Risk (08/23/2024)  Financial Resource Strain: Low Risk (08/17/2024)  Physical Activity: Unknown (08/17/2024)  Social Connections: Socially Integrated (08/17/2024)  Stress: No Stress Concern Present (08/17/2024)  Tobacco Use: Medium Risk (08/23/2024)  Health Literacy: Inadequate Health Literacy (12/10/2023)     Physical Exam HENT:     Right Ear: Tympanic membrane normal. There is no impacted cerumen.     Left Ear: Tympanic membrane normal. There is no impacted cerumen.     Mouth/Throat:     Mouth: Mucous membranes are moist.  Cardiovascular:     Rate and Rhythm: Normal rate.  Pulmonary:     Effort: Pulmonary effort is normal.     Breath sounds: Normal breath sounds.  Abdominal:     Tenderness: There is no guarding.  Musculoskeletal:     Cervical back: Neck supple.     Right lower leg: No edema.     Left lower leg: No edema.  Lymphadenopathy:     Cervical: No cervical adenopathy.  Skin:    General: Skin is warm.  Neurological:      Mental Status: She is alert and oriented to person, place, and time.  Psychiatric:  Mood and Affect: Mood normal.        No results found for any visits on 08/23/24.  The 10-year ASCVD risk score (Arnett DK, et al., 2019) is: 23.7%     Assessment & Plan:   Assessment & Plan Hearing difficulty of both ears Referral to audiology made. Printed information provided to the patient on referral and to reach out to our clinic if she doesn't hear from audiology clinic to scheduled an appointment in 2 weeks.  Orders:   Ambulatory referral to Audiology  Primary hypertension Reduce amlodipine  from 5 mg to 2.5 mg daily  Orders:   amLODipine  (NORVASC ) 2.5 MG tablet; Take 1 tablet (2.5 mg total) by mouth daily.  Neuropathy Plan to reduce Lyrica  to mitigate drowsiness and memory issues. Discussed potential for increased numbness or burning sensation with dose reduction. Reduced Lyrica  dose from 150 mg to 100 mg daily for 14 days, then to 75 mg daily until next visit. Monitor for increased numbness or burning sensation. Provided printed information/counseling to the patient. Check B12,folate, CMP, blood type.  Orders:   pregabalin  (LYRICA ) 100 MG capsule; Take 1 capsule (100 mg total) by mouth daily.   pregabalin  (LYRICA ) 75 MG capsule; Take 1 capsule (75 mg total) by mouth daily.   B12 and Folate Panel; Future   Type and screen; Future   Comp Met (CMET); Future  Small vessel disease, cerebrovascular Small vessel disease causing cognitive impairment, memory issues, and fall risk. Referred to audiology for hearing evaluation.Continue medications for blood pressure, cholesterol, and blood sugar management. Encouraged activity and cognitive engagement.  Discussed importance of diet, exercise, and avoiding alcohol  for overall health and cognitive function. Encouraged a diet rich in fresh fruits and vegetables, low in processed foods and carbohydrates. Encouraged regular physical activity.  Advised against alcohol  consumption.    Class 1 obesity due to excess calories with serious comorbidity and body mass index (BMI) of 33.0 to 33.9 in adult Management discussed. Injectable weight loss medications not covered by insurance. Discussed lifestyle modifications. Encouraged dietary modifications to reduce sugary foods and increase fresh fruits and vegetables. Encouraged regular physical activity.     I personally spent a total of 40 minutes in the care of the patient today including preparing to see the patient, counseling and educating, placing orders, documenting clinical information in the EHR, independently interpreting results, and communicating results.   Return in about 3 months (around 11/21/2024) for Non-fasting lab at patient's convinience, f/u with Dr. Abbey in 3 months .   Luke Abbey, MD

## 2024-08-23 NOTE — Assessment & Plan Note (Signed)
 Small vessel disease causing cognitive impairment, memory issues, and fall risk. Referred to audiology for hearing evaluation.Continue medications for blood pressure, cholesterol, and blood sugar management. Encouraged activity and cognitive engagement.  Discussed importance of diet, exercise, and avoiding alcohol  for overall health and cognitive function. Encouraged a diet rich in fresh fruits and vegetables, low in processed foods and carbohydrates. Encouraged regular physical activity. Advised against alcohol  consumption.

## 2024-08-26 ENCOUNTER — Other Ambulatory Visit

## 2024-08-26 DIAGNOSIS — G629 Polyneuropathy, unspecified: Secondary | ICD-10-CM

## 2024-08-27 ENCOUNTER — Ambulatory Visit: Payer: Self-pay

## 2024-08-27 LAB — COMPREHENSIVE METABOLIC PANEL WITH GFR
ALT: 12 U/L (ref 3–35)
AST: 14 U/L (ref 5–37)
Albumin: 4 g/dL (ref 3.5–5.2)
Alkaline Phosphatase: 69 U/L (ref 39–117)
BUN: 16 mg/dL (ref 6–23)
CO2: 30 meq/L (ref 19–32)
Calcium: 9.2 mg/dL (ref 8.4–10.5)
Chloride: 104 meq/L (ref 96–112)
Creatinine, Ser: 0.89 mg/dL (ref 0.40–1.20)
GFR: 61.66 mL/min
Glucose, Bld: 127 mg/dL — ABNORMAL HIGH (ref 70–99)
Potassium: 4.6 meq/L (ref 3.5–5.1)
Sodium: 141 meq/L (ref 135–145)
Total Bilirubin: 0.5 mg/dL (ref 0.2–1.2)
Total Protein: 6.5 g/dL (ref 6.0–8.3)

## 2024-08-27 LAB — B12 AND FOLATE PANEL
Folate: 22.7 ng/mL
Vitamin B-12: 468 pg/mL (ref 211–911)

## 2024-08-27 NOTE — Progress Notes (Signed)
 Patient updated on stable lab results. Reviewed medical record, and lab from 08/12/2014 at [facility]/Pre procedure screening showed: Blood type: A positive. Patient made aware of this as well.   Luke Shade, MD

## 2024-09-16 ENCOUNTER — Ambulatory Visit: Payer: Self-pay | Admitting: Audiology

## 2024-09-28 ENCOUNTER — Ambulatory Visit: Payer: Self-pay | Attending: Cardiology | Admitting: Cardiology

## 2024-09-28 ENCOUNTER — Encounter: Payer: Self-pay | Admitting: Cardiology

## 2024-09-28 VITALS — BP 128/74 | HR 69 | Ht 67.0 in | Wt 205.4 lb

## 2024-09-28 DIAGNOSIS — R079 Chest pain, unspecified: Secondary | ICD-10-CM | POA: Diagnosis not present

## 2024-09-28 DIAGNOSIS — E782 Mixed hyperlipidemia: Secondary | ICD-10-CM

## 2024-09-28 DIAGNOSIS — I10 Essential (primary) hypertension: Secondary | ICD-10-CM | POA: Diagnosis not present

## 2024-09-28 DIAGNOSIS — I700 Atherosclerosis of aorta: Secondary | ICD-10-CM | POA: Diagnosis not present

## 2024-09-28 DIAGNOSIS — G8929 Other chronic pain: Secondary | ICD-10-CM | POA: Diagnosis not present

## 2024-09-28 DIAGNOSIS — R0602 Shortness of breath: Secondary | ICD-10-CM | POA: Diagnosis not present

## 2024-09-28 MED ORDER — AMLODIPINE BESYLATE 5 MG PO TABS: 5.0000 mg | ORAL_TABLET | Freq: Every day | ORAL | 3 refills | Status: AC

## 2024-09-28 MED ORDER — LOSARTAN POTASSIUM 100 MG PO TABS: ORAL_TABLET | ORAL | 1 refills | Status: AC

## 2024-09-28 MED ORDER — ROSUVASTATIN CALCIUM 10 MG PO TABS: ORAL_TABLET | ORAL | 1 refills | Status: AC

## 2024-09-28 MED ORDER — CARVEDILOL 6.25 MG PO TABS: ORAL_TABLET | ORAL | 1 refills | Status: AC

## 2024-09-28 MED ORDER — AMLODIPINE BESYLATE 2.5 MG PO TABS: 2.5000 mg | ORAL_TABLET | Freq: Every day | ORAL | 1 refills | Status: AC

## 2024-09-28 NOTE — Patient Instructions (Addendum)
 Medication Instructions:   Your physician recommends that you continue on your current medications as directed. Please refer to the Current Medication list given to you today.    *If you need a refill on your cardiac medications before your next appointment, please call your pharmacy*  Lab Work:  None ordered at this time   If you have labs (blood work) drawn today and your tests are completely normal, you will receive your results only by:  MyChart Message (if you have MyChart) OR  A paper copy in the mail If you have any lab test that is abnormal or we need to change your treatment, we will call you to review the results.  Testing/Procedures:  None ordered at this time   Referrals:  None ordered at this time   Follow-Up:  At [facility], you and your health needs are our priority.  As part of our continuing mission to provide you with exceptional heart care, our providers are all part of one team.  This team includes your primary Cardiologist (physician) and Advanced Practice Providers or APPs (Physician Assistants and Nurse Practitioners) who all work together to provide you with the care you need, when you need it.  Your next appointment:   5 - 6 month(s)  Provider:    You may see or one of the following Advanced Practice Providers on your designated Care Team:   Lonni Meager, NP Lesley Maffucci, PA-C Bernardino Bring, PA-C Cadence [place], PA-C Tylene Lunch, NP Barnie Hila, NP    We recommend signing up for the patient portal called MyChart.  Sign up information is provided on this After Visit Summary.  MyChart is used to connect with patients for Virtual Visits (Telemedicine).  Patients are able to view lab/test results, encounter notes, upcoming appointments, etc.  Non-urgent messages can be sent to your provider as well.   To learn more about what you can do with MyChart, go to [email or web address].

## 2024-09-28 NOTE — Progress Notes (Signed)
 " Cardiology Office Note   Date:  09/28/2024  ID:  Monica Larson, Monica Larson July 09, 1946, MRN [id number] PCP: Abbey Bruckner, MD  [facility] Providers Cardiologist:  Evalene Lunger, MD Cardiology APP:  Gerard Frederick, NP     History of Present Illness Monica Larson is a 79 y.o. female with past medical history of anxiety, former smoker, obesity, hyperlipidemia, hypertension, GERD, chronic left-sided chest pain, who presents today for follow-up.   Patient with chronic left-sided chest pain underwent Lexiscan  Myoview  in 2017 which revealed no significant ischemia, normal wall motion, EF estimated at 79%.  Echocardiogram revealed an LVEF of 60 to 65%, no RWMA, and trivial aortic regurgitation.  Carotid duplex in 2011 revealed no evidence of hemodynamically significant carotid stenosis.  Continued chest discomfort and LAD repeat Myoview  in 2021 which continue to show no significant ischemia, normal wall motion, considered low risk scan.  Repeat echocardiogram completed in 08/2022 showed LVEF of 60 to 65%, no RWMA, G1 DD, without valvular abnormalities.  She was seen in clinic 07/2022 at that time doing well.  She had some shortness of breath and dyspnea on exertion without any chest discomfort dizziness lightheadedness.  She was scheduled for knee replacement on 08/2022.  She was scheduled for an echocardiogram and no further changes were made to her medications.  She was hospitalized 08/2022 and underwent left total knee arthroplasty.There were no acute complications postoperatively.  She was advised to stop taking her aspirin  81 mg postoperatively.  She was considered stable for discharge and discharged on 09/04/2022.  She was last seen in clinic 08/11/2023 and was doing well for the cardiac perspective.  She continued chronic chest discomfort but only when she was upset and it was unchanged.  Chronic shortness of breath was unchanged.  She stated that she had upcoming right knee replacement in February  2025.  Continues to suffer from neuropathy had been continued on Lyrica  previously.  There were no medication changes that were made and no further testing that was ordered.   She was last seen in clinic 03/05/2024 stating she had been doing overall well from a cardiac perspective.  She had chronic chest discomfort only when she was send chronic shortness of breath was unchanged.  She previous had left knee replacement in December 2023.  There were no medication changes that were made and no further testing that was ordered at that time.   She returns to clinic today stating overall she has been doing well for cardiac perspective.  She continues to have her chronic chest discomfort in which she gets upset.  She has chronic shortness of breath that is unchanged.  Occasional swelling to her bilateral lower extremities that she continues to suffer from neuropathy.  She is questioning if there are other medications including gabapentin  and Lyrica  that are available to her.  She states she has been compliant with her current medication regimen without undue side effects.  Denies any recent hospitalizations or visits to the emergency department.  ROS: 10 point review of system has been reviewed and considered negative except was been listed in the HPI  Studies Reviewed EKG Interpretation Date/Time:  Tuesday September 28 2024 10:51:34 EST Ventricular Rate:  69 PR Interval:  180 QRS Duration:  92 QT Interval:  392 QTC Calculation: 420 R Axis:   -31  Text Interpretation: Normal sinus rhythm Left axis deviation When compared with ECG of 05-Mar-2024 10:43, Confirmed by Gerard Frederick ([id number]) on 09/28/2024 10:58:23 AM    2D echo  08/29/2022 1. Left ventricular ejection fraction, by estimation, is 60 to 65%. The  left ventricle has normal function. The left ventricle has no regional  wall motion abnormalities. Left ventricular diastolic parameters are  consistent with Grade I diastolic  dysfunction (impaired  relaxation).   2. Right ventricular systolic function is normal. The right ventricular  size is normal. Tricuspid regurgitation signal is inadequate for assessing  PA pressure.   3. The mitral valve is normal in structure. No evidence of mitral valve  regurgitation. No evidence of mitral stenosis.   4. The aortic valve is tricuspid. Aortic valve regurgitation is not  visualized. No aortic stenosis is present.   5. The inferior vena cava is normal in size with greater than 50%  respiratory variability, suggesting right atrial pressure of 3 mmHg.    Lexiscan  MPI 11/05/2019 Pharmacological myocardial perfusion imaging study with no significant  ischemia Normal wall motion, EF estimated at 76% No EKG changes concerning for ischemia at peak stress or in recovery. PVCs noted CT attenuation corrected images with mild aortic atherosclerosis in the arch, unable to exclude mild coronary calcification Low risk scan   2D echo 07/15/2016 - Left ventricle: The cavity size was normal. Systolic function was    normal. The estimated ejection fraction was in the range of 60%    to 65%. Wall motion was normal; there were no regional wall    motion abnormalities. Left ventricular diastolic function    parameters were normal.  - Aortic valve: There was trivial regurgitation.  - Left atrium: The atrium was normal in size.  - Right ventricle: Systolic function was normal.  - Pulmonary arteries: Systolic pressure was within the normal    range.    Lexiscan  Myoview  07/02/2016 Pharmacological myocardial perfusion imaging study with no significant  ischemia Normal wall motion, EF estimated at 79% No EKG changes concerning for ischemia at peak stress or in recovery. Low risk scan  Risk Assessment/Calculations           Physical Exam VS:  BP 128/74 (BP Location: Left Arm, Patient Position: Sitting, Cuff Size: Normal)   Pulse 69   Ht 5' 7 (1.702 m)   Wt 205 lb 6 oz (93.2 kg)   SpO2 97%   BMI 32.17  kg/m        Wt Readings from Last 3 Encounters:  09/28/24 205 lb 6 oz (93.2 kg)  08/23/24 207 lb (93.9 kg)  03/05/24 203 lb 6.4 oz (92.3 kg)    GEN: Well nourished, well developed in no acute distress NECK: No JVD; No carotid bruits CARDIAC: RRR, no murmurs, rubs, gallops RESPIRATORY:  Clear to auscultation without rales, wheezing or rhonchi  ABDOMEN: Soft, non-tender, non-distended EXTREMITIES:  No edema; No deformity   ASSESSMENT AND PLAN Primary hypertension with a blood pressure today 128/74.  Blood pressures have remained stable.  She is continued on amlodipine  5 mg in the a.m. 2.5 mg in p.m., carvedilol  625 mg twice daily, losartan  100 mg daily.  She has been encouraged to continue to monitor pressures 1 to 2 hours postmedication administration as well.  She had previously complained of the swelling with increasing her amlodipine .  She was advised to take her higher dose amlodipine  at bedtime.  Mixed hyperlipidemia with a last LDL was 69.  At she was continued on rosuvastatin  10 mg daily.  Ongoing management per PCP.  Aortic atherosclerosis with mild to moderate calcification noted on CT scan.  EKG today reveals sinus rhythm with rate  of 69 with left axis deviation no acute ischemic changes noted.  She has been continued on aspirin  81 mg daily and rosuvastatin  10 mg daily for primary prevention.  No further ischemic evaluation needed at this time.  Chronic chest pain with shortness of breath unchanged over the last several years.  Prior echocardiogram revealed an LVEF of 60 to 65%, no RWMA, G1 DD, no valvular abnormalities.  No EKG changes noted today no further ischemic evaluation needed at this time.       Dispo: Patient to return to clinic to see MD/APP in 6 months or sooner if needed for further evaluation.  Signed, Efrem Pitstick, NP   "

## 2024-10-14 ENCOUNTER — Telehealth: Payer: Self-pay

## 2024-10-14 NOTE — Telephone Encounter (Signed)
 Left VM and MyChart message to let pt know appt on 3/26 needed to be moved from 11 a.m. to 330 p.m. due to schedule change.   E2C2, please help pt reschedule if this time change does not work for them -kh

## 2024-11-25 ENCOUNTER — Ambulatory Visit

## 2024-12-02 ENCOUNTER — Ambulatory Visit

## 2024-12-06 ENCOUNTER — Ambulatory Visit: Admitting: Urology

## 2024-12-14 ENCOUNTER — Ambulatory Visit

## 2025-03-03 ENCOUNTER — Ambulatory Visit: Admitting: Cardiology
